# Patient Record
Sex: Female | Born: 1990 | Race: White | Hispanic: No | Smoking: Never smoker
Health system: Southern US, Community
[De-identification: ages and names within clinical notes are randomized; demographics above are authoritative.]

## PROBLEM LIST (undated history)

## (undated) DIAGNOSIS — U071 COVID-19: Secondary | ICD-10-CM

## (undated) DIAGNOSIS — Z8639 Personal history of other endocrine, nutritional and metabolic disease: Secondary | ICD-10-CM

## (undated) DIAGNOSIS — M1990 Unspecified osteoarthritis, unspecified site: Secondary | ICD-10-CM

## (undated) DIAGNOSIS — M069 Rheumatoid arthritis, unspecified: Secondary | ICD-10-CM

## (undated) DIAGNOSIS — Z8742 Personal history of other diseases of the female genital tract: Secondary | ICD-10-CM

## (undated) DIAGNOSIS — F419 Anxiety disorder, unspecified: Secondary | ICD-10-CM

## (undated) DIAGNOSIS — Z8659 Personal history of other mental and behavioral disorders: Secondary | ICD-10-CM

## (undated) DIAGNOSIS — F332 Major depressive disorder, recurrent severe without psychotic features: Secondary | ICD-10-CM

## (undated) HISTORY — PX: BUNIONECTOMY: SHX129

## (undated) HISTORY — DX: Personal history of other mental and behavioral disorders: Z86.59

## (undated) HISTORY — DX: Personal history of other diseases of the female genital tract: Z87.42

## (undated) HISTORY — DX: Personal history of other endocrine, nutritional and metabolic disease: Z86.39

## (undated) HISTORY — DX: Unspecified osteoarthritis, unspecified site: M19.90

## (undated) HISTORY — DX: Rheumatoid arthritis, unspecified: M06.9

## (undated) HISTORY — DX: Anxiety disorder, unspecified: F41.9

---

## 2016-04-09 ENCOUNTER — Encounter: Payer: Self-pay | Admitting: Rheumatology

## 2016-04-19 ENCOUNTER — Telehealth: Payer: Self-pay | Admitting: Rheumatology

## 2016-04-19 NOTE — Telephone Encounter (Signed)
Labs received from [facility] WNL, called patient to advise, sent for scanning since they are [facility] and I can not pull these up

## 2016-04-19 NOTE — Telephone Encounter (Signed)
Left message for her, I do not have the lab results, I have also called [facility]

## 2016-04-19 NOTE — Telephone Encounter (Signed)
Pt called about lab results from [facility] visit on 04/09/16.

## 2016-05-05 ENCOUNTER — Other Ambulatory Visit: Payer: Self-pay | Admitting: Rheumatology

## 2016-05-05 ENCOUNTER — Ambulatory Visit: Payer: Self-pay | Admitting: Rheumatology

## 2016-05-05 ENCOUNTER — Telehealth: Payer: Self-pay | Admitting: Radiology

## 2016-05-05 MED ORDER — METHOTREXATE SODIUM CHEMO INJECTION 50 MG/2ML: 20.0000 mg | INTRAMUSCULAR | 0 refills | Status: DC

## 2016-05-05 NOTE — Telephone Encounter (Signed)
Last visit 02/03/16 Next visit not scheduled, message sent Labs  12/14/15  labs past due for MTX  Will call patient

## 2016-05-05 NOTE — Telephone Encounter (Signed)
Patient was denied a refill for MTX; I scheduled her an appointment in January. She was called on 04/19/16 by our office stating her labs were normal Pt would [facility] [place].

## 2016-05-05 NOTE — Telephone Encounter (Signed)
Thank you, I have now sent the labs to be scanned  [facility] to refill per Dr Corliss Skains

## 2016-05-05 NOTE — Telephone Encounter (Signed)
Pt states I called her on 04/19/16 with normal lab results/ this was our go live date in epic  I can not locate labs. Will have to call [facility] then can send for scanning

## 2016-05-26 ENCOUNTER — Other Ambulatory Visit: Payer: Self-pay | Admitting: Rheumatology

## 2016-05-26 NOTE — Telephone Encounter (Signed)
I have called patient to discuss.  Left message for her to call me back

## 2016-05-26 NOTE — Telephone Encounter (Signed)
Will you review? 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

## 2016-05-26 NOTE — Telephone Encounter (Signed)
That the Celebrex use was only for tendinitis. She should discontinue the medication or use when necessary only

## 2016-05-27 ENCOUNTER — Other Ambulatory Visit: Payer: Self-pay | Admitting: Rheumatology

## 2016-05-27 NOTE — Telephone Encounter (Signed)
Patient returned Amy's phone call. Patient states she is at work and can talk at 66 or 3. Thank you.

## 2016-05-27 NOTE — Telephone Encounter (Signed)
Labs WNL 04/19/16 Last visit  02/03/16 Next visit 07/12/16

## 2016-05-27 NOTE — Telephone Encounter (Signed)
I spoke to patient and she does only when she needs it. She is aware this with MTX long term can affect kidney function. Ok to refill per Dr Corliss Skains

## 2016-06-01 ENCOUNTER — Other Ambulatory Visit: Payer: Self-pay | Admitting: Rheumatology

## 2016-06-02 ENCOUNTER — Telehealth: Payer: Self-pay | Admitting: Rheumatology

## 2016-06-02 NOTE — Telephone Encounter (Signed)
Contacted [facility] and provided new fax number and they are to fax labs.

## 2016-06-02 NOTE — Telephone Encounter (Signed)
Patient states she had her labs done at [facility] on 10/13 and they have tried to fax to [place]. [facility] states patient can't give them our new fax, we have to call them, [phone]

## 2016-06-03 NOTE — Telephone Encounter (Signed)
ok 

## 2016-06-03 NOTE — Telephone Encounter (Signed)
Last Visit: 02/03/16 Next Visit: 07/12/16 Labs: 04/09/16 WNL except elevated glucose  Okay to refill MTX?

## 2016-07-12 ENCOUNTER — Ambulatory Visit: Payer: Self-pay | Admitting: Rheumatology

## 2016-07-20 ENCOUNTER — Ambulatory Visit: Payer: Self-pay | Admitting: Rheumatology

## 2016-07-29 ENCOUNTER — Ambulatory Visit: Payer: Self-pay | Admitting: Rheumatology

## 2016-08-11 ENCOUNTER — Telehealth: Payer: Self-pay | Admitting: Rheumatology

## 2016-08-11 ENCOUNTER — Other Ambulatory Visit: Payer: Self-pay | Admitting: *Deleted

## 2016-08-11 ENCOUNTER — Other Ambulatory Visit: Payer: Self-pay | Admitting: Rheumatology

## 2016-08-11 DIAGNOSIS — Z79899 Other long term (current) drug therapy: Secondary | ICD-10-CM

## 2016-08-11 DIAGNOSIS — Z9225 Personal history of immunosupression therapy: Secondary | ICD-10-CM

## 2016-08-11 NOTE — Telephone Encounter (Addendum)
Last Visit: 02/03/16 Next Visit: 09/01/16 Labs: 04/09/16 Elevated Glucose TB Gold: 08/18/15 Neg  Left message for patient requesting updated labs. Lab order has been placed for patient to update lab.  Okay to refill 30 day supply Humira?

## 2016-08-11 NOTE — Telephone Encounter (Signed)
Left message to advise patient lab order has been placed and released. Patient advised to contact the office with fax number if lab is unable to see the order.

## 2016-08-11 NOTE — Telephone Encounter (Signed)
Patient called and stated that she needs a new lab order for [facility] in [place] so that she can get labs done up there.  [phone].  Thank you.

## 2016-08-12 NOTE — Telephone Encounter (Signed)
Since labs are past due; we can only give 30 day supply;  At f-u in march 7, we can get labs and give 90 day supply

## 2016-08-14 LAB — CBC WITH DIFFERENTIAL/PLATELET
BASOS ABS: 0 10*3/uL (ref 0.0–0.2)
Basos: 0 %
EOS (ABSOLUTE): 0 10*3/uL (ref 0.0–0.4)
Eos: 0 %
Hematocrit: 35.8 % (ref 34.0–46.6)
Hemoglobin: 12 g/dL (ref 11.1–15.9)
IMMATURE GRANS (ABS): 0 10*3/uL (ref 0.0–0.1)
Immature Granulocytes: 0 %
LYMPHS: 19 %
Lymphocytes Absolute: 1.4 10*3/uL (ref 0.7–3.1)
MCH: 30.5 pg (ref 26.6–33.0)
MCHC: 33.5 g/dL (ref 31.5–35.7)
MCV: 91 fL (ref 79–97)
Monocytes Absolute: 0.8 10*3/uL (ref 0.1–0.9)
Monocytes: 11 %
NEUTROS ABS: 5 10*3/uL (ref 1.4–7.0)
NEUTROS PCT: 70 %
Platelets: 304 10*3/uL (ref 150–379)
RBC: 3.94 x10E6/uL (ref 3.77–5.28)
RDW: 13.3 % (ref 12.3–15.4)
WBC: 7.2 10*3/uL (ref 3.4–10.8)

## 2016-08-15 ENCOUNTER — Other Ambulatory Visit: Payer: Self-pay | Admitting: Rheumatology

## 2016-08-16 ENCOUNTER — Telehealth: Payer: Self-pay | Admitting: Rheumatology

## 2016-08-16 NOTE — Telephone Encounter (Signed)
Patient called and wanted to know the results of her lab work.  [phone].  Thank you.

## 2016-08-16 NOTE — Telephone Encounter (Signed)
Last Visit: 02/03/16 Next Visit: 09/01/16 Labs: 08/13/16 WNL  Okay to refill Celebrex?

## 2016-08-16 NOTE — Progress Notes (Signed)
Informed patient CBC with differential is normalTB gold

## 2016-08-17 ENCOUNTER — Other Ambulatory Visit: Payer: Self-pay | Admitting: *Deleted

## 2016-08-17 DIAGNOSIS — Z79899 Other long term (current) drug therapy: Secondary | ICD-10-CM

## 2016-08-17 LAB — QUANTIFERON IN TUBE
QFT TB AG MINUS NIL VALUE: 0.01 [IU]/mL
QUANTIFERON MITOGEN VALUE: 6.42 IU/mL
QUANTIFERON TB AG VALUE: 0.03 [IU]/mL
QUANTIFERON TB GOLD: NEGATIVE
Quantiferon Nil Value: 0.02 IU/mL

## 2016-08-17 LAB — QUANTIFERON TB GOLD ASSAY (BLOOD)

## 2016-08-17 NOTE — Telephone Encounter (Signed)
Patient advised of lab results

## 2016-09-01 ENCOUNTER — Ambulatory Visit: Payer: Self-pay | Admitting: Rheumatology

## 2016-09-08 ENCOUNTER — Other Ambulatory Visit (INDEPENDENT_AMBULATORY_CARE_PROVIDER_SITE_OTHER): Payer: Self-pay | Admitting: *Deleted

## 2016-09-08 DIAGNOSIS — Z79899 Other long term (current) drug therapy: Secondary | ICD-10-CM

## 2016-09-08 LAB — COMPLETE METABOLIC PANEL WITH GFR
ALT: 19 U/L (ref 6–29)
AST: 18 U/L (ref 10–30)
Albumin: 4.1 g/dL (ref 3.6–5.1)
Alkaline Phosphatase: 62 U/L (ref 33–115)
BUN: 11 mg/dL (ref 7–25)
CHLORIDE: 102 mmol/L (ref 98–110)
CO2: 28 mmol/L (ref 20–31)
CREATININE: 0.67 mg/dL (ref 0.50–1.10)
Calcium: 9.6 mg/dL (ref 8.6–10.2)
GFR, Est Non African American: >89 mL/min (ref >=60)
Glucose, Bld: 97 mg/dL (ref 65–99)
Potassium: 4.5 mmol/L (ref 3.5–5.3)
Sodium: 138 mmol/L (ref 135–146)
Total Bilirubin: 0.3 mg/dL (ref 0.2–1.2)
Total Protein: 7.6 g/dL (ref 6.1–8.1)

## 2016-09-09 NOTE — Progress Notes (Signed)
Normal CMP

## 2016-09-13 ENCOUNTER — Other Ambulatory Visit: Payer: Self-pay | Admitting: Rheumatology

## 2016-09-13 ENCOUNTER — Encounter: Payer: Self-pay | Admitting: *Deleted

## 2016-09-13 ENCOUNTER — Ambulatory Visit (INDEPENDENT_AMBULATORY_CARE_PROVIDER_SITE_OTHER): Payer: BLUE CROSS/BLUE SHIELD | Admitting: Rheumatology

## 2016-09-13 ENCOUNTER — Telehealth: Payer: Self-pay | Admitting: Rheumatology

## 2016-09-13 DIAGNOSIS — M65341 Trigger finger, right ring finger: Secondary | ICD-10-CM | POA: Diagnosis not present

## 2016-09-13 MED ORDER — TRIAMCINOLONE ACETONIDE 40 MG/ML IJ SUSP
10.0000 mg | INTRAMUSCULAR | Status: AC | PRN
  Administered 2016-09-13: 10 mg

## 2016-09-13 MED ORDER — LIDOCAINE HCL 1 % IJ SOLN
0.5000 mL | INTRAMUSCULAR | Status: AC | PRN
  Administered 2016-09-13: .5 mL

## 2016-09-13 NOTE — Progress Notes (Signed)
   Procedure Note  Patient: Shannon David             Date of Birth: September 15, 1990           MRN: [id number]             Visit Date: 09/13/2016  Procedures: Visit Diagnoses: Right fourth trigger finger  Hand/UE Inj Date/Time: 09/13/2016 12:07 PM Performed by: Pollyann Savoy Authorized by: Pollyann Savoy   Consent Given by:  Patient Site marked: the procedure site was marked   Timeout: prior to procedure the correct patient, procedure, and site was verified   Indications:  Therapeutic and tendon swelling Condition: trigger finger   Location:  Ring finger Site:  R ring A1 Prep: patient was prepped and draped in usual sterile fashion   Needle Size:  27 G Approach:  Volar Ultrasound Guidance: Yes   Medications:  0.5 mL lidocaine 1 %; 10 mg triamcinolone acetonide 40 MG/ML Aspirate amount (mL):  0 Patient tolerance:  Patient tolerated the procedure well with no immediate complications  Her right fourth finger was splinted. She was also given a work excuse to not to use her right hand for the next 4 days.   Pollyann Savoy, MD, Aileen Pilot

## 2016-09-13 NOTE — Telephone Encounter (Signed)
Okay to schedule patient for 10:30 this morning

## 2016-09-13 NOTE — Telephone Encounter (Signed)
Last Visit: 02/03/16 Next Visit: 09/22/16 Labs: 08/13/16 WNL TB Gold: 08/13/16 Neg  Okay to refill Humira?

## 2016-09-13 NOTE — Telephone Encounter (Signed)
I called the patient back and she is coming in at 10:30.

## 2016-09-13 NOTE — Telephone Encounter (Signed)
Patient called wanting to get into Dr. Fatima Sanger schedule for trigger finger of her right ring finger.  [phone].  Thank you.

## 2016-09-21 DIAGNOSIS — Z8639 Personal history of other endocrine, nutritional and metabolic disease: Secondary | ICD-10-CM | POA: Insufficient documentation

## 2016-09-21 DIAGNOSIS — Z79899 Other long term (current) drug therapy: Secondary | ICD-10-CM | POA: Insufficient documentation

## 2016-09-21 DIAGNOSIS — Z8659 Personal history of other mental and behavioral disorders: Secondary | ICD-10-CM | POA: Insufficient documentation

## 2016-09-21 DIAGNOSIS — M0579 Rheumatoid arthritis with rheumatoid factor of multiple sites without organ or systems involvement: Secondary | ICD-10-CM | POA: Insufficient documentation

## 2016-09-21 NOTE — Progress Notes (Signed)
Office Visit Note  Patient: Shannon David             Date of Birth: 1990/10/16           MRN: [id number]             PCP: Otila Back Referring: No ref. provider found Visit Date: 09/22/2016 Occupation: @GUAROCC @    Subjective:  Follow-up   History of Present Illness: Khanya Colunga is a 26 y.o. female   History of rheumatoid arthritis. Recently seen for trigger finger on the right hand and was injected successfully by Dr. Corliss Skains using ultrasound guidance. Patient reports that she is doing really well. She did follow aftercare advice and has done well.  Patient currently is having a lot of nausea after she takes her methotrexate.  She complains that she has flulike symptoms and last for multiple days. In the past this was not an issue. She's taking 0.8 ML's of injectable methotrexate once a week on Fridays. Folic acid 2 mg daily.  Also taking Humira every other week.  Her joints are doing well overall except her right second MCP always has been a source of pain and problems. Currently her feet are hurting and at times they cramp and at times the toes are curling up. She does have a podiatrist who gave her an insert several years ago but it was not effective and so patient ended up throwing it away.  We discussed other measures and patient is agreeable to try and anti-emetics. If that is not successful, we have discussed decreasing the dosage of her methotrexate. Currently we don't want to do that, she has aggressive rheumatoid arthritis that needs tight control.  Activities of Daily Living:  Patient reports morning stiffness for 30 minutes.   Patient Reports nocturnal pain.  Difficulty dressing/grooming: Denies Difficulty climbing stairs: Reports Difficulty getting out of chair: Denies Difficulty using hands for taps, buttons, cutlery, and/or writing: Reports   Review of Systems  Constitutional: Negative for fatigue.  HENT: Negative for mouth sores and  mouth dryness.   Eyes: Negative for dryness.  Respiratory: Negative for shortness of breath.   Gastrointestinal: Negative for constipation and diarrhea.  Musculoskeletal: Negative for myalgias and myalgias.  Skin: Negative for sensitivity to sunlight.  Psychiatric/Behavioral: Negative for decreased concentration and sleep disturbance.    PMFS History:  Patient Active Problem List   Diagnosis Date Noted  . High risk medication use 09/21/2016  . Rheumatoid arthritis involving multiple sites with positive rheumatoid factor (HCC) 09/21/2016  . History of hypothyroidism 09/21/2016  . History of depression 09/21/2016    Past Medical History:  Diagnosis Date  . Arthritis     No family history on file. Past Surgical History:  Procedure Laterality Date  . BUNIONECTOMY Right    Social History   Social History Narrative  . No narrative on file     Objective: Vital Signs: BP 120/80   Pulse 80   Resp 14   Ht 5\' 3"  (1.6 m)   Wt 220 lb (99.8 kg)   LMP 09/21/2016   BMI 38.97 kg/m    Physical Exam  Constitutional: She is oriented to person, place, and time. She appears well-developed and well-nourished.  HENT:  Head: Normocephalic and atraumatic.  Eyes: EOM are normal. Pupils are equal, round, and reactive to light.  Cardiovascular: Normal rate, regular rhythm and normal heart sounds.  Exam reveals no gallop and no friction rub.   No murmur heard. Pulmonary/Chest: Effort normal and breath  sounds normal. She has no wheezes. She has no rales.  Abdominal: Soft. Bowel sounds are normal. She exhibits no distension. There is no tenderness. There is no guarding. No hernia.  Musculoskeletal: Normal range of motion. She exhibits deformity (ulnar deviation R>L MCP's.). She exhibits no edema or tenderness.  Lymphadenopathy:    She has no cervical adenopathy.  Neurological: She is alert and oriented to person, place, and time. Coordination normal.  Skin: Skin is warm and dry. Capillary  refill takes less than 2 seconds. No rash noted.  Psychiatric: She has a normal mood and affect. Her behavior is normal.  Nursing note and vitals reviewed.    Musculoskeletal Exam:  Full range of motion of all joints Grip strength is equal and strong bilaterally Fibromyalgia tender points are all absent  CDAI Exam: CDAI Homunculus Exam:   Tenderness:  Right hand: 2nd MCP  Swelling:  Right hand: 2nd MCP  Joint Counts:  CDAI Tender Joint count: 1 CDAI Swollen Joint count: 1  Global Assessments:  Patient Global Assessment: 5 Provider Global Assessment: 5  CDAI Calculated Score: 12 Significant ulnar deviation of the right greater than the left hand. Ongoing synovial thickening of the right second MCP joint.  Evaluation of bilateral feet reveal early hammertoe and right greater than left. She has old surgical scar at the medial aspect of her right great toe at the MTP joint.   Investigation: Findings:  June 2017:  CBC was normal, comprehensive metabolic panel was normal, RAPID 3 was 1.0, which was near remission  synovial thickening.  And mild deviation of bilateral hands, left fifth MCP shows some synovial thickening  TB-Gold was negative in February of 2017  Orders Only on 09/08/2016  Component Date Value Ref Range Status  . Sodium 09/08/2016 138  135 - 146 mmol/L Final  . Potassium 09/08/2016 4.5  3.5 - 5.3 mmol/L Final  . Chloride 09/08/2016 102  98 - 110 mmol/L Final  . CO2 09/08/2016 28  20 - 31 mmol/L Final  . Glucose, Bld 09/08/2016 97  65 - 99 mg/dL Final  . BUN 20/08/7942 11  7 - 25 mg/dL Final  . Creat 46/19/0122 0.67  0.50 - 1.10 mg/dL Final  . Total Bilirubin 09/08/2016 0.3  0.2 - 1.2 mg/dL Final  . Alkaline Phosphatase 09/08/2016 62  33 - 115 U/L Final  . AST 09/08/2016 18  10 - 30 U/L Final  . ALT 09/08/2016 19  6 - 29 U/L Final  . Total Protein 09/08/2016 7.6  6.1 - 8.1 g/dL Final  . Albumin 24/04/4642 4.1  3.6 - 5.1 g/dL Final  . Calcium  14/27/6701 9.6  8.6 - 10.2 mg/dL Final  . GFR, Est African American 09/08/2016 >89  >=60 mL/min Final  . GFR, Est Non African American 09/08/2016 >89  >=60 mL/min Final  Orders Only on 08/11/2016  Component Date Value Ref Range Status  . WBC 08/13/2016 7.2  3.4 - 10.8 x10E3/uL Final  . RBC 08/13/2016 3.94  3.77 - 5.28 x10E6/uL Final  . Hemoglobin 08/13/2016 12.0  11.1 - 15.9 g/dL Final  . Hematocrit 03/30/4960 35.8  34.0 - 46.6 % Final  . MCV 08/13/2016 91  79 - 97 fL Final  . MCH 08/13/2016 30.5  26.6 - 33.0 pg Final  . MCHC 08/13/2016 33.5  31.5 - 35.7 g/dL Final  . RDW 16/43/5391 13.3  12.3 - 15.4 % Final  . Platelets 08/13/2016 304  150 - 379 x10E3/uL Final  . Neutrophils 08/13/2016  70  Not Estab. % Final  . Lymphs 08/13/2016 19  Not Estab. % Final  . Monocytes 08/13/2016 11  Not Estab. % Final  . Eos 08/13/2016 0  Not Estab. % Final  . Basos 08/13/2016 0  Not Estab. % Final  . Neutrophils Absolute 08/13/2016 5.0  1.4 - 7.0 x10E3/uL Final  . Lymphocytes Absolute 08/13/2016 1.4  0.7 - 3.1 x10E3/uL Final  . Monocytes Absolute 08/13/2016 0.8  0.1 - 0.9 x10E3/uL Final  . EOS (ABSOLUTE) 08/13/2016 0.0  0.0 - 0.4 x10E3/uL Final  . Basophils Absolute 08/13/2016 0.0  0.0 - 0.2 x10E3/uL Final  . Immature Granulocytes 08/13/2016 0  Not Estab. % Final  . Immature Grans (Abs) 08/13/2016 0.0  0.0 - 0.1 x10E3/uL Final  . QUANTIFERON INCUBATION 08/13/2016 Comment   Final  . QUANTIFERON TB GOLD 08/13/2016 Negative  Negative Final  . QUANTIFERON CRITERIA 08/13/2016 Comment   Final   Comment: To be considered positive a specimen should have a TB Ag minus Nil value greater than or equal to 0.35 IU/mL and in addition the TB Ag minus Nil value must be greater than or equal to 25% of the Nil value. There may be insufficient information in these values to differentiate between some negative and some indeterminate test values.   . QUANTIFERON TB AG VALUE 08/13/2016 0.03  IU/mL Final  . Quantiferon  Nil Value 08/13/2016 0.02  IU/mL Final  . QUANTIFERON MITOGEN VALUE 08/13/2016 6.42  IU/mL Final  . QFT TB AG MINUS NIL VALUE 08/13/2016 0.01  IU/mL Final  . Interpretation: 08/13/2016 Comment   Final   Comment: The QuantiFERON TB Gold (in Tube) assay is intended for use as an aid in the diagnosis of TB infection. Negative results suggest that there is no TB infection. In patients with high suspicion of exposure, a negative test should be repeated. A positive test indicates infection with Mycobacterium tuberculosis. Among individuals without tuberculosis infection, a positive test may be due to exposure to M. kansasii, M. szulgai or M. marinum. On the Internet, go to [email or web address].[email or web address] for further details.       Imaging: No results found.  Speciality Comments: No specialty comments available.    Procedures:  No procedures performed Allergies: Keflex [cephalexin]   Assessment / Plan:     Visit Diagnoses: Rheumatoid arthritis involving multiple sites with positive rheumatoid factor (HCC) - +CCP,no synovitis on examination.    High risk medication use - Humira Pen- 40mg /0.8ML PNKT  (40 MG) SUBCUTANEOUSLY q other weekMTX- 50mg /2ML injection  INJECT 0.8 ML SUBCUTANEOUSLY weekly  Pain in joints of both feet  History of hypothyroidism  History of depression   Plan: #1: Rheumatoid arthritis. Right greater than left ulnar deviation.  #2 taking Humira every 2 weeks  and injectable methotrexate 0.8 ML's  and folic acid 2 mg daily  Currently having 2-3 days of nausea and feeling like she has flulike symptoms after taking methotrexate. We will try Zofran 4 mg 3 and post methotrexate injection to see if this relieves patient's symptoms.  #3: If patient fails to improve with this above treatment option, I plan to decrease her methotrexate to 0.7 mL's per week in the future.  #4: Trigger finger to the right hand has improved after injection given in 09/13/2016 with ultrasound guidance  from Dr. .  #5: Bilateral feet pain. early hammertoe. Discussed inserts for bilateral shoes as well as metatarsal pads. Prescription written and patient will go to [facility] on [address].  Orders: No  orders of the defined types were placed in this encounter.  Meds ordered this encounter  Medications  . ondansetron (ZOFRAN) 4 MG tablet    Sig: Take 1 tablet (4 mg total) by mouth every 8 (eight) hours as needed for nausea or vomiting.    Dispense:  30 tablet    Refill:  2    Order Specific Question:   Supervising Provider    Answer:   Pollyann Savoy [phone]    Face-to-face time spent with patient was 30 minutes. 50% of time was spent in counseling and coordination of care.  Follow-Up Instructions: Return in about 5 months (around 02/22/2017).   Tawni Pummel, PA-C  Note - This record has been created using [employer].  Chart creation errors have been sought, but may not always  have been located. Such creation errors do not reflect on  the standard of medical care.

## 2016-09-22 ENCOUNTER — Ambulatory Visit (INDEPENDENT_AMBULATORY_CARE_PROVIDER_SITE_OTHER): Payer: BLUE CROSS/BLUE SHIELD | Admitting: Rheumatology

## 2016-09-22 ENCOUNTER — Encounter: Payer: Self-pay | Admitting: Rheumatology

## 2016-09-22 VITALS — BP 120/80 | HR 80 | Resp 14 | Ht 63.0 in | Wt 220.0 lb

## 2016-09-22 DIAGNOSIS — M0579 Rheumatoid arthritis with rheumatoid factor of multiple sites without organ or systems involvement: Secondary | ICD-10-CM | POA: Diagnosis not present

## 2016-09-22 DIAGNOSIS — Z79899 Other long term (current) drug therapy: Secondary | ICD-10-CM

## 2016-09-22 DIAGNOSIS — Z8639 Personal history of other endocrine, nutritional and metabolic disease: Secondary | ICD-10-CM | POA: Diagnosis not present

## 2016-09-22 DIAGNOSIS — M25571 Pain in right ankle and joints of right foot: Secondary | ICD-10-CM

## 2016-09-22 DIAGNOSIS — M79671 Pain in right foot: Secondary | ICD-10-CM

## 2016-09-22 DIAGNOSIS — M79672 Pain in left foot: Secondary | ICD-10-CM

## 2016-09-22 DIAGNOSIS — M25572 Pain in left ankle and joints of left foot: Secondary | ICD-10-CM

## 2016-09-22 DIAGNOSIS — Z8659 Personal history of other mental and behavioral disorders: Secondary | ICD-10-CM

## 2016-09-22 MED ORDER — ONDANSETRON HCL 4 MG PO TABS: 4.0000 mg | ORAL_TABLET | Freq: Three times a day (TID) | ORAL | 2 refills | Status: AC | PRN

## 2016-09-28 ENCOUNTER — Telehealth: Payer: Self-pay

## 2016-09-28 NOTE — Telephone Encounter (Signed)
Received a fax from [facility] regarding a prior authorization approval for Celebrex from 08/29/2016 to 09/28/2017.   Reference number:[id number]  Will send documents to scan center.  Left message for patient to call back.   Michaeljoseph Revolorio, [place], CPhT 2:37 PM

## 2016-09-28 NOTE — Telephone Encounter (Signed)
Patient returned call. Let her know that Celebrex has been approved through April 2019. She voices understanding and denies any other questions at this time.   Tymar Polyak, [place], CPhT 3:31 PM

## 2016-11-10 ENCOUNTER — Telehealth: Payer: Self-pay | Admitting: Rheumatology

## 2016-11-10 NOTE — Telephone Encounter (Signed)
I spoke to patient who reports she is having bad seasonal allergies at this time and she was considering going to a walk in clinic for her allergies.  She wanted to know if it would be okay to get a Kenalog injection if it was offered.  I reviewed patient's medications and do not see any interaction with her current medications and Kenalog.  I counseled patient that if the clinic feels she has an infection, she should hold Humria and methotrexate during active infection.  Patient voiced understanding and denies any further questions at this time.   Shannon David, Pharm.D., BCPS, CPP Clinical Pharmacist Pager: [phone] Phone: [phone] 11/10/2016 12:41 PM

## 2016-11-10 NOTE — Telephone Encounter (Signed)
Patient takes Humira and MTX. Allergies are really bad right now, and she usually gets Kenalog. Is that okay to take with other meds. Please call to advise.

## 2016-12-08 ENCOUNTER — Telehealth: Payer: Self-pay | Admitting: Rheumatology

## 2016-12-08 ENCOUNTER — Telehealth: Payer: Self-pay | Admitting: Radiology

## 2016-12-08 ENCOUNTER — Other Ambulatory Visit: Payer: Self-pay | Admitting: Radiology

## 2016-12-08 DIAGNOSIS — Z79899 Other long term (current) drug therapy: Secondary | ICD-10-CM

## 2016-12-08 NOTE — Telephone Encounter (Signed)
Ok to switch to tablets. Her dose will be 8 tabs po q week as she is on 0.8 ml sq qwk

## 2016-12-08 NOTE — Telephone Encounter (Signed)
Patient called wanting to know if her MTX can be changed from the injections to the pill form.  She is having trouble giving herself the injections.  [phone].  Thank you.

## 2016-12-08 NOTE — Telephone Encounter (Signed)
Noted, called patient to advise. Left message for her to come in for labs/ next Rx will be tablets.

## 2016-12-08 NOTE — Telephone Encounter (Signed)
Patient due for labs for MTX She has trouble injecting wants to change to tablets Please advise, if she updates labs, ok to change over to tablets ?

## 2016-12-08 NOTE — Progress Notes (Signed)
Patient states she will [facility] tomorrow for labs/ have released labs to [facility]

## 2016-12-08 NOTE — Telephone Encounter (Signed)
Sue Lush. Please see messages about MTX Dr Corliss Skains has agreed patient may change to tablets 8 per week with next refill, but labs due  Patient states she will go tomorrow for the labs/ I released orders to [facility]   If labs normal, ok to send in the MTX 8 pills once per week. See me if you have questions.

## 2016-12-10 LAB — CMP14+EGFR
A/G RATIO: 1.4 (ref 1.2–2.2)
ALK PHOS: 75 IU/L (ref 39–117)
ALT: 14 IU/L (ref 0–32)
AST: 17 IU/L (ref 0–40)
Albumin: 4.5 g/dL (ref 3.5–5.5)
BUN/Creatinine Ratio: 22 (ref 9–23)
BUN: 13 mg/dL (ref 6–20)
Bilirubin Total: 0.4 mg/dL (ref 0.0–1.2)
CO2: 22 mmol/L (ref 20–29)
Calcium: 9.3 mg/dL (ref 8.7–10.2)
Chloride: 100 mmol/L (ref 96–106)
Creatinine, Ser: 0.59 mg/dL (ref 0.57–1.00)
GFR calc non Af Amer: 126 mL/min/{1.73_m2} (ref >59)
GFR, EST AFRICAN AMERICAN: 145 mL/min/{1.73_m2} (ref >59)
Globulin, Total: 3.2 g/dL (ref 1.5–4.5)
Glucose: 88 mg/dL (ref 65–99)
POTASSIUM: 4.5 mmol/L (ref 3.5–5.2)
Sodium: 137 mmol/L (ref 134–144)
TOTAL PROTEIN: 7.7 g/dL (ref 6.0–8.5)

## 2016-12-10 NOTE — Progress Notes (Signed)
Please inform patient her CMP with GFR is normal.Send a copy to her PCP.

## 2016-12-13 MED ORDER — METHOTREXATE 2.5 MG PO TABS: 20.0000 mg | ORAL_TABLET | ORAL | 0 refills | Status: DC

## 2016-12-13 NOTE — Telephone Encounter (Signed)
Attempted to contact patient and left message for patient to call the office.  

## 2016-12-13 NOTE — Addendum Note (Signed)
Addended by: Henriette Combs on: 12/13/2016 04:10 PM   Modules accepted: Orders

## 2016-12-13 NOTE — Telephone Encounter (Signed)
Left message to advise patient okay to go ahead and increase dose to 8 tabs per week of MTX. Prescription sent to the pharmacy.

## 2016-12-13 NOTE — Telephone Encounter (Signed)
Patient has had a CMP done and it was normal, lab did not perform CBC. Patient will go to lab to have that done tomorrow. Patient has been advised of CMP results. Do you want to go ahead and increase the MTX dose or wait on the CBC results?

## 2016-12-13 NOTE — Telephone Encounter (Signed)
Okay to increase the methotrexate dose

## 2016-12-22 ENCOUNTER — Other Ambulatory Visit: Payer: Self-pay

## 2016-12-22 ENCOUNTER — Other Ambulatory Visit: Payer: Self-pay | Admitting: *Deleted

## 2016-12-22 DIAGNOSIS — Z79899 Other long term (current) drug therapy: Secondary | ICD-10-CM

## 2016-12-22 LAB — CBC WITH DIFFERENTIAL/PLATELET
BASOS PCT: 0 %
Basophils Absolute: 0 cells/uL (ref 0–200)
EOS PCT: 0 %
Eosinophils Absolute: 0 cells/uL — ABNORMAL LOW (ref 15–500)
HEMATOCRIT: 35.6 % (ref 35.0–45.0)
HEMOGLOBIN: 12 g/dL (ref 11.7–15.5)
LYMPHS ABS: 620 {cells}/uL — AB (ref 850–3900)
Lymphocytes Relative: 10 %
MCH: 30.4 pg (ref 27.0–33.0)
MCHC: 33.7 g/dL (ref 32.0–36.0)
MCV: 90.1 fL (ref 80.0–100.0)
MONO ABS: 62 {cells}/uL — AB (ref 200–950)
MPV: 10 fL (ref 7.5–12.5)
Monocytes Relative: 1 %
Neutro Abs: 5518 cells/uL (ref 1500–7800)
Neutrophils Relative %: 89 %
Platelets: 276 10*3/uL (ref 140–400)
RBC: 3.95 MIL/uL (ref 3.80–5.10)
RDW: 13.3 % (ref 11.0–15.0)
WBC: 6.2 10*3/uL (ref 3.8–10.8)

## 2016-12-22 NOTE — Progress Notes (Signed)
Labs are stable.

## 2017-01-12 ENCOUNTER — Ambulatory Visit: Payer: BLUE CROSS/BLUE SHIELD | Admitting: Sports Medicine

## 2017-02-17 NOTE — Progress Notes (Signed)
Office Visit Note  Patient: Shannon David             Date of Birth: October 25, 1990           MRN: [id number]             PCP: Eunice Blase, PA-C Referring: Eunice Blase, PA-C Visit Date: 02/22/2017 Occupation: @GUAROCC @    Subjective:  Pain hands.   History of Present Illness: Shannon David is a 26 y.o. female with history of sero positive rheumatoid arthritis. She states some she's been on overall methotrexate since the last visit in March. She her compliance has been better since she's taking oral methotrexate. She states her subcutaneous Humira is given to her by her boyfriend. She has not missed any dosage per patient. She continues to have some pain and swelling in her hands. She's been able to tolerate nausea from methotrexate with Zofran. Her feet are painful as well.   Activities of Daily Living:  Patient reports morning stiffness for 5 hours.   Patient Denies nocturnal pain.  Difficulty dressing/grooming: Denies Difficulty climbing stairs: Reports Difficulty getting out of chair: Denies Difficulty using hands for taps, buttons, cutlery, and/or writing: Denies   Review of Systems  Constitutional: Negative for fatigue, night sweats, weight gain, weight loss and weakness.  HENT: Negative for mouth sores, trouble swallowing, trouble swallowing, mouth dryness and nose dryness.   Eyes: Negative for pain, redness, visual disturbance and dryness.  Respiratory: Negative for cough, shortness of breath and difficulty breathing.   Cardiovascular: Negative for chest pain, palpitations, hypertension, irregular heartbeat and swelling in legs/feet.  Gastrointestinal: Negative for blood in stool, constipation and diarrhea.  Endocrine: Negative for increased urination.  Genitourinary: Negative for vaginal dryness.  Musculoskeletal: Positive for arthralgias, joint pain, joint swelling and morning stiffness. Negative for myalgias, muscle weakness, muscle tenderness and myalgias.  Skin:  Negative for color change, rash, hair loss, skin tightness, ulcers and sensitivity to sunlight.  Allergic/Immunologic: Negative for susceptible to infections.  Neurological: Negative for dizziness, memory loss and night sweats.  Hematological: Negative for swollen glands.  Psychiatric/Behavioral: Negative for depressed mood and sleep disturbance. The patient is not nervous/anxious.     PMFS History:  Patient Active Problem List   Diagnosis Date Noted  . History of PCOS 02/21/2017  . High risk medication use 09/21/2016  . Rheumatoid arthritis involving multiple sites with positive rheumatoid factor (HCC) 09/21/2016  . History of hypothyroidism 09/21/2016  . History of depression 09/21/2016    Past Medical History:  Diagnosis Date  . Arthritis     Family History  Problem Relation Age of Onset  . Hypertension Mother   . Clotting disorder Mother    Past Surgical History:  Procedure Laterality Date  . BUNIONECTOMY Right    Social History   Social History Narrative  . No narrative on file     Objective: Vital Signs: BP 124/80 (BP Location: Left Arm, Patient Position: Sitting, Cuff Size: Normal)   Pulse 78   Ht 5\' 3"  (1.6 m)   Wt 220 lb (99.8 kg)   BMI 38.97 kg/m    Physical Exam  Constitutional: She is oriented to person, place, and time. She appears well-developed and well-nourished.  HENT:  Head: Normocephalic and atraumatic.  Eyes: Conjunctivae and EOM are normal.  Neck: Normal range of motion.  Cardiovascular: Normal rate, regular rhythm, normal heart sounds and intact distal pulses.   Pulmonary/Chest: Effort normal and breath sounds normal.  Abdominal: Soft. Bowel sounds are normal.  Lymphadenopathy:    She has no cervical adenopathy.  Neurological: She is alert and oriented to person, place, and time.  Skin: Skin is warm and dry. Capillary refill takes less than 2 seconds.  Psychiatric: She has a normal mood and affect. Her behavior is normal.  Nursing note  and vitals reviewed.    Musculoskeletal Exam: C-spine and thoracic lumbar spine good range of motion. Shoulder joints elbow joints are good range of motion. She has limited range of motion in her wrist joints. She also has synovial thickening and mild synovitis over her MCP joints which is described below. Hip joints knee joints ankles are good range of motion. She is tenderness across her MTP joints.  CDAI Exam: CDAI Homunculus Exam:   Tenderness:  Right hand: 2nd MCP and 3rd MCP Left hand: 2nd MCP and 3rd MCP Right foot: 2nd MTP, 3rd MTP and 4th MTP Left foot: 2nd MTP, 3rd MTP, 4th MTP and 5th MTP  Swelling:  Right hand: 2nd MCP and 3rd MCP Left hand: 2nd MCP and 3rd MCP  Joint Counts:  CDAI Tender Joint count: 4 CDAI Swollen Joint count: 4  Global Assessments:  Patient Global Assessment: 7 Provider Global Assessment: 5  CDAI Calculated Score: 20 v   Investigation: Findings:  08/13/2016 negative TB gold    CBC Latest Ref Rng & Units 12/22/2016 08/13/2016  WBC 3.8 - 10.8 K/uL 6.2 7.2  Hemoglobin 11.7 - 15.5 g/dL 68.0 32.1  Hematocrit 22.4 - 45.0 % 35.6 35.8  Platelets 140 - 400 K/uL 276 304   CMP Latest Ref Rng & Units 12/09/2016 09/08/2016  Glucose 65 - 99 mg/dL 88 97  BUN 6 - 20 mg/dL 13 11  Creatinine 8.25 - 1.00 mg/dL 0.03 7.04  Sodium 888 - 144 mmol/L 137 138  Potassium 3.5 - 5.2 mmol/L 4.5 4.5  Chloride 96 - 106 mmol/L 100 102  CO2 20 - 29 mmol/L 22 28  Calcium 8.7 - 10.2 mg/dL 9.3 9.6  Total Protein 6.0 - 8.5 g/dL 7.7 7.6  Total Bilirubin 0.0 - 1.2 mg/dL 0.4 0.3  Alkaline Phos 39 - 117 IU/L 75 62  AST 0 - 40 IU/L 17 18  ALT 0 - 32 IU/L 14 19   Imaging: No results found.  Speciality Comments: No specialty comments available.    Procedures:  No procedures performed Allergies: Keflex [cephalexin]   Assessment / Plan:     Visit Diagnoses: Rheumatoid arthritis involving multiple sites with positive rheumatoid factor (HCC) - Positive RF, positive  anti-CCP, erosive disease with contractures. She continues to have active disease with pain and stiffness in her bilateral hands and feet. She has synovial thickening and mild synovitis in her hands. She's been on Humira for a long time now. She was switched to oral methotrexate due to noncompliance with subcutaneous methotrexate. She has needle phobia. We had detailed discussion regarding different treatment options. I believe she will do better on oral medication like Xeljanz or Baricitinib. Indications contraindications side effects were discussed. She will need baseline lipid panel which can be done with her next standing order. She will have to stop Humira 2 weeks prior to starting [place]. Severe apply for  High risk medication use - Humira 40 mg subcutaneous every other week and Methotrexate 8 tablets by mouth every week, folic acid 2 mg by mouth daily, On contrceptive tt - Plan: CBC with Differential/Platelet, COMPLETE METABOLIC PANEL WITH GFR, Lipid panel  Association of heart disease with rheumatoid arthritis was discussed. Need  to monitor blood pressure, cholesterol, and to exercise 30-60 minutes on daily basis was discussed. Poor dental hygiene can be a predisposing factor for rheumatoid arthritis. Good dental hygiene was discussed.  History of hypothyroidism  History of PCOS    Orders: Orders Placed This Encounter  Procedures  . CBC with Differential/Platelet  . COMPLETE METABOLIC PANEL WITH GFR  . Lipid panel   No orders of the defined types were placed in this encounter.   Face-to-face time spent with patient was 30 minutes. Greater than 50% of time was spent in counseling and coordination of care.  Follow-Up Instructions: Return in about 3 months (around 05/25/2017) for Rheumatoid arthritis.   Pollyann Savoy, MD  Note - This record has been created using Animal nutritionist.  Chart creation errors have been sought, but may not always  have been located. Such creation  errors do not reflect on  the standard of medical care.

## 2017-02-21 DIAGNOSIS — Z8742 Personal history of other diseases of the female genital tract: Secondary | ICD-10-CM | POA: Insufficient documentation

## 2017-02-22 ENCOUNTER — Telehealth: Payer: Self-pay

## 2017-02-22 ENCOUNTER — Encounter: Payer: Self-pay | Admitting: Rheumatology

## 2017-02-22 ENCOUNTER — Ambulatory Visit (INDEPENDENT_AMBULATORY_CARE_PROVIDER_SITE_OTHER): Payer: BLUE CROSS/BLUE SHIELD | Admitting: Rheumatology

## 2017-02-22 VITALS — BP 124/80 | HR 78 | Ht 63.0 in | Wt 220.0 lb

## 2017-02-22 DIAGNOSIS — M0579 Rheumatoid arthritis with rheumatoid factor of multiple sites without organ or systems involvement: Secondary | ICD-10-CM | POA: Diagnosis not present

## 2017-02-22 DIAGNOSIS — Z8742 Personal history of other diseases of the female genital tract: Secondary | ICD-10-CM | POA: Diagnosis not present

## 2017-02-22 DIAGNOSIS — Z8639 Personal history of other endocrine, nutritional and metabolic disease: Secondary | ICD-10-CM | POA: Diagnosis not present

## 2017-02-22 DIAGNOSIS — Z79899 Other long term (current) drug therapy: Secondary | ICD-10-CM | POA: Diagnosis not present

## 2017-02-22 NOTE — Patient Instructions (Addendum)
Tofacitinib extended-release tablets What is this medicine? TOFACITINIB (TOE fa SYE it nib) is a medicine that works on the immune system. This medicine is used to treat rheumatoid arthritis and psoriatic arthritis. This medicine may be used for other purposes; ask your health care provider or pharmacist if you have questions. COMMON BRAND NAME(S): [name] XR What should I tell my health care provider before I take this medicine? They need to know if you have any of these conditions: -cancer -diabetes -high cholesterol -immune system problems -infection (especially a virus infection such as chickenpox, cold sores, or herpes) -kidney disease -liver disease -low blood counts, like low white cell, platelet, or red cell counts -stomach or intestine problems -an unusual or allergic reaction to tofacitinib, other medicines, foods, dyes, or preservatives -pregnant or trying to get pregnant -breast-feeding How should I use this medicine? Take this medicine by mouth with a glass of water. Follow the directions on the prescription label. Do not cut, crush or chew this medicine. You can take it with or without food. If it upsets your stomach, take it with food. Take your medicine at regular intervals. Do not take it more often than directed. Do not stop taking except on your doctor's advice. A special MedGuide will be given to you by the pharmacist with each prescription and refill. Be sure to read this information carefully each time. Talk to your pediatrician regarding the use of this medicine in children. Special care may be needed. Overdosage: If you think you have taken too much of this medicine contact a poison control center or emergency room at once. NOTE: This medicine is only for you. Do not share this medicine with others. What if I miss a dose? If you miss a dose, take it as soon as you can. If it is almost time for your next dose, take only that dose. Do not take double or extra  doses. What may interact with this medicine? Do not take this medicine with any of the following medications: -azathioprine, cyclosporine, or other immunosuppressive drugs -biologic medicines for arthritis such as abatacept, adalimumab, anakinra, certolizumab, etanercept, golimumab, infliximab, rituximab, secukinumab, tocilizumab, ustekinumab -live vaccines This medicine may also interact with the following medications: -antiviral medicines for hepatitis, HIV or AIDS -certain medicines for fungal infections like fluconazole, itraconazole, ketoconazole, voriconazole -certain medicines for seizures like carbamazepine, phenobarbital, phenytoin -rifampin -supplements, such as St. John's wort This list may not describe all possible interactions. Give your health care provider a list of all the medicines, herbs, non-prescription drugs, or dietary supplements you use. Also tell them if you smoke, drink alcohol, or use illegal drugs. Some items may interact with your medicine. What should I watch for while using this medicine? Tell your doctor or healthcare professional if your symptoms do not start to get better or if they get worse. The tablet shell of this medicine does not dissolve. This is normal. The tablet shell may appear whole in the stool. This is not a cause for concern. Avoid taking products that contain aspirin, acetaminophen, ibuprofen, naproxen, or ketoprofen unless instructed by your doctor. These medicines may hide a fever. Call your doctor or health care professional for advice if you get a fever, chills or sore throat, or other symptoms of a cold or flu. Do not treat yourself. This drug decreases your body's ability to fight infections. Try to avoid being around people who are sick. What side effects may I notice from receiving this medicine? Side effects that you should report  to your doctor or health care professional as soon as possible: -allergic reactions like skin rash, itching  or hives, swelling of the face, lips, or tongue -breathing problems -dizziness -signs of infection - fever or chills, cough, sore throat, pain or trouble passing urine -signs and symptoms of liver injury like dark yellow or brown urine; general ill feeling or flu-like symptoms; light-colored stools; loss of appetite; nausea; right upper belly pain; unusually weak or tired; yellowing of the eyes or skin -stomach pain or a sudden change in bowel habits -unusually weak or tired Side effects that usually do not require medical attention (report to your doctor or health care professional if they continue or are bothersome): -diarrhea -headache -muscle aches -runny nose -sinus trouble This list may not describe all possible side effects. Call your doctor for medical advice about side effects. You may report side effects to FDA at 1-800-FDA-1088. Where should I keep my medicine? Keep out of the reach of children. Store between 20 and 25 degrees C (68 and 77 degrees F). Throw away any unused medicine after the expiration date. NOTE: This sheet is a summary. It may not cover all possible information. If you have questions about this medicine, talk to your doctor, pharmacist, or health care provider.  2018 Elsevier/Gold Standard (2016-07-02 11:55:35) Association of heart disease with rheumatoid arthritis was discussed. Need to monitor blood pressure, cholesterol, and to exercise 30-60 minutes on daily basis was discussed. Poor dental hygiene can be a predisposing factor for rheumatoid arthritis. Good dental hygiene was discussed.

## 2017-02-22 NOTE — Telephone Encounter (Addendum)
Submitted a prior authorization for Xeljanz 5 mgs BID over the phone with insurance. Spoke to Will who states that the medication is a non-preferred medication. Patient must try and fail Enbrel, Humira and Kevzara first. Patient has tried Enbrel (08/2013-10/2013) and has been on Humira since 11/2014. We can submit an appeal for further review for Xeljanz.   Reference number: [id number] Phone: [phone]  Would you like to submit an appeal for Shannon David? Thanks!  Gamaliel Charney, [place], CPhT 11:06 AM

## 2017-02-23 ENCOUNTER — Telehealth: Payer: Self-pay | Admitting: Rheumatology

## 2017-02-23 NOTE — Telephone Encounter (Signed)
Patient calling on update on approval for Xeljanx. Please call to advise.

## 2017-02-23 NOTE — Telephone Encounter (Signed)
Patient returned call. Gave her the update and informed her that we would submit an appeal on her behalf.   Will contact the patient when we have a response.   Harlin Mazzoni, [place], CPhT 3:44 PM

## 2017-02-23 NOTE — Telephone Encounter (Signed)
Called patient to inform her about the denial and to let her know that we will process an appeal. Left message for patient to call back.

## 2017-02-24 ENCOUNTER — Encounter: Payer: Self-pay | Admitting: *Deleted

## 2017-02-24 NOTE — Telephone Encounter (Signed)
She has been taking Humira without any issues. She has inadequate response to Humira. The needle phobia and noncompliance was to subcutaneous methotrexate.

## 2017-02-24 NOTE — Telephone Encounter (Signed)
According to your last office note: She's been on Humira for a long time now. She was switched to oral methotrexate due to noncompliance with subcutaneous methotrexate. She has needle phobia. We had detailed discussion regarding different treatment options.  Is this consider a failure or inadequate response to Humira? Is the patient having non compliance due to needle phobia?

## 2017-02-25 ENCOUNTER — Encounter: Payer: Self-pay | Admitting: *Deleted

## 2017-02-25 NOTE — Telephone Encounter (Signed)
Appeal letter completed and faxed to insurance.

## 2017-03-03 ENCOUNTER — Encounter: Payer: Self-pay | Admitting: Rheumatology

## 2017-03-04 ENCOUNTER — Telehealth: Payer: Self-pay

## 2017-03-04 NOTE — Telephone Encounter (Signed)
Apply for Orencia clickjet

## 2017-03-04 NOTE — Telephone Encounter (Signed)
Will apply for Orencia per Dr Corliss Skains

## 2017-03-04 NOTE — Telephone Encounter (Signed)
A prior authorization for Orencia Clickjet has been submitted to patient's insurance via cover my meds. Will update once we receive a response.  Shannon David, [place], CPhT 3:42 PM

## 2017-03-04 NOTE — Telephone Encounter (Signed)
Patient returned call. Appeal is still being processed. It can take up to 30 days before we hear a response. Appeal was submitted on 8/29. Patient voices understanding. She states that she is hurting pretty badly and wondering if she should consider another medication. We will contact the patient once we receive a response.   Preferred medications on patients plan are: Enbrel, Humira, Kavzara and Orencia Clickjet. Patient has failed Enbrel and Humira. Please address. Thanks!  Nishaan Stanke, [place], CPhT 1:06 PM

## 2017-03-04 NOTE — Addendum Note (Signed)
Addended byCaffie Damme on: 03/04/2017 03:39 PM   Modules accepted: Orders

## 2017-03-04 NOTE — Telephone Encounter (Signed)
Called [facility] to check the status of patients appeal for Shannon David. Spoke with Lay who states that the authorization is still pending. Appeals can take up to 30 days.   Phone number: [phone] Reference number: none  Called patient to update her. Left message on voicemail to call back.   Boots Mcglown, [place], CPhT 11:01 AM

## 2017-03-07 MED ORDER — ABATACEPT 125 MG/ML ~~LOC~~ SOAJ: 125.0000 mg | SUBCUTANEOUS | 0 refills | Status: DC

## 2017-03-07 NOTE — Telephone Encounter (Signed)
Prescription for Orencia sent to the pharmacy. Left message for patient to call the office.

## 2017-03-07 NOTE — Addendum Note (Signed)
Addended by: Henriette Combs on: 03/07/2017 02:52 PM   Modules accepted: Orders

## 2017-03-07 NOTE — Telephone Encounter (Signed)
Patient advised Shannon David has been approved an [facility]. Patient states she took her Humira 03/06/17. Patient advised we would have to wait 2 weeks to start her on Orencia. Patient verbalized understanding. Patient advised her first shipment of her medication would be delivered to the office and thereafter would be deliver to her home. Patient states she will schedule nurse visit when we call to advise her we have the medication in office.

## 2017-03-07 NOTE — Telephone Encounter (Signed)
Received a fax from [facility] regarding an appeal DENIAL for Shannon David because it was determined that the request is not medically necessary or is experimental or investigational.   A decision was decided to change to Orencia which was approved on 03/04/17.  Reference number: [id number]  Will send document to scan center.  Erbie Arment, [place], CPhT 8:33 AM

## 2017-03-07 NOTE — Telephone Encounter (Signed)
Received a fax from [facility] regarding a prior authorization approval for Orencia SQ from 03/04/17 to 03/05/2019.   Reference [id number]  Will send document to scan center.  Patient is currently on Humira and will need to stop 2 weeks prior to starting new medicaiton per office visit not form 02/22/17. Patient will need to schedule a nursing visit for the initial dose of medication. Will you schedule the visit? Thanks!  Arianni Gallego, [place], CPhT 8:28 AM

## 2017-03-14 ENCOUNTER — Telehealth: Payer: Self-pay | Admitting: Rheumatology
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
## 2017-03-17 ENCOUNTER — Telehealth: Payer: Self-pay

## 2017-03-17 NOTE — Telephone Encounter (Signed)
Received patients Orencia from specialty pharmacy. Meds have been placed in refrigerator. Please schedule nursing visit. Thanks!  Shannon David, [place], CPhT 12:59 PM

## 2017-03-17 NOTE — Telephone Encounter (Signed)
Patient advised her Dub Amis is in the office. Patient last had her Humira approximately 03/08/17. Patient scheduled for 03/23/17 for new start to Orencia.

## 2017-03-23 ENCOUNTER — Ambulatory Visit (INDEPENDENT_AMBULATORY_CARE_PROVIDER_SITE_OTHER): Payer: BLUE CROSS/BLUE SHIELD | Admitting: *Deleted

## 2017-03-23 ENCOUNTER — Other Ambulatory Visit: Payer: Self-pay | Admitting: *Deleted

## 2017-03-23 VITALS — BP 119/83 | HR 90

## 2017-03-23 DIAGNOSIS — M0579 Rheumatoid arthritis with rheumatoid factor of multiple sites without organ or systems involvement: Secondary | ICD-10-CM | POA: Diagnosis not present

## 2017-03-23 DIAGNOSIS — Z79899 Other long term (current) drug therapy: Secondary | ICD-10-CM

## 2017-03-23 LAB — COMPLETE METABOLIC PANEL WITH GFR
AG Ratio: 1.3 (calc) (ref 1.0–2.5)
ALT: 16 U/L (ref 6–29)
AST: 15 U/L (ref 10–30)
Albumin: 4.1 g/dL (ref 3.6–5.1)
Alkaline phosphatase (APISO): 65 U/L (ref 33–115)
BILIRUBIN TOTAL: 0.4 mg/dL (ref 0.2–1.2)
BUN: 15 mg/dL (ref 7–25)
CALCIUM: 9 mg/dL (ref 8.6–10.2)
CHLORIDE: 101 mmol/L (ref 98–110)
CO2: 26 mmol/L (ref 20–32)
CREATININE: 0.68 mg/dL (ref 0.50–1.10)
GFR, EST AFRICAN AMERICAN: 138 mL/min/{1.73_m2} (ref >=60)
GFR, Est Non African American: 119 mL/min/{1.73_m2} (ref >=60)
Globulin: 3.2 g/dL (calc) (ref 1.9–3.7)
Glucose, Bld: 87 mg/dL (ref 65–99)
Potassium: 4.2 mmol/L (ref 3.5–5.3)
Sodium: 136 mmol/L (ref 135–146)
TOTAL PROTEIN: 7.3 g/dL (ref 6.1–8.1)

## 2017-03-23 LAB — CBC WITH DIFFERENTIAL/PLATELET
BASOS PCT: 0.2 %
Basophils Absolute: 10 cells/uL (ref 0–200)
EOS PCT: 0 %
Eosinophils Absolute: 0 cells/uL — ABNORMAL LOW (ref 15–500)
HCT: 35 % (ref 35.0–45.0)
Hemoglobin: 11.9 g/dL (ref 11.7–15.5)
Lymphs Abs: 1224 cells/uL (ref 850–3900)
MCH: 30.7 pg (ref 27.0–33.0)
MCHC: 34 g/dL (ref 32.0–36.0)
MCV: 90.2 fL (ref 80.0–100.0)
MONOS PCT: 10.6 %
MPV: 10.7 fL (ref 7.5–12.5)
NEUTROS ABS: 3325 {cells}/uL (ref 1500–7800)
Neutrophils Relative %: 65.2 %
PLATELETS: 254 10*3/uL (ref 140–400)
RBC: 3.88 10*6/uL (ref 3.80–5.10)
RDW: 12.9 % (ref 11.0–15.0)
TOTAL LYMPHOCYTE: 24 %
WBC mixed population: 541 cells/uL (ref 200–950)
WBC: 5.1 10*3/uL (ref 3.8–10.8)

## 2017-03-23 LAB — LIPID PANEL
CHOL/HDL RATIO: 3.5 (calc) (ref <5.0)
CHOLESTEROL: 166 mg/dL (ref <200)
HDL: 48 mg/dL — ABNORMAL LOW (ref >50)
LDL Cholesterol (Calc): 98 mg/dL (calc)
Non-HDL Cholesterol (Calc): 118 mg/dL (calc) (ref <130)
Triglycerides: 106 mg/dL (ref <150)

## 2017-03-23 MED ORDER — ABATACEPT 125 MG/ML ~~LOC~~ SOAJ
125.0000 mg | Freq: Once | SUBCUTANEOUS | Status: AC
  Administered 2017-03-23: 125 mg via SUBCUTANEOUS

## 2017-03-23 NOTE — Progress Notes (Signed)
Patient in office today for a new start to Orencia. Patient is switching from Humira to Orencia and states it has been over two weeks since her last Humira injection. Patient states she has no infection at this time. Patient was taught the proper technique to administer IOencia. Patient provided medication and it was given in her left thigh. Patient tolerated injection well. Patient was monitored in office for 30 minutes for adverse reactions. No adverse reactions noted.   Administrations This Visit    Abatacept SOAJ 125 mg    Admin Date 03/23/2017 Action Given Dose 125 mg Route Subcutaneous Administered By Henriette Combs, LPN

## 2017-03-24 NOTE — Progress Notes (Signed)
WNLs

## 2017-03-30 ENCOUNTER — Ambulatory Visit (INDEPENDENT_AMBULATORY_CARE_PROVIDER_SITE_OTHER): Payer: BLUE CROSS/BLUE SHIELD | Admitting: Rheumatology

## 2017-03-30 ENCOUNTER — Encounter: Payer: Self-pay | Admitting: Rheumatology

## 2017-03-30 ENCOUNTER — Telehealth: Payer: Self-pay | Admitting: Rheumatology

## 2017-03-30 VITALS — BP 131/85 | HR 82 | Resp 14 | Ht 63.0 in | Wt 215.0 lb

## 2017-03-30 DIAGNOSIS — M25561 Pain in right knee: Secondary | ICD-10-CM

## 2017-03-30 DIAGNOSIS — Z8639 Personal history of other endocrine, nutritional and metabolic disease: Secondary | ICD-10-CM

## 2017-03-30 DIAGNOSIS — Z79899 Other long term (current) drug therapy: Secondary | ICD-10-CM | POA: Diagnosis not present

## 2017-03-30 DIAGNOSIS — M0579 Rheumatoid arthritis with rheumatoid factor of multiple sites without organ or systems involvement: Secondary | ICD-10-CM

## 2017-03-30 DIAGNOSIS — Z8659 Personal history of other mental and behavioral disorders: Secondary | ICD-10-CM

## 2017-03-30 MED ORDER — PREDNISONE 5 MG PO TABS: ORAL_TABLET | ORAL | 0 refills | Status: DC

## 2017-03-30 MED ORDER — LIDOCAINE HCL 1 % IJ SOLN
1.5000 mL | INTRAMUSCULAR | Status: AC | PRN
Start: 2017-03-30 — End: 2017-03-30
  Administered 2017-03-30: 1.5 mL

## 2017-03-30 MED ORDER — TRIAMCINOLONE ACETONIDE 40 MG/ML IJ SUSP
40.0000 mg | INTRAMUSCULAR | Status: AC | PRN
  Administered 2017-03-30: 40 mg via INTRA_ARTICULAR

## 2017-03-30 NOTE — Telephone Encounter (Signed)
Patient has been scheduled for 03/30/17 at 10:15 am

## 2017-03-30 NOTE — Telephone Encounter (Signed)
Per patient she has started Orencia, but has had several flares since. Patient has missed four days at work due to pain. Patient asking to come in for an injection in rt knee due to pain. Patient due to take Orencia today, and patient needs to return to work tomorrow, Please call to advise.

## 2017-03-30 NOTE — Progress Notes (Signed)
Office Visit Note  Patient: Shannon David             Date of Birth: January 08, 1991           MRN: [id number]             PCP: Eunice Blase, PA-C Referring: Eunice Blase, PA-C Visit Date: 03/30/2017 Occupation: @GUAROCC @    Subjective:  Right knee pain.   History of Present Illness: Shannon David is a 26 y.o. female with sero positive rheumatoid arthritis she was seen today on urgent basis due to severe pain and discomfort in her right knee. She was switched from Humira to Orencia last week due to inadequate response to Humira. She states she's been having pain and discomfort in multiple joints for the last few weeks. Her right knee joint has been extremely painful since she woke up this morning. She's having difficulty walking.  Activities of Daily Living:  Patient reports morning stiffness for all day hours.   Patient Denies nocturnal pain.  Difficulty dressing/grooming: Denies Difficulty climbing stairs: Reports Difficulty getting out of chair: Reports Difficulty using hands for taps, buttons, cutlery, and/or writing: Denies   Review of Systems  Constitutional: Positive for fatigue. Negative for night sweats, weight gain, weight loss and weakness.  HENT: Negative for mouth sores, trouble swallowing, trouble swallowing, mouth dryness and nose dryness.   Eyes: Negative for pain, redness, visual disturbance and dryness.  Respiratory: Negative for cough, shortness of breath and difficulty breathing.   Cardiovascular: Negative for chest pain, palpitations, hypertension, irregular heartbeat and swelling in legs/feet.  Gastrointestinal: Negative for blood in stool, constipation and diarrhea.  Endocrine: Negative for increased urination.  Genitourinary: Negative for vaginal dryness.  Musculoskeletal: Positive for arthralgias, joint pain, joint swelling and morning stiffness. Negative for myalgias, muscle weakness, muscle tenderness and myalgias.  Skin: Negative for color change,  rash, hair loss, skin tightness, ulcers and sensitivity to sunlight.  Allergic/Immunologic: Negative for susceptible to infections.  Neurological: Negative for dizziness, memory loss and night sweats.  Hematological: Negative for swollen glands.  Psychiatric/Behavioral: Positive for depressed mood and sleep disturbance. The patient is not nervous/anxious.     PMFS History:  Patient Active Problem List   Diagnosis Date Noted  . History of PCOS 02/21/2017  . High risk medication use 09/21/2016  . Rheumatoid arthritis involving multiple sites with positive rheumatoid factor (HCC) 09/21/2016  . History of hypothyroidism 09/21/2016  . History of depression 09/21/2016    Past Medical History:  Diagnosis Date  . Arthritis     Family History  Problem Relation Age of Onset  . Hypertension Mother   . Clotting disorder Mother    Past Surgical History:  Procedure Laterality Date  . BUNIONECTOMY Right    Social History   Social History Narrative  . No narrative on file     Objective: Vital Signs: BP 131/85 (BP Location: Left Arm, Patient Position: Sitting, Cuff Size: Normal)   Pulse 82   Resp 14   Ht 5\' 3"  (1.6 m)   Wt 215 lb (97.5 kg)   LMP 03/06/2017   BMI 38.09 kg/m    Physical Exam  Constitutional: She is oriented to person, place, and time. She appears well-developed and well-nourished.  HENT:  Head: Normocephalic and atraumatic.  Eyes: Conjunctivae and EOM are normal.  Neck: Normal range of motion.  Cardiovascular: Normal rate, regular rhythm, normal heart sounds and intact distal pulses.   Pulmonary/Chest: Effort normal and breath sounds normal.  Abdominal: Soft. Bowel  sounds are normal.  Lymphadenopathy:    She has no cervical adenopathy.  Neurological: She is alert and oriented to person, place, and time.  Skin: Skin is warm and dry. Capillary refill takes less than 2 seconds.  Psychiatric: She has a normal mood and affect. Her behavior is normal.  Nursing note  and vitals reviewed.    Musculoskeletal Exam: C-spine and thoracic lumbar spine good range of motion. Shoulder joints elbow joints wrist joints with good range of motion. She MCP thickening with ulnar deviation. No synovitis was noted. She is warmth and swelling in her right knee joint. Although joints afford range of motion with no synovitis.   CDAI Exam: CDAI Homunculus Exam:   Tenderness:  Left hand: 4th PIP RLE: tibiofemoral  Joint Counts:  CDAI Tender Joint count: 2 CDAI Swollen Joint count: 0  Global Assessments:  Patient Global Assessment: 8 Provider Global Assessment: 5  CDAI Calculated Score: 15    Investigation: No additional findings.   Imaging: No results found.  Speciality Comments: No specialty comments available.    Procedures:  Large Joint Inj Date/Time: 03/30/2017 11:26 AM Performed by: Pollyann Savoy Authorized by: Pollyann Savoy   Consent Given by:  Patient Site marked: the procedure site was marked   Timeout: prior to procedure the correct patient, procedure, and site was verified   Indications:  Pain and joint swelling Location:  Knee Site:  R knee Prep: patient was prepped and draped in usual sterile fashion   Needle Size:  27 G Needle Length:  1.5 inches Approach:  Medial Ultrasound Guidance: No   Fluoroscopic Guidance: No   Arthrogram: No   Medications:  1.5 mL lidocaine 1 %; 40 mg triamcinolone acetonide 40 MG/ML Aspiration Attempted: Yes   Aspirate amount (mL):  0 Patient tolerance:  Patient tolerated the procedure well with no immediate complications    Allergies: Keflex [cephalexin]   Assessment / Plan:     Visit Diagnoses: Rheumatoid arthritis involving multiple sites with positive rheumatoid factor (HCC) - Positive RF, positive anti-CCP, erosive disease with contractures. Patient was seen on urgent basis today due to severe pain and discomfort in her joints. She's having difficulty walking and limping due to right  knee joint pain. She states she had to miss her work today. She started Orencia last week and has not seen any benefit so far. I will give her prednisone as a bridging therapy the plan is to give her prednisone 5 mg she will take 20 mg for one week, 15 mg for one week, 10 mg for one week, 5 mg for one week, 2.5 mg for one week. Side effects of long-term prednisone use was discussed. I would like to observe how she does on Orencia and methotrexate combination. She does not want to take subcutaneous methotrexate due to fear of needles. Her preferred choice of medication will be Harriette Ohara due to fear of needles.  Acute pain and right knee: She has warmth and swelling in her right knee joint. After informed consent was obtain right knee joint was injected with cortisone as described above.  High risk medication use - Orencia subcutaneous every week(September , methotrexate 8 tablets by mouth every week, folic acid 2 tablets by mouth daily.(Enbrel, Humira inadequate response). Her labs have been stable.  Per the medical problems are listed as follows.  History of depression  History of hypothyroidism    Orders: Orders Placed This Encounter  Procedures  . Large Joint Injection/Arthrocentesis   Meds ordered this encounter  Medications  . predniSONE (DELTASONE) 5 MG tablet    Sig: 4 tabs po q AM x 7 days, 3 tabs po q AM   x 7 days, 2 tabs q AM  po x 7 days, 1 tab po q AM  x 7 days then 1/2 tab q AM  po x 7 days.    Dispense:  74 tablet    Refill:  0      Follow-Up Instructions: Return for Rheumatoid arthritis.   Pollyann Savoy, MD  Note - This record has been created using Animal nutritionist.  Chart creation errors have been sought, but may not always  have been located. Such creation errors do not reflect on  the standard of medical care.

## 2017-04-05 ENCOUNTER — Other Ambulatory Visit: Payer: Self-pay | Admitting: Rheumatology

## 2017-04-07 MED ORDER — METHOTREXATE 2.5 MG PO TABS: 20.0000 mg | ORAL_TABLET | ORAL | 0 refills | Status: DC

## 2017-04-07 NOTE — Telephone Encounter (Signed)
03/30/17 last visit Labs 03/23/17 CBC CMP  Next visit 05/25/17 Ok to refill per Dr Corliss Skains

## 2017-04-30 ENCOUNTER — Other Ambulatory Visit: Payer: Self-pay | Admitting: Rheumatology

## 2017-05-02 ENCOUNTER — Telehealth: Payer: Self-pay

## 2017-05-02 NOTE — Telephone Encounter (Signed)
Patient was returning Andrea's call.  CB#  [phone].  Please advise.  Thank You.

## 2017-05-02 NOTE — Telephone Encounter (Signed)
Patient states she did not request a refill for prednisone. Refusal sent to pharmacy for refill request.

## 2017-05-02 NOTE — Telephone Encounter (Signed)
Attempted to contact the patient and left message for patient to call the office.  

## 2017-05-02 NOTE — Telephone Encounter (Signed)
Patient states she did not request this refill.

## 2017-05-15 NOTE — Progress Notes (Signed)
Office Visit Note  Patient: Shannon David             Date of Birth: 1991-06-18           MRN: [id number]             PCP: Eunice Blase, PA-C Referring: Eunice Blase, PA-C Visit Date: 05/25/2017 Occupation: @GUAROCC @    Subjective:  Arthritis (doing better, left knee and hand pain )   History of Present Illness: Shannon David is a 25 y.o. female with history of rheumatoid arthritis. She was started on Orencia in September 2018. She has noticed improvement in her symptoms since she's been on Orencia subcutaneous. She's been also taking methotrexate 8 tablets per week. Recently she's been having some discomfort in her left knee joint she has not noticed any swelling. She reports that about 3 days ago she had stiffness and pain in her left third and fourth finger and she had difficulty making a fist. Which resolved after few hours.  Activities of Daily Living:  Patient reports morning stiffness for 2 hours.   Patient Reports nocturnal pain.  Difficulty dressing/grooming: Denies Difficulty climbing stairs: Denies Difficulty getting out of chair: Denies Difficulty using hands for taps, buttons, cutlery, and/or writing: Denies   Review of Systems  Constitutional: Negative for fatigue, night sweats, weight gain, weight loss and weakness.  HENT: Negative for mouth sores, trouble swallowing, trouble swallowing, mouth dryness and nose dryness.   Eyes: Negative for pain, redness, visual disturbance and dryness.  Respiratory: Negative for cough, shortness of breath and difficulty breathing.   Cardiovascular: Negative for chest pain, palpitations, hypertension, irregular heartbeat and swelling in legs/feet.  Gastrointestinal: Negative for blood in stool, constipation and diarrhea.  Endocrine: Negative for increased urination.  Genitourinary: Negative for vaginal dryness.  Musculoskeletal: Positive for arthralgias, joint pain and morning stiffness. Negative for joint swelling, myalgias,  muscle weakness, muscle tenderness and myalgias.  Skin: Negative for color change, rash, hair loss, skin tightness, ulcers and sensitivity to sunlight.  Allergic/Immunologic: Negative for susceptible to infections.  Neurological: Negative for dizziness, memory loss and night sweats.  Hematological: Negative for swollen glands.  Psychiatric/Behavioral: Negative for depressed mood and sleep disturbance. The patient is not nervous/anxious.     PMFS History:  Patient Active Problem List   Diagnosis Date Noted  . History of PCOS 02/21/2017  . High risk medication use 09/21/2016  . Rheumatoid arthritis involving multiple sites with positive rheumatoid factor (HCC) 09/21/2016  . History of hypothyroidism 09/21/2016  . History of depression 09/21/2016    Past Medical History:  Diagnosis Date  . Arthritis     Family History  Problem Relation Age of Onset  . Hypertension Mother   . Clotting disorder Mother    Past Surgical History:  Procedure Laterality Date  . BUNIONECTOMY Right    Social History   Social History Narrative  . Not on file     Objective: Vital Signs: BP 126/68 (BP Location: Left Arm, Patient Position: Sitting, Cuff Size: Normal)   Pulse 64   Resp 16   Ht 5\' 2"  (1.575 m)   Wt 227 lb (103 kg)   BMI 41.52 kg/m    Physical Exam  Constitutional: She is oriented to person, place, and time. She appears well-developed and well-nourished.  HENT:  Head: Normocephalic and atraumatic.  Eyes: Conjunctivae and EOM are normal.  Neck: Normal range of motion.  Cardiovascular: Normal rate, regular rhythm, normal heart sounds and intact distal pulses.  Pulmonary/Chest: Effort  normal and breath sounds normal.  Abdominal: Soft. Bowel sounds are normal.  Lymphadenopathy:    She has no cervical adenopathy.  Neurological: She is alert and oriented to person, place, and time.  Skin: Skin is warm and dry. Capillary refill takes less than 2 seconds.  Psychiatric: She has a  normal mood and affect. Her behavior is normal.  Nursing note and vitals reviewed.    Musculoskeletal Exam: C-spine, thoracic, and lumbar spine are good ROM.  Shoulders, elbows, and wrists good ROM with no discomfort.  Ulnar deviation of MCPs of bilateral hands. Bilateral 2nd and 3rd MCP synovial thickening. No synovitis noted.  Left knee warmth, no effusion.  Hip joints, knee joints, and ankles good ROM. Postsurgical change in right first MTP.  Hammer toes of all toes.  No synovitis.  High arches bilaterally.        CDAI Exam: CDAI Homunculus Exam:   Tenderness:  LLE: tibiofemoral  Joint Counts:  CDAI Tender Joint count: 1 CDAI Swollen Joint count: 0  Global Assessments:  Patient Global Assessment: 5   CDAI Calculated Score: 6    Investigation: No additional findings.Tb Gold: 08/13/2016 Negative  CBC Latest Ref Rng & Units 03/23/2017 12/22/2016 08/13/2016  WBC 3.8 - 10.8 Thousand/uL 5.1 6.2 7.2  Hemoglobin 11.7 - 15.5 g/dL 73.4 19.3 79.0  Hematocrit 35.0 - 45.0 % 35.0 35.6 35.8  Platelets 140 - 400 Thousand/uL 254 276 304   CMP Latest Ref Rng & Units 03/23/2017 12/09/2016 09/08/2016  Glucose 65 - 99 mg/dL 87 88 97  BUN 7 - 25 mg/dL 15 13 11   Creatinine 0.50 - 1.10 mg/dL 2.40 9.73 5.32  Sodium 135 - 146 mmol/L 136 137 138  Potassium 3.5 - 5.3 mmol/L 4.2 4.5 4.5  Chloride 98 - 110 mmol/L 101 100 102  CO2 20 - 32 mmol/L 26 22 28   Calcium 8.6 - 10.2 mg/dL 9.0 9.3 9.6  Total Protein 6.1 - 8.1 g/dL 7.3 7.7 7.6  Total Bilirubin 0.2 - 1.2 mg/dL 0.4 0.4 0.3  Alkaline Phos 39 - 117 IU/L - 75 62  AST 10 - 30 U/L 15 17 18   ALT 6 - 29 U/L 16 14 19     Imaging: Xr Foot 2 Views Left  Result Date: 05/25/2017 No joint space narrowing was noted. No chondrocalcinosis was noted. No patellofemoral narrowing was noted. Impression: X-ray of the knee joint was unremarkable.  Xr Foot 2 Views Right  Result Date: 05/25/2017 Right first MTP shows pin placement. Erosive changes were noted in all  MTP joints. Subluxation of all MCPs with joint space narrowing was noted. All PIP/DIP narrowing was noted. Juxta articular osteopenia was noted. No intertarsal joint space narrowing was noted. Impression: These signs are consistent with severe rheumatoid arthritis with erosive changes.  Xr Hand 2 View Left  Result Date: 05/25/2017 Juxta articular osteopenia was noted. First, second and third MCP joint narrowing was noted. Erosive changes were noted in the first MCP joint. Radiocarpal intercarpal and metacarpocarpal joint space narrowing was noted. All PIP joint space narrowing was noted. Impression: These findings are consistent with rheumatoid arthritis.  Xr Hand 2 View Right  Result Date: 05/25/2017 Ulnar deviation noted in all MCP joints. She had juxta articular osteopenia. Narrowing noted in the first second third fourth and fifth MCP joints. All PIP to joint narrowing was noted. Erosive changes were noted in the right first MCP joint and carpal joints. Radiocarpal, intercarpal, metacarpocarpal joint space narrowing was noted. Impression: These findings are consistent  with rheumatoid arthritis with erosive disease.  Xr Knee 3 View Left  Result Date: 05/25/2017 All MTP narrowing and subluxation was noted. Erosive changes were noted and second third fourth and fifth MTP joints. PIP/DIP narrowing was noted. No intercarpal joint space narrowing was noted. Impression: These findings are consistent with rheumatoid arthritis with erosive disease.  Xr Knee 3 View Right  Result Date: 05/25/2017 Mild medial compartment narrowing intercondylar osteophytes were noted. No chondrocalcinosis was noted. No patellofemoral narrowing was noted. 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   Speciality Comments: No specialty comments available.    Procedures:  Large Joint Inj: L knee on 05/25/2017 9:34 AM Indications: pain Details: 27 G 1.5 in needle, medial  approach  Arthrogram: No  Medications: 1.5 mL lidocaine 1 %; 40 mg triamcinolone acetonide 40 MG/ML Aspirate: 0 mL Outcome: tolerated well, no immediate complications Procedure, treatment alternatives, risks and benefits explained, specific risks discussed. Consent was given by the patient. Immediately prior to procedure a time out was called to verify the correct patient, procedure, equipment, support staff and site/side marked as required. Patient was prepped and draped in the usual sterile fashion.     Allergies: Keflex [cephalexin]   Assessment / Plan:     Visit Diagnoses: Rheumatoid arthritis involving multiple sites with positive rheumatoid factor (HCC) - Positive RF, positive anti-CCP, erosive disease with contractures. - Plan: XR Hand 2 View Right, XR Hand 2 View Left, she has intermittent swelling in her hands. She has severe arthritis in bilateral hands with no active synovitis on examination today. Her x-rays reveal ulnar deviation of all MCP joints with erosive changes as described above.  Pain knees: XR KNEE 3 VIEW RIGHT, XR KNEE 3 VIEW LEFT, x-rays revealed mild osteoarthritis no erosive changes were noted.  Chronic pain of left knee -she had warmth  in her left knee joint. Plan: Large Joint Inj: After informed consent was obtained left knee joint was prepped and strong fashion and injected with cortisone as described above. We will see response to combination therapy for right now.  Pain feet: She does not have any synovitis on examination although she have erosive changes in her bilateral feet with juxta articular osteopenia and severe arthritis. We will monitor x-rays closely over time. XR Foot 2 Views Right, XR Foot 2 Views Left  High risk medication use - Orencia subcutaneous every week(September , methotrexate 8 tablets by mouth every week, folic acid 2 tablets by mouth daily.(Enbrel, Humira inadequate response) - Plan: CBC with Differential/Platelet, COMPLETE METABOLIC  PANEL WITH GFR, COMPLETE METABOLIC PANEL WITH GFR, CBC with Differential/Platelet  History of hypothyroidism  History of depression  History of PCOS   Orders: Orders Placed This Encounter  Procedures  . Large Joint Inj: L knee  . XR Hand 2 View Right  . XR Hand 2 View Left  . XR KNEE 3 VIEW RIGHT  . XR KNEE 3 VIEW LEFT  . XR Foot 2 Views Right  . XR Foot 2 Views Left  . CBC with Differential/Platelet  . COMPLETE METABOLIC PANEL WITH GFR  . CBC with Differential/Platelet   No orders of the defined types were placed in this encounter.   Face-to-face time spent with patient was 30 minutes. Greater than 50% of time was spent in counseling and coordination of care.  Follow-Up Instructions: Return in about 4 months (around 09/22/2017) for Rheumatoid arthritis.   Pollyann Savoy, MD  Note - This record has been created using Animal nutritionist.  Chart creation errors have been sought, but may not always  have been located. Such creation errors do not reflect on  the standard of medical care.

## 2017-05-25 ENCOUNTER — Ambulatory Visit (INDEPENDENT_AMBULATORY_CARE_PROVIDER_SITE_OTHER): Payer: Self-pay

## 2017-05-25 ENCOUNTER — Encounter: Payer: Self-pay | Admitting: Rheumatology

## 2017-05-25 ENCOUNTER — Ambulatory Visit (INDEPENDENT_AMBULATORY_CARE_PROVIDER_SITE_OTHER): Payer: BLUE CROSS/BLUE SHIELD | Admitting: Rheumatology

## 2017-05-25 VITALS — BP 126/68 | HR 64 | Resp 16 | Ht 62.0 in | Wt 227.0 lb

## 2017-05-25 DIAGNOSIS — Z8742 Personal history of other diseases of the female genital tract: Secondary | ICD-10-CM | POA: Diagnosis not present

## 2017-05-25 DIAGNOSIS — M25562 Pain in left knee: Secondary | ICD-10-CM | POA: Diagnosis not present

## 2017-05-25 DIAGNOSIS — Z8639 Personal history of other endocrine, nutritional and metabolic disease: Secondary | ICD-10-CM | POA: Diagnosis not present

## 2017-05-25 DIAGNOSIS — Z8659 Personal history of other mental and behavioral disorders: Secondary | ICD-10-CM

## 2017-05-25 DIAGNOSIS — Z79899 Other long term (current) drug therapy: Secondary | ICD-10-CM | POA: Diagnosis not present

## 2017-05-25 DIAGNOSIS — M0579 Rheumatoid arthritis with rheumatoid factor of multiple sites without organ or systems involvement: Secondary | ICD-10-CM

## 2017-05-25 DIAGNOSIS — G8929 Other chronic pain: Secondary | ICD-10-CM

## 2017-05-25 DIAGNOSIS — M25561 Pain in right knee: Secondary | ICD-10-CM

## 2017-05-25 LAB — COMPLETE METABOLIC PANEL WITH GFR
AG Ratio: 1.3 (calc) (ref 1.0–2.5)
ALBUMIN MSPROF: 4.2 g/dL (ref 3.6–5.1)
ALT: 11 U/L (ref 6–29)
AST: 13 U/L (ref 10–30)
Alkaline phosphatase (APISO): 62 U/L (ref 33–115)
BILIRUBIN TOTAL: 0.3 mg/dL (ref 0.2–1.2)
BUN: 14 mg/dL (ref 7–25)
CALCIUM: 9.6 mg/dL (ref 8.6–10.2)
CHLORIDE: 102 mmol/L (ref 98–110)
CO2: 26 mmol/L (ref 20–32)
Creat: 0.65 mg/dL (ref 0.50–1.10)
GFR, EST AFRICAN AMERICAN: 140 mL/min/{1.73_m2} (ref >=60)
GFR, EST NON AFRICAN AMERICAN: 121 mL/min/{1.73_m2} (ref >=60)
Globulin: 3.3 g/dL (calc) (ref 1.9–3.7)
Glucose, Bld: 78 mg/dL (ref 65–99)
Potassium: 4.5 mmol/L (ref 3.5–5.3)
Sodium: 136 mmol/L (ref 135–146)
TOTAL PROTEIN: 7.5 g/dL (ref 6.1–8.1)

## 2017-05-25 LAB — CBC WITH DIFFERENTIAL/PLATELET
BASOS PCT: 0.6 %
Basophils Absolute: 32 cells/uL (ref 0–200)
EOS ABS: 0 {cells}/uL — AB (ref 15–500)
Eosinophils Relative: 0 %
HCT: 35.8 % (ref 35.0–45.0)
HEMOGLOBIN: 12.4 g/dL (ref 11.7–15.5)
LYMPHS ABS: 1776 {cells}/uL (ref 850–3900)
MCH: 31.2 pg (ref 27.0–33.0)
MCHC: 34.6 g/dL (ref 32.0–36.0)
MCV: 90.2 fL (ref 80.0–100.0)
MPV: 10.7 fL (ref 7.5–12.5)
Monocytes Relative: 8.9 %
NEUTROS ABS: 3021 {cells}/uL (ref 1500–7800)
Neutrophils Relative %: 57 %
Platelets: 259 10*3/uL (ref 140–400)
RBC: 3.97 10*6/uL (ref 3.80–5.10)
RDW: 13.6 % (ref 11.0–15.0)
Total Lymphocyte: 33.5 %
WBC: 5.3 10*3/uL (ref 3.8–10.8)
WBCMIX: 472 {cells}/uL (ref 200–950)

## 2017-05-25 MED ORDER — TRIAMCINOLONE ACETONIDE 40 MG/ML IJ SUSP
40.0000 mg | INTRAMUSCULAR | Status: AC | PRN
  Administered 2017-05-25: 40 mg via INTRA_ARTICULAR

## 2017-05-25 MED ORDER — LIDOCAINE HCL 1 % IJ SOLN
1.5000 mL | INTRAMUSCULAR | Status: AC | PRN
  Administered 2017-05-25: 1.5 mL

## 2017-05-25 NOTE — Patient Instructions (Signed)
Standing Labs We placed an order today for your standing lab work.    Please come back and get your standing labs in February 2019 and every 3 months following.  We have open lab Monday through Friday from 8:30-11:30 AM and 1:30-4 PM at the office of Dr. Pollyann Savoy.   The office is located at [address], [place], [place] [address] No appointment is necessary.   Labs are drawn by [facility].  You may receive a bill from [facility] for your lab work. If you have any questions regarding directions or hours of operation,  please call [phone].

## 2017-05-26 ENCOUNTER — Other Ambulatory Visit: Payer: Self-pay | Admitting: Rheumatology

## 2017-05-26 NOTE — Telephone Encounter (Signed)
Last Visit: 05/25/17 Next Visit: 10/07/17 Labs: 05/25/17 stable TB Gold: 08/13/16 Neg  Okay to refill per Dr. Corliss Skains

## 2017-05-26 NOTE — Progress Notes (Signed)
Labs are stable.

## 2017-07-04 ENCOUNTER — Other Ambulatory Visit: Payer: Self-pay | Admitting: *Deleted

## 2017-07-04 MED ORDER — METHOTREXATE 2.5 MG PO TABS: 20.0000 mg | ORAL_TABLET | ORAL | 0 refills | Status: DC

## 2017-07-04 NOTE — Telephone Encounter (Signed)
Refill request received via fax  Last Visit: 05/25/17 Next Visit: 10/07/17 Labs: 05/25/17 stable  Okay to refill per Dr. Corliss Skains

## 2017-08-24 ENCOUNTER — Other Ambulatory Visit: Payer: Self-pay | Admitting: Rheumatology

## 2017-08-24 DIAGNOSIS — Z79899 Other long term (current) drug therapy: Secondary | ICD-10-CM

## 2017-08-24 NOTE — Telephone Encounter (Signed)
Last visit: 05/25/2017 Next visit: 10/07/2017 Labs: 05/25/2017 stable  TB Gold: 08/13/2016 Negative   Advised patient she is due for labs, patient will come in to have labs drawn.   Okay to refill 30 day supply, per Dr. Corliss Skains.

## 2017-09-12 ENCOUNTER — Telehealth: Payer: Self-pay | Admitting: Rheumatology

## 2017-09-12 DIAGNOSIS — Z79899 Other long term (current) drug therapy: Secondary | ICD-10-CM

## 2017-09-12 NOTE — Telephone Encounter (Signed)
Patient going to [facility] in [place]. Request orders to sent. Patient going in next two days.

## 2017-09-12 NOTE — Addendum Note (Signed)
Addended by: Henriette Combs on: 09/12/2017 11:08 AM   Modules accepted: Orders

## 2017-09-12 NOTE — Telephone Encounter (Signed)
Lab Orders released.  

## 2017-09-19 ENCOUNTER — Other Ambulatory Visit: Payer: Self-pay | Admitting: Rheumatology

## 2017-09-19 NOTE — Telephone Encounter (Signed)
Last Visit: 05/25/17 Next Visit: 10/07/17 Labs: 05/25/17 stable TB Gold: 08/13/16 Neg  Left message to advise patient she is due for labs.   Okay to refill 30 day supply per Dr. Corliss Skains

## 2017-09-20 ENCOUNTER — Other Ambulatory Visit: Payer: Self-pay | Admitting: *Deleted

## 2017-09-20 MED ORDER — ONDANSETRON HCL 4 MG PO TABS: 4.0000 mg | ORAL_TABLET | ORAL | 2 refills | Status: DC | PRN

## 2017-09-20 NOTE — Telephone Encounter (Signed)
Refill request received via fax  Last Visit: 05/25/17 Next Visit: 10/07/17  Okay to refill per Dr. Corliss Skains

## 2017-09-23 NOTE — Telephone Encounter (Signed)
LFTs are elevated.  She was doing clinically quite well in November 2018.  She may reduce methotrexate to 0.7 mL subcu weekly.

## 2017-09-26 ENCOUNTER — Telehealth: Payer: Self-pay | Admitting: Rheumatology

## 2017-09-26 ENCOUNTER — Other Ambulatory Visit: Payer: Self-pay | Admitting: Rheumatology

## 2017-09-26 NOTE — Telephone Encounter (Signed)
Patient called stating that she went to the pharmacy to pick up the Methotrexate and the prescription changed from 8 pills per week to 7 pills per week.  Patient wants to make sure that its correct.  Patient also requested a return call regarding her lab results.

## 2017-09-26 NOTE — Progress Notes (Deleted)
Office Visit Note  Patient: Shannon David             Date of Birth: 08-20-1990           MRN: [id number]             PCP: Eunice Blase, PA-C Referring: Eunice Blase, PA-C Visit Date: 10/07/2017 Occupation: @GUAROCC @    Subjective:  No chief complaint on file.   History of Present Illness: Shannon David is a 27 y.o. female ***   Activities of Daily Living:  Patient reports morning stiffness for *** {minute/hour:19697}.   Patient {ACTIONS;DENIES/REPORTS:21021675::"Denies"} nocturnal pain.  Difficulty dressing/grooming: {ACTIONS;DENIES/REPORTS:21021675::"Denies"} Difficulty climbing stairs: {ACTIONS;DENIES/REPORTS:21021675::"Denies"} Difficulty getting out of chair: {ACTIONS;DENIES/REPORTS:21021675::"Denies"} Difficulty using hands for taps, buttons, cutlery, and/or writing: {ACTIONS;DENIES/REPORTS:21021675::"Denies"}   No Rheumatology ROS completed.   PMFS History:  Patient Active Problem List   Diagnosis Date Noted  . History of PCOS 02/21/2017  . High risk medication use 09/21/2016  . Rheumatoid arthritis involving multiple sites with positive rheumatoid factor (HCC) 09/21/2016  . History of hypothyroidism 09/21/2016  . History of depression 09/21/2016    Past Medical History:  Diagnosis Date  . Arthritis     Family History  Problem Relation Age of Onset  . Hypertension Mother   . Clotting disorder Mother    Past Surgical History:  Procedure Laterality Date  . BUNIONECTOMY Right    Social History   Social History Narrative  . Not on file     Objective: Vital Signs: There were no vitals taken for this visit.   Physical Exam   Musculoskeletal Exam: ***  CDAI Exam: No CDAI exam completed.    Investigation: No additional findings.TB Gold: 09/22/2017 CBC Latest Ref Rng & Units 09/22/2017 05/25/2017 03/23/2017  WBC 3.4 - 10.8 x10E3/uL 5.8 5.3 5.1  Hemoglobin 11.1 - 15.9 g/dL 03/25/2017 40.9 81.1  Hematocrit 34.0 - 46.6 % 36.3 35.8 35.0  Platelets 150  - 379 x10E3/uL 246 259 254   CMP Latest Ref Rng & Units 09/22/2017 05/25/2017 03/23/2017  Glucose 65 - 99 mg/dL 84 78 87  BUN 6 - 20 mg/dL 12 14 15   Creatinine 0.57 - 1.00 mg/dL 03/25/2017 7.82  Sodium 134 - 144 mmol/L 140 136 136  Potassium 3.5 - 5.2 mmol/L 3.7 4.5 4.2  Chloride 96 - 106 mmol/L 101 102 101  CO2 20 - 29 mmol/L 22 26 26   Calcium 8.7 - 10.2 mg/dL 9.6 9.6 9.0  Total Protein 6.0 - 8.5 g/dL 7.5 7.5 7.3  Total Bilirubin 0.0 - 1.2 mg/dL 0.4 0.3 0.4  Alkaline Phos 39 - 117 IU/L 67 - -  AST 0 - 40 IU/L 28 13 15   ALT 0 - 32 IU/L 45(H) 11 16    Imaging: No results found.  Speciality Comments: No specialty comments available.    Procedures:  No procedures performed Allergies: Keflex [cephalexin]   Assessment / Plan:     Visit Diagnoses: No diagnosis found.    Orders: No orders of the defined types were placed in this encounter.  No orders of the defined types were placed in this encounter.   Face-to-face time spent with patient was *** minutes. 50% of time was spent in counseling and coordination of care.  Follow-Up Instructions: No follow-ups on file.   9.56, CMA  Note - This record has been created using 2.13.  Chart creation errors have been sought, but may not always  have been located. Such creation errors do not reflect on  the standard of medical care. 

## 2017-09-26 NOTE — Telephone Encounter (Addendum)
Last Visit: 05/25/17 Next Visit: 10/07/17 Labs: 09/22/17 LFTs are elevated. She was doing clinically quite well in November 2018. She may reduce methotrexate to 7 tabs weekly  Okay to refill per Dr. Corliss Skains

## 2017-09-26 NOTE — Telephone Encounter (Signed)
Patient advised of lab results and recommendations. Patient verbalized understanding. Patient advised she is to reduce MTX to 7 tabs per week.

## 2017-09-27 LAB — CBC WITH DIFFERENTIAL/PLATELET
Basophils Absolute: 0 10*3/uL (ref 0.0–0.2)
Basos: 0 %
EOS (ABSOLUTE): 0 10*3/uL (ref 0.0–0.4)
EOS: 0 %
HEMATOCRIT: 36.3 % (ref 34.0–46.6)
HEMOGLOBIN: 12.9 g/dL (ref 11.1–15.9)
IMMATURE GRANULOCYTES: 0 %
Immature Grans (Abs): 0 10*3/uL (ref 0.0–0.1)
LYMPHS ABS: 1.1 10*3/uL (ref 0.7–3.1)
LYMPHS: 18 %
MCH: 31.9 pg (ref 26.6–33.0)
MCHC: 35.5 g/dL (ref 31.5–35.7)
MCV: 90 fL (ref 79–97)
MONOCYTES: 11 %
Monocytes Absolute: 0.6 10*3/uL (ref 0.1–0.9)
Neutrophils Absolute: 4.1 10*3/uL (ref 1.4–7.0)
Neutrophils: 71 %
Platelets: 246 10*3/uL (ref 150–379)
RBC: 4.04 x10E6/uL (ref 3.77–5.28)
RDW: 13.6 % (ref 12.3–15.4)
WBC: 5.8 10*3/uL (ref 3.4–10.8)

## 2017-09-27 LAB — CMP14+EGFR
ALBUMIN: 4.5 g/dL (ref 3.5–5.5)
ALK PHOS: 67 IU/L (ref 39–117)
ALT: 45 IU/L — AB (ref 0–32)
AST: 28 IU/L (ref 0–40)
Albumin/Globulin Ratio: 1.5 (ref 1.2–2.2)
BUN / CREAT RATIO: 21 (ref 9–23)
BUN: 12 mg/dL (ref 6–20)
Bilirubin Total: 0.4 mg/dL (ref 0.0–1.2)
CO2: 22 mmol/L (ref 20–29)
CREATININE: 0.58 mg/dL (ref 0.57–1.00)
Calcium: 9.6 mg/dL (ref 8.7–10.2)
Chloride: 101 mmol/L (ref 96–106)
GFR calc Af Amer: 145 mL/min/{1.73_m2} (ref >59)
GFR calc non Af Amer: 126 mL/min/{1.73_m2} (ref >59)
GLUCOSE: 84 mg/dL (ref 65–99)
Globulin, Total: 3 g/dL (ref 1.5–4.5)
Potassium: 3.7 mmol/L (ref 3.5–5.2)
Sodium: 140 mmol/L (ref 134–144)
TOTAL PROTEIN: 7.5 g/dL (ref 6.0–8.5)

## 2017-09-27 LAB — QUANTIFERON-TB GOLD PLUS
QuantiFERON Mitogen Value: 7.82 IU/mL
QuantiFERON Nil Value: 0.04 IU/mL
QuantiFERON TB1 Ag Value: 0.03 IU/mL
QuantiFERON TB2 Ag Value: 0.03 IU/mL
QuantiFERON-TB Gold Plus: NEGATIVE

## 2017-10-04 NOTE — Progress Notes (Deleted)
Office Visit Note  Patient: Shannon David             Date of Birth: 1990/07/16           MRN: [id number]             PCP: Eunice Blase, PA-C Referring: Eunice Blase, PA-C Visit Date: 10/17/2017 Occupation: @GUAROCC @    Subjective:  No chief complaint on file.   History of Present Illness: Shannon David is a 27 y.o. female ***   Activities of Daily Living:  Patient reports morning stiffness for *** {minute/hour:19697}.   Patient {ACTIONS;DENIES/REPORTS:21021675::"Denies"} nocturnal pain.  Difficulty dressing/grooming: {ACTIONS;DENIES/REPORTS:21021675::"Denies"} Difficulty climbing stairs: {ACTIONS;DENIES/REPORTS:21021675::"Denies"} Difficulty getting out of chair: {ACTIONS;DENIES/REPORTS:21021675::"Denies"} Difficulty using hands for taps, buttons, cutlery, and/or writing: {ACTIONS;DENIES/REPORTS:21021675::"Denies"}   No Rheumatology ROS completed.   PMFS History:  Patient Active Problem List   Diagnosis Date Noted  . History of PCOS 02/21/2017  . High risk medication use 09/21/2016  . Rheumatoid arthritis involving multiple sites with positive rheumatoid factor (HCC) 09/21/2016  . History of hypothyroidism 09/21/2016  . History of depression 09/21/2016    Past Medical History:  Diagnosis Date  . Arthritis     Family History  Problem Relation Age of Onset  . Hypertension Mother   . Clotting disorder Mother    Past Surgical History:  Procedure Laterality Date  . BUNIONECTOMY Right    Social History   Social History Narrative  . Not on file     Objective: Vital Signs: There were no vitals taken for this visit.   Physical Exam   Musculoskeletal Exam: ***  CDAI Exam: No CDAI exam completed.    Investigation: No additional findings.TB Gold: 09/22/2017 Negative  CBC Latest Ref Rng & Units 09/22/2017 05/25/2017 03/23/2017  WBC 3.4 - 10.8 x10E3/uL 5.8 5.3 5.1  Hemoglobin 11.1 - 15.9 g/dL 03/25/2017 25.0 53.9  Hematocrit 34.0 - 46.6 % 36.3 35.8 35.0    Platelets 150 - 379 x10E3/uL 246 259 254   CMP Latest Ref Rng & Units 09/22/2017 05/25/2017 03/23/2017  Glucose 65 - 99 mg/dL 84 78 87  BUN 6 - 20 mg/dL 12 14 15   Creatinine 0.57 - 1.00 mg/dL 03/25/2017 3.41  Sodium 134 - 144 mmol/L 140 136 136  Potassium 3.5 - 5.2 mmol/L 3.7 4.5 4.2  Chloride 96 - 106 mmol/L 101 102 101  CO2 20 - 29 mmol/L 22 26 26   Calcium 8.7 - 10.2 mg/dL 9.6 9.6 9.0  Total Protein 6.0 - 8.5 g/dL 7.5 7.5 7.3  Total Bilirubin 0.0 - 1.2 mg/dL 0.4 0.3 0.4  Alkaline Phos 39 - 117 IU/L 67 - -  AST 0 - 40 IU/L 28 13 15   ALT 0 - 32 IU/L 45(H) 11 16    Imaging: No results found.  Speciality Comments: No specialty comments available.    Procedures:  No procedures performed Allergies: Keflex [cephalexin]   Assessment / Plan:     Visit Diagnoses: No diagnosis found.    Orders: No orders of the defined types were placed in this encounter.  No orders of the defined types were placed in this encounter.   Face-to-face time spent with patient was *** minutes. 50% of time was spent in counseling and coordination of care.  Follow-Up Instructions: No follow-ups on file.   9.37, CMA  Note - This record has been created using 9.02.  Chart creation errors have been sought, but may not always  have been located. Such creation errors do  not reflect on  the standard of medical care.

## 2017-10-07 ENCOUNTER — Ambulatory Visit: Payer: BLUE CROSS/BLUE SHIELD | Admitting: Rheumatology

## 2017-10-12 NOTE — Progress Notes (Signed)
Office Visit Note  Patient: Shannon David             Date of Birth: [birth year]           MRN: [id number]             PCP: Eunice Blase, PA-C Referring: Eunice Blase, PA-C Visit Date: 10/20/2017 Occupation: @GUAROCC @    Subjective:  Hand pain   History of Present Illness: Shannon David is a 27 y.o. female with history of seropositive rheumatoid arthritis and osteoarthritis.  She injects Orencia subcutaneously once a week, takes MTX 7 tablets weekly, and takes folic acid 2 mg daily.  She was started on Orencia at the end of September 2019.  She felt that October 2019 was improving her symptoms until she had to reduce her methotrexate dose from 8 tablets to 7 tablets weekly due to elevated ALT.  She states that she has been having more frequent flares over the past 2 months.  She states that she had a flare in her right knee 1 month ago.  She states that last week she developed a flare in her right hand that lasted about 2 days.  She says she is currently having pain in her right hand and left hand.  She states that she is having swelling in her bilateral hands intermittently.  She denies any swelling in any other joints.  She states that her bilateral knees have been feeling better.  She states that her last cortisone injection in her knee is also improved.  She says she is also been doing the exercises on a regular basis.  She states that at work she did a weight loss challenge and has lost 36 pounds since the end of January.    Activities of Daily Living:  Patient reports morning stiffness for  2 hours.   Patient Denies nocturnal pain.  Difficulty dressing/grooming: Denies Difficulty climbing stairs: Reports Difficulty getting out of chair: Denies Difficulty using hands for taps, buttons, cutlery, and/or writing: Denies   Review of Systems  Constitutional: Negative for fatigue.  HENT: Negative for mouth sores, mouth dryness and nose dryness.   Eyes: Negative for pain, visual  disturbance and dryness.  Respiratory: Negative for cough, hemoptysis, shortness of breath and difficulty breathing.   Cardiovascular: Negative for chest pain, palpitations, hypertension and swelling in legs/feet.  Gastrointestinal: Negative for blood in stool, constipation and diarrhea.  Endocrine: Negative for increased urination.  Genitourinary: Negative for painful urination.  Musculoskeletal: Positive for arthralgias, joint pain, joint swelling and morning stiffness. Negative for myalgias, muscle weakness, muscle tenderness and myalgias.  Skin: Negative for color change, pallor, rash, hair loss, nodules/bumps, skin tightness, ulcers and sensitivity to sunlight.  Allergic/Immunologic: Negative for susceptible to infections.  Neurological: Negative for dizziness, numbness, headaches and weakness.  Hematological: Negative for swollen glands.  Psychiatric/Behavioral: Negative for depressed mood and sleep disturbance. The patient is not nervous/anxious.     PMFS History:  Patient Active Problem List   Diagnosis Date Noted  . History of PCOS 02/21/2017  . High risk medication use 09/21/2016  . Rheumatoid arthritis involving multiple sites with positive rheumatoid factor (HCC) 09/21/2016  . History of hypothyroidism 09/21/2016  . History of depression 09/21/2016    Past Medical History:  Diagnosis Date  . Arthritis     Family History  Problem Relation Age of Onset  . Hypertension Mother   . Clotting disorder Mother   . Cancer Maternal Grandmother        lung  Past Surgical History:  Procedure Laterality Date  . BUNIONECTOMY Right    Social History   Social History Narrative  . Not on file     Objective: Vital Signs: BP 118/78 (BP Location: Left Arm, Patient Position: Sitting, Cuff Size: Normal)   Pulse 75   Resp 15   Ht 5\' 2"  (1.575 m)   Wt 198 lb (89.8 kg)   BMI 36.21 kg/m    Physical Exam  Constitutional: She is oriented to person, place, and time. She appears  well-developed and well-nourished.  HENT:  Head: Normocephalic and atraumatic.  Eyes: Conjunctivae and EOM are normal.  Neck: Normal range of motion.  Cardiovascular: Normal rate, regular rhythm, normal heart sounds and intact distal pulses.  Pulmonary/Chest: Effort normal and breath sounds normal.  Abdominal: Soft. Bowel sounds are normal.  Lymphadenopathy:    She has no cervical adenopathy.  Neurological: She is oriented to person, place, and time.  Skin: Skin is warm and dry. Capillary refill takes less than 2 seconds.  Psychiatric: She has a normal mood and affect. Her behavior is normal.  Nursing note and vitals reviewed.    Musculoskeletal Exam: C-spine, thoracic spine, lumbar spine good range of motion.  No midline central tenderness.  No SI joint tenderness.  Shoulder joints, elbow joints, wrist joints, MCPs, PIPs, DIPs good range of motion with no synovitis.  She has ulnar deviation bilaterally.  Hip joints, knee joints, ankle joints, MTPs, PIPs, DIPs good range of motion in the 70s.  No warmth or effusion of bilateral knees.  No tenderness of trochanteric bursa.  CDAI Exam: CDAI Homunculus Exam:   Joint Counts:  CDAI Tender Joint count: 0 CDAI Swollen Joint count: 0  Global Assessments:  Patient Global Assessment: 6 Provider Global Assessment: 6  CDAI Calculated Score: 12    Investigation: No additional findings. CBC Latest Ref Rng & Units 09/22/2017 05/25/2017 03/23/2017  WBC 3.4 - 10.8 x10E3/uL 5.8 5.3 5.1  Hemoglobin 11.1 - 15.9 g/dL 03/25/2017 78.9 38.1  Hematocrit 34.0 - 46.6 % 36.3 35.8 35.0  Platelets 150 - 379 x10E3/uL 246 259 254   CMP Latest Ref Rng & Units 09/22/2017 05/25/2017 03/23/2017  Glucose 65 - 99 mg/dL 84 78 87  BUN 6 - 20 mg/dL 12 14 15   Creatinine 0.57 - 1.00 mg/dL 03/25/2017 5.10  Sodium 134 - 144 mmol/L 140 136 136  Potassium 3.5 - 5.2 mmol/L 3.7 4.5 4.2  Chloride 96 - 106 mmol/L 101 102 101  CO2 20 - 29 mmol/L 22 26 26   Calcium 8.7 - 10.2 mg/dL  9.6 9.6 9.0  Total Protein 6.0 - 8.5 g/dL 7.5 7.5 7.3  Total Bilirubin 0.0 - 1.2 mg/dL 0.4 0.3 0.4  Alkaline Phos 39 - 117 IU/L 67 - -  AST 0 - 40 IU/L 28 13 15   ALT 0 - 32 IU/L 45(H) 11 16    Imaging: No results found.  Speciality Comments: No specialty comments available.    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  She has been having increased discomfort and swelling in her bilateral hands since decreasing her dose of methotrexate from 8 tablets a week to 7 tablets a week due to elevated ALT at 45 and 09/22/2017.  She was advised to  continue to avoid Tylenol, NSAIDs, and alcohol.  We are going to increase her dose back to methotrexate tablets weekly and continue to monitor her LFTs.  She will continue injecting Orencia subcutaneously once a week.  She will follow-up in 5 months.  She was advised to let us know if she has increased joint pain and joint swelling.  If she has an inadequate response once increasing her methotrexate dose we will consider switching her from [place] to [place].  High risk medication use - Orencia sub q weekly (MTX 8 tablets po every week, folic acid 2 tablets. (Enbrel, Humira inadequate response)TB gold: 09/22/17. CBC and CMP: 09/22/17.  CBC and CMP will be drawn in June and every 3 months to monitor for drug toxicity.  Primary osteoarthritis of both knees: No warmth or effusion was noted on exam.  She is been performing the exercises on a daily basis.  Left cortisone knee injection helped considerably in November.  She is also lost 36 pounds since the end of January while doing a weight loss challenge at work.  She is going to continue to exercise and try to lose weight.  Other medical  conditions are listed as follows:  History of depression  History of hypothyroidism  History of PCOS    Orders: No orders of the defined types were placed in this encounter.  No orders of the defined types were placed in this encounter.  Follow-Up Instructions: Return for Rheumatoid arthritis, Osteoarthritis.   Gearldine Bienenstock, PA-C I examined and evaluated the patient with Sherron Ales PA.  Patient had no synovitis on examination today.  Although she did have some tenderness over PIPs and MCPs.  She states she was doing better when she was on increased dose of methotrexate at 8 tablets p.o. weekly.  We decided to increase methotrexate to 8 tablets p.o. weekly.  We will monitor her LFTs closely.  Her LFTs were only mildly elevated I will increase methotrexate dose.  If she has an adequate response to the combination therapy we may consider other treatment options.  The plan of care was discussed as noted above.  Pollyann Savoy, MD Note - This record has been created using Animal nutritionist.  Chart creation errors have been sought, but may not always  have been located. Such creation errors do not reflect on  the standard of medical care.

## 2017-10-17 ENCOUNTER — Ambulatory Visit: Payer: BLUE CROSS/BLUE SHIELD | Admitting: Rheumatology

## 2017-10-20 ENCOUNTER — Ambulatory Visit (INDEPENDENT_AMBULATORY_CARE_PROVIDER_SITE_OTHER): Payer: 59 | Admitting: Rheumatology

## 2017-10-20 ENCOUNTER — Encounter: Payer: Self-pay | Admitting: Physician Assistant

## 2017-10-20 VITALS — BP 118/78 | HR 75 | Resp 15 | Ht 62.0 in | Wt 198.0 lb

## 2017-10-20 DIAGNOSIS — Z8742 Personal history of other diseases of the female genital tract: Secondary | ICD-10-CM | POA: Diagnosis not present

## 2017-10-20 DIAGNOSIS — M170 Bilateral primary osteoarthritis of knee: Secondary | ICD-10-CM | POA: Diagnosis not present

## 2017-10-20 DIAGNOSIS — Z8659 Personal history of other mental and behavioral disorders: Secondary | ICD-10-CM | POA: Diagnosis not present

## 2017-10-20 DIAGNOSIS — M0579 Rheumatoid arthritis with rheumatoid factor of multiple sites without organ or systems involvement: Secondary | ICD-10-CM

## 2017-10-20 DIAGNOSIS — Z79899 Other long term (current) drug therapy: Secondary | ICD-10-CM

## 2017-10-20 DIAGNOSIS — Z8639 Personal history of other endocrine, nutritional and metabolic disease: Secondary | ICD-10-CM | POA: Diagnosis not present

## 2017-10-20 NOTE — Patient Instructions (Signed)
Standing Labs We placed an order today for your standing lab work.    Please come back and get your standing labs in June and every 3 months  We have open lab Monday through Friday from 8:30-11:30 AM and 1:30-4:00 PM  at the office of Dr. Shaili Deveshwar.   You may experience shorter wait times on Monday and Friday afternoons. The office is located at [address], [address], [place], [place] [address] No appointment is necessary.   Labs are drawn by [facility].  You may receive a bill from [facility] for your lab work. If you have any questions regarding directions or hours of operation,  please call [phone].    

## 2017-10-27 ENCOUNTER — Other Ambulatory Visit: Payer: Self-pay | Admitting: Rheumatology

## 2017-10-27 NOTE — Telephone Encounter (Signed)
Last Visit: 10/20/17 Next Visit: 03/22/18 Labs:09/22/17 LFTs are elevated. She was doing clinically quite well in November 2018 TB Gold: 09/22/17 Neg   Okay to refill per Dr. Corliss Skains

## 2017-10-31 ENCOUNTER — Telehealth: Payer: Self-pay | Admitting: Rheumatology

## 2017-10-31 NOTE — Telephone Encounter (Signed)
Patient called stating she was just given a new birth control called Seasonique by her OB/GYN and wants to make sure it will not interfere with her other arthritis medications.

## 2017-10-31 NOTE — Telephone Encounter (Signed)
Attempted to contact the patient and left message for patient to call the office.  

## 2017-10-31 NOTE — Telephone Encounter (Signed)
Please notify patient that there are no known interactions between Orencia and MTX with her starting on [place].

## 2017-11-02 NOTE — Telephone Encounter (Signed)
Patient advised and verbalized understanding 

## 2017-11-17 ENCOUNTER — Encounter: Payer: Self-pay | Admitting: Rheumatology

## 2017-11-18 NOTE — Telephone Encounter (Signed)
Patient should see her PCP and a neurologist for evaluation. She may discontinue Orencia in the mean time although I do not think that her symptoms are related to Orencia.

## 2017-12-09 ENCOUNTER — Other Ambulatory Visit: Payer: Self-pay | Admitting: Rheumatology

## 2017-12-12 NOTE — Telephone Encounter (Signed)
Last Visit: 10/20/17 Next Visit: 03/22/18 Labs: 3/28/19LFTs are elevated. She was doing clinically quite well in November 2018  Okay to refill per Dr.Deveshwar  

## 2017-12-17 ENCOUNTER — Other Ambulatory Visit: Payer: Self-pay | Admitting: Rheumatology

## 2017-12-19 NOTE — Telephone Encounter (Signed)
Last Visit: 10/20/17 Next Visit: 03/22/18 Labs: 3/28/19LFTs are elevated. She was doing clinically quite well in November 2018  Okay to refill per Dr.Deveshwar

## 2018-01-16 ENCOUNTER — Telehealth: Payer: Self-pay | Admitting: Rheumatology

## 2018-01-16 DIAGNOSIS — Z79899 Other long term (current) drug therapy: Secondary | ICD-10-CM

## 2018-01-16 NOTE — Telephone Encounter (Signed)
Patient called requesting labwork orders be sent to [facility] in [place].  Patient states she would like to go this morning at 9:00 am.   Please call back if not able to release orders in time.

## 2018-01-16 NOTE — Telephone Encounter (Signed)
[facility] computers are down at this time.  Please fax orders to [phone]

## 2018-01-16 NOTE — Telephone Encounter (Signed)
Orders faxed

## 2018-01-16 NOTE — Telephone Encounter (Signed)
Lab Orders released.  

## 2018-01-17 LAB — CMP14+EGFR
ALBUMIN: 4.6 g/dL (ref 3.5–5.5)
ALK PHOS: 87 IU/L (ref 39–117)
ALT: 23 IU/L (ref 0–32)
AST: 15 IU/L (ref 0–40)
Albumin/Globulin Ratio: 1.8 (ref 1.2–2.2)
BUN / CREAT RATIO: 33 — AB (ref 9–23)
BUN: 20 mg/dL (ref 6–20)
Bilirubin Total: 0.4 mg/dL (ref 0.0–1.2)
CO2: 20 mmol/L (ref 20–29)
CREATININE: 0.6 mg/dL (ref 0.57–1.00)
Calcium: 9.8 mg/dL (ref 8.7–10.2)
Chloride: 102 mmol/L (ref 96–106)
GFR calc non Af Amer: 124 mL/min/{1.73_m2} (ref >59)
GFR, EST AFRICAN AMERICAN: 142 mL/min/{1.73_m2} (ref >59)
GLOBULIN, TOTAL: 2.6 g/dL (ref 1.5–4.5)
GLUCOSE: 85 mg/dL (ref 65–99)
Potassium: 4.3 mmol/L (ref 3.5–5.2)
SODIUM: 138 mmol/L (ref 134–144)
TOTAL PROTEIN: 7.2 g/dL (ref 6.0–8.5)

## 2018-01-17 LAB — CBC WITH DIFFERENTIAL/PLATELET
BASOS: 0 %
Basophils Absolute: 0 10*3/uL (ref 0.0–0.2)
EOS (ABSOLUTE): 0 10*3/uL (ref 0.0–0.4)
EOS: 1 %
HEMATOCRIT: 40.2 % (ref 34.0–46.6)
HEMOGLOBIN: 13.9 g/dL (ref 11.1–15.9)
LYMPHS: 27 %
Lymphocytes Absolute: 1.1 10*3/uL (ref 0.7–3.1)
MCH: 31.4 pg (ref 26.6–33.0)
MCHC: 34.6 g/dL (ref 31.5–35.7)
MCV: 91 fL (ref 79–97)
MONOS ABS: 0.5 10*3/uL (ref 0.1–0.9)
Monocytes: 12 %
NEUTROS PCT: 60 %
Neutrophils Absolute: 2.5 10*3/uL (ref 1.4–7.0)
Platelets: 271 10*3/uL (ref 150–450)
RBC: 4.43 x10E6/uL (ref 3.77–5.28)
RDW: 13.4 % (ref 12.3–15.4)
WBC: 4.1 10*3/uL (ref 3.4–10.8)

## 2018-01-26 ENCOUNTER — Other Ambulatory Visit: Payer: Self-pay | Admitting: Rheumatology

## 2018-01-26 NOTE — Telephone Encounter (Signed)
Last visit: 10/20/2017 Next visit: 03/22/2018 Labs: 01/16/2018 BUN/creatinine ratio is 33. We will continue to monitor. TB Gold: 09/22/2017 Negative   Okay to refill per Dr. Corliss Skains.

## 2018-03-08 NOTE — Progress Notes (Signed)
Office Visit Note  Patient: Shannon David             Date of Birth: 06-30-1990           MRN: [id number]             PCP: Eunice Blase, PA-C Referring: Eunice Blase, PA-C Visit Date: 03/22/2018 Occupation: @GUAROCC @  Subjective:  Pain in multiple joints   History of Present Illness: Shannon David is a 27 y.o. female with history of seropositive rheumatoid arthritis and osteoarthritis.  She is on Orencia sq once weekly, MTX 8 tablets by mouth once a week, and folic acid 2 mg daily.  She has been flaring more frequently.  She has not missed any doses recently.  She has severe pain in the left shoulder joint that has been keeping her up at night.  She states her morning stiffness has been lasting longer 2-4 hours every morning.  She states she has been having increased pain in both hands.  She has chronic feet pain.  She denies any ankle joint pain.  She reports intermittent bilateral knee pain.  She denies any knee joint swelling.  She has been taking tylenol PRN for pain relief.  She also has a right middle trigger finger.  She has noticed nodules appearing on multiple joints on her hands.      Activities of Daily Living:  Patient reports morning stiffness for 2-3 hours.   Patient Reports nocturnal pain.  Difficulty dressing/grooming: Denies Difficulty climbing stairs: Denies Difficulty getting out of chair: Denies Difficulty using hands for taps, buttons, cutlery, and/or writing: Reports  Review of Systems  Constitutional: Negative for fatigue.  HENT: Negative for mouth sores, mouth dryness and nose dryness.   Eyes: Negative for pain, visual disturbance and dryness.  Respiratory: Negative for cough, hemoptysis, shortness of breath and difficulty breathing.   Cardiovascular: Negative for chest pain, palpitations, hypertension and swelling in legs/feet.  Gastrointestinal: Negative for blood in stool, constipation and diarrhea.  Endocrine: Negative for increased urination.    Genitourinary: Negative for painful urination.  Musculoskeletal: Positive for arthralgias, joint pain, joint swelling and morning stiffness. Negative for myalgias, muscle weakness, muscle tenderness and myalgias.  Skin: Positive for nodules/bumps. Negative for color change, pallor, rash, hair loss, skin tightness, ulcers and sensitivity to sunlight.  Allergic/Immunologic: Negative for susceptible to infections.  Neurological: Negative for dizziness, numbness, headaches and weakness.  Hematological: Negative for swollen glands.  Psychiatric/Behavioral: Negative for depressed mood and sleep disturbance. The patient is not nervous/anxious.     PMFS History:  Patient Active Problem List   Diagnosis Date Noted  . History of PCOS 02/21/2017  . High risk medication use 09/21/2016  . Rheumatoid arthritis involving multiple sites with positive rheumatoid factor (HCC) 09/21/2016  . History of hypothyroidism 09/21/2016  . History of depression 09/21/2016    Past Medical History:  Diagnosis Date  . Arthritis     Family History  Problem Relation Age of Onset  . Hypertension Mother   . Clotting disorder Mother   . Cancer Maternal Grandmother        lung   Past Surgical History:  Procedure Laterality Date  . BUNIONECTOMY Right    Social History   Social History Narrative  . Not on file    Objective: Vital Signs: BP 114/72 (BP Location: Left Arm, Patient Position: Sitting, Cuff Size: Normal)   Pulse 84   Resp 14   Ht 5\' 2"  (1.575 m)   Wt 161 lb 12.8  oz (73.4 kg)   BMI 29.59 kg/m    Physical Exam  Constitutional: She is oriented to person, place, and time. She appears well-developed and well-nourished.  HENT:  Head: Normocephalic and atraumatic.  Eyes: Conjunctivae and EOM are normal.  Neck: Normal range of motion.  Cardiovascular: Normal rate, regular rhythm, normal heart sounds and intact distal pulses.  Pulmonary/Chest: Effort normal and breath sounds normal.  Abdominal:  Soft. Bowel sounds are normal.  Lymphadenopathy:    She has no cervical adenopathy.  Neurological: She is alert and oriented to person, place, and time.  Skin: Skin is warm and dry. Capillary refill takes less than 2 seconds.  Psychiatric: She has a normal mood and affect. Her behavior is normal.  Nursing note and vitals reviewed.    Musculoskeletal Exam:  C-spine, thoracic spine, and lumbar spine good ROM.  No midline spinal tenderness.  No SI joint tenderness. Left shoulder full ROM with discomfort.  Right shoulder full ROM with no discomfort.  Elbow joints good ROM with no synovitis or tenderness.  Wrist joint good ROM with no synovitis or tenderness.  Incomplete fist formation bilaterally.  She has tenderness and synovitis of right 2nd, 3rd, and 4th MCPs and right 5th PIP joints.  She has synovitis of left 1st and 2nd MCP joints.  Hip joints, knee joints, ankle joints good ROM with no synovitis.  No warmth or effusion of knee joints.  Left knee tenderness on exam.  No ankle joint swelling or tenderness.  No achilles tendonitis or plantar fasciitis.  No tenderness of trochanteric bursa bilaterally.   CDAI Exam: CDAI Score: 15.2  Patient Global Assessment: 6 (mm); Provider Global Assessment: 6 (mm) Swollen: 6 ; Tender: 18  Joint Exam      Right  Left  Glenohumeral      Tender  MCP 1     Swollen Tender  MCP 2  Swollen Tender  Swollen Tender  MCP 3  Swollen Tender     MCP 4  Swollen Tender     PIP 5  Swollen Tender     Knee      Tender  MTP 1   Tender   Tender  MTP 2   Tender   Tender  MTP 3   Tender   Tender  MTP 4   Tender   Tender  MTP 5   Tender   Tender     Investigation: No additional findings.  Imaging: Xr Shoulder Left  Result Date: 03/22/2018 No glenohumeral joint space narrowing was noted.  No chondrocalcinosis was noted.  No acromioclavicular joint space narrowing was noted. Impression: Unremarkable x-ray of the shoulder joint.   Recent Labs: Lab Results    Component Value Date   WBC 4.1 01/16/2018   HGB 13.9 01/16/2018   PLT 271 01/16/2018   NA 138 01/16/2018   K 4.3 01/16/2018   CL 102 01/16/2018   CO2 20 01/16/2018   GLUCOSE 85 01/16/2018   BUN 20 01/16/2018   CREATININE 0.60 01/16/2018   BILITOT 0.4 01/16/2018   ALKPHOS 87 01/16/2018   AST 15 01/16/2018   ALT 23 01/16/2018   PROT 7.2 01/16/2018   ALBUMIN 4.6 01/16/2018   CALCIUM 9.8 01/16/2018   GFRAA 142 01/16/2018   QFTBGOLD Negative 08/13/2016   QFTBGOLDPLUS Negative 09/22/2017    Speciality Comments: No specialty comments available.  Procedures:  Large Joint Inj: L glenohumeral on 03/22/2018 9:38 AM Indications: pain Details: 27 G 1.5 in needle, posterior approach  Arthrogram:  No  Medications: 1.5 mL lidocaine 1 %; 40 mg triamcinolone acetonide 40 MG/ML Aspirate: 0 mL Outcome: tolerated well, no immediate complications Procedure, treatment alternatives, risks and benefits explained, specific risks discussed. Consent was given by the patient. Immediately prior to procedure a time out was called to verify the correct patient, procedure, equipment, support staff and site/side marked as required. Patient was prepped and draped in the usual sterile fashion.     Allergies: Keflex [cephalexin]   Assessment / Plan:     Visit Diagnoses: Rheumatoid arthritis involving multiple sites with positive rheumatoid factor (HCC) - Positive RF, positive anti-CCP, erosive disease with contractures and ulnar deviation: She has synovitis and tenderness on exam today as noted above.  She has been having more frequent rheumatoid arthritis flares.  She has been having increased bilateral hand pain, bilateral feet pain, bilateral knee pain, and left shoulder pain. X-ray of the left shoulder was obtained today that was unremarkable.  A left shoulder cortisone injection was performed today.  She tolerated the procedure well.  She declined a prescription for prednisone at this time.  We discussed  switching her from Orencia to Rinvoq.  Indications, contraindications, and potential side effects were discussed.  All questions were addressed. Consent was obtained today.    Medication counseling: TB test: negative 09/22/17 Hepatitis: 02/18/2013 Lipid panel: 03/23/18 SPEP: 02/18/13 Immunoglobulins: 02/18/13  Does patient have history of diverticulitis?  No  Counseled patient that Rinvoq is a JAK inhibitor indicated for Rheumatoid Arthritis.  Counseled patient on purpose, proper use, and adverse effects of Rinvoq.  Reviewed the most common adverse effects including infection, diarrhea, headaches.  Also reviewed rare adverse effects such as bowel injury and the need to contact us if she develops stomach pain during treatment.  Reviewed with patient that there is the possibility of an increased risk of malignancy but it is not well understood if this increased risk is due to the medication or the disease state.  Counseled patient to avoid live vaccines while on Rinvoq.  Provided patient with medication education material and answered all questions.  Patient consented to Rinvoq.  Will upload Rinvoq into patient's chart.    High risk medication use -Switching from Orencia sub q weekly to Rinvoq (MTX 8 tablets po every week, folic acid 2 tablets. (Enbrel, Humira inadequate response).  CMP and CBC were drawn on 01/16/18.    Primary osteoarthritis of both knees: She has been having increased discomfort in both knee joints, worse in the left knee.  No warmth or effusion noted. She has discomfort with left knee ROM.  Trigger finger, right middle finger: She has tenderness on exam. It locks up first thing in the morning.   Chronic left shoulder pain - She has been having severe left shoulder pain.  She has good ROM on exam with discomfort. A x-ray of the left shoulder was obtained today that was unremarkable. A cortisone injection of the left shoulder was performed today.  She tolerated the procedure well. A  handout of shoulder exercises were provided. Plan: XR Shoulder Left  Other medical conditions are listed as follows:  History of hypothyroidism  History of depression  History of PCOS   Orders: Orders Placed This Encounter  Procedures  . Large Joint Inj  . XR Shoulder Left   Meds ordered this encounter  Medications  . diclofenac sodium (VOLTAREN) 1 % GEL    Sig: Apply 3 grams to 3 large joints up to 3 times daily    Dispense:  3 Tube    Refill:  3    Face-to-face time spent with patient was 30 minutes. Greater than 50% of time was spent in counseling and coordination of care.  Follow-Up Instructions: Return in about 3 months (around 06/21/2018) for Rheumatoid arthritis, Osteoarthritis.   Gearldine Bienenstock, PA-C I examined and evaluated the patient with Sherron Ales PA.  Patient has history of aggressive erosive arthritis with multiple contractures.  She had active synovitis on my examination today.  She is having an adequate response to Orencia and methotrexate combination.  We had detailed discussion regarding different treatment options.  After reviewing indications side effects contraindications we decided to proceed with Rinvoq.we will apply for the medication.  Once approved then Orencia will be discontinued and we can add Rinvoq to methotrexate.  The plan of care was discussed as noted above.  Pollyann Savoy, MD Note - This record has been created using Animal nutritionist.  Chart creation errors have been sought, but may not always  have been located. Such creation errors do not reflect on  the standard of medical care.

## 2018-03-14 ENCOUNTER — Other Ambulatory Visit: Payer: Self-pay | Admitting: Rheumatology

## 2018-03-14 NOTE — Telephone Encounter (Signed)
Last Visit: 10/20/17 Next Visit: 03/22/18 Labs: 01/16/18 BUN/creatinine ratio is 33. We will continue to monitor. All labs are WNL.  Okay to refill per Dr. Corliss Skains

## 2018-03-22 ENCOUNTER — Telehealth: Payer: Self-pay | Admitting: Pharmacy Technician

## 2018-03-22 ENCOUNTER — Telehealth: Payer: Self-pay | Admitting: *Deleted

## 2018-03-22 ENCOUNTER — Ambulatory Visit (INDEPENDENT_AMBULATORY_CARE_PROVIDER_SITE_OTHER): Payer: 59

## 2018-03-22 ENCOUNTER — Encounter: Payer: Self-pay | Admitting: Physician Assistant

## 2018-03-22 ENCOUNTER — Ambulatory Visit (INDEPENDENT_AMBULATORY_CARE_PROVIDER_SITE_OTHER): Payer: 59 | Admitting: Rheumatology

## 2018-03-22 VITALS — BP 114/72 | HR 84 | Resp 14 | Ht 62.0 in | Wt 161.8 lb

## 2018-03-22 DIAGNOSIS — M25512 Pain in left shoulder: Secondary | ICD-10-CM

## 2018-03-22 DIAGNOSIS — G8929 Other chronic pain: Secondary | ICD-10-CM

## 2018-03-22 DIAGNOSIS — M0579 Rheumatoid arthritis with rheumatoid factor of multiple sites without organ or systems involvement: Secondary | ICD-10-CM | POA: Diagnosis not present

## 2018-03-22 DIAGNOSIS — M65341 Trigger finger, right ring finger: Secondary | ICD-10-CM | POA: Diagnosis not present

## 2018-03-22 DIAGNOSIS — Z79899 Other long term (current) drug therapy: Secondary | ICD-10-CM

## 2018-03-22 DIAGNOSIS — Z8659 Personal history of other mental and behavioral disorders: Secondary | ICD-10-CM

## 2018-03-22 DIAGNOSIS — M170 Bilateral primary osteoarthritis of knee: Secondary | ICD-10-CM

## 2018-03-22 DIAGNOSIS — Z8639 Personal history of other endocrine, nutritional and metabolic disease: Secondary | ICD-10-CM

## 2018-03-22 DIAGNOSIS — Z8742 Personal history of other diseases of the female genital tract: Secondary | ICD-10-CM

## 2018-03-22 MED ORDER — LIDOCAINE HCL 1 % IJ SOLN
1.5000 mL | INTRAMUSCULAR | Status: AC | PRN
  Administered 2018-03-22: 1.5 mL

## 2018-03-22 MED ORDER — DICLOFENAC SODIUM 1 % TD GEL: TRANSDERMAL | 3 refills | Status: DC

## 2018-03-22 MED ORDER — TRIAMCINOLONE ACETONIDE 40 MG/ML IJ SUSP
40.0000 mg | INTRAMUSCULAR | Status: AC | PRN
  Administered 2018-03-22: 40 mg via INTRA_ARTICULAR

## 2018-03-22 NOTE — Patient Instructions (Addendum)
Standing Labs We placed an order today for your standing lab work.    Please come back and get your standing labs in October and every 3 months   We have open lab Monday through Friday from 8:30-11:30 AM and 1:30-4:00 PM  at the office of Dr. Pollyann Savoy.   You may experience shorter wait times on Monday and Friday afternoons. The office is located at [address], [address], [place], [place] [address] No appointment is necessary.   Labs are drawn by [facility].  You may receive a bill from [facility] for your lab work. If you have any questions regarding directions or hours of operation,  please call [phone].     Shoulder Exercises Ask your health care provider which exercises are safe for you. Do exercises exactly as told by your health care provider and adjust them as directed. It is normal to feel mild stretching, pulling, tightness, or discomfort as you do these exercises, but you should stop right away if you feel sudden pain or your pain gets worse.Do not begin these exercises until told by your health care provider. RANGE OF MOTION EXERCISES These exercises warm up your muscles and joints and improve the movement and flexibility of your shoulder. These exercises also help to relieve pain, numbness, and tingling. These exercises involve stretching your injured shoulder directly. Exercise A: Pendulum  1. Stand near a wall or a surface that you can hold onto for balance. 2. Bend at the waist and let your left / right arm hang straight down. Use your other arm to support you. Keep your back straight and do not lock your knees. 3. Relax your left / right arm and shoulder muscles, and move your hips and your trunk so your left / right arm swings freely. Your arm should swing because of the motion of your body, not because you are using your arm or shoulder muscles. 4. Keep moving your body so your arm swings in the following directions, as told by your health care provider: ? Side to  side. ? Forward and backward. ? In clockwise and counterclockwise circles. 5. Continue each motion for __________ seconds, or for as long as told by your health care provider. 6. Slowly return to the starting position. Repeat __________ times. Complete this exercise __________ times a day. Exercise B:Flexion, Standing  1. Stand and hold a broomstick, a cane, or a similar object. Place your hands a little more than shoulder-width apart on the object. Your left / right hand should be palm-up, and your other hand should be palm-down. 2. Keep your elbow straight and keep your shoulder muscles relaxed. Push the stick down with your healthy arm to raise your left / right arm in front of your body, and then over your head until you feel a stretch in your shoulder. ? Avoid shrugging your shoulder while you raise your arm. Keep your shoulder blade tucked down toward the middle of your back. 3. Hold for __________ seconds. 4. Slowly return to the starting position. Repeat __________ times. Complete this exercise __________ times a day. Exercise C: Abduction, Standing 1. Stand and hold a broomstick, a cane, or a similar object. Place your hands a little more than shoulder-width apart on the object. Your left / right hand should be palm-up, and your other hand should be palm-down. 2. While keeping your elbow straight and your shoulder muscles relaxed, push the stick across your body toward your left / right side. Raise your left / right arm  to the side of your body and then over your head until you feel a stretch in your shoulder. ? Do not raise your arm above shoulder height, unless your health care provider tells you to do that. ? Avoid shrugging your shoulder while you raise your arm. Keep your shoulder blade tucked down toward the middle of your back. 3. Hold for __________ seconds. 4. Slowly return to the starting position. Repeat __________ times. Complete this exercise __________ times a  day. Exercise D:Internal Rotation  1. Place your left / right hand behind your back, palm-up. 2. Use your other hand to dangle an exercise band, a towel, or a similar object over your shoulder. Grasp the band with your left / right hand so you are holding onto both ends. 3. Gently pull up on the band until you feel a stretch in the front of your left / right shoulder. ? Avoid shrugging your shoulder while you raise your arm. Keep your shoulder blade tucked down toward the middle of your back. 4. Hold for __________ seconds. 5. Release the stretch by letting go of the band and lowering your hands. Repeat __________ times. Complete this exercise __________ times a day. STRETCHING EXERCISES These exercises warm up your muscles and joints and improve the movement and flexibility of your shoulder. These exercises also help to relieve pain, numbness, and tingling. These exercises are done using your healthy shoulder to help stretch the muscles of your injured shoulder. Exercise E: Warehouse manager (External Rotation and Abduction)  1. Stand in a doorway with one of your feet slightly in front of the other. This is called a staggered stance. If you cannot reach your forearms to the door frame, stand facing a corner of a room. 2. Choose one of the following positions as told by your health care provider: ? Place your hands and forearms on the door frame above your head. ? Place your hands and forearms on the door frame at the height of your head. ? Place your hands on the door frame at the height of your elbows. 3. Slowly move your weight onto your front foot until you feel a stretch across your chest and in the front of your shoulders. Keep your head and chest upright and keep your abdominal muscles tight. 4. Hold for __________ seconds. 5. To release the stretch, shift your weight to your back foot. Repeat __________ times. Complete this stretch __________ times a day. Exercise F:Extension,  Standing 1. Stand and hold a broomstick, a cane, or a similar object behind your back. ? Your hands should be a little wider than shoulder-width apart. ? Your palms should face away from your back. 2. Keeping your elbows straight and keeping your shoulder muscles relaxed, move the stick away from your body until you feel a stretch in your shoulder. ? Avoid shrugging your shoulders while you move the stick. Keep your shoulder blade tucked down toward the middle of your back. 3. Hold for __________ seconds. 4. Slowly return to the starting position. Repeat __________ times. Complete this exercise __________ times a day. STRENGTHENING EXERCISES These exercises build strength and endurance in your shoulder. Endurance is the ability to use your muscles for a long time, even after they get tired. Exercise G:External Rotation  1. Sit in a stable chair without armrests. 2. Secure an exercise band at elbow height on your left / right side. 3. Place a soft object, such as a folded towel or a small pillow, between your left /  right upper arm and your body to move your elbow a few inches away (about 10 cm) from your side. 4. Hold the end of the band so it is tight and there is no slack. 5. Keeping your elbow pressed against the soft object, move your left / right forearm out, away from your abdomen. Keep your body steady so only your forearm moves. 6. Hold for __________ seconds. 7. Slowly return to the starting position. Repeat __________ times. Complete this exercise __________ times a day. Exercise H:Shoulder Abduction  1. Sit in a stable chair without armrests, or stand. 2. Hold a __________ weight in your left / right hand, or hold an exercise band with both hands. 3. Start with your arms straight down and your left / right palm facing in, toward your body. 4. Slowly lift your left / right hand out to your side. Do not lift your hand above shoulder height unless your health care provider tells  you that this is safe. ? Keep your arms straight. ? Avoid shrugging your shoulder while you do this movement. Keep your shoulder blade tucked down toward the middle of your back. 5. Hold for __________ seconds. 6. Slowly lower your arm, and return to the starting position. Repeat __________ times. Complete this exercise __________ times a day. Exercise I:Shoulder Extension 1. Sit in a stable chair without armrests, or stand. 2. Secure an exercise band to a stable object in front of you where it is at shoulder height. 3. Hold one end of the exercise band in each hand. Your palms should face each other. 4. Straighten your elbows and lift your hands up to shoulder height. 5. Step back, away from the secured end of the exercise band, until the band is tight and there is no slack. 6. Squeeze your shoulder blades together as you pull your hands down to the sides of your thighs. Stop when your hands are straight down by your sides. Do not let your hands go behind your body. 7. Hold for __________ seconds. 8. Slowly return to the starting position. Repeat __________ times. Complete this exercise __________ times a day. Exercise J:Standing Shoulder Row 1. Sit in a stable chair without armrests, or stand. 2. Secure an exercise band to a stable object in front of you so it is at waist height. 3. Hold one end of the exercise band in each hand. Your palms should be in a thumbs-up position. 4. Bend each of your elbows to an "L" shape (about 90 degrees) and keep your upper arms at your sides. 5. Step back until the band is tight and there is no slack. 6. Slowly pull your elbows back behind you. 7. Hold for __________ seconds. 8. Slowly return to the starting position. Repeat __________ times. Complete this exercise __________ times a day. Exercise K:Shoulder Press-Ups  1. Sit in a stable chair that has armrests. Sit upright, with your feet flat on the floor. 2. Put your hands on the armrests so your  elbows are bent and your fingers are pointing forward. Your hands should be about even with the sides of your body. 3. Push down on the armrests and use your arms to lift yourself off of the chair. Straighten your elbows and lift yourself up as much as you comfortably can. ? Move your shoulder blades down, and avoid letting your shoulders move up toward your ears. ? Keep your feet on the ground. As you get stronger, your feet should support less of your body weight  as you lift yourself up. 4. Hold for __________ seconds. 5. Slowly lower yourself back into the chair. Repeat __________ times. Complete this exercise __________ times a day. Exercise L: Wall Push-Ups  1. Stand so you are facing a stable wall. Your feet should be about one arm-length away from the wall. 2. Lean forward and place your palms on the wall at shoulder height. 3. Keep your feet flat on the floor as you bend your elbows and lean forward toward the wall. 4. Hold for __________ seconds. 5. Straighten your elbows to push yourself back to the starting position. Repeat __________ times. Complete this exercise __________ times a day. This information is not intended to replace advice given to you by your health care provider. Make sure you discuss any questions you have with your health care provider. Document Released: 04/28/2005 Document Revised: 03/08/2016 Document Reviewed: 02/23/2015 Elsevier Interactive Patient Education  2018 [employer].

## 2018-03-22 NOTE — Telephone Encounter (Signed)
Received a Prior Authorization request from [employer] for Rinvoq. Authorization has been submitted to patient's insurance via Cover My Meds. Will update once we receive a response.  11:13 AM Dorthula Nettles, CPhT

## 2018-03-22 NOTE — Progress Notes (Signed)
Pharmacy Counseling Note  Pharmacy Note  Subjective: Patient presents today to the [facility] to see Dr. Corliss Skains.  Patient seen by the pharmacist for counseling on Xeljanz/Rinvoq.  She has a needle phobia and prefers oral medications. Previous medications for rheumatoid arthritis include: injectable MTX stopped due to non-compliance, failed enbrel (3/15-5/15), failed Humira (11/17-03/18), and failed Orencia (9/18-9/19)  Objective: TB test: negative 09/22/17 Hepatitis: negative August 2014 Lipid panel: within normal limits 03/23/17  CMP     Component Value Date/Time   NA 138 01/16/2018 1005   K 4.3 01/16/2018 1005   CL 102 01/16/2018 1005   CO2 20 01/16/2018 1005   GLUCOSE 85 01/16/2018 1005   GLUCOSE 78 05/25/2017 0942   BUN 20 01/16/2018 1005   CREATININE 0.60 01/16/2018 1005   CREATININE 0.65 05/25/2017 0942   CALCIUM 9.8 01/16/2018 1005   PROT 7.2 01/16/2018 1005   ALBUMIN 4.6 01/16/2018 1005   AST 15 01/16/2018 1005   ALT 23 01/16/2018 1005   ALKPHOS 87 01/16/2018 1005   BILITOT 0.4 01/16/2018 1005   GFRNONAA 124 01/16/2018 1005   GFRNONAA 121 05/25/2017 0942   GFRAA 142 01/16/2018 1005   GFRAA 140 05/25/2017 0942    CBC    Component Value Date/Time   WBC 4.1 01/16/2018 1005   WBC 5.3 05/25/2017 0940   RBC 4.43 01/16/2018 1005   RBC 3.97 05/25/2017 0940   HGB 13.9 01/16/2018 1005   HCT 40.2 01/16/2018 1005   PLT 271 01/16/2018 1005   MCV 91 01/16/2018 1005   MCH 31.4 01/16/2018 1005   MCH 31.2 05/25/2017 0940   MCHC 34.6 01/16/2018 1005   MCHC 34.6 05/25/2017 0940   RDW 13.4 01/16/2018 1005   LYMPHSABS 1.1 01/16/2018 1005   MONOABS 62 (L) 12/22/2016 1349   EOSABS 0.0 01/16/2018 1005   BASOSABS 0.0 01/16/2018 1005    Does patient have history of diverticulitis?  No  Assessment/Plan:  Counseled patient that Jeanene Erb is a JAK inhibitor indicated for Rheumatoid Arthritis.  Counseled patient on purpose, proper use, and adverse  effects of Xeljanz/Rinvoq. Reviewed the most common adverse effects including infection, diarrhea, headaches.  Also reviewed rare adverse effects such as bowel injury and the need to contact us if she develops stomach pain during treatment.  Reviewed with patient that there is the possibility of an increased risk of malignancy but it is not well understood if this increased risk is due to the medication or the disease state. Reviewed the increased risk of DVT/PE and reviewed signs/symptoms to watch for.  Patient is a non-smoker and advised to wear compression stockings on long drives/flights. Counseled patient to avoid live vaccines while on medication   Provided patient with medication education material and answered all questions.  Patient consented to [facility].  Will upload into patient's chart.    Harriette Ohara was denied through insurance last year stating Dub Amis must be tried first.  Will try approval for Rinvoq as they have a good patient assistance program.  Patient give patient assistance forms to fill out and return to office.  Patient okay to try for Harriette Ohara if Rinvoq is not approved.  Will follow up once we receive a response on Rinvoq.  All questions were encouraged and answered.  Verlin Fester, PharmD, [facility] Clinical Pharmacist  03/22/2018 10:43 AM

## 2018-03-22 NOTE — Telephone Encounter (Signed)
Application faxed to Rinvoq complete. Awaiting response.  11:53 AM Dorthula Nettles, CPhT

## 2018-03-22 NOTE — Telephone Encounter (Signed)
Prior Authorization submitted via cover my meds for Voltaren gel. Will update once response received.

## 2018-03-27 ENCOUNTER — Telehealth: Payer: Self-pay | Admitting: Pharmacy Technician

## 2018-03-27 DIAGNOSIS — M0579 Rheumatoid arthritis with rheumatoid factor of multiple sites without organ or systems involvement: Secondary | ICD-10-CM

## 2018-03-27 NOTE — Telephone Encounter (Signed)
Submitted application for Rinvoq from [employer]. Will follow up to confirm receipt.   Will send documents to scan center.  Fax# [phone] Phone# [phone]  3:01 PM Twanda Stakes Johnney Ou, CPhT

## 2018-03-27 NOTE — Telephone Encounter (Signed)
Received a fax from [facility] regarding a prior authorization for Rinvoq. Authorization has been APPROVED from 03/23/18 to 03/24/19.   Will send document to scan center.  Authorization # [id number] Phone #[phone]  Per plan, must be filled through [facility].  10:29 AM Dorthula Nettles, CPhT

## 2018-03-29 NOTE — Telephone Encounter (Signed)
Patient advised the prior authorization for Voltaren Gel has been denied. Patient advised that she can use [email or web address]. Patient verbalized understanding

## 2018-03-29 NOTE — Telephone Encounter (Signed)
Received a fax from [place], stating that patient is being referred to the savings program for Rinvoq, because once patient's deductible is met the medication will be covered 100%, based on their benefits investigation.  Will send documents to scan center.  Phone# [phone]  [facility].  Called patient to advise. Patient's last dose of of Orencia was 03/15/18. She would like to confirm when she can start new therapy.   Patient has a meeting with a Rinvoq Ambassador to complete her enrollment on Friday 03/31/18.   1:56 PM Beatriz Chancellor, CPhT

## 2018-03-31 ENCOUNTER — Telehealth: Payer: Self-pay | Admitting: *Deleted

## 2018-03-31 ENCOUNTER — Telehealth: Payer: Self-pay | Admitting: Pharmacist

## 2018-03-31 MED ORDER — UPADACITINIB ER 15 MG PO TB24: 15.0000 mg | ORAL_TABLET | Freq: Every day | ORAL | 1 refills | Status: DC

## 2018-03-31 NOTE — Telephone Encounter (Signed)
Patient contacted the office to make sure her labs were up to date to start the Rinvoq. Patient advised all of her labs are up to date. Double checked by [facility] Pharm-[facility].

## 2018-03-31 NOTE — Addendum Note (Signed)
Addended by: Verlin Fester C on: 03/31/2018 09:22 AM   Modules accepted: Orders

## 2018-03-31 NOTE — Addendum Note (Signed)
Addended by: Verlin Fester C on: 03/31/2018 02:00 PM   Modules accepted: Orders

## 2018-03-31 NOTE — Telephone Encounter (Signed)
Called patient to follow up on Rinvoq prescription.  Patient states she met with the Rinvoq ambassador this morning.  They have set her up with their savings program specialty pharmacy and have scheduled the shipment for Wednesday.  Offered for patient to come by the office for a sample if she would like to start sooner.  All questions encouraged and answered.

## 2018-04-03 ENCOUNTER — Other Ambulatory Visit: Payer: Self-pay | Admitting: *Deleted

## 2018-04-03 ENCOUNTER — Telehealth: Payer: Self-pay | Admitting: Rheumatology

## 2018-04-03 NOTE — Telephone Encounter (Signed)
Opened in error

## 2018-04-03 NOTE — Telephone Encounter (Signed)
Patty from Rinvoq Complete Program called stating they received patient's application, but unable to accept it with a stamped signature.  Please call back with verbal authorization #[id number]

## 2018-04-27 ENCOUNTER — Telehealth: Payer: Self-pay | Admitting: Rheumatology

## 2018-04-27 DIAGNOSIS — Z79899 Other long term (current) drug therapy: Secondary | ICD-10-CM

## 2018-04-27 NOTE — Telephone Encounter (Signed)
Toniann Fail from [employer] calling to let you know Renvoke has been approved thru Sept. 26,2020. Patient has been advised, and approval was sent to [facility].

## 2018-04-27 NOTE — Telephone Encounter (Signed)
Lab Orders released.  

## 2018-04-27 NOTE — Telephone Encounter (Signed)
Patient called requesting her labwork orders be sent to [facility] in [place] this morning.  Patient plans to go this morning at 10:00 am.

## 2018-04-28 LAB — CMP14+EGFR
A/G RATIO: 1.9 (ref 1.2–2.2)
ALK PHOS: 67 IU/L (ref 39–117)
ALT: 10 IU/L (ref 0–32)
AST: 13 IU/L (ref 0–40)
Albumin: 4.5 g/dL (ref 3.5–5.5)
BUN/Creatinine Ratio: 30 — ABNORMAL HIGH (ref 9–23)
BUN: 21 mg/dL — AB (ref 6–20)
Bilirubin Total: 0.4 mg/dL (ref 0.0–1.2)
CO2: 22 mmol/L (ref 20–29)
Calcium: 9.3 mg/dL (ref 8.7–10.2)
Chloride: 104 mmol/L (ref 96–106)
Creatinine, Ser: 0.7 mg/dL (ref 0.57–1.00)
GFR calc non Af Amer: 117 mL/min/{1.73_m2} (ref >59)
GFR, EST AFRICAN AMERICAN: 135 mL/min/{1.73_m2} (ref >59)
GLUCOSE: 87 mg/dL (ref 65–99)
Globulin, Total: 2.4 g/dL (ref 1.5–4.5)
POTASSIUM: 4.2 mmol/L (ref 3.5–5.2)
Sodium: 136 mmol/L (ref 134–144)
TOTAL PROTEIN: 6.9 g/dL (ref 6.0–8.5)

## 2018-04-28 LAB — CBC WITH DIFFERENTIAL/PLATELET
BASOS ABS: 0 10*3/uL (ref 0.0–0.2)
Basos: 0 %
EOS (ABSOLUTE): 0 10*3/uL (ref 0.0–0.4)
Eos: 1 %
Hematocrit: 39 % (ref 34.0–46.6)
Hemoglobin: 13.7 g/dL (ref 11.1–15.9)
LYMPHS ABS: 1.8 10*3/uL (ref 0.7–3.1)
Lymphs: 29 %
MCH: 32.7 pg (ref 26.6–33.0)
MCHC: 35.1 g/dL (ref 31.5–35.7)
MCV: 93 fL (ref 79–97)
MONOCYTES: 10 %
MONOS ABS: 0.6 10*3/uL (ref 0.1–0.9)
NEUTROS ABS: 3.6 10*3/uL (ref 1.4–7.0)
Neutrophils: 60 %
PLATELETS: 257 10*3/uL (ref 150–450)
RBC: 4.19 x10E6/uL (ref 3.77–5.28)
RDW: 14 % (ref 12.3–15.4)
WBC: 6.1 10*3/uL (ref 3.4–10.8)

## 2018-04-28 NOTE — Telephone Encounter (Signed)
WNLs

## 2018-05-02 ENCOUNTER — Telehealth: Payer: Self-pay | Admitting: Rheumatology

## 2018-05-02 NOTE — Telephone Encounter (Signed)
Patient called stating she had her labwork on 04/27/18 and is requesting a return call with the results.  Patient states she checked her Mychart account but they are not showing up.

## 2018-05-02 NOTE — Telephone Encounter (Signed)
Left message to advise patient lab results are normal.  

## 2018-05-14 ENCOUNTER — Other Ambulatory Visit: Payer: Self-pay | Admitting: Rheumatology

## 2018-05-15 NOTE — Telephone Encounter (Signed)
Last Visit: 03/22/18 Next Visit: 06/12/18 Labs: 04/27/18 WNL  Okay to refill per Dr. Corliss Skains

## 2018-05-19 ENCOUNTER — Other Ambulatory Visit: Payer: Self-pay | Admitting: Rheumatology

## 2018-05-29 NOTE — Progress Notes (Deleted)
Office Visit Note  Patient: Shannon David             Date of Birth: [birth year]           MRN: [id number]             PCP: Eunice Blase, PA-C Referring: Eunice Blase, PA-C Visit Date: 06/12/2018 Occupation: @GUAROCC @  Subjective:  No chief complaint on file.  Rinvoq 15 mg daily, methotrexate 7 tablets or 8?  Weekly, folic acid 2 mg daily.  Last TB gold negative on 09/22/2017.  Last lipid panel within normal limits except low HDL on 03/23/2017.  Most recent CBC/CMP within normal limits on 04/27/2018.  Next CBC/CMP due in January and then every 3 months.  Standing orders placed.  Recommend annual flu and pneumonia vaccines as indicated.  X-ray of bilateral hands, feet, knees on 05/25/17  History of Present Illness: Shannon David is a 27 y.o. female with history of seropositive rheumatoid arthritis and osteoarthritis.   Activities of Daily Living:  Patient reports morning stiffness for *** {minute/hour:19697}.   Patient {ACTIONS;DENIES/REPORTS:21021675::"Denies"} nocturnal pain.  Difficulty dressing/grooming: {ACTIONS;DENIES/REPORTS:21021675::"Denies"} Difficulty climbing stairs: {ACTIONS;DENIES/REPORTS:21021675::"Denies"} Difficulty getting out of chair: {ACTIONS;DENIES/REPORTS:21021675::"Denies"} Difficulty using hands for taps, buttons, cutlery, and/or writing: {ACTIONS;DENIES/REPORTS:21021675::"Denies"}  No Rheumatology ROS completed.   PMFS History:  Patient Active Problem List   Diagnosis Date Noted  . History of PCOS 02/21/2017  . High risk medication use 09/21/2016  . Rheumatoid arthritis involving multiple sites with positive rheumatoid factor (HCC) 09/21/2016  . History of hypothyroidism 09/21/2016  . History of depression 09/21/2016    Past Medical History:  Diagnosis Date  . Arthritis     Family History  Problem Relation Age of Onset  . Hypertension Mother   . Clotting disorder Mother   . Cancer Maternal Grandmother        lung   Past Surgical History:    Procedure Laterality Date  . BUNIONECTOMY Right    Social History   Social History Narrative  . Not on file    Objective: Vital Signs: There were no vitals taken for this visit.   Physical Exam   Musculoskeletal Exam: ***  CDAI Exam: CDAI Score: Not documented Patient Global Assessment: Not documented; Provider Global Assessment: Not documented Swollen: Not documented; Tender: Not documented Joint Exam   Not documented   There is currently no information documented on the homunculus. Go to the Rheumatology activity and complete the homunculus joint exam.  Investigation: No additional findings.  Imaging: No results found.  Recent Labs: Lab Results  Component Value Date   WBC 6.1 04/27/2018   HGB 13.7 04/27/2018   PLT 257 04/27/2018   NA 136 04/27/2018   K 4.2 04/27/2018   CL 104 04/27/2018   CO2 22 04/27/2018   GLUCOSE 87 04/27/2018   BUN 21 (H) 04/27/2018   CREATININE 0.70 04/27/2018   BILITOT 0.4 04/27/2018   ALKPHOS 67 04/27/2018   AST 13 04/27/2018   ALT 10 04/27/2018   PROT 6.9 04/27/2018   ALBUMIN 4.5 04/27/2018   CALCIUM 9.3 04/27/2018   GFRAA 135 04/27/2018   QFTBGOLD Negative 08/13/2016   QFTBGOLDPLUS Negative 09/22/2017    Speciality Comments: August 2014:  Hep panel, HIV, immunoglobulin, SPEP negative  Procedures:  No procedures performed Allergies: Keflex [cephalexin]   Assessment / Plan:     Visit Diagnoses: Rheumatoid arthritis involving multiple sites with positive rheumatoid factor (HCC) -  Positive RF, positive anti-CCP, erosive disease with contractures and ulnar deviation:   High  risk medication use - Rinvoq, MTX 8 tablets po every week, folic acid 2 tablets. (Orencia, Enbrel, Humira inadequate response).    Primary osteoarthritis of both knees  Trigger finger, right ring finger  History of hypothyroidism  History of depression  History of PCOS   Orders: No orders of the defined types were placed in this  encounter.  No orders of the defined types were placed in this encounter.   Face-to-face time spent with patient was *** minutes. Greater than 50% of time was spent in counseling and coordination of care.  Follow-Up Instructions: No follow-ups on file.   Gearldine Bienenstock, PA-C  Note - This record has been created using Dragon software.  Chart creation errors have been sought, but may not always  have been located. Such creation errors do not reflect on  the standard of medical care.

## 2018-06-03 ENCOUNTER — Other Ambulatory Visit: Payer: Self-pay | Admitting: Rheumatology

## 2018-06-03 DIAGNOSIS — M0579 Rheumatoid arthritis with rheumatoid factor of multiple sites without organ or systems involvement: Secondary | ICD-10-CM

## 2018-06-05 NOTE — Telephone Encounter (Signed)
Last Visit: 03/22/18 Next Visit: 06/12/18 Labs: 04/27/18 WNL  Okay to refill per Dr. Deveshwar 

## 2018-06-08 NOTE — Progress Notes (Signed)
Office Visit Note  Patient: Shannon David             Date of Birth: [birth year]           MRN: [id number]             PCP: Eunice Blase, PA-C Referring: Eunice Blase, PA-C Visit Date: 06/22/2018 Occupation: @GUAROCC @  Subjective:  Pain in multiple joints    History of Present Illness: Shannon David is a 27 y.o. female with history of seropositive rheumatoid arthritis and osteoarthritis.  She is on Rinvoq 15 mg po daily, methotrexate 8 tablets by mouth once weekly, and folic acid 2 mg po daily. She started on Rinvoq in mid-October 2019.  Patient reports that she feels as though her ring back is very effective.  She states that she is planning pregnancy within the next 6 months.  She states that she discontinued methotrexate about 3 weeks ago and has been taking Renvela only a few times a week.  She states she has been having breakthrough pain, stiffness and joint swelling since coming off of methotrexate and spacing doses of Rinvoq.  She reports that she does not want to restart on methotrexate but would like to continue Rinvoq if possible.    Activities of Daily Living:  Patient reports morning stiffness for several hours.   Patient Reports nocturnal pain.  Difficulty dressing/grooming: Denies Difficulty climbing stairs: Denies Difficulty getting out of chair: Denies Difficulty using hands for taps, buttons, cutlery, and/or writing: Reports  Review of Systems  Constitutional: Positive for fatigue.  HENT: Negative for mouth sores, trouble swallowing, trouble swallowing, mouth dryness and nose dryness.   Eyes: Negative for pain, redness, itching, visual disturbance and dryness.  Respiratory: Negative for cough, hemoptysis, shortness of breath, wheezing and difficulty breathing.   Cardiovascular: Negative for chest pain, palpitations, hypertension and swelling in legs/feet.  Gastrointestinal: Negative for abdominal pain, blood in stool, constipation, diarrhea, nausea and vomiting.    Endocrine: Negative for increased urination.  Genitourinary: Negative for painful urination, nocturia and pelvic pain.  Musculoskeletal: Positive for arthralgias, joint pain, joint swelling and morning stiffness. Negative for myalgias, muscle weakness, muscle tenderness and myalgias.  Skin: Negative for color change, pallor, rash, hair loss, nodules/bumps, skin tightness, ulcers and sensitivity to sunlight.  Allergic/Immunologic: Negative for susceptible to infections.  Neurological: Negative for dizziness, numbness, headaches, memory loss and weakness.  Hematological: Negative for swollen glands.  Psychiatric/Behavioral: Negative for depressed mood, confusion and sleep disturbance. The patient is not nervous/anxious.     PMFS History:  Patient Active Problem List   Diagnosis Date Noted  . History of PCOS 02/21/2017  . High risk medication use 09/21/2016  . Rheumatoid arthritis involving multiple sites with positive rheumatoid factor (HCC) 09/21/2016  . History of hypothyroidism 09/21/2016  . History of depression 09/21/2016    Past Medical History:  Diagnosis Date  . Arthritis     Family History  Problem Relation Age of Onset  . Hypertension Mother   . Clotting disorder Mother   . Cancer Maternal Grandmother        lung   Past Surgical History:  Procedure Laterality Date  . BUNIONECTOMY Right    Social History   Social History Narrative  . Not on file    Objective: Vital Signs: BP 112/70 (BP Location: Left Arm, Patient Position: Sitting, Cuff Size: Normal)   Pulse 80   Resp 12   Ht 5\' 2"  (1.575 m)   Wt 163 lb 12.8 oz (74.3  kg)   BMI 29.96 kg/m    Physical Exam Vitals signs and nursing note reviewed.  Constitutional:      Appearance: She is well-developed.  HENT:     Head: Normocephalic and atraumatic.  Eyes:     Conjunctiva/sclera: Conjunctivae normal.  Neck:     Musculoskeletal: Normal range of motion.  Cardiovascular:     Rate and Rhythm: Normal rate  and regular rhythm.     Heart sounds: Normal heart sounds.  Pulmonary:     Effort: Pulmonary effort is normal.     Breath sounds: Normal breath sounds.  Abdominal:     General: Bowel sounds are normal.     Palpations: Abdomen is soft.  Lymphadenopathy:     Cervical: No cervical adenopathy.  Skin:    General: Skin is warm and dry.     Capillary Refill: Capillary refill takes less than 2 seconds.  Neurological:     Mental Status: She is alert and oriented to person, place, and time.  Psychiatric:        Behavior: Behavior normal.      Musculoskeletal Exam: C-spine, thoracic spine, lumbar spine good range of motion.  No midline spinal tenderness.  No SI joint tenderness.  Shoulder joints, elbow joints wrist joints good range of motion with no synovitis.  She has synovitis of all MCPs on the right hand and the second and third MCPs of the left hand.  She has ulnar deviation bilaterally worse in the right hand.  Hip joints good range of motion with no discomfort.  No tenderness of trochanteric bursa bilaterally.  Knee joints good range of motion with no warmth or effusion.  She has left knee joint tenderness.  Good ROM of both ankle joints. No tenderness or swelling of ankle joints.  CDAI Exam: CDAI Score: 15.8  Patient Global Assessment: 4 (mm); Provider Global Assessment: 4 (mm) Swollen: 7 ; Tender: 8  Joint Exam      Right  Left  MCP 1  Swollen Tender     MCP 2  Swollen Tender  Swollen Tender  MCP 3  Swollen Tender  Swollen Tender  MCP 4  Swollen Tender     MCP 5  Swollen Tender     Knee      Tender     Investigation: No additional findings.  Imaging: No results found.  Recent Labs: Lab Results  Component Value Date   WBC 6.1 04/27/2018   HGB 13.7 04/27/2018   PLT 257 04/27/2018   NA 136 04/27/2018   K 4.2 04/27/2018   CL 104 04/27/2018   CO2 22 04/27/2018   GLUCOSE 87 04/27/2018   BUN 21 (H) 04/27/2018   CREATININE 0.70 04/27/2018   BILITOT 0.4 04/27/2018    ALKPHOS 67 04/27/2018   AST 13 04/27/2018   ALT 10 04/27/2018   PROT 6.9 04/27/2018   ALBUMIN 4.5 04/27/2018   CALCIUM 9.3 04/27/2018   GFRAA 135 04/27/2018   QFTBGOLD Negative 08/13/2016   QFTBGOLDPLUS Negative 09/22/2017    Speciality Comments: Prior therapy: Orencia (inadequate response 9/18-9/19), Enbrel (inadequate response 3/15/5/15), Humira (inadequate response 11/17-3/18), and injectable methotrexate (non-compliance)  Procedures:  No procedures performed Allergies: Keflex [cephalexin]     Assessment / Plan:     Visit Diagnoses: Rheumatoid arthritis involving multiple sites with positive rheumatoid factor (HCC) -  Positive RF, positive anti-CCP, erosive disease with contractures and ulnar deviation: She has active synovitis and tenderness on exam.  She discontinued MTX 3 weeks ago  and has been spacing her doses of Rinvoq due to wanting to plan pregnancy within the next 6 months.  She has been having unprotected sex over the past several weeks.  We discussed the risks of conceiving while on MTX.  She was advised to avoid having unprotected sex for 3 months after discontinuing use of MTX before trying to conceive.  She will need to come off of Rinvoq 1 month prior to trying to conceive.  We discussed other treatment options including plaquenil and cimzia which are safe during pregnancy.  We will apply for Cimzia subcutaneous in-office injections through her insurance.  She will switch from MTX to Plaquenil, which is safe during pregnancy.  Indications, contraindications, and potential side effects of Cimzia and plaquenil were discussed. Questions were addressed. Consent was obtained. She will return to the office in 3 months.   Patient was counseled on the purpose, proper use, and adverse effects of hydroxychloroquine including nausea/diarrhea, skin rash, headaches, and sun sensitivity.  Discussed importance of annual eye exams while on hydroxychloroquine to monitor to ocular toxicity and  discussed importance of frequent laboratory monitoring.  Provided patient with eye exam form for baseline ophthalmologic exam.  Provided patient with educational materials on hydroxychloroquine and answered all questions.  Patient consented to hydroxychloroquine.  Will upload consent in the media tab.    Counseled patient that Cimzia is a TNF blocking agent. Counseled patient on purpose, proper use, and adverse effects of Cimzia.  Reviewed the most common adverse effects including infections, headache, and injection site reactions. Discussed that there is the possibility of an increased risk of malignancy but it is not well understood if this increased risk is due to the medication or the disease state.  Advised patient to get yearly dermatology exams due to risk of skin cancer.  Reviewed the importance of regular labs while on Cimzia therapy.  Advised patient to get standing labs one month after starting Cimzia.  Provided patient with standing lab orders.  Counseled patient that Cimzia should be held prior to scheduled surgery.  Counseled patient to avoid live vaccines while on Cimzia.  Advised patient to get annual influenza vaccine and the pneumococcal vaccine as indicated.  Provided patient with medication education material and answered all questions.  Patient voiced understanding.  Patient consented to Cimzia.  Will upload consent into the media tab.  Reviewed storage instructions for Cimzia.  Will apply for Cimzia through patient's insurance.  Advised initial injection must be administered in office.    High risk medication use -She is planning pregnancy within the next 6 months.  She was advised to discontinue Rinvoq 15 mg po daily, MTX 8 tablets po once weekly, folic acid (Enbrel, Humira, Orencia inadequte response). She needs to be off of MTX 3 months prior to trying to conceive.  She voiced understanding.  Most recent CBC/CMP within normal limit on 04/27/18.  Next CBC/CMP due in January and monitor  every 3 months.  Standing orders in place.  Most recent lipid panel on 03/23/17 within normal limits. X-ray of bilateral feet, knees, and hands taken on 05/25/17.  X-ray of left shoulder taken on 03/22/18.  Primary osteoarthritis of both knees: No warmth or effusion noted.  She has good ROM with no discomfort.  She has left knee joint tenderness today.   Trigger finger, right ring finger: Resolved.   Other medical conditions are listed as follows:   History of hypothyroidism  History of depression  History of PCOS   Orders: No  orders of the defined types were placed in this encounter.  Meds ordered this encounter  Medications  . hydroxychloroquine (PLAQUENIL) 200 MG tablet    Sig: Take 1 tablet (200 mg) in the morning and 1/2 tablet (100mg ) in the evening daily.    Dispense:  45 tablet    Refill:  2    Face-to-face time spent with patient was 30 minutes. Greater than 50% of time was spent in counseling and coordination of care.  Follow-Up Instructions: Return in about 3 months (around 09/21/2018) for Rheumatoid arthritis.   09/23/2018 PA-C  I examined and evaluated the patient with Sherron Ales PA.  Patient is doing extremely well on current regimen.  Although she is planning pregnancy.  We had detailed discussion regarding contraception for 3 months after stopping methotrexate.  She will also stop Rinvoq at least a month prior to trying to conceive.  I detailed discussion with her different treatment options.  Also during pregnancy the symptoms sometimes resolve and disease can go into remission.  We discussed that starting on Plaquenil.  Side effects were discussed she was in agreement to proceed with Plaquenil.  We will also apply for Cimzia which can be used during the pregnancy.  Side effects of Cimzia were also discussed at length.  She has failed anti-TNF's in the past.  The plan of care was discussed as noted above.  Sherron Ales, MD  Note - This record has been created  using Pollyann Savoy.  Chart creation errors have been sought, but may not always  have been located. Such creation errors do not reflect on  the standard of medical care.

## 2018-06-12 ENCOUNTER — Ambulatory Visit: Payer: 59 | Admitting: Physician Assistant

## 2018-06-22 ENCOUNTER — Telehealth: Payer: Self-pay | Admitting: Pharmacist

## 2018-06-22 ENCOUNTER — Telehealth: Payer: Self-pay

## 2018-06-22 ENCOUNTER — Ambulatory Visit (INDEPENDENT_AMBULATORY_CARE_PROVIDER_SITE_OTHER): Payer: 59 | Admitting: Rheumatology

## 2018-06-22 ENCOUNTER — Encounter: Payer: Self-pay | Admitting: Physician Assistant

## 2018-06-22 VITALS — BP 112/70 | HR 80 | Resp 12 | Ht 62.0 in | Wt 163.8 lb

## 2018-06-22 DIAGNOSIS — M170 Bilateral primary osteoarthritis of knee: Secondary | ICD-10-CM | POA: Diagnosis not present

## 2018-06-22 DIAGNOSIS — Z79899 Other long term (current) drug therapy: Secondary | ICD-10-CM

## 2018-06-22 DIAGNOSIS — Z8639 Personal history of other endocrine, nutritional and metabolic disease: Secondary | ICD-10-CM

## 2018-06-22 DIAGNOSIS — Z8742 Personal history of other diseases of the female genital tract: Secondary | ICD-10-CM

## 2018-06-22 DIAGNOSIS — M0579 Rheumatoid arthritis with rheumatoid factor of multiple sites without organ or systems involvement: Secondary | ICD-10-CM

## 2018-06-22 DIAGNOSIS — Z8659 Personal history of other mental and behavioral disorders: Secondary | ICD-10-CM

## 2018-06-22 DIAGNOSIS — M65341 Trigger finger, right ring finger: Secondary | ICD-10-CM | POA: Diagnosis not present

## 2018-06-22 MED ORDER — HYDROXYCHLOROQUINE SULFATE 200 MG PO TABS: ORAL_TABLET | ORAL | 2 refills | Status: DC

## 2018-06-22 NOTE — Telephone Encounter (Signed)
Submitted on [employer]plicity for benefits investigation.

## 2018-06-22 NOTE — Progress Notes (Signed)
Pharmacy Note  Subjective: Patient presents today to the [facility] to see Dr. Corliss Skains.   Patient seen by the pharmacist for counseling on Cimzia and Plaquenil as she is planning to start a family within the next 6 months..    Objective:  Quantiferon TB Gold Latest Ref Rng & Units 09/22/2017  Quantiferon TB Gold Plus Negative Negative   Hepatitis panel: negative 02/22/13 HIV: negative 02/22/13 SPEP: within normal limits 02/22/13 Immunoglobulins: within normal limits 02/22/13  Chest x-ray: normal 07/31/13  CBC    Component Value Date/Time   WBC 6.1 04/27/2018 1232   WBC 5.3 05/25/2017 0940   RBC 4.19 04/27/2018 1232   RBC 3.97 05/25/2017 0940   HGB 13.7 04/27/2018 1232   HCT 39.0 04/27/2018 1232   PLT 257 04/27/2018 1232   MCV 93 04/27/2018 1232   MCH 32.7 04/27/2018 1232   MCH 31.2 05/25/2017 0940   MCHC 35.1 04/27/2018 1232   MCHC 34.6 05/25/2017 0940   RDW 14.0 04/27/2018 1232   LYMPHSABS 1.8 04/27/2018 1232   MONOABS 62 (L) 12/22/2016 1349   EOSABS 0.0 04/27/2018 1232   BASOSABS 0.0 04/27/2018 1232   Does patient have diagnosis of heart failure?  No  Assessment/Plan:  Counseled patient that Cimzia is a TNF blocking agent.    Counseled patient on purpose, proper use, and adverse effects of Cimzia.  Reviewed the most common adverse effects including infections, headache, and injection site reactions. Discussed that there is the possibility of an increased risk of malignancy but it is not well understood if this increased risk is due to the medication or the disease state.  Advised patient to get yearly dermatology exams due to risk of skin cancer.  Reviewed the importance of regular labs while on Cimzia therapy.  Advised patient to get standing labs one month after starting Cimzia.  Provided patient with standing lab orders.  Counseled patient that Cimzia should be held prior to scheduled surgery.  Counseled patient to avoid live vaccines while on Cimzia.  Advised  patient to get annual influenza vaccine and the pneumococcal vaccine as indicated.  Provided patient with medication education material and answered all questions.  Patient voiced understanding.  Patient consented to Cimzia.  Will upload consent into the media tab.  Reviewed storage instructions for Cimzia.  Will apply for Cimzia through patient's insurance.  Advised initial injection must be administered in office.    Patient will also be starting Plaquenil. Patient was counseled on the purpose, proper use, and adverse effects of hydroxychloroquine including nausea/diarrhea, skin rash, headaches, and sun sensitivity.  Discussed importance of annual eye exams while on hydroxychloroquine to monitor to ocular toxicity and discussed importance of frequent laboratory monitoring.  Provided patient with eye exam form for baseline ophthalmologic exam.  Provided patient with educational materials on hydroxychloroquine and answered all questions.  Patient consented to hydroxychloroquine.  Will upload consent in the media tab.    Patient instructed she should be off methotrexate 3 months and Rinvoq 1 month prior to conception.  She plans to continue Rinvoq until Cimzia approved.  Plaquenil dose will be 200 mg in the morning and 100 mg in the evening based on weight of 74 kg.  All questions encouraged and answered.  Instructed patient to call with any further questions or concerns.  Verlin Fester, PharmD, [facility] Rheumatology Clinical Pharmacist  06/22/2018 10:05 AM

## 2018-06-22 NOTE — Telephone Encounter (Signed)
Opened in error

## 2018-06-22 NOTE — Patient Instructions (Signed)
Standing Labs We placed an order today for your standing lab work.    Please come back and get your standing labs in 1 month and then every 3 months.  We have open lab Monday through Friday from 8:30-11:30 AM and 1:30-4:00 PM  at the office of Dr. Pollyann Savoy.   You may experience shorter wait times on Monday and Friday afternoons. The office is located at [address], [place], [place] [address] No appointment is necessary.   Labs are drawn by [employer] [facility] [employer].  You may receive a bill from [place] for your lab work.  If you wish to have your labs drawn at another location, please call the office 24 hours in advance to send orders.  If you have any questions regarding directions or hours of operation,  please call [phone].   Just as a reminder please drink plenty of water prior to coming for your lab work. Thanks!  Vaccines You are taking a medication(s) that can suppress your immune system.  The following immunizations are recommended: . Flu annually . Pneumonia (Pneumovax 23 and Prevnar 13 spaced at least 1 year apart) . Shingrix  Please check with your PCP to make sure you are up to date.   Certolizumab pegol injection What is this medicine? CERTOLIZUMAB (SER toe LIZ oo mab) is used to treat rheumatoid arthritis, psoriatic arthritis, ankylosing spondylitis, non-radiographic axial spondyloarthritis, Crohn's disease, and plaque psoriasis. This medicine may be used for other purposes; ask your health care provider or pharmacist if you have questions. COMMON BRAND NAME(S): Cimzia What should I tell my health care provider before I take this medicine? They need to know if you have any of these conditions: -cancer -diabetes -Guillian-Barre syndrome -heart failure -hepatitis B or history of hepatitis B infection -immune system problems -infection or history of infections -low blood counts, like low white cell, platelet, or red cell counts -multiple  sclerosis -recently received or scheduled to receive a vaccine -tuberculosis, a positive skin test for tuberculosis or have recently been in close contact with someone who has tuberculosis -an unusual or allergic reaction to certolizumab, other medicines, latex, rubber, foods, dyes, or preservatives -pregnant or trying to get pregnant -breast-feeding How should I use this medicine? This medicine is for injection under the skin. It is usually given by a health care professional in a hospital or clinic setting. If you get this medicine at home, you will be taught how to prepare and give this medicine. Use exactly as directed. Take your medicine at regular intervals. Do not take your medicine more often than directed. It is important that you put your used needles and syringes in a special sharps container. Do not put them in a trash can. If you do not have a sharps container, call your pharmacist or healthcare provider to get one. A special MedGuide will be given to you by the pharmacist with each prescription and refill. Be sure to read this information carefully each time. Talk to your pediatrician regarding the use of this medicine in children. Special care may be needed. Overdosage: If you think you have taken too much of this medicine contact a poison control center or emergency room at once. NOTE: This medicine is only for you. Do not share this medicine with others. What if I miss a dose? It is important not to miss your dose. Call your doctor or health care professional if you are unable to keep an appointment. If you give your medicine by injection under the  skin: If you miss a dose, take it as soon as you can. If it is almost time for your next dose, take only that dose. Do not take double or extra doses. Call your doctor or health care professional if you are not sure how to handle a missed dose. What may interact with this medicine? Do not take this medicine with any of the following  medications: -biologic medicines such as abatacept, adalimumab, anakinra, etanercept, golimumab, infliximab, natalizumab, rituximab, secukinumab, tocilizumab, ustekinumab -live vaccines -tofacitinib This list may not describe all possible interactions. Give your health care provider a list of all the medicines, herbs, non-prescription drugs, or dietary supplements you use. Also tell them if you smoke, drink alcohol, or use illegal drugs. Some items may interact with your medicine. What should I watch for while using this medicine? Visit your doctor or health care professional for regular checks on your progress. Tell your doctor or healthcare professional if your symptoms do not start to get better or if they get worse. Your condition will be monitored carefully while you are receiving this medicine. You will be tested for tuberculosis (TB) before you start this medicine. If your doctor prescribes any medicine for TB, you should start taking the TB medicine before starting this medicine. Make sure to finish the full course of TB medicine. Call your doctor or health care professional for advice if you get a fever, chills, sore throat, or other symptoms of an infection. Do not treat yourself. This medicine may decrease your body's ability to fight infection. Try to avoid being around people who are sick. Talk to your doctor about your risk of cancer. You may be more at risk for certain types of cancers if you take this medicine. What side effects may I notice from receiving this medicine? Side effects that you should report to your doctor or health care professional as soon as possible: -allergic reactions like skin rash, itching or hives, swelling of the face, lips, or tongue -breathing problems -changes in vision -chest pain -joint or muscle pain -mouth sores -numbness or tingling in any part of your body -signs and symptoms of infection like fever or chills; cough; sore throat; pain or trouble  passing urine -signs and symptoms of liver injury like dark yellow or brown urine; general ill feeling or flu-like symptoms; light-colored stools; loss of appetite; nausea; right upper belly pain; unusually weak or tired; yellowing of the eyes or skin -swelling of the legs or ankles -swollen lymph nodes in the neck, underarm, or groin areas -unexplained weight loss -unusual bleeding or bruising -unusually weak or tired Side effects that usually do not require medical attention (report to your doctor or health care professional if they continue or are bothersome): -irritation at site where injected This list may not describe all possible side effects. Call your doctor for medical advice about side effects. You may report side effects to FDA at 1-800-FDA-1088. Where should I keep my medicine? Keep out of the reach of children. If you are using this medicine at home, keep the syringes in the refrigerator between 2 to 8 degrees C (36 to 46 degrees F). Do not freeze. Protect from light. Keep this medicine in the original container. Throw away any unused medicine after the expiration date on the label. NOTE: This sheet is a summary. It may not cover all possible information. If you have questions about this medicine, talk to your doctor, pharmacist, or health care provider.  2019 Elsevier/Gold Standard (2017-09-27 16:35:32) Hydroxychloroquine  tablets What is this medicine? HYDROXYCHLOROQUINE (hye drox ee KLOR oh kwin) is used to treat rheumatoid arthritis and systemic lupus erythematosus. It is also used to treat malaria. This medicine may be used for other purposes; ask your health care provider or pharmacist if you have questions. COMMON BRAND NAME(S): Plaquenil, Quineprox What should I tell my health care provider before I take this medicine? They need to know if you have any of these conditions: -diabetes -eye disease, vision problems -G6PD deficiency -history of blood diseases -history of  irregular heartbeat -if you often drink alcohol -kidney disease -liver disease -porphyria -psoriasis -seizures -an unusual or allergic reaction to chloroquine, hydroxychloroquine, other medicines, foods, dyes, or preservatives -pregnant or trying to get pregnant -breast-feeding How should I use this medicine? Take this medicine by mouth with a glass of water. Follow the directions on the prescription label. Avoid taking antacids within 4 hours of taking this medicine. It is best to separate these medicines by at least 4 hours. Do not cut, crush or chew this medicine. You can take it with or without food. If it upsets your stomach, take it with food. Take your medicine at regular intervals. Do not take your medicine more often than directed. Take all of your medicine as directed even if you think you are better. Do not skip doses or stop your medicine early. Talk to your pediatrician regarding the use of this medicine in children. While this drug may be prescribed for selected conditions, precautions do apply. Overdosage: If you think you have taken too much of this medicine contact a poison control center or emergency room at once. NOTE: This medicine is only for you. Do not share this medicine with others. What if I miss a dose? If you miss a dose, take it as soon as you can. If it is almost time for your next dose, take only that dose. Do not take double or extra doses. What may interact with this medicine? Do not take this medicine with any of the following medications: -cisapride -dofetilide -dronedarone -live virus vaccines -penicillamine -pimozide -thioridazine -ziprasidone This medicine may also interact with the following medications: -ampicillin -antacids -cimetidine -cyclosporine -digoxin -medicines for diabetes, like insulin, glipizide, glyburide -medicines for seizures like carbamazepine, phenobarbital, phenytoin -mefloquine -methotrexate -other medicines that prolong  the QT interval (cause an abnormal heart rhythm) -praziquantel This list may not describe all possible interactions. Give your health care provider a list of all the medicines, herbs, non-prescription drugs, or dietary supplements you use. Also tell them if you smoke, drink alcohol, or use illegal drugs. Some items may interact with your medicine. What should I watch for while using this medicine? Tell your doctor or healthcare professional if your symptoms do not start to get better or if they get worse. Avoid taking antacids within 4 hours of taking this medicine. It is best to separate these medicines by at least 4 hours. Tell your doctor or health care professional right away if you have any change in your eyesight. Your vision and blood may be tested before and during use of this medicine. This medicine can make you more sensitive to the sun. Keep out of the sun. If you cannot avoid being in the sun, wear protective clothing and use sunscreen. Do not use sun lamps or tanning beds/booths. What side effects may I notice from receiving this medicine? Side effects that you should report to your doctor or health care professional as soon as possible: -allergic reactions like skin rash,  itching or hives, swelling of the face, lips, or tongue -changes in vision -decreased hearing or ringing of the ears -redness, blistering, peeling or loosening of the skin, including inside the mouth -seizures -sensitivity to light -signs and symptoms of a dangerous change in heartbeat or heart rhythm like chest pain; dizziness; fast or irregular heartbeat; palpitations; feeling faint or lightheaded, falls; breathing problems -signs and symptoms of liver injury like dark yellow or brown urine; general ill feeling or flu-like symptoms; light-colored stools; loss of appetite; nausea; right upper belly pain; unusually weak or tired; yellowing of the eyes or skin -signs and symptoms of low blood sugar such as feeling  anxious; confusion; dizziness; increased hunger; unusually weak or tired; sweating; shakiness; cold; irritable; headache; blurred vision; fast heartbeat; loss of consciousness -uncontrollable head, mouth, neck, arm, or leg movements Side effects that usually do not require medical attention (report to your doctor or health care professional if they continue or are bothersome): -anxious -diarrhea -dizziness -hair loss -headache -irritable -loss of appetite -nausea, vomiting -stomach pain This list may not describe all possible side effects. Call your doctor for medical advice about side effects. You may report side effects to FDA at 1-800-FDA-1088. Where should I keep my medicine? Keep out of the reach of children. In children, this medicine can cause overdose with small doses. Store at room temperature between 15 and 30 degrees C (59 and 86 degrees F). Protect from moisture and light. Throw away any unused medicine after the expiration date. NOTE: This sheet is a summary. It may not cover all possible information. If you have questions about this medicine, talk to your doctor, pharmacist, or health care provider.  2019 Elsevier/Gold Standard (2016-01-28 14:16:15)

## 2018-06-22 NOTE — Telephone Encounter (Signed)
Please apply for in office Cimzia, per Ladona Ridgel and Dr. Corliss Skains. Patient is trying to conceive. Thanks!

## 2018-06-23 ENCOUNTER — Encounter: Payer: Self-pay | Admitting: Rheumatology

## 2018-06-23 NOTE — Telephone Encounter (Signed)
Please advise patient to hold Rinvoq 1 month prior to conceiving.  She should be off of MTX for at least 3 months prior to planning pregnancy as well.  Please advise her to notify us if she changes her mind about switching to PLQ and/or Cimzia.

## 2018-06-23 NOTE — Telephone Encounter (Signed)
Cimzia covered by [employer]. Not Prior Authorization required.  CPT Code 99833 is valid and billable at 10 % coinsurance CPT Cide [phone] is valid and billable at at 10 % coinsurance.   Ind. Deductible $2800 Savings Card Eligable.  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.

## 2018-07-03 ENCOUNTER — Encounter: Payer: Self-pay | Admitting: Rheumatology

## 2018-07-04 ENCOUNTER — Telehealth: Payer: Self-pay | Admitting: *Deleted

## 2018-07-04 NOTE — Telephone Encounter (Signed)
error 

## 2018-07-17 ENCOUNTER — Telehealth: Payer: Self-pay | Admitting: Rheumatology

## 2018-07-17 DIAGNOSIS — Z79899 Other long term (current) drug therapy: Secondary | ICD-10-CM

## 2018-07-17 NOTE — Telephone Encounter (Signed)
Lab orders released.  

## 2018-07-17 NOTE — Telephone Encounter (Signed)
Patient called requesting her labwork orders be sent to [facility] in [place] today.

## 2018-07-23 ENCOUNTER — Other Ambulatory Visit: Payer: Self-pay | Admitting: Rheumatology

## 2018-07-23 ENCOUNTER — Encounter: Payer: Self-pay | Admitting: Rheumatology

## 2018-07-24 NOTE — Telephone Encounter (Signed)
Last Visit: 06/22/18 Next Visit: 09/21/18  Okay to refill per Dr. Corliss Skains

## 2018-08-02 ENCOUNTER — Encounter: Payer: Self-pay | Admitting: Rheumatology

## 2018-08-03 MED ORDER — PREDNISONE 5 MG PO TABS: ORAL_TABLET | ORAL | 0 refills | Status: DC

## 2018-08-03 NOTE — Telephone Encounter (Signed)
Attempted to contact patient and unable to leave a message mailbox is full.  

## 2018-08-03 NOTE — Telephone Encounter (Signed)
Ok to give the work note.  

## 2018-08-07 ENCOUNTER — Emergency Department (HOSPITAL_COMMUNITY)
Admission: EM | Admit: 2018-08-07 | Discharge: 2018-08-07 | Disposition: A | Discharge status: Home / Self Care | Payer: 59 | Attending: Emergency Medicine | Admitting: Emergency Medicine

## 2018-08-07 ENCOUNTER — Encounter: Payer: Self-pay | Admitting: *Deleted

## 2018-08-07 ENCOUNTER — Encounter (HOSPITAL_COMMUNITY): Payer: Self-pay

## 2018-08-07 ENCOUNTER — Emergency Department (HOSPITAL_COMMUNITY): Payer: 59

## 2018-08-07 ENCOUNTER — Other Ambulatory Visit: Payer: Self-pay

## 2018-08-07 DIAGNOSIS — M0579 Rheumatoid arthritis with rheumatoid factor of multiple sites without organ or systems involvement: Secondary | ICD-10-CM | POA: Insufficient documentation

## 2018-08-07 DIAGNOSIS — R05 Cough: Secondary | ICD-10-CM | POA: Diagnosis not present

## 2018-08-07 DIAGNOSIS — Z79899 Other long term (current) drug therapy: Secondary | ICD-10-CM | POA: Diagnosis not present

## 2018-08-07 DIAGNOSIS — R079 Chest pain, unspecified: Secondary | ICD-10-CM | POA: Diagnosis not present

## 2018-08-07 DIAGNOSIS — R0789 Other chest pain: Secondary | ICD-10-CM | POA: Diagnosis not present

## 2018-08-07 LAB — CBC
HCT: 40.9 % (ref 36.0–46.0)
HEMOGLOBIN: 13.3 g/dL (ref 12.0–15.0)
MCH: 30.9 pg (ref 26.0–34.0)
MCHC: 32.5 g/dL (ref 30.0–36.0)
MCV: 94.9 fL (ref 80.0–100.0)
Platelets: 230 10*3/uL (ref 150–400)
RBC: 4.31 MIL/uL (ref 3.87–5.11)
RDW: 12.2 % (ref 11.5–15.5)
WBC: 3.9 10*3/uL — ABNORMAL LOW (ref 4.0–10.5)
nRBC: 0 % (ref 0.0–0.2)

## 2018-08-07 LAB — BASIC METABOLIC PANEL
Anion gap: 13 (ref 5–15)
BUN: 17 mg/dL (ref 6–20)
CO2: 20 mmol/L — ABNORMAL LOW (ref 22–32)
Calcium: 9.4 mg/dL (ref 8.9–10.3)
Chloride: 104 mmol/L (ref 98–111)
Creatinine, Ser: 0.71 mg/dL (ref 0.44–1.00)
GFR calc Af Amer: >60 mL/min (ref >60)
GFR calc non Af Amer: >60 mL/min (ref >60)
Glucose, Bld: 93 mg/dL (ref 70–99)
POTASSIUM: 4 mmol/L (ref 3.5–5.1)
Sodium: 137 mmol/L (ref 135–145)

## 2018-08-07 LAB — I-STAT TROPONIN, ED
Troponin i, poc: 0 ng/mL (ref 0.00–0.08)
Troponin i, poc: 0 ng/mL (ref 0.00–0.08)

## 2018-08-07 MED ORDER — ALUM & MAG HYDROXIDE-SIMETH 200-200-20 MG/5ML PO SUSP
30.0000 mL | Freq: Once | ORAL | Status: AC
  Administered 2018-08-07: 30 mL via ORAL
  Filled 2018-08-07: qty 30

## 2018-08-07 MED ORDER — LIDOCAINE VISCOUS HCL 2 % MT SOLN
15.0000 mL | Freq: Once | OROMUCOSAL | Status: AC
  Administered 2018-08-07: 15 mL via ORAL
  Filled 2018-08-07: qty 15

## 2018-08-07 MED ORDER — ASPIRIN 81 MG PO CHEW
324.0000 mg | CHEWABLE_TABLET | Freq: Once | ORAL | Status: AC
  Administered 2018-08-07: 324 mg via ORAL
  Filled 2018-08-07: qty 4

## 2018-08-07 NOTE — ED Triage Notes (Signed)
Patient complains of chest pain that started this am with movement and cough, denies illness, NAD

## 2018-08-07 NOTE — ED Provider Notes (Signed)
[facility] EMERGENCY DEPARTMENT Provider Note   CSN: [id number] Arrival date & time: 08/07/18  0272     History   Chief Complaint Chief Complaint  Patient presents with  . Chest Pain    HPI Shannon David is a 28 y.o. female.  She presents with a complaint of sharp chest pain substernal and left-sided since waking up this morning at 4 AM.  She never had this before.  It is worse with walking and cough.  She does not feel short of breath dizzy lightheaded nauseous or sweaty.  She is tried nothing for this.  No history of young heart disease in her family no personal history of coronary disease.  Denies smoking or drug use.  No trauma  The history is provided by the patient.  Chest Pain  Pain location:  Substernal area and L chest Pain quality: stabbing   Pain radiates to:  Does not radiate Pain severity:  Moderate Onset quality:  Sudden Duration:  3 hours Timing:  Intermittent Progression:  Unchanged Chronicity:  New Relieved by:  Nothing Worsened by:  Nothing Ineffective treatments:  None tried Associated symptoms: no abdominal pain, no diaphoresis, no fever, no heartburn, no nausea, no shortness of breath, no syncope and no weakness     Past Medical History:  Diagnosis Date  . Arthritis     Patient Active Problem List   Diagnosis Date Noted  . History of PCOS 02/21/2017  . High risk medication use 09/21/2016  . Rheumatoid arthritis involving multiple sites with positive rheumatoid factor (HCC) 09/21/2016  . History of hypothyroidism 09/21/2016  . History of depression 09/21/2016    Past Surgical History:  Procedure Laterality Date  . BUNIONECTOMY Right      OB History   No obstetric history on file.      Home Medications    Prior to Admission medications   Medication Sig Start Date End Date Taking? Authorizing Provider  diclofenac sodium (VOLTAREN) 1 % GEL Apply 3 grams to 3 large joints up to 3 times daily 03/22/18   Gearldine Bienenstock,  PA-C  folic acid (FOLVITE) 1 MG tablet Take 2 mg by mouth daily.    [provider]  hydroxychloroquine (PLAQUENIL) 200 MG tablet Take 1 tablet (200 mg) in the morning and 1/2 tablet ( ) in the evening daily. 06/22/18   Pollyann Savoy, MD  ondansetron (ZOFRAN) 4 MG tablet TAKE 1 TABLET (4 MG TOTAL) BY MOUTH AS NEEDED. 07/24/18   Pollyann Savoy, MD  predniSONE (DELTASONE) 5 MG tablet Take 4 tabs po x 4 days, 3  tabs po x 4 days, 2  tabs po x 4 days, 1  tab po x 4 days 08/03/18   Pollyann Savoy, MD  RINVOQ 15 MG TB24 TAKE 1 TABLET BY MOUTH DAILY. 06/05/18   Pollyann Savoy, MD    Family History Family History  Problem Relation Age of Onset  . Hypertension Mother   . Clotting disorder Mother   . Cancer Maternal Grandmother        lung    Social History Social History   Tobacco Use  . Smoking status: Never Smoker  . Smokeless tobacco: Never Used  Substance Use Topics  . Alcohol use: No  . Drug use: No     Allergies   Keflex [cephalexin]   Review of Systems Review of Systems  Constitutional: Negative for diaphoresis and fever.  HENT: Negative for sore throat.   Eyes: Negative for visual disturbance.  Respiratory:  Negative for shortness of breath.   Cardiovascular: Positive for chest pain. Negative for syncope.  Gastrointestinal: Negative for abdominal pain, heartburn and nausea.  Genitourinary: Negative for dysuria.  Musculoskeletal: Negative for neck pain.  Skin: Negative for rash.  Neurological: Negative for weakness.     Physical Exam Updated Vital Signs BP 129/79   Pulse 71   Temp 97.9 F (36.6 C)   Resp 18   Ht 5\' 2"  (1.575 m)   Wt 74.3 kg   SpO2 100%   BMI 29.96 kg/m   Physical Exam Vitals signs and nursing note reviewed.  Constitutional:      General: She is not in acute distress.    Appearance: She is well-developed.  HENT:     Head: Normocephalic and atraumatic.  Eyes:     Conjunctiva/sclera: Conjunctivae normal.  Neck:      Musculoskeletal: Neck supple.  Cardiovascular:     Rate and Rhythm: Normal rate and regular rhythm.     Heart sounds: No murmur.  Pulmonary:     Effort: Pulmonary effort is normal. No respiratory distress.     Breath sounds: Normal breath sounds.  Abdominal:     Palpations: Abdomen is soft.     Tenderness: There is no abdominal tenderness.  Musculoskeletal:        General: No deformity or signs of injury.     Right lower leg: No edema.     Left lower leg: No edema.  Skin:    General: Skin is warm and dry.     Capillary Refill: Capillary refill takes less than 2 seconds.  Neurological:     General: No focal deficit present.     Mental Status: She is alert.      ED Treatments / Results  Labs (all labs ordered are listed, but only abnormal results are displayed) Labs Reviewed  BASIC METABOLIC PANEL - Abnormal; Notable for the following components:      Result Value   CO2 20 (*)    All other components within normal limits  CBC - Abnormal; Notable for the following components:   WBC 3.9 (*)    All other components within normal limits  I-STAT TROPONIN, ED  I-STAT TROPONIN, ED    EKG EKG Interpretation  Date/Time:  Monday August 07 2018 07:16:41 EST Ventricular Rate:  76 PR Interval:    QRS Duration: 94 QT Interval:  379 QTC Calculation: 427 R Axis:   66 Text Interpretation:  Sinus rhythm no acute st/ts no prior to compare with Confirmed by Meridee ScoreButler, Aliani Caccavale [phone]([id number]) on 08/07/2018 8:57:16 AM Also confirmed by Meridee ScoreButler, Zahirah Cheslock [phone]), editor Sheppard EvensSimpson, Miranda ([phone])  on 08/07/2018 12:32:34 PM   Radiology Dg Chest 2 View  Result Date: 08/07/2018 CLINICAL DATA:  Chest pain and cough EXAM: CHEST - 2 VIEW COMPARISON:  None. FINDINGS: Lungs are clear. Heart size and pulmonary vascularity are normal. No adenopathy. No bone lesions. IMPRESSION: No edema or consolidation. Electronically Signed   By: Bretta BangWilliam  Woodruff III M.D.   On: 08/07/2018 08:08    Procedures Procedures  (including critical care time)  Medications Ordered in ED Medications  aspirin chewable tablet 324 mg (324 mg Oral Given 08/07/18 0737)  alum & mag hydroxide-simeth (MAALOX/MYLANTA) 200-200-20 MG/5ML suspension 30 mL (30 mLs Oral Given 08/07/18 0850)    And  lidocaine (XYLOCAINE) 2 % viscous mouth solution 15 mL (15 mLs Oral Given 08/07/18 0850)     Initial Impression / Assessment and Plan / ED Course  I  have reviewed the triage vital signs and the nursing notes.  Pertinent labs & imaging results that were available during my care of the patient were reviewed by me and considered in my medical decision making (see chart for details).  Clinical Course as of Aug 07 1734  Mon Aug 07, 2018  [id number] EKG today is normal sinus rhythm rate of 76 no acute ST-T changes no prior to compare with.   [MB]  [id number] She is PERC negative.  Her heart score is 0.   [MB]  [id number] Reevaluation-she has no pain at rest.  She said she did notice pain when she was walking to get her chest x-ray.  She got the GI cocktail now so she does not know if it is improved or not.   [MB]  1107 Patient did a trending pulse ox and stated 100%.  She says she still had a little bit of chest discomfort while she was walking.  I recommended that she do ibuprofen then and her delta troponin is negative so we will discharge.   [MB]    Clinical Course User Index [MB] Terrilee FilesButler, Illa Enlow C, MD     Final Clinical Impressions(s) / ED Diagnoses   Final diagnoses:  Nonspecific chest pain    ED Discharge Orders    None       Terrilee FilesButler, Alexei Ey C, MD 08/07/18 1736

## 2018-08-07 NOTE — ED Notes (Signed)
Patient verbalizes understanding of discharge instructions. Opportunity for questioning and answers were provided. 

## 2018-08-07 NOTE — ED Notes (Signed)
Patient 02 was 100% while walking.

## 2018-08-07 NOTE — Discharge Instructions (Addendum)
You were seen in the emergency department for some left-sided chest pain.  You had blood work EKG and a chest x-ray that did not show an obvious cause of your symptoms.  We recommend that you try some Tylenol or ibuprofen as needed.  Please follow-up with your regular doctor and return if any worsening symptoms.

## 2018-08-14 ENCOUNTER — Telehealth (INDEPENDENT_AMBULATORY_CARE_PROVIDER_SITE_OTHER): Payer: Self-pay | Admitting: *Deleted

## 2018-08-14 NOTE — Telephone Encounter (Signed)
Patient called, having wisdom tooth extraction 08/14/2018, patient will be on antibiotics, per Dr. Corliss Skains, stop the MTX until finished with antibiotics.

## 2018-08-23 DIAGNOSIS — J00 Acute nasopharyngitis [common cold]: Secondary | ICD-10-CM | POA: Diagnosis not present

## 2018-08-26 DIAGNOSIS — J00 Acute nasopharyngitis [common cold]: Secondary | ICD-10-CM | POA: Diagnosis not present

## 2018-08-27 DIAGNOSIS — J029 Acute pharyngitis, unspecified: Secondary | ICD-10-CM | POA: Diagnosis not present

## 2018-08-27 DIAGNOSIS — J0100 Acute maxillary sinusitis, unspecified: Secondary | ICD-10-CM | POA: Diagnosis not present

## 2018-08-29 ENCOUNTER — Telehealth: Payer: Self-pay | Admitting: Rheumatology

## 2018-08-29 NOTE — Telephone Encounter (Signed)
Patient paid $25.00 in cash for a FMLA form that was sent to [employer] on 08/29/2018. AB

## 2018-09-02 DIAGNOSIS — J0100 Acute maxillary sinusitis, unspecified: Secondary | ICD-10-CM | POA: Diagnosis not present

## 2018-09-15 ENCOUNTER — Telehealth: Payer: Self-pay | Admitting: Pharmacist

## 2018-09-15 NOTE — Telephone Encounter (Signed)
Left voicemail informing patient of potential Plaquenil shortage.  Unsure if she is still taking and asked for her to call the office so we can send in a refill.

## 2018-09-19 DIAGNOSIS — M2041 Other hammer toe(s) (acquired), right foot: Secondary | ICD-10-CM | POA: Diagnosis not present

## 2018-09-19 DIAGNOSIS — M7751 Other enthesopathy of right foot: Secondary | ICD-10-CM | POA: Diagnosis not present

## 2018-09-19 DIAGNOSIS — M25571 Pain in right ankle and joints of right foot: Secondary | ICD-10-CM | POA: Diagnosis not present

## 2018-09-20 NOTE — Progress Notes (Deleted)
Office Visit Note  Patient: Shannon David             Date of Birth: January 18, 1991           MRN: [id number]             PCP: Eunice Blase, PA-C Referring: Eunice Blase, PA-C Visit Date: 09/21/2018 Occupation: @GUAROCC @  Subjective:  No chief complaint on file.   History of Present Illness: Shannon David is a 28 y.o. female ***   Activities of Daily Living:  Patient reports morning stiffness for *** {minute/hour:19697}.   Patient {ACTIONS;DENIES/REPORTS:21021675::"Denies"} nocturnal pain.  Difficulty dressing/grooming: {ACTIONS;DENIES/REPORTS:21021675::"Denies"} Difficulty climbing stairs: {ACTIONS;DENIES/REPORTS:21021675::"Denies"} Difficulty getting out of chair: {ACTIONS;DENIES/REPORTS:21021675::"Denies"} Difficulty using hands for taps, buttons, cutlery, and/or writing: {ACTIONS;DENIES/REPORTS:21021675::"Denies"}  No Rheumatology ROS completed.   PMFS History:  Patient Active Problem List   Diagnosis Date Noted  . History of PCOS 02/21/2017  . High risk medication use 09/21/2016  . Rheumatoid arthritis involving multiple sites with positive rheumatoid factor (HCC) 09/21/2016  . History of hypothyroidism 09/21/2016  . History of depression 09/21/2016    Past Medical History:  Diagnosis Date  . Arthritis     Family History  Problem Relation Age of Onset  . Hypertension Mother   . Clotting disorder Mother   . Cancer Maternal Grandmother        lung   Past Surgical History:  Procedure Laterality Date  . BUNIONECTOMY Right    Social History   Social History Narrative  . Not on file    There is no immunization history on file for this patient.   Objective: Vital Signs: There were no vitals taken for this visit.   Physical Exam   Musculoskeletal Exam: ***  CDAI Exam: CDAI Score: Not documented Patient Global Assessment: Not documented; Provider Global Assessment: Not documented Swollen: Not documented; Tender: Not documented Joint Exam   Not  documented   There is currently no information documented on the homunculus. Go to the Rheumatology activity and complete the homunculus joint exam.  Investigation: No additional findings.  Imaging: No results found.  Recent Labs: Lab Results  Component Value Date   WBC 3.9 (L) 08/07/2018   HGB 13.3 08/07/2018   PLT 230 08/07/2018   NA 137 08/07/2018   K 4.0 08/07/2018   CL 104 08/07/2018   CO2 20 (L) 08/07/2018   GLUCOSE 93 08/07/2018   BUN 17 08/07/2018   CREATININE 0.71 08/07/2018   BILITOT 0.4 04/27/2018   ALKPHOS 67 04/27/2018   AST 13 04/27/2018   ALT 10 04/27/2018   PROT 6.9 04/27/2018   ALBUMIN 4.5 04/27/2018   CALCIUM 9.4 08/07/2018   GFRAA >60 08/07/2018   QFTBGOLD Negative 08/13/2016   QFTBGOLDPLUS Negative 09/22/2017    Speciality Comments: Prior therapy: Orencia (inadequate response 9/18-9/19), Enbrel (inadequate response 3/15/5/15), Humira (inadequate response 11/17-3/18), and injectable methotrexate (non-compliance)  Procedures:  No procedures performed Allergies: Keflex [cephalexin]   Assessment / Plan:     Visit Diagnoses: No diagnosis found.   Orders: No orders of the defined types were placed in this encounter.  No orders of the defined types were placed in this encounter.   Face-to-face time spent with patient was *** minutes. Greater than 50% of time was spent in counseling and coordination of care.  Follow-Up Instructions: No follow-ups on file.   Ellen Henri, CMA  Note - This record has been created using Animal nutritionist.  Chart creation errors have been sought, but may not always  have  been located. Such creation errors do not reflect on  the standard of medical care.

## 2018-09-21 ENCOUNTER — Ambulatory Visit: Payer: 59 | Admitting: Physician Assistant

## 2018-09-22 ENCOUNTER — Encounter: Payer: Self-pay | Admitting: Rheumatology

## 2018-09-22 ENCOUNTER — Other Ambulatory Visit: Payer: Self-pay | Admitting: Rheumatology

## 2018-09-22 NOTE — Telephone Encounter (Addendum)
Last Visit: 06/22/18 Next Visit: 10/30/18 Labs: 08/07/18 WBC 3.9 CO2 20 Left message to advise patient we need her PLQ eye exam  Okay to refill per Dr. Corliss Skains

## 2018-09-27 NOTE — Telephone Encounter (Signed)
We can state in the letter that patient is on immunosuppressive medications and we advised her to work from home.  If that is not an option at her work then it will be employer's decision to let her stay at home.  She can also had a letter from her podiatrist for the time off for the next 12 days while she is on steroid taper.

## 2018-10-09 DIAGNOSIS — E039 Hypothyroidism, unspecified: Secondary | ICD-10-CM | POA: Diagnosis not present

## 2018-10-09 DIAGNOSIS — Z309 Encounter for contraceptive management, unspecified: Secondary | ICD-10-CM | POA: Diagnosis not present

## 2018-10-09 DIAGNOSIS — D509 Iron deficiency anemia, unspecified: Secondary | ICD-10-CM | POA: Diagnosis not present

## 2018-10-09 DIAGNOSIS — E785 Hyperlipidemia, unspecified: Secondary | ICD-10-CM | POA: Diagnosis not present

## 2018-10-09 DIAGNOSIS — Z79899 Other long term (current) drug therapy: Secondary | ICD-10-CM | POA: Diagnosis not present

## 2018-10-16 ENCOUNTER — Encounter: Payer: Self-pay | Admitting: Rheumatology

## 2018-10-16 ENCOUNTER — Other Ambulatory Visit: Payer: Self-pay | Admitting: Rheumatology

## 2018-10-16 DIAGNOSIS — M0579 Rheumatoid arthritis with rheumatoid factor of multiple sites without organ or systems involvement: Secondary | ICD-10-CM

## 2018-10-16 NOTE — Telephone Encounter (Signed)
Last Visit: 06/22/2018 Next Visit: 10/30/2018 Labs: 08/07/2018 CBC, BMP  Okay to refill per Dr. Corliss Skains.

## 2018-10-16 NOTE — Telephone Encounter (Signed)
I returned patient's call.  She was advised to use a mask while she is at work and stay in an isolated area.  Sanitizing her hands frequently was also discussed.  She does not have an option to work from home.  Please give her a letter to return to work with the above restrictions.

## 2018-10-18 ENCOUNTER — Telehealth: Payer: Self-pay | Admitting: Rheumatology

## 2018-10-18 ENCOUNTER — Other Ambulatory Visit: Payer: Self-pay | Admitting: Rheumatology

## 2018-10-18 DIAGNOSIS — Z79899 Other long term (current) drug therapy: Secondary | ICD-10-CM

## 2018-10-18 NOTE — Telephone Encounter (Signed)
Patient going to [facility] in [place] for labs. Please release orders.

## 2018-10-18 NOTE — Telephone Encounter (Signed)
Lab orders have been released for [facility].  

## 2018-10-18 NOTE — Telephone Encounter (Deleted)
Last Visit: 06/22/2018 Next Visit: 10/30/2018 Labs: 08/07/2018 CBC, BMP  Okay to refill per Dr. Deveshwar.  

## 2018-10-19 ENCOUNTER — Encounter: Payer: Self-pay | Admitting: Rheumatology

## 2018-10-20 DIAGNOSIS — Z79899 Other long term (current) drug therapy: Secondary | ICD-10-CM | POA: Diagnosis not present

## 2018-10-20 NOTE — Telephone Encounter (Signed)
Spoke with patient and advised her that we do not have any information we would be able to give short term disability in order to have it approved as she is physically able to work by just needing restrictions due to being immunocompromised. Per Dr. Corliss Skains she will need to use a mask while she is at work and stay in an isolated area.  Sanitizing her hands frequently. Patient states the current area she works in at her job is not an isolate area. Patient will contact  Her job to see if there is another area she may be able to work,.

## 2018-10-22 LAB — CMP14+EGFR
ALT: 13 IU/L (ref 0–32)
AST: 14 IU/L (ref 0–40)
Albumin/Globulin Ratio: 1.8 (ref 1.2–2.2)
Albumin: 4.4 g/dL (ref 3.9–5.0)
Alkaline Phosphatase: 55 IU/L (ref 39–117)
BUN/Creatinine Ratio: 26 — ABNORMAL HIGH (ref 9–23)
BUN: 16 mg/dL (ref 6–20)
Bilirubin Total: 0.4 mg/dL (ref 0.0–1.2)
CO2: 19 mmol/L — ABNORMAL LOW (ref 20–29)
Calcium: 9.2 mg/dL (ref 8.7–10.2)
Chloride: 104 mmol/L (ref 96–106)
Creatinine, Ser: 0.61 mg/dL (ref 0.57–1.00)
GFR calc Af Amer: 142 mL/min/{1.73_m2} (ref >59)
GFR calc non Af Amer: 123 mL/min/{1.73_m2} (ref >59)
Globulin, Total: 2.5 g/dL (ref 1.5–4.5)
Glucose: 79 mg/dL (ref 65–99)
Potassium: 4.4 mmol/L (ref 3.5–5.2)
Sodium: 135 mmol/L (ref 134–144)
Total Protein: 6.9 g/dL (ref 6.0–8.5)

## 2018-10-22 LAB — CBC WITH DIFFERENTIAL/PLATELET
Basophils Absolute: 0 10*3/uL (ref 0.0–0.2)
Basos: 1 %
EOS (ABSOLUTE): 0 10*3/uL (ref 0.0–0.4)
Eos: 0 %
Hematocrit: 35 % (ref 34.0–46.6)
Hemoglobin: 12.3 g/dL (ref 11.1–15.9)
Immature Grans (Abs): 0 10*3/uL (ref 0.0–0.1)
Immature Granulocytes: 0 %
Lymphocytes Absolute: 1.6 10*3/uL (ref 0.7–3.1)
Lymphs: 32 %
MCH: 31.9 pg (ref 26.6–33.0)
MCHC: 35.1 g/dL (ref 31.5–35.7)
MCV: 91 fL (ref 79–97)
Monocytes Absolute: 0.4 10*3/uL (ref 0.1–0.9)
Monocytes: 8 %
Neutrophils Absolute: 3 10*3/uL (ref 1.4–7.0)
Neutrophils: 59 %
Platelets: 282 10*3/uL (ref 150–450)
RBC: 3.86 x10E6/uL (ref 3.77–5.28)
RDW: 12.7 % (ref 11.7–15.4)
WBC: 5.1 10*3/uL (ref 3.4–10.8)

## 2018-10-22 LAB — QUANTIFERON-TB GOLD PLUS
QuantiFERON Mitogen Value: 4.4 IU/mL
QuantiFERON Nil Value: 0.01 IU/mL
QuantiFERON TB1 Ag Value: 0.02 IU/mL
QuantiFERON TB2 Ag Value: 0.01 IU/mL
QuantiFERON-TB Gold Plus: NEGATIVE

## 2018-10-23 NOTE — Telephone Encounter (Signed)
stable °

## 2018-10-23 NOTE — Progress Notes (Signed)
Virtual Visit via Video Note  I connected with Shannon David on 10/23/18 at  9:45 AM EDT by a video enabled telemedicine application and verified that I am speaking with the correct person using two identifiers.   I discussed the limitations of evaluation and management by telemedicine and the availability of in person appointments. The patient expressed understanding and agreed to proceed.  CC: Medication monitoring   History of Present Illness: Patient is a 28 year old female with a past medical history of seropositive rheumatoid arthritis and osteoarthritis. She is taking Rinvoq 15 mg po daily, MTX 7 tablets po once weekly, folic acid 2 mg po daily.  She takes tylenol very sparingly.  She is no longer planning pregnancy.  She feels this combination of medications have been effective. She reports last week she experienced bilateral knee joint pain.  She denies any joint pain or joint swelling today.  She states her morning stiffness has improved.   Review of Systems  Constitutional: Positive for malaise/fatigue. Negative for fever.  Eyes: Negative for photophobia, pain, discharge and redness.       +Dry eyes  Respiratory: Negative for cough, shortness of breath and wheezing.   Cardiovascular: Negative for chest pain and palpitations.  Gastrointestinal: Negative for blood in stool, constipation and diarrhea.  Genitourinary: Negative for dysuria.  Musculoskeletal: Negative for back pain, joint pain, myalgias and neck pain.  Skin: Negative for rash.  Neurological: Negative for dizziness and headaches.  Psychiatric/Behavioral: Negative for depression. The patient is not nervous/anxious and does not have insomnia.       Observations/Objective: Physical Exam  Constitutional: She is oriented to person, place, and time and well-developed, well-nourished, and in no distress.  HENT:  Head: Normocephalic and atraumatic.  Eyes: Conjunctivae are normal.  Pulmonary/Chest: Effort normal.   Neurological: She is alert and oriented to person, place, and time.  Psychiatric: Mood, memory, affect and judgment normal.   Patient reports morning stiffness for 1 hour.   Patient denies nocturnal pain.  Difficulty dressing/grooming: Denies Difficulty climbing stairs: Denies Difficulty getting out of chair: Denies Difficulty using hands for taps, buttons, cutlery, and/or writing: Denies  Assessment and Plan:  Rheumatoid arthritis involving multiple sites with positive rheumatoid factor (HCC) -  Positive RF, positive anti-CCP, erosive disease with contractures and ulnar deviation:  She has not had any recent rheumatoid arthritis flares. She has intermittent pain in both knee joints but no joint swelling.  She has no joint pain or joint swelling currently.  She has morning stiffness lasting 1 hour most mornings.   She is clinically doing well on Rinvoq 15 mg po daily, MTX 7 tablets po once weekly, and folic acid 2 mg po daily.  She feels this combination of medications has been effective.  She is no longer planning pregnancy at this time. She will continue on this current treatment regimen.  She does not need any refills at this time.  She was advised to notify us if she develops increased joint pain or joint swelling.  She will follow up in 3 months.   High risk medication use - Rinvoq 15 mg po daily, MTX 7 tablets po once weekly, folic acid 2 mg po daily (Enbrel, Humira, Orencia inadequte response). She is no longer planning pregnancy.  She is aware that she will need to be off of MTX 3 months prior to trying to conceiving.  She was advised to hold Rinvoq and MTX if she develops and infection and to resume once the  infection has cleared.  We discussed the importance of social distancing and following the standard precautions recommended by the CDC.    Primary osteoarthritis of both knees:She has intermittent pain in both knee joints.  She had an episode of joint pain in both knee joints last week.   She has no joint swelling.  The joint pain has resolved.  She uses voltaren gel topically PRN for pain relief.   Trigger finger, right ring finger: Resolved.   Follow Up Instructions: She will follow up in 3 months.    I discussed the assessment and treatment plan with the patient. The patient was provided an opportunity to ask questions and all were answered. The patient agreed with the plan and demonstrated an understanding of the instructions.   The patient was advised to call back or seek an in-person evaluation if the symptoms worsen or if the condition fails to improve as anticipated.  I provided 25 minutes of non-face-to-face time during this encounter. Pollyann SavoyShaili Deveshwar, MD   Scribed by-  Gearldine Bienenstockaylor M Dale, PA-C

## 2018-10-24 ENCOUNTER — Encounter: Payer: Self-pay | Admitting: Rheumatology

## 2018-10-26 ENCOUNTER — Encounter: Payer: Self-pay | Admitting: *Deleted

## 2018-10-26 DIAGNOSIS — N644 Mastodynia: Secondary | ICD-10-CM | POA: Diagnosis not present

## 2018-10-26 DIAGNOSIS — Z79899 Other long term (current) drug therapy: Secondary | ICD-10-CM | POA: Diagnosis not present

## 2018-10-26 DIAGNOSIS — Z6826 Body mass index (BMI) 26.0-26.9, adult: Secondary | ICD-10-CM | POA: Diagnosis not present

## 2018-10-27 ENCOUNTER — Telehealth: Payer: Self-pay | Admitting: *Deleted

## 2018-10-27 NOTE — Telephone Encounter (Signed)
Spoke with [employer] and advised patient does not have any to support a short term disability claim as patient is able to work but job will not allow. Patient is aware.

## 2018-10-27 NOTE — Telephone Encounter (Signed)
Spoke with patient and advised her see with be on her medications for a lifetime. Patient advised that as we have given her recommendations to ear a face mask, stay in an isolated area while working and sanitize her hands frequently when at work and her job unable to accommodate we are unable to provide a timeframe.

## 2018-10-30 ENCOUNTER — Encounter: Payer: Self-pay | Admitting: Rheumatology

## 2018-10-30 ENCOUNTER — Telehealth (INDEPENDENT_AMBULATORY_CARE_PROVIDER_SITE_OTHER): Payer: 59 | Admitting: Rheumatology

## 2018-10-30 DIAGNOSIS — Z8639 Personal history of other endocrine, nutritional and metabolic disease: Secondary | ICD-10-CM

## 2018-10-30 DIAGNOSIS — M0579 Rheumatoid arthritis with rheumatoid factor of multiple sites without organ or systems involvement: Secondary | ICD-10-CM | POA: Diagnosis not present

## 2018-10-30 DIAGNOSIS — M65341 Trigger finger, right ring finger: Secondary | ICD-10-CM

## 2018-10-30 DIAGNOSIS — M170 Bilateral primary osteoarthritis of knee: Secondary | ICD-10-CM

## 2018-10-30 DIAGNOSIS — Z79899 Other long term (current) drug therapy: Secondary | ICD-10-CM

## 2018-10-30 DIAGNOSIS — Z8742 Personal history of other diseases of the female genital tract: Secondary | ICD-10-CM

## 2018-10-30 DIAGNOSIS — Z8659 Personal history of other mental and behavioral disorders: Secondary | ICD-10-CM

## 2018-10-31 ENCOUNTER — Telehealth: Payer: Self-pay | Admitting: Rheumatology

## 2018-10-31 NOTE — Telephone Encounter (Addendum)
Patient calling to see if note can state patient to return to work with face mask, and isolation as much as possible. Not complete isolation. Please call patient to discuss.  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 Patient wants to know if that could be added as a job she could do.

## 2018-11-01 NOTE — Telephone Encounter (Signed)
Per patient she has spoken with her Human Resources and she said that she can't accept isolation in any form little or total percent so at this point.  All I need is a letter stating isolation until June 1st and patient would like to know if you are ok with reviewing the situations from month to month and giving letters as needed/ [place] states "that will be it for me that's all they want from me: Please advise.

## 2018-11-02 NOTE — Telephone Encounter (Signed)
If she can work from home then it is ok to give the letter.

## 2018-11-03 ENCOUNTER — Encounter: Payer: Self-pay | Admitting: *Deleted

## 2018-11-03 NOTE — Telephone Encounter (Signed)
Letter has been sent to patient via mychart.

## 2018-11-20 ENCOUNTER — Other Ambulatory Visit: Payer: Self-pay | Admitting: Rheumatology

## 2018-11-20 DIAGNOSIS — M545 Low back pain: Secondary | ICD-10-CM | POA: Diagnosis not present

## 2018-11-21 NOTE — Telephone Encounter (Signed)
Last Visit: 10/30/18 Next Visit: 01/31/19 Labs: 10/20/18 stable  Okay to refill per Dr. Corliss Skains

## 2018-12-27 ENCOUNTER — Encounter: Payer: Self-pay | Admitting: Rheumatology

## 2018-12-27 NOTE — Telephone Encounter (Signed)
Attempted to contact the patient and left message for patient to call the office.  

## 2018-12-28 ENCOUNTER — Ambulatory Visit (INDEPENDENT_AMBULATORY_CARE_PROVIDER_SITE_OTHER): Payer: 59 | Admitting: Rheumatology

## 2018-12-28 ENCOUNTER — Encounter: Payer: Self-pay | Admitting: Rheumatology

## 2018-12-28 ENCOUNTER — Other Ambulatory Visit: Payer: Self-pay

## 2018-12-28 VITALS — BP 131/79 | HR 75 | Resp 12 | Ht 62.0 in | Wt 161.0 lb

## 2018-12-28 DIAGNOSIS — Z79899 Other long term (current) drug therapy: Secondary | ICD-10-CM

## 2018-12-28 DIAGNOSIS — M0579 Rheumatoid arthritis with rheumatoid factor of multiple sites without organ or systems involvement: Secondary | ICD-10-CM | POA: Diagnosis not present

## 2018-12-28 DIAGNOSIS — M170 Bilateral primary osteoarthritis of knee: Secondary | ICD-10-CM | POA: Diagnosis not present

## 2018-12-28 MED ORDER — METHOTREXATE SODIUM CHEMO INJECTION 50 MG/2ML: INTRAMUSCULAR | 0 refills | Status: DC

## 2018-12-28 MED ORDER — PREDNISONE 5 MG PO TABS: ORAL_TABLET | ORAL | 0 refills | Status: AC

## 2018-12-28 NOTE — Patient Instructions (Signed)
Standing Labs We placed an order today for your standing lab work.    Please come back and get your standing labs in 2 weeks, 4 weeks, 8 weeks then every 3 months.  We have open lab daily Monday through Thursday from 8:30-12:30 PM and 1:30-4:30 PM and Friday from 8:30-12:30 PM and 1:30 -4:00 PM at the office of Dr. Bo Merino.   You may experience shorter wait times on Monday and Friday afternoons. The office is located at [address], [place], [place], [place] [address] No appointment is necessary.   Labs are drawn by [facility].  You may receive a bill from [facility] for your lab work.  If you wish to have your labs drawn at another location, please call the office 24 hours in advance to send orders.  If you have any questions regarding directions or hours of operation,  please call [phone].   Just as a reminder please drink plenty of water prior to coming for your lab work. Thanks!

## 2018-12-28 NOTE — Progress Notes (Signed)
Office Visit Note  Patient: Shannon David             Date of Birth: 1991-06-01           MRN: [id number]             PCP: Janine Limbo, PA-C Referring: Janine Limbo, PA-C Visit Date: 12/28/2018 Occupation: _0 @  Subjective:  Pain and swelling in bilateral hands.      History of Present Illness: Shannon David is a 28 y.o. female with a past medical history of seropositive rheumatoid arthritis and osteoarthritis.  She is taking Enbrel 50 mg daily, methotrexate 7 tablets every 7 days, and folic acid 2 mg daily.  She resumed Rinvoq and methotrexate in January after a gap in therapy for family planning.   She has noticed marked improvement in her joint pain.  She no longer has discomfort in her knees.  Patient believes that she is doing much better on current combination.  She just experienced a sudden flare 2 weeks ago with increased pain and swelling in her bilateral hands..  She has pain, discomfort, numbness and mild swelling in bilateral hands.  She states it is worse in her right hand and keeps her up at night.  She is left-handed.  Activities of Daily Living:  Patient reports morning stiffness for several hours.   Patient Reports nocturnal pain.  Difficulty dressing/grooming: Reports Difficulty climbing stairs: Denies Difficulty getting out of chair: Denies Difficulty using hands for taps, buttons, cutlery, and/or writing: Reports  Review of Systems  Constitutional: Positive for fatigue.  HENT: Negative for mouth sores, mouth dryness and nose dryness.   Eyes: Negative for itching and dryness.  Respiratory: Negative for shortness of breath, wheezing and difficulty breathing.   Cardiovascular: Negative for chest pain, palpitations and hypertension.  Gastrointestinal: Negative for abdominal pain, blood in stool, constipation and diarrhea.  Endocrine: Negative for increased urination.  Genitourinary: Negative for painful urination and pelvic pain.  Musculoskeletal:  Positive for arthralgias, joint pain, joint swelling and morning stiffness.  Skin: Negative for rash, hair loss and redness.  Allergic/Immunologic: Negative for susceptible to infections.  Neurological: Positive for weakness. Negative for dizziness, light-headedness, headaches and memory loss.  Hematological: Positive for bruising/bleeding tendency.  Psychiatric/Behavioral: Negative for confusion. The patient is not nervous/anxious.     PMFS History:  Patient Active Problem List   Diagnosis Date Noted  . History of PCOS 02/21/2017  . High risk medication use 09/21/2016  . Rheumatoid arthritis involving multiple sites with positive rheumatoid factor ([place]) 09/21/2016  . History of hypothyroidism 09/21/2016  . History of depression 09/21/2016    Past Medical History:  Diagnosis Date  . Arthritis     Family History  Problem Relation Age of Onset  . Hypertension Mother   . Clotting disorder Mother   . Cancer Maternal Grandmother        lung   Past Surgical History:  Procedure Laterality Date  . BUNIONECTOMY Right    Social History   Social History Narrative  . Not on file    There is no immunization history on file for this patient.   Objective: Vital Signs: BP 131/79 (BP Location: Left Arm, Patient Position: Sitting, Cuff Size: Normal)   Pulse 75   Resp 12   Ht _1  (1.575 m)   Wt 161 lb (73 kg)   BMI 29.45 kg/m    Physical Exam Vitals signs and nursing note reviewed.  Constitutional:      Appearance: She  is well-developed.  HENT:     Head: Normocephalic and atraumatic.  Eyes:     Conjunctiva/sclera: Conjunctivae normal.  Neck:     Musculoskeletal: Normal range of motion.  Cardiovascular:     Rate and Rhythm: Normal rate and regular rhythm.     Heart sounds: Normal heart sounds.  Pulmonary:     Effort: Pulmonary effort is normal.     Breath sounds: Normal breath sounds.  Abdominal:     General: Bowel sounds are normal.     Palpations: Abdomen is soft.   Lymphadenopathy:     Cervical: No cervical adenopathy.  Skin:    General: Skin is warm and dry.     Capillary Refill: Capillary refill takes less than 2 seconds.  Neurological:     Mental Status: She is alert and oriented to person, place, and time.  Psychiatric:        Behavior: Behavior normal.      Musculoskeletal Exam: C-spine thoracic and lumbar spine with good range of motion.  Shoulder joints elbow joints with good range of motion.  She has limited range of motion of bilateral wrist joint with some synovitis in her right wrist.  She has synovitis over bilateral second and third MCP joints.  Knee joints ankles and MTPs been good range of motion.  CDAI Exam: CDAI Score: 11.4  Patient Global: 7 mm; Provider Global: 7 mm Swollen: 5 ; Tender: 5  Joint Exam      Right  Left  Wrist  Swollen Tender     MCP 2  Swollen Tender  Swollen Tender  MCP 3  Swollen Tender  Swollen Tender     Investigation: No additional findings.  Imaging: No results found.  Recent Labs: Lab Results  Component Value Date   WBC 5.1 10/20/2018   HGB 12.3 10/20/2018   PLT 282 10/20/2018   NA 135 10/20/2018   K 4.4 10/20/2018   CL 104 10/20/2018   CO2 19 (L) 10/20/2018   GLUCOSE 79 10/20/2018   BUN 16 10/20/2018   CREATININE 0.61 10/20/2018   BILITOT 0.4 10/20/2018   ALKPHOS 55 10/20/2018   AST 14 10/20/2018   ALT 13 10/20/2018   PROT 6.9 10/20/2018   ALBUMIN 4.4 10/20/2018   CALCIUM 9.2 10/20/2018   GFRAA 142 10/20/2018   QFTBGOLD Negative 08/13/2016   QFTBGOLDPLUS Negative 10/20/2018    Speciality Comments: Prior therapy: Orencia (inadequate response 9/18-9/19), Enbrel (inadequate response 3/15/5/15), Humira (inadequate response 11/17-3/18), and injectable methotrexate (non-compliance)  Procedures:  No procedures performed Allergies: Keflex [cephalexin]   Assessment / Plan:     Visit Diagnoses: Rheumatoid arthritis involving multiple sites with positive rheumatoid factor (HCC) -  Positive RF, positive anti-CCP, 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  Although she had sudden flare about 2 weeks ago with pain and swelling in bilateral hands.  She does not recall any precipitating factors.  We discussed the option of switching to subcu methotrexate.  She was in agreement.  She states she did not like taking a lot of injectable medications but now as she is taking oral Rinvoq she is willing to try methotrexate subcu.  We will start her on 0.7 mL subcu weekly and if tolerated we can increase it to 0.8 mL subcu weekly.  She had mild elevation in her LFTs in the past.  Plan: methotrexate 50 MG/2ML injection, side effects were reviewed.  Prednisone taper was given  for the flare starting at 20 mg p.o. daily and tapering by 5 mg every 4 days.  High risk medication use -  Rinvoq 15 mg 1 tablet daily, methotrexate 2.5 mg 7 tablets every 7 days, and folic acid 1 mg 2 tablets daily.  Last TB gold negative on 10/20/2018 and will monitor yearly.  Most recent CBC/CMP stable on 10/20/2018.  Due for CBC/CMP in July and will monitor every 3 months.  Standing orders placed. Recommend annual influenza, Pneumovax 23, and Prevnar 13 as indicated for immunosuppressant therapy. (Enbrel, Humira, Orencia inadequte response). - Plan: CBC with Differential/Platelet, CMP14+EGFR, in 2 weeks x 2 and then every 8 weeks.  If labs are stable then we can check every 3 months.  Primary osteoarthritis of both knees - Plan: She has chronic pain.   Other medical conditions are listed as follows:   History of hypothyroidism  History of depression  History of PCOS  Orders: Orders Placed This Encounter  Procedures  . CBC with Differential/Platelet  . CMP14+EGFR   Meds ordered this encounter  Medications  . methotrexate 50 MG/2ML injection    Sig: Inject 0.7 mL under the skin weekly for two weeks, then inject 0.8 mL under  the skin weekly if tolerated.    Dispense:  6 mL    Refill:  0    Please dispense 2 mL vials with preservative  . predniSONE (DELTASONE) 5 MG tablet    Sig: Take 4 tablets (20 mg total) by mouth daily for 4 days, THEN 3 tablets (15 mg total) daily for 4 days, THEN 2 tablets (10 mg total) daily for 4 days, THEN 1 tablet (5 mg total) daily for 4 days, THEN 0.5 tablets (2.5 mg total) daily for 4 days.    Dispense:  42 tablet    Refill:  0     Follow-Up Instructions: Return in about 3 months (around 03/30/2019) for Rheumatoid arthritis.   Bo Merino, MD  Note - This record has been created using Editor, commissioning.  Chart creation errors have been sought, but may not always  have been located. Such creation errors do not reflect on  the standard of medical care.

## 2019-01-02 ENCOUNTER — Other Ambulatory Visit: Payer: Self-pay | Admitting: *Deleted

## 2019-01-02 MED ORDER — FOLIC ACID 1 MG PO TABS: 2.0000 mg | ORAL_TABLET | Freq: Every day | ORAL | 3 refills | Status: DC

## 2019-01-02 NOTE — Telephone Encounter (Signed)
Refill request received via fax  Last Visit: 12/27/18 Next Visit: 03/30/19  Okay to refill per Dr. Estanislado Pandy

## 2019-01-17 ENCOUNTER — Other Ambulatory Visit: Payer: Self-pay | Admitting: *Deleted

## 2019-01-17 ENCOUNTER — Telehealth: Payer: Self-pay | Admitting: Rheumatology

## 2019-01-17 DIAGNOSIS — Z79899 Other long term (current) drug therapy: Secondary | ICD-10-CM

## 2019-01-17 NOTE — Telephone Encounter (Signed)
Lab Orders released.  

## 2019-01-17 NOTE — Telephone Encounter (Signed)
Please release lab orders to [facility] in [place]. Patient going today.

## 2019-01-30 ENCOUNTER — Other Ambulatory Visit: Payer: Self-pay | Admitting: *Deleted

## 2019-01-30 ENCOUNTER — Telehealth: Payer: Self-pay | Admitting: Rheumatology

## 2019-01-30 DIAGNOSIS — Z79899 Other long term (current) drug therapy: Secondary | ICD-10-CM

## 2019-01-30 NOTE — Telephone Encounter (Signed)
Lab orders released.  

## 2019-01-30 NOTE — Telephone Encounter (Signed)
Patient going to [facility] today for labs. Please release orders.

## 2019-01-31 ENCOUNTER — Ambulatory Visit: Payer: 59 | Admitting: Physician Assistant

## 2019-01-31 LAB — CBC WITH DIFFERENTIAL/PLATELET
Basophils Absolute: 0 10*3/uL (ref 0.0–0.2)
Basos: 1 %
EOS (ABSOLUTE): 0 10*3/uL (ref 0.0–0.4)
Eos: 1 %
Hematocrit: 35.5 % (ref 34.0–46.6)
Hemoglobin: 12.4 g/dL (ref 11.1–15.9)
Lymphocytes Absolute: 1.7 10*3/uL (ref 0.7–3.1)
Lymphs: 46 %
MCH: 33 pg (ref 26.6–33.0)
MCHC: 34.9 g/dL (ref 31.5–35.7)
MCV: 94 fL (ref 79–97)
Monocytes Absolute: 0.4 10*3/uL (ref 0.1–0.9)
Monocytes: 12 %
Neutrophils Absolute: 1.4 10*3/uL (ref 1.4–7.0)
Neutrophils: 40 %
Platelets: 228 10*3/uL (ref 150–450)
RBC: 3.76 x10E6/uL — ABNORMAL LOW (ref 3.77–5.28)
RDW: 12.7 % (ref 11.7–15.4)
WBC: 3.6 10*3/uL (ref 3.4–10.8)

## 2019-01-31 LAB — CMP14+EGFR
ALT: 13 IU/L (ref 0–32)
AST: 14 IU/L (ref 0–40)
Albumin/Globulin Ratio: 1.9 (ref 1.2–2.2)
Albumin: 4.2 g/dL (ref 3.9–5.0)
Alkaline Phosphatase: 50 IU/L (ref 39–117)
BUN/Creatinine Ratio: 20 (ref 9–23)
BUN: 13 mg/dL (ref 6–20)
Bilirubin Total: 0.5 mg/dL (ref 0.0–1.2)
CO2: 24 mmol/L (ref 20–29)
Calcium: 9.2 mg/dL (ref 8.7–10.2)
Chloride: 103 mmol/L (ref 96–106)
Creatinine, Ser: 0.66 mg/dL (ref 0.57–1.00)
GFR calc Af Amer: 137 mL/min/{1.73_m2} (ref >59)
GFR calc non Af Amer: 119 mL/min/{1.73_m2} (ref >59)
Globulin, Total: 2.2 g/dL (ref 1.5–4.5)
Glucose: 83 mg/dL (ref 65–99)
Potassium: 4.4 mmol/L (ref 3.5–5.2)
Sodium: 136 mmol/L (ref 134–144)
Total Protein: 6.4 g/dL (ref 6.0–8.5)

## 2019-01-31 NOTE — Progress Notes (Signed)
WNLs

## 2019-02-17 ENCOUNTER — Encounter: Payer: Self-pay | Admitting: Rheumatology

## 2019-02-17 DIAGNOSIS — M0579 Rheumatoid arthritis with rheumatoid factor of multiple sites without organ or systems involvement: Secondary | ICD-10-CM

## 2019-02-23 NOTE — Progress Notes (Deleted)
Office Visit Note  Patient: Shannon David             Date of Birth: [birth year]           MRN: [id number]             PCP: Janine Limbo, PA-C Referring: Janine Limbo, PA-C Visit Date: 02/27/2019 Occupation: @GUAROCC @  Subjective:  No chief complaint on file.  Rinvoq 15 mg 1 tablet daily, methotrexate 0.8 mL every 7 days, and folic acid 1 mg 2 tablets daily.  Last TB Gold negative on 10/20/2018 and will monitor yearly.  Last lipid panel within normal limits except for low HDL on 03/23/2017.  Due for lipid panel today and will monitor yearly.  Most recent CBC/CMP within normal limits on 01/30/2019.  Due for CBC/CMP in November and will monitor every 3 months.  Standing orders are in place.  Recommend annual flu, Pneumovax 23, and Prevnar 13 as indicated for immunosuppressive therapy.  History of Present Illness: Shannon David is a 27 y.o. female ***   Activities of Daily Living:  Patient reports morning stiffness for *** {minute/hour:19697}.   Patient {ACTIONS;DENIES/REPORTS:21021675::"Denies"} nocturnal pain.  Difficulty dressing/grooming: {ACTIONS;DENIES/REPORTS:21021675::"Denies"} Difficulty climbing stairs: {ACTIONS;DENIES/REPORTS:21021675::"Denies"} Difficulty getting out of chair: {ACTIONS;DENIES/REPORTS:21021675::"Denies"} Difficulty using hands for taps, buttons, cutlery, and/or writing: {ACTIONS;DENIES/REPORTS:21021675::"Denies"}  No Rheumatology ROS completed.   PMFS History:  Patient Active Problem List   Diagnosis Date Noted  . History of PCOS 02/21/2017  . High risk medication use 09/21/2016  . Rheumatoid arthritis involving multiple sites with positive rheumatoid factor ([place]) 09/21/2016  . History of hypothyroidism 09/21/2016  . History of depression 09/21/2016    Past Medical History:  Diagnosis Date  . Arthritis     Family History  Problem Relation Age of Onset  . Hypertension Mother   . Clotting disorder Mother   . Cancer Maternal Grandmother        lung    Past Surgical History:  Procedure Laterality Date  . BUNIONECTOMY Right    Social History   Social History Narrative  . Not on file    There is no immunization history on file for this patient.   Objective: Vital Signs: There were no vitals taken for this visit.   Physical Exam   Musculoskeletal Exam: ***  CDAI Exam: CDAI Score: - Patient Global: -; Provider Global: - Swollen: -; Tender: - Joint Exam   No joint exam has been documented for this visit   There is currently no information documented on the homunculus. Go to the Rheumatology activity and complete the homunculus joint exam.  Investigation: No additional findings.  Imaging: No results found.  Recent Labs: Lab Results  Component Value Date   WBC 3.6 01/30/2019   HGB 12.4 01/30/2019   PLT 228 01/30/2019   NA 136 01/30/2019   K 4.4 01/30/2019   CL 103 01/30/2019   CO2 24 01/30/2019   GLUCOSE 83 01/30/2019   BUN 13 01/30/2019   CREATININE 0.66 01/30/2019   BILITOT 0.5 01/30/2019   ALKPHOS 50 01/30/2019   AST 14 01/30/2019   ALT 13 01/30/2019   PROT 6.4 01/30/2019   ALBUMIN 4.2 01/30/2019   CALCIUM 9.2 01/30/2019   GFRAA 137 01/30/2019   QFTBGOLD Negative 08/13/2016   QFTBGOLDPLUS Negative 10/20/2018    Speciality Comments: Prior therapy: Orencia (inadequate response 9/18-9/19), Enbrel (inadequate response 3/15/5/15), Humira (inadequate response 11/17-3/18), and injectable methotrexate (non-compliance)  Procedures:  No procedures performed Allergies: Keflex [cephalexin]   Assessment / Plan:  Visit Diagnoses: No diagnosis found.  Orders: No orders of the defined types were placed in this encounter.  No orders of the defined types were placed in this encounter.   Face-to-face time spent with patient was *** minutes. Greater than 50% of time was spent in counseling and coordination of care.  Follow-Up Instructions: No follow-ups on file.   Gearldine Bienenstock, PA-C  Note - This  record has been created using Dragon software.  Chart creation errors have been sought, but may not always  have been located. Such creation errors do not reflect on  the standard of medical care.

## 2019-02-27 ENCOUNTER — Telehealth: Payer: Self-pay | Admitting: Pharmacy Technician

## 2019-02-27 ENCOUNTER — Ambulatory Visit: Payer: 59 | Admitting: Rheumatology

## 2019-02-27 MED ORDER — RASUVO 17.5 MG/0.35ML ~~LOC~~ SOAJ: 17.5000 mg | SUBCUTANEOUS | 0 refills | Status: DC

## 2019-02-27 NOTE — Telephone Encounter (Signed)
Received notification from [facility] regarding a prior authorization for [facility]. Authorization has been APPROVED from 02/27/2019 to 02/27/2020.   Will send document to scan center.  Authorization # [id number]

## 2019-02-27 NOTE — Telephone Encounter (Signed)
Submitted a Prior Authorization request to [facility] for [facility] via Cover My Meds. Will update once we receive a response.

## 2019-02-28 NOTE — Progress Notes (Deleted)
.   Office Visit Note  Patient: Shannon David             Date of Birth: 1991-02-13           MRN: [id number]             PCP: Janine Limbo, PA-C Referring: Janine Limbo, PA-C Visit Date: 03/06/2019 Occupation: @GUAROCC @  Subjective:  No chief complaint on file.   History of Present Illness: Shannon David is a 28 y.o. female ***   Activities of Daily Living:  Patient reports morning stiffness for *** {minute/hour:19697}.   Patient {ACTIONS;DENIES/REPORTS:21021675::"Denies"} nocturnal pain.  Difficulty dressing/grooming: {ACTIONS;DENIES/REPORTS:21021675::"Denies"} Difficulty climbing stairs: {ACTIONS;DENIES/REPORTS:21021675::"Denies"} Difficulty getting out of chair: {ACTIONS;DENIES/REPORTS:21021675::"Denies"} Difficulty using hands for taps, buttons, cutlery, and/or writing: {ACTIONS;DENIES/REPORTS:21021675::"Denies"}  No Rheumatology ROS completed.   PMFS History:  Patient Active Problem List   Diagnosis Date Noted  . History of PCOS 02/21/2017  . High risk medication use 09/21/2016  . Rheumatoid arthritis involving multiple sites with positive rheumatoid factor ([place]) 09/21/2016  . History of hypothyroidism 09/21/2016  . History of depression 09/21/2016    Past Medical History:  Diagnosis Date  . Arthritis     Family History  Problem Relation Age of Onset  . Hypertension Mother   . Clotting disorder Mother   . Cancer Maternal Grandmother        lung   Past Surgical History:  Procedure Laterality Date  . BUNIONECTOMY Right    Social History   Social History Narrative  . Not on file    There is no immunization history on file for this patient.   Objective: Vital Signs: There were no vitals taken for this visit.   Physical Exam   Musculoskeletal Exam: ***  CDAI Exam: CDAI Score: - Patient Global: -; Provider Global: - Swollen: -; Tender: - Joint Exam   No joint exam has been documented for this visit   There is currently no information  documented on the homunculus. Go to the Rheumatology activity and complete the homunculus joint exam.  Investigation: No additional findings.  Imaging: No results found.  Recent Labs: Lab Results  Component Value Date   WBC 3.6 01/30/2019   HGB 12.4 01/30/2019   PLT 228 01/30/2019   NA 136 01/30/2019   K 4.4 01/30/2019   CL 103 01/30/2019   CO2 24 01/30/2019   GLUCOSE 83 01/30/2019   BUN 13 01/30/2019   CREATININE 0.66 01/30/2019   BILITOT 0.5 01/30/2019   ALKPHOS 50 01/30/2019   AST 14 01/30/2019   ALT 13 01/30/2019   PROT 6.4 01/30/2019   ALBUMIN 4.2 01/30/2019   CALCIUM 9.2 01/30/2019   GFRAA 137 01/30/2019   QFTBGOLD Negative 08/13/2016   QFTBGOLDPLUS Negative 10/20/2018    Speciality Comments: Prior therapy: Orencia (inadequate response 9/18-9/19), Enbrel (inadequate response 3/15/5/15), Humira (inadequate response 11/17-3/18), and injectable methotrexate (non-compliance)  Procedures:  No procedures performed Allergies: Keflex [cephalexin]   Assessment / Plan:     Visit Diagnoses: Rheumatoid arthritis involving multiple sites with positive rheumatoid factor ([place])  High risk medication use  Primary osteoarthritis of both knees  Trigger finger, right ring finger  History of hypothyroidism  History of PCOS  History of depression  Orders: No orders of the defined types were placed in this encounter.  No orders of the defined types were placed in this encounter.   Face-to-face time spent with patient was *** minutes. Greater than 50% of time was spent in counseling and coordination of care.  Follow-Up  Instructions: No follow-ups on file.   Ofilia Neas, PA-C  Note - This record has been created using Dragon software.  Chart creation errors have been sought, but may not always  have been located. Such creation errors do not reflect on  the standard of medical care.

## 2019-03-06 ENCOUNTER — Ambulatory Visit: Payer: 59 | Admitting: Rheumatology

## 2019-03-16 NOTE — Progress Notes (Signed)
Office Visit Note  Patient: Shannon David             Date of Birth: 02/25/1991           MRN: [id number]             PCP: Eunice Blase, PA-C Referring: Eunice Blase, PA-C Visit Date: 03/30/2019 Occupation: @GUAROCC @  Subjective:  Medication monitoring    History of Present Illness: Chele Cornell is a 28 y.o. female with history of seropositive rheumatoid arthritis and osteoarthritis.  Patient is taking Rinvoq 15 mg 1 tablet by mouth daily and Rasuvo 17.5 mg every week.  She continues to experience nausea and fatigue 1 to 2 days after her weekly injections.  She takes Zofran as needed and continues to take folic acid 2 mg by mouth daily.  She denies any recent flares.  She denies any joint pain or joint swelling.  She denies any morning stiffness.  She denies any nocturnal pain.  She states that she has been clinically doing very well on this regimen.  She does not need any refills at this time.   Activities of Daily Living:  Patient reports morning stiffness for 0 minutes.   Patient Denies nocturnal pain.  Difficulty dressing/grooming: Denies Difficulty climbing stairs: Denies Difficulty getting out of chair: Denies Difficulty using hands for taps, buttons, cutlery, and/or writing: Denies  Review of Systems  Constitutional: Positive for fatigue.  HENT: Negative for mouth sores, mouth dryness and nose dryness.   Eyes: Negative for pain, visual disturbance and dryness.  Respiratory: Negative for cough, hemoptysis, shortness of breath and difficulty breathing.   Cardiovascular: Negative for chest pain, palpitations, hypertension and swelling in legs/feet.  Gastrointestinal: Positive for nausea (SE of MTX). Negative for blood in stool, constipation and diarrhea.  Endocrine: Negative for increased urination.  Genitourinary: Negative for painful urination.  Musculoskeletal: Positive for arthralgias and joint pain. Negative for joint swelling, myalgias, muscle weakness, morning  stiffness, muscle tenderness and myalgias.  Skin: Negative for color change, pallor, rash, hair loss, nodules/bumps, skin tightness, ulcers and sensitivity to sunlight.  Allergic/Immunologic: Negative for susceptible to infections.  Neurological: Negative for dizziness, numbness, headaches and weakness.  Hematological: Negative for swollen glands.  Psychiatric/Behavioral: Negative for depressed mood and sleep disturbance. The patient is not nervous/anxious.     PMFS History:  Patient Active Problem List   Diagnosis Date Noted  . History of PCOS 02/21/2017  . High risk medication use 09/21/2016  . Rheumatoid arthritis involving multiple sites with positive rheumatoid factor (HCC) 09/21/2016  . History of hypothyroidism 09/21/2016  . History of depression 09/21/2016    Past Medical History:  Diagnosis Date  . Arthritis     Family History  Problem Relation Age of Onset  . Hypertension Mother   . Clotting disorder Mother   . Cancer Maternal Grandmother        lung   Past Surgical History:  Procedure Laterality Date  . BUNIONECTOMY Right    Social History   Social History Narrative  . Not on file    There is no immunization history on file for this patient.   Objective: Vital Signs: BP 111/76 (BP Location: Right Arm, Patient Position: Sitting, Cuff Size: Normal)   Pulse 70   Resp 12   Ht 5\' 2"  (1.575 m)   Wt 161 lb (73 kg)   BMI 29.45 kg/m    Physical Exam Vitals signs and nursing note reviewed.  Constitutional:      Appearance: She  is well-developed.  HENT:     Head: Normocephalic and atraumatic.  Eyes:     Conjunctiva/sclera: Conjunctivae normal.  Neck:     Musculoskeletal: Normal range of motion.  Cardiovascular:     Rate and Rhythm: Normal rate and regular rhythm.     Heart sounds: Normal heart sounds.  Pulmonary:     Effort: Pulmonary effort is normal.     Breath sounds: Normal breath sounds.  Abdominal:     General: Bowel sounds are normal.      Palpations: Abdomen is soft.  Lymphadenopathy:     Cervical: No cervical adenopathy.  Skin:    General: Skin is warm and dry.     Capillary Refill: Capillary refill takes less than 2 seconds.  Neurological:     Mental Status: She is alert and oriented to person, place, and time.  Psychiatric:        Behavior: Behavior normal.      Musculoskeletal Exam: C-spine, thoracic spine, most angular range of motion.  No midline spinal tenderness.  No SI joint tenderness.  Shoulder joints, elbows, MCPs and PIPs and DIPs good range of motion no synovitis.  Limited ROM of both wrist joints.  Ulnar deviation bilaterally.  She has complete fist formation bilaterally.  Hip joints, knee joints, ankle joints, MTPs, PIPs, DIPs good range of motion no synovitis.  No warmth or effusion of bilateral knee joints.  No tenderness or swelling of ankle joints.  No tenderness of MTP joints.  CDAI Exam: CDAI Score: 0.8  Patient Global: 4 mm; Provider Global: 4 mm Swollen: 0 ; Tender: 0  Joint Exam   No joint exam has been documented for this visit   There is currently no information documented on the homunculus. Go to the Rheumatology activity and complete the homunculus joint exam.  Investigation: No additional findings.  Imaging: No results found.  Recent Labs: Lab Results  Component Value Date   WBC 3.6 01/30/2019   HGB 12.4 01/30/2019   PLT 228 01/30/2019   NA 136 01/30/2019   K 4.4 01/30/2019   CL 103 01/30/2019   CO2 24 01/30/2019   GLUCOSE 83 01/30/2019   BUN 13 01/30/2019   CREATININE 0.66 01/30/2019   BILITOT 0.5 01/30/2019   ALKPHOS 50 01/30/2019   AST 14 01/30/2019   ALT 13 01/30/2019   PROT 6.4 01/30/2019   ALBUMIN 4.2 01/30/2019   CALCIUM 9.2 01/30/2019   GFRAA 137 01/30/2019   QFTBGOLD Negative 08/13/2016   QFTBGOLDPLUS Negative 10/20/2018    Speciality Comments: Prior therapy: Orencia (inadequate response 9/18-9/19), Enbrel (inadequate response 3/15/5/15), Humira (inadequate  response 11/17-3/18), and injectable methotrexate (non-compliance)  Procedures:  No procedures performed Allergies: Keflex [cephalexin]    Assessment / Plan:     Visit Diagnoses: Rheumatoid arthritis involving multiple sites with positive rheumatoid factor (HCC) - Positive RF, positive anti-CCP, erosive disease with contractures and ulnar deviation: She has no synovitis on exam.  She has not had a flare in over 3 months according to the patient.  She denies any joint pain or joint swelling at this time.  She has no tenderness on examination today.  She is not experiencing any morning stiffness or nocturnal pain.  She is clinically doing well on Rinvoq 15 mg 1 tablet by mouth daily, Rasuvo 17.5 mg sq once weekly, and folic acid 2 mg daily.  She has not missed any doses of these medications recently.  She continues to have fatigue and nausea for 1 to 2  days after the Rasuvo injections.  She states that the nausea was related to the whole process of self injections as well as the smell of alcohol swabs.  She takes Zofran as needed for nausea.  If she is symptom-free for 1 year we may reduce the dose of Rasuvo.  She states that overall the nausea was better on the subcu injections compared to the tablets of methotrexate.  X-rays of both hands and feet were obtained today to assess for interval change.  She will continue taking Rinvoq 15 mg 1 tablet by mouth daily, Rasuvo 17.5 mg subcu once weekly and folic acid 2 mg by mouth daily.  She does not need any refills at this time.  She was advised to notify us if she develops increased joint pain or joint swelling.  She will follow-up in the office in 5 months.- Plan: XR Hand 2 View Left, XR Hand 2 View Right, XR Foot 2 Views Left, XR Foot 2 Views Right  High risk medication use - Rinvoq 15 mg 1 tablet daily, Rasuvo 17.5 mg every 7 days, and folic 1 mg 2 tablets daily.  Last TB gold negative on 10/20/2018 and will monitor yearly. Last lipid panel showed decreased  HDL on 03/23/2017.  Future order for lipid panel was placed today.  Most recent CBC/CMP within normal limits on 01/30/2019. She will return for lab work in November and every 3 months.- Plan: Lipid panel  Primary osteoarthritis of both knees: She has good range of motion of bilateral knee joints with no discomfort.  No warmth or effusion was noted.  She has no difficulty climbing steps or getting up from a chair  Other medical conditions are listed as follows  History of depression  History of hypothyroidism  History of PCOS  Orders: Orders Placed This Encounter  Procedures  . XR Hand 2 View Left  . XR Hand 2 View Right  . XR Foot 2 Views Left  . XR Foot 2 Views Right  . Lipid panel   No orders of the defined types were placed in this encounter.   Face-to-face time spent with patient was 30 minutes. Greater than 50% of time was spent in counseling and coordination of care.  Follow-Up Instructions: Return in about 5 months (around 08/28/2019) for Rheumatoid arthritis, Osteoarthritis.   Ofilia Neas, PA-C   I examined and evaluated the patient with Hazel Sams PA.  Patient has some severe erosive rheumatoid arthritis.  X-rays obtained today did not show any radiographic progression.  She is clinically doing very well on the combination therapy.  Although she does not like the nausea with the use of methotrexate.  The plan of care was discussed as noted above.  Bo Merino, MD  Note - This record has been created using Editor, commissioning.  Chart creation errors have been sought, but may not always  have been located. Such creation errors do not reflect on  the standard of medical care.

## 2019-03-24 ENCOUNTER — Other Ambulatory Visit: Payer: Self-pay | Admitting: Rheumatology

## 2019-03-24 DIAGNOSIS — M0579 Rheumatoid arthritis with rheumatoid factor of multiple sites without organ or systems involvement: Secondary | ICD-10-CM

## 2019-03-30 ENCOUNTER — Ambulatory Visit: Payer: Self-pay

## 2019-03-30 ENCOUNTER — Ambulatory Visit (INDEPENDENT_AMBULATORY_CARE_PROVIDER_SITE_OTHER): Payer: 59 | Admitting: Rheumatology

## 2019-03-30 ENCOUNTER — Encounter: Payer: Self-pay | Admitting: Rheumatology

## 2019-03-30 ENCOUNTER — Other Ambulatory Visit: Payer: Self-pay

## 2019-03-30 VITALS — BP 111/76 | HR 70 | Resp 12 | Ht 62.0 in | Wt 161.0 lb

## 2019-03-30 DIAGNOSIS — Z79899 Other long term (current) drug therapy: Secondary | ICD-10-CM | POA: Diagnosis not present

## 2019-03-30 DIAGNOSIS — Z8659 Personal history of other mental and behavioral disorders: Secondary | ICD-10-CM | POA: Diagnosis not present

## 2019-03-30 DIAGNOSIS — M0579 Rheumatoid arthritis with rheumatoid factor of multiple sites without organ or systems involvement: Secondary | ICD-10-CM | POA: Diagnosis not present

## 2019-03-30 DIAGNOSIS — M170 Bilateral primary osteoarthritis of knee: Secondary | ICD-10-CM | POA: Diagnosis not present

## 2019-03-30 DIAGNOSIS — Z8742 Personal history of other diseases of the female genital tract: Secondary | ICD-10-CM

## 2019-03-30 DIAGNOSIS — Z8639 Personal history of other endocrine, nutritional and metabolic disease: Secondary | ICD-10-CM

## 2019-03-30 NOTE — Patient Instructions (Signed)
Standing Labs We placed an order today for your standing lab work.    Please come back and get your standing labs in November and every 3 months  We have open lab daily Monday through Thursday from 8:30-12:30 PM and 1:30-4:30 PM and Friday from 8:30-12:30 PM and 1:30-4:00 PM at the office of Dr. Shaili Deveshwar.   You may experience shorter wait times on Monday and Friday afternoons. The office is located at [address], [address], [place], [place] [address] No appointment is necessary.   Labs are drawn by [facility].  You may receive a bill from [facility] for your lab work.  If you wish to have your labs drawn at another location, please call the office 24 hours in advance to send orders.  If you have any questions regarding directions or hours of operation,  please call [phone].   Just as a reminder please drink plenty of water prior to coming for your lab work. Thanks!  

## 2019-04-06 ENCOUNTER — Other Ambulatory Visit: Payer: Self-pay | Admitting: Rheumatology

## 2019-04-06 DIAGNOSIS — M0579 Rheumatoid arthritis with rheumatoid factor of multiple sites without organ or systems involvement: Secondary | ICD-10-CM

## 2019-04-09 NOTE — Telephone Encounter (Signed)
Last Visit: 03/30/19 Next Visit: 08/28/19 Labs: 01/30/19 WNL  Okay to refill per Dr. Estanislado Pandy

## 2019-04-24 ENCOUNTER — Encounter: Payer: Self-pay | Admitting: Rheumatology

## 2019-04-24 NOTE — Telephone Encounter (Signed)
Please schedule an appointment.

## 2019-04-25 NOTE — Progress Notes (Signed)
Office Visit Note  Patient: Shannon David             Date of Birth: 1991-02-15           MRN: [id number]             PCP: Janine Limbo, PA-C Referring: Janine Limbo, PA-C Visit Date: 04/26/2019 Occupation: @GUAROCC @  Subjective:  Neck pain   History of Present Illness: [name] is a 28 y.o. female with history of seropositive rheumatoid arthritis and osteoarthritis.  She is taking Rinvoq 15 mg 1 tablet by mouth daily, Rasuvo 17.5 mg sq once weekly, and folic acid 2 mg po daily.  She has not had any recent rheumatoid arthritis flares.   She presents today with increased neck pain, which initially started 1 month ago.  She states she woke up and was getting dressed and heard a pop in her neck.  She states the first episode of discomfort lasted about 3 days.  She has had 3 episodes since then.  She states the pain is mainly right sided.  She is having severe pain with flexion of the C-spine.  She denies any radiating pain or numbness.   She denies any other joint pain or joint swelling.    Activities of Daily Living:  Patient reports morning stiffness for 0  minutes.   Patient Reports nocturnal pain.  Difficulty dressing/grooming: Denies Difficulty climbing stairs: Denies Difficulty getting out of chair: Denies Difficulty using hands for taps, buttons, cutlery, and/or writing: Denies  Review of Systems  Constitutional: Negative for fatigue.  HENT: Negative for mouth sores, mouth dryness and nose dryness.   Eyes: Negative for pain, visual disturbance and dryness.  Respiratory: Negative for cough, hemoptysis, shortness of breath and difficulty breathing.   Cardiovascular: Negative for chest pain, palpitations, hypertension and swelling in legs/feet.  Gastrointestinal: Negative for blood in stool, constipation and diarrhea.  Endocrine: Negative for increased urination.  Genitourinary: Negative for painful urination.  Musculoskeletal: Positive for arthralgias and joint pain.  Negative for joint swelling, myalgias, muscle weakness, morning stiffness, muscle tenderness and myalgias.  Skin: Negative for color change, pallor, rash, hair loss, nodules/bumps, skin tightness, ulcers and sensitivity to sunlight.  Allergic/Immunologic: Negative for susceptible to infections.  Neurological: Negative for dizziness, numbness, headaches and weakness.  Hematological: Negative for swollen glands.  Psychiatric/Behavioral: Negative for depressed mood and sleep disturbance. The patient is not nervous/anxious.     PMFS History:  Patient Active Problem List   Diagnosis Date Noted   History of PCOS 02/21/2017   High risk medication use 09/21/2016   Rheumatoid arthritis involving multiple sites with positive rheumatoid factor ([facility]) 09/21/2016   History of hypothyroidism 09/21/2016   History of depression 09/21/2016    Past Medical History:  Diagnosis Date   Arthritis     Family History  Problem Relation Age of Onset   Hypertension Mother    Clotting disorder Mother    Cancer Maternal Grandmother        lung   Past Surgical History:  Procedure Laterality Date   BUNIONECTOMY Right    Social History   Social History Narrative   Not on file    There is no immunization history on file for this patient.   Objective: Vital Signs: BP 113/75 (BP Location: Left Arm, Patient Position: Sitting, Cuff Size: Normal)    Pulse 68    Resp 14    Ht 5\' 2"  (1.575 m)    Wt 162 lb (73.5 kg)  LMP 04/05/2019    BMI 29.63 kg/m    Physical Exam Vitals signs and nursing note reviewed.  Constitutional:      Appearance: She is well-developed.  HENT:     Head: Normocephalic and atraumatic.  Eyes:     Conjunctiva/sclera: Conjunctivae normal.  Neck:     Musculoskeletal: Normal range of motion.  Cardiovascular:     Rate and Rhythm: Normal rate and regular rhythm.     Heart sounds: Normal heart sounds.  Pulmonary:     Effort: Pulmonary effort is normal.     Breath sounds:  Normal breath sounds.  Abdominal:     General: Bowel sounds are normal.     Palpations: Abdomen is soft.  Lymphadenopathy:     Cervical: No cervical adenopathy.  Skin:    General: Skin is warm and dry.     Capillary Refill: Capillary refill takes less than 2 seconds.  Neurological:     Mental Status: She is alert and oriented to person, place, and time.  Psychiatric:        Behavior: Behavior normal.      Musculoskeletal Exam: C-spine limited ROM with flexion and lateral rotation with discomfort.  Thoracic and lumbar spine good ROM with no discomfort.  Shoulder joints, elbow joints, MCPs, PIPs, and DIPs good ROM with no synovitis. Limited ROM of both wrist joints. Complete fist formation bilaterally. Synovial thickening and ulnar deviation of MCP joints.  Hip joints, knee joints, ankle joints, MTPs, PIPs, and DIPs good ROM with no synovitis.  No warmth or effusion of knee joints.  No tenderness or swelling of ankle joints.   CDAI Exam: CDAI Score: 0.6  Patient Global: 3 mm; Provider Global: 3 mm Swollen: 0 ; Tender: 0  Joint Exam   No joint exam has been documented for this visit   There is currently no information documented on the homunculus. Go to the Rheumatology activity and complete the homunculus joint exam.  Investigation: No additional findings.  Imaging: Xr Cervical Spine 2 Or 3 Views  Result Date: 04/26/2019 No disc space narrowing was noted.  No facet joint arthropathy was noted. Impression: Unremarkable x-ray of the cervical spine.  Xr Foot 2 Views Left  Result Date: 03/30/2019 Subluxation of most MTPs was noted.  Erosive changes in second third fourth and fifth MTP joints was noted.  PIP and DIP joint narrowing was noted.  No intertarsal or tibiotalar joint space narrowing was noted. Impression: These findings are consistent with severe erosive rheumatoid arthritis.  Xr Foot 2 Views Right  Result Date: 03/30/2019 Narrowing of first MTP PIP and DIP was noted.   Subluxation of all MTP joints was noted.  Postsurgical change in the first metatarsal was noted.  Erosive changes were noted in all of her MTP joints.  No intertarsal or subtalar joint space narrowing was noted. Impression: These findings are consistent with severe erosive rheumatoid arthritis.  Xr Hand 2 View Left  Result Date: 03/30/2019 Juxta-articular osteopenia was noted.  Severe narrowing of all MCP joints with invagination of second and third MCP joint was noted.  Subluxation of first MCP joint with erosion was noted.  PIP and DIP narrowing was noted.  Erosive changes were noted in the carpal bones with narrowing of metacarpocarpal intercarpal and radiocarpal joints. Impression: These findings are consistent with severe erosive rheumatoid arthritis.  Xr Hand 2 View Right  Result Date: 03/30/2019 Juxta-articular osteopenia was noted.  PIP and DIP narrowing was noted.  Subluxation of all MCP joints  was noted.  Invagination of second and third MCP joints was noted.  Erosive changes were noted in the first second third and fourth MCP joints.  Severe narrowing of intercarpal, radiocarpal, metacarpocarpal joints was noted.  Cystic versus erosive changes were noted in the carpal bones. Impression: These findings are consistent with severe erosive rheumatoid arthritis of the hand.   Recent Labs: Lab Results  Component Value Date   WBC 3.6 01/30/2019   HGB 12.4 01/30/2019   PLT 228 01/30/2019   NA 136 01/30/2019   K 4.4 01/30/2019   CL 103 01/30/2019   CO2 24 01/30/2019   GLUCOSE 83 01/30/2019   BUN 13 01/30/2019   CREATININE 0.66 01/30/2019   BILITOT 0.5 01/30/2019   ALKPHOS 50 01/30/2019   AST 14 01/30/2019   ALT 13 01/30/2019   PROT 6.4 01/30/2019   ALBUMIN 4.2 01/30/2019   CALCIUM 9.2 01/30/2019   GFRAA 137 01/30/2019   QFTBGOLD Negative 08/13/2016   QFTBGOLDPLUS Negative 10/20/2018    Speciality Comments: Prior therapy: Orencia (inadequate response 9/18-9/19), Enbrel  (inadequate response 3/15/5/15), Humira (inadequate response 11/17-3/18), and injectable methotrexate (non-compliance)  Procedures:  No procedures performed Allergies: Keflex [cephalexin]   Assessment / Plan:     Visit Diagnoses: Rheumatoid arthritis involving multiple sites with positive rheumatoid factor (HCC) - Positive RF, positive anti-CCP, erosive disease with contractures and ulnar deviation: She has no synovitis on exam.  She has not had any recent rheumatoid arthritis flares.  She is clinically doing well on bending both 15 mg 1 tablet by mouth daily, Rasuvo 17.5 mg subcu injections once weekly, folic acid 2 mg by mouth daily.  She has not missed any doses of these medications recently.  She presents today with increased neck pain that started about 1 month ago.  The neck pain is episodic and has happened about 4 times lasting up 3 to 4 days each episode.  X-rays of the C-spine were obtained today.  She has no other joint pain or joint swelling at this time.  She has no morning stiffness.  She has not had any difficulties with ADLs.  She will continue taking Rinvoq and Rasuvo as prescribed.  She does not need any refills at this time.  We will obtain routine lab work today.  She was advised to notify us if she develops increased joint pain or joint swelling.  She will follow-up in the office in March 2020.  High risk medication use - Rinvoq 15 mg 1 tablet daily, Rasuvo 17.5 mg every 7 days, and folic 1 mg 2 tablets daily. CBC and CMP will be drawn today to monitor for drug toxicity. TB gold negative on 10/20/18.- Plan: CBC with Differential/Platelet, COMPLETE METABOLIC PANEL WITH GFR  Primary osteoarthritis of both knees:  She has good ROM of both knee joints.  No warmth or effusion of knee joints noted.    Neck pain -She presents today with increased neck pain which started 1 month ago.  She states that she woke up in the morning and was getting dressed and heard a pop in her neck and had  discomfort for 3 to 4 days.  She has had 3 other episodes of neck pain lasting the same duration over the past 1 month.  Her neck pain is right-sided.  She has pain with flexion and lateral rotation.  She is not experiencing any numbness or weakness in the right upper extremity.  She lifts heavy objects at work on a daily basis which may  be contributing to her discomfort.  X-rays of the C-spine were obtained today.  X-rays were unremarkable.  We will send in a prednisone taper starting at 20 mg tapering by 5 mg every 2 days. She was advised to notify us if her neck pain persists or worsens. She was given a handout of neck exercises to perform.  Plan: XR Cervical Spine 2 or 3 views  Other medical conditions are listed as follows:   History of hypothyroidism  History of PCOS  History of depression   Orders: Orders Placed This Encounter  Procedures   XR Cervical Spine 2 or 3 views   CBC with Differential/Platelet   COMPLETE METABOLIC PANEL WITH GFR   Meds ordered this encounter  Medications   predniSONE (DELTASONE) 5 MG tablet    Sig: Take 4 tablets by mouth daily x2 days, 3 tablets by mouth daily x2 days, 2 tablets by mouth daily x2 days, 1 tablet by mouth daily x2 days.    Dispense:  20 tablet    Refill:  0    Face-to-face time spent with patient was 30 minutes. Greater than 50% of time was spent in counseling and coordination of care.  Follow-Up Instructions: Return for Rheumatoid arthritis, Osteoarthritis.   Gearldine Bienenstockaylor M Aashvi Rezabek, PA-C   I examined and evaluated the patient with Sherron Alesaylor Nimo Verastegui PA. The plan of care was discussed as noted above.  Pollyann SavoyShaili Deveshwar, MD  Note - This record has been created using Animal nutritionistDragon software.  Chart creation errors have been sought, but may not always  have been located. Such creation errors do not reflect on  the standard of medical care.

## 2019-04-26 ENCOUNTER — Other Ambulatory Visit: Payer: Self-pay

## 2019-04-26 ENCOUNTER — Ambulatory Visit (INDEPENDENT_AMBULATORY_CARE_PROVIDER_SITE_OTHER): Payer: 59 | Admitting: Physician Assistant

## 2019-04-26 ENCOUNTER — Ambulatory Visit (INDEPENDENT_AMBULATORY_CARE_PROVIDER_SITE_OTHER): Payer: 59

## 2019-04-26 ENCOUNTER — Encounter: Payer: Self-pay | Admitting: Physician Assistant

## 2019-04-26 VITALS — BP 113/75 | HR 68 | Resp 14 | Ht 62.0 in | Wt 162.0 lb

## 2019-04-26 DIAGNOSIS — M542 Cervicalgia: Secondary | ICD-10-CM

## 2019-04-26 DIAGNOSIS — Z8639 Personal history of other endocrine, nutritional and metabolic disease: Secondary | ICD-10-CM

## 2019-04-26 DIAGNOSIS — M0579 Rheumatoid arthritis with rheumatoid factor of multiple sites without organ or systems involvement: Secondary | ICD-10-CM

## 2019-04-26 DIAGNOSIS — Z8742 Personal history of other diseases of the female genital tract: Secondary | ICD-10-CM

## 2019-04-26 DIAGNOSIS — Z79899 Other long term (current) drug therapy: Secondary | ICD-10-CM | POA: Diagnosis not present

## 2019-04-26 DIAGNOSIS — M170 Bilateral primary osteoarthritis of knee: Secondary | ICD-10-CM

## 2019-04-26 DIAGNOSIS — Z8659 Personal history of other mental and behavioral disorders: Secondary | ICD-10-CM

## 2019-04-26 MED ORDER — PREDNISONE 5 MG PO TABS: ORAL_TABLET | ORAL | 0 refills | Status: DC

## 2019-04-26 NOTE — Patient Instructions (Signed)

## 2019-04-27 LAB — CBC WITH DIFFERENTIAL/PLATELET
Absolute Monocytes: 456 cells/uL (ref 200–950)
Basophils Absolute: 30 cells/uL (ref 0–200)
Basophils Relative: 0.7 %
Eosinophils Absolute: 0 cells/uL — ABNORMAL LOW (ref 15–500)
Eosinophils Relative: 0 %
HCT: 38.3 % (ref 35.0–45.0)
Hemoglobin: 13 g/dL (ref 11.7–15.5)
Lymphs Abs: 907 cells/uL (ref 850–3900)
MCH: 33 pg (ref 27.0–33.0)
MCHC: 33.9 g/dL (ref 32.0–36.0)
MCV: 97.2 fL (ref 80.0–100.0)
MPV: 10.6 fL (ref 7.5–12.5)
Monocytes Relative: 10.6 %
Neutro Abs: 2907 cells/uL (ref 1500–7800)
Neutrophils Relative %: 67.6 %
Platelets: 233 10*3/uL (ref 140–400)
RBC: 3.94 10*6/uL (ref 3.80–5.10)
RDW: 12.2 % (ref 11.0–15.0)
Total Lymphocyte: 21.1 %
WBC: 4.3 10*3/uL (ref 3.8–10.8)

## 2019-04-27 LAB — COMPLETE METABOLIC PANEL WITH GFR
AG Ratio: 1.6 (calc) (ref 1.0–2.5)
ALT: 8 U/L (ref 6–29)
AST: 13 U/L (ref 10–30)
Albumin: 4.4 g/dL (ref 3.6–5.1)
Alkaline phosphatase (APISO): 40 U/L (ref 31–125)
BUN: 21 mg/dL (ref 7–25)
CO2: 24 mmol/L (ref 20–32)
Calcium: 9.5 mg/dL (ref 8.6–10.2)
Chloride: 106 mmol/L (ref 98–110)
Creat: 0.73 mg/dL (ref 0.50–1.10)
GFR, Est African American: 128 mL/min/{1.73_m2} (ref >=60)
GFR, Est Non African American: 111 mL/min/{1.73_m2} (ref >=60)
Globulin: 2.8 g/dL (calc) (ref 1.9–3.7)
Glucose, Bld: 79 mg/dL (ref 65–99)
Potassium: 4.6 mmol/L (ref 3.5–5.3)
Sodium: 138 mmol/L (ref 135–146)
Total Bilirubin: 0.4 mg/dL (ref 0.2–1.2)
Total Protein: 7.2 g/dL (ref 6.1–8.1)

## 2019-04-27 NOTE — Progress Notes (Signed)
Absolute eosinophil count is low. Rest of CBC WNL.  CMP WNL

## 2019-05-01 ENCOUNTER — Telehealth: Payer: Self-pay | Admitting: Rheumatology

## 2019-05-01 NOTE — Telephone Encounter (Signed)
Spoke with patient and advised of lab results

## 2019-05-01 NOTE — Telephone Encounter (Signed)
Patient called requesting a return call for her labwork results.   °

## 2019-05-11 ENCOUNTER — Encounter: Payer: Self-pay | Admitting: Rheumatology

## 2019-05-21 ENCOUNTER — Telehealth (INDEPENDENT_AMBULATORY_CARE_PROVIDER_SITE_OTHER): Payer: 59 | Admitting: Rheumatology

## 2019-05-21 ENCOUNTER — Other Ambulatory Visit: Payer: Self-pay | Admitting: Rheumatology

## 2019-05-21 ENCOUNTER — Other Ambulatory Visit: Payer: Self-pay

## 2019-05-21 DIAGNOSIS — M170 Bilateral primary osteoarthritis of knee: Secondary | ICD-10-CM

## 2019-05-21 DIAGNOSIS — M79671 Pain in right foot: Secondary | ICD-10-CM | POA: Diagnosis not present

## 2019-05-21 DIAGNOSIS — M79672 Pain in left foot: Secondary | ICD-10-CM

## 2019-05-21 DIAGNOSIS — M069 Rheumatoid arthritis, unspecified: Secondary | ICD-10-CM | POA: Diagnosis not present

## 2019-05-21 DIAGNOSIS — M0579 Rheumatoid arthritis with rheumatoid factor of multiple sites without organ or systems involvement: Secondary | ICD-10-CM

## 2019-05-21 DIAGNOSIS — M542 Cervicalgia: Secondary | ICD-10-CM

## 2019-05-21 DIAGNOSIS — M25474 Effusion, right foot: Secondary | ICD-10-CM

## 2019-05-21 DIAGNOSIS — Z79899 Other long term (current) drug therapy: Secondary | ICD-10-CM

## 2019-05-21 DIAGNOSIS — M25475 Effusion, left foot: Secondary | ICD-10-CM

## 2019-05-21 MED ORDER — METHOTREXATE (PF) 20 MG/0.4ML ~~LOC~~ SOAJ: 20.0000 mg | SUBCUTANEOUS | 0 refills | Status: DC

## 2019-05-21 NOTE — Progress Notes (Signed)
Virtual Visit via Telephone Note  I connected with Shannon David on 05/21/19 at  9:45 AM EST by telephone and verified that I am speaking with the correct person using two identifiers.  Location: Patient: Home  Provider: Clinic This service was conducted via virtual visit.    The patient was located at home. I was located in my office.  Consent was obtained prior to the virtual visit and is aware of possible charges through their insurance for this visit.  The patient is an established patient.  Dr. Corliss Skains, MD conducted the virtual visit .  Office staff helped with scheduling follow up visits after the service was conducted.   I discussed the limitations, risks, security and privacy concerns of performing an evaluation and management service by telephone and the availability of in person appointments. I also discussed with the patient that there may be a patient responsible charge related to this service. The patient expressed understanding and agreed to proceed.  CC:  History of Present Illness: Patient is a 28 year old female with a past medical history of seropositive rheumatoid arthritis and osteoarthritis.  She is taking Rinvoq 15 mg po daily, Rasuvo 17.5 mg sq once weekly, and folic acid 2 mg po dailly.  Patient states that she has been experiencing pain in her feet.  She has intermittent swelling in her feet.  For the last 2 days her left shoulder has been painful.  She describes her rheumatoid arthritis on the scale of 0-10 about 4.  She states since she added Zofran her nausea has resolved.  Review of Systems  Constitutional: Negative for fever and malaise/fatigue.  Eyes: Negative for photophobia, pain, discharge and redness.  Respiratory: Negative for cough, shortness of breath and wheezing.   Cardiovascular: Negative for chest pain and palpitations.  Gastrointestinal: Negative for blood in stool, constipation and diarrhea.  Genitourinary: Negative for dysuria.  Musculoskeletal:  Positive for joint pain. Negative for back pain, myalgias and neck pain.  Skin: Negative for rash.  Neurological: Negative for dizziness and headaches.  Psychiatric/Behavioral: Negative for depression. The patient is not nervous/anxious and does not have insomnia.       Observations/Objective: Physical Exam  Constitutional: She is oriented to person, place, and time.  Neurological: She is alert and oriented to person, place, and time.  Psychiatric: Mood, memory, affect and judgment normal.   Patient reports morning stiffness for 0 minute.   Patient denies nocturnal pain.  Difficulty dressing/grooming: Denies Difficulty climbing stairs: Denies Difficulty getting out of chair: Denies Difficulty using hands for taps, buttons, cutlery, and/or writing: Denies   Assessment and Plan: Visit Diagnoses: Rheumatoid arthritis involving multiple sites with positive rheumatoid factor (HCC) - Positive RF, positive anti-CCP, erosive disease with contractures and ulnar deviation: Patient complains of increased pain and discomfort in her feet with intermittent swelling.  Her left shoulder has been hurting.  She could not take higher dose of Rasuvo in the past due to nausea.  She states since she is on Zofran she has not had any nausea.  We discussed increasing dose of Rinvoq to 20 mg subcu weekly.  She was in agreement.  Have advised her to get labs in January as a schedule.  She will continue the combination of Rinvoq 15 mg a day and Rasuvo 20 mg/week along with folic acid.  If her symptoms persist she should notify us.  High risk medication use - Rinvoq 15 mg 1 tablet daily, Rasuvo 17.5 mg every 7 days, and folic 1  mg 2 tablets daily.  Her labs were normal in October.  Her lipid panel is due.  We will get a lipid panel with her next labs in January.  Primary osteoarthritis of both knees: She is currently not having much discomfort.   Neck pain -the stretching exercises has been helpful.  Other  medical conditions are listed as follows:   History of hypothyroidism  History of PCOS  History of depression   Follow Up Instructions: She will follow up in    I discussed the assessment and treatment plan with the patient. The patient was provided an opportunity to ask questions and all were answered. The patient agreed with the plan and demonstrated an understanding of the instructions.   The patient was advised to call back or seek an in-person evaluation if the symptoms worsen or if the condition fails to improve as anticipated.  I provided 15 minutes of non-face-to-face time during this encounter.   Bo Merino, MD   Scribed by-  Hazel Sams, PA-C

## 2019-05-21 NOTE — Telephone Encounter (Signed)
Last Visit: 04/26/2019 Next Visit: 08/28/2019 Labs: 04/26/2019 Absolute eosinophil count is low. Rest of CBC WNL. CMP WNL  Okay to refill per Dr. Estanislado Pandy.

## 2019-05-21 NOTE — Addendum Note (Signed)
Addended by: Earnestine Mealing on: 05/21/2019 03:42 PM   Modules accepted: Orders

## 2019-05-28 ENCOUNTER — Other Ambulatory Visit: Payer: Self-pay | Admitting: *Deleted

## 2019-05-28 ENCOUNTER — Telehealth: Payer: Self-pay | Admitting: Rheumatology

## 2019-05-28 MED ORDER — METHOTREXATE (PF) 20 MG/0.4ML ~~LOC~~ SOAJ: 20.0000 mg | SUBCUTANEOUS | 0 refills | Status: DC

## 2019-05-28 NOTE — Telephone Encounter (Signed)
Patient called stating she had a virtual appointment with Dr. Estanislado Pandy on 05/21/19 and she changed her prescription of Methotrexate.  Patient called [facility] today and was told they have not received her new prescription.

## 2019-05-28 NOTE — Telephone Encounter (Signed)
LMOM for patient, RX sent to [facility]- RX cancelled, RX sent to [facility].

## 2019-06-27 ENCOUNTER — Encounter: Payer: Self-pay | Admitting: Rheumatology

## 2019-06-28 NOTE — Telephone Encounter (Signed)
Please call patient to find out what exactly she wants [place] to write in the letter.  It is okay to give her a letter to her indicating the hours she can work.

## 2019-07-02 ENCOUNTER — Telehealth: Payer: Self-pay | Admitting: Rheumatology

## 2019-07-02 DIAGNOSIS — Z79899 Other long term (current) drug therapy: Secondary | ICD-10-CM | POA: Diagnosis not present

## 2019-07-02 NOTE — Telephone Encounter (Signed)
Patient called requesting her labwork orders be sent to [facility] in [place].  Patient states she will be going this morning 07/02/19.  Patient states her lipid panel was not included in her last labs and when she spoke with Dr. Corliss Skains she was told "she was not sure why they were not included."

## 2019-07-02 NOTE — Telephone Encounter (Signed)
Lab orders released.  

## 2019-07-03 DIAGNOSIS — Z0000 Encounter for general adult medical examination without abnormal findings: Secondary | ICD-10-CM | POA: Diagnosis not present

## 2019-07-03 DIAGNOSIS — Z6828 Body mass index (BMI) 28.0-28.9, adult: Secondary | ICD-10-CM | POA: Diagnosis not present

## 2019-07-03 LAB — CBC WITH DIFFERENTIAL/PLATELET
Basophils Absolute: 0 10*3/uL (ref 0.0–0.2)
Basos: 0 %
EOS (ABSOLUTE): 0 10*3/uL (ref 0.0–0.4)
Eos: 0 %
Hematocrit: 36.3 % (ref 34.0–46.6)
Hemoglobin: 12.5 g/dL (ref 11.1–15.9)
Lymphocytes Absolute: 1.3 10*3/uL (ref 0.7–3.1)
Lymphs: 18 %
MCH: 32.9 pg (ref 26.6–33.0)
MCHC: 34.4 g/dL (ref 31.5–35.7)
MCV: 96 fL (ref 79–97)
Monocytes Absolute: 0.6 10*3/uL (ref 0.1–0.9)
Monocytes: 8 %
Neutrophils Absolute: 5.1 10*3/uL (ref 1.4–7.0)
Neutrophils: 74 %
Platelets: 241 10*3/uL (ref 150–450)
RBC: 3.8 x10E6/uL (ref 3.77–5.28)
RDW: 13 % (ref 11.7–15.4)
WBC: 6.9 10*3/uL (ref 3.4–10.8)

## 2019-07-03 LAB — CMP14+EGFR
ALT: 10 IU/L (ref 0–32)
AST: 12 IU/L (ref 0–40)
Albumin/Globulin Ratio: 1.9 (ref 1.2–2.2)
Albumin: 4.3 g/dL (ref 3.9–5.0)
Alkaline Phosphatase: 45 IU/L (ref 39–117)
BUN/Creatinine Ratio: 22 (ref 9–23)
BUN: 15 mg/dL (ref 6–20)
Bilirubin Total: 0.4 mg/dL (ref 0.0–1.2)
CO2: 23 mmol/L (ref 20–29)
Calcium: 9.2 mg/dL (ref 8.7–10.2)
Chloride: 103 mmol/L (ref 96–106)
Creatinine, Ser: 0.69 mg/dL (ref 0.57–1.00)
GFR calc Af Amer: 135 mL/min/{1.73_m2} (ref >59)
GFR calc non Af Amer: 117 mL/min/{1.73_m2} (ref >59)
Globulin, Total: 2.3 g/dL (ref 1.5–4.5)
Glucose: 90 mg/dL (ref 65–99)
Potassium: 4.1 mmol/L (ref 3.5–5.2)
Sodium: 137 mmol/L (ref 134–144)
Total Protein: 6.6 g/dL (ref 6.0–8.5)

## 2019-07-03 LAB — LIPID PANEL
Chol/HDL Ratio: 2.2 ratio (ref 0.0–4.4)
Cholesterol, Total: 159 mg/dL (ref 100–199)
HDL: 71 mg/dL (ref >39)
LDL Chol Calc (NIH): 78 mg/dL (ref 0–99)
Triglycerides: 43 mg/dL (ref 0–149)
VLDL Cholesterol Cal: 10 mg/dL (ref 5–40)

## 2019-07-03 NOTE — Telephone Encounter (Signed)
CBC and CMP are normal.

## 2019-07-03 NOTE — Telephone Encounter (Signed)
Lipid panel WNL

## 2019-08-22 NOTE — Progress Notes (Deleted)
Office Visit Note  Patient: Shannon David             Date of Birth: 1990-12-25           MRN: [id number]             PCP: Eunice Blase, PA-C Referring: Eunice Blase, PA-C Visit Date: 08/28/2019 Occupation: @GUAROCC @  Subjective:  No chief complaint on file.   History of Present Illness: Shannon David is a 29 y.o. female ***   Activities of Daily Living:  Patient reports morning stiffness for *** {minute/hour:19697}.   Patient {ACTIONS;DENIES/REPORTS:21021675::"Denies"} nocturnal pain.  Difficulty dressing/grooming: {ACTIONS;DENIES/REPORTS:21021675::"Denies"} Difficulty climbing stairs: {ACTIONS;DENIES/REPORTS:21021675::"Denies"} Difficulty getting out of chair: {ACTIONS;DENIES/REPORTS:21021675::"Denies"} Difficulty using hands for taps, buttons, cutlery, and/or writing: {ACTIONS;DENIES/REPORTS:21021675::"Denies"}  No Rheumatology ROS completed.   PMFS History:  Patient Active Problem List   Diagnosis Date Noted  . History of PCOS 02/21/2017  . High risk medication use 09/21/2016  . Rheumatoid arthritis involving multiple sites with positive rheumatoid factor (HCC) 09/21/2016  . History of hypothyroidism 09/21/2016  . History of depression 09/21/2016    Past Medical History:  Diagnosis Date  . Arthritis     Family History  Problem Relation Age of Onset  . Hypertension Mother   . Clotting disorder Mother   . Cancer Maternal Grandmother        lung   Past Surgical History:  Procedure Laterality Date  . BUNIONECTOMY Right    Social History   Social History Narrative  . Not on file    There is no immunization history on file for this patient.   Objective: Vital Signs: There were no vitals taken for this visit.   Physical Exam   Musculoskeletal Exam: ***  CDAI Exam: CDAI Score: -- Patient Global: --; Provider Global: -- Swollen: --; Tender: -- Joint Exam 08/28/2019   No joint exam has been documented for this visit   There is currently no  information documented on the homunculus. Go to the Rheumatology activity and complete the homunculus joint exam.  Investigation: No additional findings.  Imaging: No results found.  Recent Labs: Lab Results  Component Value Date   WBC 6.9 07/02/2019   HGB 12.5 07/02/2019   PLT 241 07/02/2019   NA 137 07/02/2019   K 4.1 07/02/2019   CL 103 07/02/2019   CO2 23 07/02/2019   GLUCOSE 90 07/02/2019   BUN 15 07/02/2019   CREATININE 0.69 07/02/2019   BILITOT 0.4 07/02/2019   ALKPHOS 45 07/02/2019   AST 12 07/02/2019   ALT 10 07/02/2019   PROT 6.6 07/02/2019   ALBUMIN 4.3 07/02/2019   CALCIUM 9.2 07/02/2019   GFRAA 135 07/02/2019   QFTBGOLD Negative 08/13/2016   QFTBGOLDPLUS Negative 10/20/2018    Speciality Comments: Prior therapy: Orencia (inadequate response 9/18-9/19), Enbrel (inadequate response 3/15/5/15), Humira (inadequate response 11/17-3/18), and injectable methotrexate (non-compliance)  Procedures:  No procedures performed Allergies: Keflex [cephalexin]   Assessment / Plan:     Visit Diagnoses: No diagnosis found.  Orders: No orders of the defined types were placed in this encounter.  No orders of the defined types were placed in this encounter.   Face-to-face time spent with patient was *** minutes. Greater than 50% of time was spent in counseling and coordination of care.  Follow-Up Instructions: No follow-ups on file.   01-11-1997, CMA  Note - This record has been created using Ellen Henri.  Chart creation errors have been sought, but may not always  have been located.  Such creation errors do not reflect on  the standard of medical care.

## 2019-08-27 NOTE — Progress Notes (Deleted)
Office Visit Note  Patient: Shannon David             Date of Birth: 28-Apr-1991           MRN: [id number]             PCP: Eunice Blase, PA-C Referring: Eunice Blase, PA-C Visit Date: 08/31/2019 Occupation: @GUAROCC @  Subjective:  No chief complaint on file.   History of Present Illness: Shannon David is a 29 y.o. female ***   Activities of Daily Living:  Patient reports morning stiffness for *** {minute/hour:19697}.   Patient {ACTIONS;DENIES/REPORTS:21021675::"Denies"} nocturnal pain.  Difficulty dressing/grooming: {ACTIONS;DENIES/REPORTS:21021675::"Denies"} Difficulty climbing stairs: {ACTIONS;DENIES/REPORTS:21021675::"Denies"} Difficulty getting out of chair: {ACTIONS;DENIES/REPORTS:21021675::"Denies"} Difficulty using hands for taps, buttons, cutlery, and/or writing: {ACTIONS;DENIES/REPORTS:21021675::"Denies"}  No Rheumatology ROS completed.   PMFS History:  Patient Active Problem List   Diagnosis Date Noted  . History of PCOS 02/21/2017  . High risk medication use 09/21/2016  . Rheumatoid arthritis involving multiple sites with positive rheumatoid factor (HCC) 09/21/2016  . History of hypothyroidism 09/21/2016  . History of depression 09/21/2016    Past Medical History:  Diagnosis Date  . Arthritis     Family History  Problem Relation Age of Onset  . Hypertension Mother   . Clotting disorder Mother   . Cancer Maternal Grandmother        lung   Past Surgical History:  Procedure Laterality Date  . BUNIONECTOMY Right    Social History   Social History Narrative  . Not on file    There is no immunization history on file for this patient.   Objective: Vital Signs: There were no vitals taken for this visit.   Physical Exam   Musculoskeletal Exam: ***  CDAI Exam: CDAI Score: -- Patient Global: --; Provider Global: -- Swollen: --; Tender: -- Joint Exam 08/31/2019   No joint exam has been documented for this visit   There is currently no  information documented on the homunculus. Go to the Rheumatology activity and complete the homunculus joint exam.  Investigation: No additional findings.  Imaging: No results found.  Recent Labs: Lab Results  Component Value Date   WBC 6.9 07/02/2019   HGB 12.5 07/02/2019   PLT 241 07/02/2019   NA 137 07/02/2019   K 4.1 07/02/2019   CL 103 07/02/2019   CO2 23 07/02/2019   GLUCOSE 90 07/02/2019   BUN 15 07/02/2019   CREATININE 0.69 07/02/2019   BILITOT 0.4 07/02/2019   ALKPHOS 45 07/02/2019   AST 12 07/02/2019   ALT 10 07/02/2019   PROT 6.6 07/02/2019   ALBUMIN 4.3 07/02/2019   CALCIUM 9.2 07/02/2019   GFRAA 135 07/02/2019   QFTBGOLD Negative 08/13/2016   QFTBGOLDPLUS Negative 10/20/2018    Speciality Comments: Prior therapy: Orencia (inadequate response 9/18-9/19), Enbrel (inadequate response 3/15/5/15), Humira (inadequate response 11/17-3/18), and injectable methotrexate (non-compliance)  Procedures:  No procedures performed Allergies: Keflex [cephalexin]   Assessment / Plan:     Visit Diagnoses: No diagnosis found.  Orders: No orders of the defined types were placed in this encounter.  No orders of the defined types were placed in this encounter.   Face-to-face time spent with patient was *** minutes. Greater than 50% of time was spent in counseling and coordination of care.  Follow-Up Instructions: No follow-ups on file.   01-11-1997, CMA  Note - This record has been created using Ellen Henri.  Chart creation errors have been sought, but may not always  have been located.  Such creation errors do not reflect on  the standard of medical care.

## 2019-08-28 ENCOUNTER — Ambulatory Visit: Payer: 59 | Admitting: Physician Assistant

## 2019-08-31 ENCOUNTER — Ambulatory Visit: Payer: 59 | Admitting: Physician Assistant

## 2019-09-05 ENCOUNTER — Telehealth: Payer: Self-pay | Admitting: Rheumatology

## 2019-09-05 NOTE — Telephone Encounter (Signed)
Returned patient call.  Explained that received a co-pay is high right now due to her not needing her deductible and co-pay card only applies $125 per fill.  Patient verbalized understanding.  Patient states that she has been in a similar situation with Rinvoq around the new year.  Advised that Rinvoq co-pay card does not have a maximum amount applied per refill but is per year and that should help meet her deductible.  She states that she is due to refill her Rinvoq which may help her meet her deductible.  She is due for her methotrexate injection today.  Advised that patient may come for a samples.  If she continues to have issues affording Rasuvo advised that we will need to switch to vial and syringe or oral methotrexate.  Patient verbalized understanding.  She states she will be able to come in tomorrow to pick up samples.  All questions encouraged and answered.  Instructed patient to call with any further questions or concerns.  Verlin Fester, PharmD, [place], CPP Clinical Specialty Pharmacist [phone]  09/05/2019 9:08 AM

## 2019-09-05 NOTE — Telephone Encounter (Signed)
Called [facility], patient's copay is due to her unmet 2021 deductible. Rasuvo copay card will only pay a maximum of $125 per fill.  Phone# [phone]  8:55 AM Dorthula Nettles, CPhT

## 2019-09-05 NOTE — Telephone Encounter (Signed)
Patient left a voicemail stating she had a change with her insurance and was told by the specialty pharmacy that her prescription of Rasuvo is now $1,400.  Patient is requesting a return call.

## 2019-09-06 NOTE — Telephone Encounter (Signed)
Medication Samples have been provided to the patient.  Drug name: Rasuvo 20 mg            Qty: 4   LOT: [id number]   Exp.Date: 08/2020  Dosing instructions: Inject 1 pen every 7 days  The patient has been instructed regarding the correct time, dose, and frequency of taking this medication, including desired effects and most common side effects.   Verlin Fester, PharmD, [place], CPP Clinical Specialty Pharmacist [phone]  09/06/2019 10:54 AM

## 2019-09-10 NOTE — Progress Notes (Signed)
Office Visit Note  Patient: Shannon David             Date of Birth: February 28, 1991           MRN: [id number]             PCP: Janine Limbo, PA-C Referring: Janine Limbo, PA-C Visit Date: 09/19/2019 Occupation: @GUAROCC @  Subjective:  Pain in multiple joints   History of Present Illness: Shannon David is a 29 y.o. female with history of seropositive rheumatoid arthritis.  She is taking Rinvoq 15 mg 1 tablet by mouth daily and Rasuvo 20 mg sq once weekly.  She has not missed any doses recently.  She states she has been having flares once weekly lasting about 2 days.  She states she had severe flare 1 week ago at which time her left knee joint was painful and swollen.  She states she developed some discomfort in her right hip from compensating when walking.  She is also having increased discomfort in the left shoulder joint.  She has been having nocturnal pain when laying on her left side at night.  She experiences intermittent pain and swelling in both hands and the wrist wrist joint.  She has been taking ibuprofen for pain relief.    Activities of Daily Living:  Patient reports morning stiffness for several hours.   Patient Reports nocturnal pain.  Difficulty dressing/grooming: Reports Difficulty climbing stairs: Denies Difficulty getting out of chair: Denies Difficulty using hands for taps, buttons, cutlery, and/or writing: Denies  Review of Systems  Constitutional: Positive for fatigue.  HENT: Negative for mouth sores, mouth dryness and nose dryness.   Eyes: Negative for pain, itching, visual disturbance and dryness.  Respiratory: Negative for cough, hemoptysis, shortness of breath and difficulty breathing.   Cardiovascular: Negative for chest pain, palpitations, hypertension and swelling in legs/feet.  Gastrointestinal: Negative for blood in stool, constipation and diarrhea.  Endocrine: Negative for increased urination.  Genitourinary: Negative for difficulty urinating and  painful urination.  Musculoskeletal: Positive for arthralgias, joint pain, joint swelling and morning stiffness. Negative for myalgias, muscle weakness, muscle tenderness and myalgias.  Skin: Negative for color change, pallor, rash, hair loss, nodules/bumps, redness, skin tightness, ulcers and sensitivity to sunlight.  Allergic/Immunologic: Negative for susceptible to infections.  Neurological: Negative for dizziness, numbness, headaches and memory loss.  Hematological: Negative for swollen glands.  Psychiatric/Behavioral: Negative for depressed mood, confusion and sleep disturbance. The patient is not nervous/anxious.     PMFS History:  Patient Active Problem List   Diagnosis Date Noted  . History of PCOS 02/21/2017  . High risk medication use 09/21/2016  . Rheumatoid arthritis involving multiple sites with positive rheumatoid factor ([place]) 09/21/2016  . History of hypothyroidism 09/21/2016  . History of depression 09/21/2016    Past Medical History:  Diagnosis Date  . Arthritis     Family History  Problem Relation Age of Onset  . Hypertension Mother   . Clotting disorder Mother   . Cancer Maternal Grandmother        lung   Past Surgical History:  Procedure Laterality Date  . BUNIONECTOMY Right    Social History   Social History Narrative  . Not on file    There is no immunization history on file for this patient.   Objective: Vital Signs: BP 121/83 (BP Location: Right Arm, Patient Position: Sitting, Cuff Size: Normal)   Pulse 73   Resp 12   Ht 5\' 2"  (1.575 m)   Wt 162 lb  3.2 oz (73.6 kg)   BMI 29.67 kg/m    Physical Exam Vitals and nursing note reviewed.  Constitutional:      Appearance: She is well-developed.  HENT:     Head: Normocephalic and atraumatic.  Eyes:     Conjunctiva/sclera: Conjunctivae normal.  Pulmonary:     Effort: Pulmonary effort is normal.  Abdominal:     General: Bowel sounds are normal.     Palpations: Abdomen is soft.    Musculoskeletal:     Cervical back: Normal range of motion.  Lymphadenopathy:     Cervical: No cervical adenopathy.  Skin:    General: Skin is warm and dry.     Capillary Refill: Capillary refill takes less than 2 seconds.  Neurological:     Mental Status: She is alert and oriented to person, place, and time.  Psychiatric:        Behavior: Behavior normal.      Musculoskeletal Exam: C-spine, thoracic spine, and lumbar spine good ROM.  Shoulder joints, elbow joints, wrist joints, MCPs, PIPs, and DIPs good ROM with no synovitis.  Synovial thickening of bilateral 1st, 2nd, and 3rd MCP joints. Ulnar deviation bilaterally.  Hip joints, knee joints, ankle joints good ROM with no discomfort.  No warmth or effusion of knee joints.  No tenderness or swelling of ankle joints.   CDAI Exam: CDAI Score: -- Patient Global: --; Provider Global: -- Swollen: --; Tender: -- Joint Exam 09/19/2019   No joint exam has been documented for this visit   There is currently no information documented on the homunculus. Go to the Rheumatology activity and complete the homunculus joint exam.  Investigation: No additional findings.  Imaging: No results found.  Recent Labs: Lab Results  Component Value Date   WBC 6.9 07/02/2019   HGB 12.5 07/02/2019   PLT 241 07/02/2019   NA 137 07/02/2019   K 4.1 07/02/2019   CL 103 07/02/2019   CO2 23 07/02/2019   GLUCOSE 90 07/02/2019   BUN 15 07/02/2019   CREATININE 0.69 07/02/2019   BILITOT 0.4 07/02/2019   ALKPHOS 45 07/02/2019   AST 12 07/02/2019   ALT 10 07/02/2019   PROT 6.6 07/02/2019   ALBUMIN 4.3 07/02/2019   CALCIUM 9.2 07/02/2019   GFRAA 135 07/02/2019   QFTBGOLD Negative 08/13/2016   QFTBGOLDPLUS Negative 10/20/2018    Speciality Comments: Prior therapy: Orencia (inadequate response 9/18-9/19), Enbrel (inadequate response 3/15/5/15), Humira (inadequate response 11/17-3/18), and injectable methotrexate (non-compliance)  Procedures:  No  procedures performed Allergies: Keflex [cephalexin]   Assessment / Plan:     Visit Diagnoses: Rheumatoid arthritis involving multiple sites with positive rheumatoid factor (HCC) - Positive RF, positive anti-CCP, erosive disease with contractures and ulnar deviation: She has no synovitis on exam today.  She has been having frequent rheumatoid arthritis flares involving multiple joints since January 2021.  She reports 1 week ago she was having pain and swelling in the left knee joint, right wrist, and left shoulder.  She has been taking ibuprofen during flares which typically resolve her symptoms within about 2 days.  She has had to miss several days of work due to frequent flares.  She is taking Rinvoq 15 mg 1 tablet daily, Rasuvo 20 mg sq injections once weekly, and folic acid 2 mg by mouth daily.  She has had an inadequate response to Enbrel, Humira, and Orencia in the past.  Hubbard Hartshorn has been very expensive for the patient so we will switch her to vial and  syringe methotrexate.  She will increase to methotrexate 1 mL once weekly and continue taking Rasuvo as prescribed.  A prednisone taper starting at 20 mg tapering by 5 mg every 4 days will be sent to the pharmacy for the patient to take as needed during flares.  She was advised to notify us if she continues to have recurrent flares after increasing the dose of methotrexate.  She will follow-up in the office in 3 months.  High risk medication use - Rinvoq 15 mg 1 tablet daily, MTX 1.0 ml sq every 7 days, and folic 1 mg 2 tablets daily.  CBC, CMP, and TB gold will be checked today.  She will return for lab work in June and every 3 months to monitor for drug toxicity.  Standing orders are in place.- Plan: CBC with Differential/Platelet, COMPLETE METABOLIC PANEL WITH GFR, QuantiFERON-TB Gold Plus  Primary osteoarthritis of both knees: She experiences intermittent discomfort in both knee joints.  She had a flare in the left knee 1 week ago.  She is good range  of motion of both knee joints on exam today.  No warmth or effusion was noted.  Other medical conditions are listed as follows:   History of hypothyroidism  History of depression  History of PCOS  Orders: Orders Placed This Encounter  Procedures  . CBC with Differential/Platelet  . COMPLETE METABOLIC PANEL WITH GFR  . QuantiFERON-TB Gold Plus   Meds ordered this encounter  Medications  . predniSONE (DELTASONE) 5 MG tablet    Sig: Take 4 tablets by mouth once daily x4 days, 3 tablets by mouth daily x4 days, 2 tablets by mouth daily x4 days, 1 tablet by mouth daily x4 days.    Dispense:  40 tablet    Refill:  0    Face-to-face time spent with patient was 30 minutes. Greater than 50% of time was spent in counseling and coordination of care.  Follow-Up Instructions: Return in about 3 months (around 12/20/2019) for Rheumatoid arthritis.   Gearldine Bienenstock, PA-C  Note - This record has been created using Dragon software.  Chart creation errors have been sought, but may not always  have been located. Such creation errors do not reflect on  the standard of medical care.

## 2019-09-14 ENCOUNTER — Encounter: Payer: Self-pay | Admitting: Rheumatology

## 2019-09-14 NOTE — Telephone Encounter (Signed)
ok 

## 2019-09-19 ENCOUNTER — Encounter: Payer: Self-pay | Admitting: Physician Assistant

## 2019-09-19 ENCOUNTER — Ambulatory Visit (INDEPENDENT_AMBULATORY_CARE_PROVIDER_SITE_OTHER): Payer: BC Managed Care – PPO | Admitting: Physician Assistant

## 2019-09-19 ENCOUNTER — Telehealth: Payer: Self-pay | Admitting: Rheumatology

## 2019-09-19 ENCOUNTER — Other Ambulatory Visit: Payer: Self-pay

## 2019-09-19 VITALS — BP 121/83 | HR 73 | Resp 12 | Ht 62.0 in | Wt 162.2 lb

## 2019-09-19 DIAGNOSIS — Z8639 Personal history of other endocrine, nutritional and metabolic disease: Secondary | ICD-10-CM

## 2019-09-19 DIAGNOSIS — Z79899 Other long term (current) drug therapy: Secondary | ICD-10-CM | POA: Diagnosis not present

## 2019-09-19 DIAGNOSIS — Z8659 Personal history of other mental and behavioral disorders: Secondary | ICD-10-CM

## 2019-09-19 DIAGNOSIS — M0579 Rheumatoid arthritis with rheumatoid factor of multiple sites without organ or systems involvement: Secondary | ICD-10-CM | POA: Diagnosis not present

## 2019-09-19 DIAGNOSIS — Z111 Encounter for screening for respiratory tuberculosis: Secondary | ICD-10-CM | POA: Diagnosis not present

## 2019-09-19 DIAGNOSIS — Z8742 Personal history of other diseases of the female genital tract: Secondary | ICD-10-CM

## 2019-09-19 DIAGNOSIS — M170 Bilateral primary osteoarthritis of knee: Secondary | ICD-10-CM | POA: Diagnosis not present

## 2019-09-19 MED ORDER — PREDNISONE 5 MG PO TABS: ORAL_TABLET | ORAL | 0 refills | Status: DC

## 2019-09-19 MED ORDER — METHOTREXATE SODIUM CHEMO INJECTION 50 MG/2ML: 25.0000 mg | INTRAMUSCULAR | 0 refills | Status: DC

## 2019-09-19 MED ORDER — "TUBERCULIN SYRINGE 27G X 1/2"" 1 ML MISC": 12.0000 | 3 refills | Status: DC

## 2019-09-19 NOTE — Telephone Encounter (Signed)
Patient dropped off FMLA form to be filled out. Release of information was completed, and $25.00 in cash was paid. All was forwarded to [employer] attn. Vern 09/19/19

## 2019-09-19 NOTE — Progress Notes (Signed)
Pharmacy Note  Subjective: Patient presents today to [facility] for follow up office visit. Patient seen by the pharmacist for counseling on methotrexate injection for rheumatoid arthritis. Prior therapy includes:Orencia (inadequate response 9/18-9/19), Enbrel (inadequate response 3/15/5/15), Humira (inadequate response 11/17-3/18), and injectable methotrexate (non-compliance).  Patient is having difficulty affording Rasuvo pens.  Objective: CBC    Component Value Date/Time   WBC 6.9 07/02/2019 1045   WBC 4.3 04/26/2019 0927   RBC 3.80 07/02/2019 1045   RBC 3.94 04/26/2019 0927   HGB 12.5 07/02/2019 1045   HCT 36.3 07/02/2019 1045   PLT 241 07/02/2019 1045   MCV 96 07/02/2019 1045   MCH 32.9 07/02/2019 1045   MCH 33.0 04/26/2019 0927   MCHC 34.4 07/02/2019 1045   MCHC 33.9 04/26/2019 0927   RDW 13.0 07/02/2019 1045   LYMPHSABS 1.3 07/02/2019 1045   MONOABS 62 (L) 12/22/2016 1349   EOSABS 0.0 07/02/2019 1045   BASOSABS 0.0 07/02/2019 1045    CMP     Component Value Date/Time   NA 137 07/02/2019 1045   K 4.1 07/02/2019 1045   CL 103 07/02/2019 1045   CO2 23 07/02/2019 1045   GLUCOSE 90 07/02/2019 1045   GLUCOSE 79 04/26/2019 0927   BUN 15 07/02/2019 1045   CREATININE 0.69 07/02/2019 1045   CREATININE 0.73 04/26/2019 0927   CALCIUM 9.2 07/02/2019 1045   PROT 6.6 07/02/2019 1045   ALBUMIN 4.3 07/02/2019 1045   AST 12 07/02/2019 1045   ALT 10 07/02/2019 1045   ALKPHOS 45 07/02/2019 1045   BILITOT 0.4 07/02/2019 1045   GFRNONAA 117 07/02/2019 1045   GFRNONAA 111 04/26/2019 0927   GFRAA 135 07/02/2019 1045   GFRAA 128 04/26/2019 7782   Assessment/Plan: Patient is unable to afford Rasuvo pens at this time.  She switched from [place] healthcare to [facility] and now has a higher deductible.  She has a Rasuvo co-pay card but co-pay is still almost $1500 for a 90-day supply.  There are currently no grants available and we do not have samples for her  increased dose of 25 mg/week.  Patient given patient assistance application to apply to receive Rasuvo free of charge from the manufacturer.  I cannot guarantee she will qualify but it is worth submitting application.  Patient will return application along with income documents to the office to fax for approval.  Discussed using vial and syringe.  Patient has had difficulties using vial and syringe in the past.  States she watched a YouTube video how to inject.  She had difficulties when she would related the needle from the vial after drawing up the medication.  She states that the medication with immediately start to run from the syringe.  Patient states she was injecting 0.8 mL of air into the vial for her dose of 0.8 mL.  Advised patient the issue might be that she is injecting air for the full volume of her dose.  Instructed patient to insert half the volume of her current dose.  Her dose was increased today to 25 mg so she will be injecting 1 mL.  She should be injecting 0.5 ml.  Patient verbalized understanding.  Educated patient on how to use a vial and syringe and reviewed injection technique with patient.  Patient was able to demonstrate proper technique for injections using vial and syringe.  Provided patient educational material regarding injection technique and storage of methotrexate.    Prescription for methotrexate 1 ml  every 7 days and syringes sent to [facility] in [place] per patient request.  All questions encouraged and answered.  Instructed patient to call with any questions or concerns.   Verlin Fester, PharmD, [place], CPP Clinical Specialty Pharmacist [phone]  09/19/2019 9:07 AM

## 2019-09-20 NOTE — Progress Notes (Signed)
RBC count is borderline low.  Absolute eosinophils low but stable.  CMP WNL. We will continue to monitor.

## 2019-09-21 LAB — CBC WITH DIFFERENTIAL/PLATELET
Absolute Monocytes: 488 cells/uL (ref 200–950)
Basophils Absolute: 20 cells/uL (ref 0–200)
Basophils Relative: 0.3 %
Eosinophils Absolute: 0 cells/uL — ABNORMAL LOW (ref 15–500)
Eosinophils Relative: 0 %
HCT: 36.3 % (ref 35.0–45.0)
Hemoglobin: 12.3 g/dL (ref 11.7–15.5)
Lymphs Abs: 1049 cells/uL (ref 850–3900)
MCH: 32.6 pg (ref 27.0–33.0)
MCHC: 33.9 g/dL (ref 32.0–36.0)
MCV: 96.3 fL (ref 80.0–100.0)
MPV: 10.4 fL (ref 7.5–12.5)
Monocytes Relative: 7.4 %
Neutro Abs: 5042 cells/uL (ref 1500–7800)
Neutrophils Relative %: 76.4 %
Platelets: 257 10*3/uL (ref 140–400)
RBC: 3.77 10*6/uL — ABNORMAL LOW (ref 3.80–5.10)
RDW: 11.7 % (ref 11.0–15.0)
Total Lymphocyte: 15.9 %
WBC: 6.6 10*3/uL (ref 3.8–10.8)

## 2019-09-21 LAB — QUANTIFERON-TB GOLD PLUS
Mitogen-NIL: >10 IU/mL
NIL: 0.02 IU/mL
QuantiFERON-TB Gold Plus: NEGATIVE
TB1-NIL: 0.02 IU/mL
TB2-NIL: 0.01 IU/mL

## 2019-09-21 LAB — COMPLETE METABOLIC PANEL WITHOUT GFR
AG Ratio: 1.5 (calc) (ref 1.0–2.5)
ALT: 13 U/L (ref 6–29)
AST: 15 U/L (ref 10–30)
Albumin: 4.3 g/dL (ref 3.6–5.1)
Alkaline phosphatase (APISO): 46 U/L (ref 31–125)
BUN: 21 mg/dL (ref 7–25)
CO2: 23 mmol/L (ref 20–32)
Calcium: 9.1 mg/dL (ref 8.6–10.2)
Chloride: 104 mmol/L (ref 98–110)
Creat: 0.62 mg/dL (ref 0.50–1.10)
GFR, Est African American: 140 mL/min/1.73m2
GFR, Est Non African American: 121 mL/min/1.73m2
Globulin: 2.8 g/dL (ref 1.9–3.7)
Glucose, Bld: 78 mg/dL (ref 65–99)
Potassium: 4.6 mmol/L (ref 3.5–5.3)
Sodium: 136 mmol/L (ref 135–146)
Total Bilirubin: 0.4 mg/dL (ref 0.2–1.2)
Total Protein: 7.1 g/dL (ref 6.1–8.1)

## 2019-09-24 ENCOUNTER — Encounter: Payer: Self-pay | Admitting: Rheumatology

## 2019-09-24 ENCOUNTER — Other Ambulatory Visit: Payer: Self-pay | Admitting: Rheumatology

## 2019-09-24 DIAGNOSIS — M2041 Other hammer toe(s) (acquired), right foot: Secondary | ICD-10-CM | POA: Diagnosis not present

## 2019-09-24 DIAGNOSIS — M25571 Pain in right ankle and joints of right foot: Secondary | ICD-10-CM | POA: Diagnosis not present

## 2019-09-24 DIAGNOSIS — M7751 Other enthesopathy of right foot: Secondary | ICD-10-CM | POA: Diagnosis not present

## 2019-09-24 NOTE — Telephone Encounter (Signed)
Last Visit: 09/19/19 Next Visit: 12/19/19  Okay to refill per Dr. Corliss Skains

## 2019-09-24 NOTE — Progress Notes (Signed)
TB gold negative

## 2019-09-25 NOTE — Telephone Encounter (Signed)
Dr. Corliss Skains and I reviewed the x-ray.  Did Dr. Elvin So make any recommendations?  She in on the maximum dose of MTX and Rinvoq.

## 2019-10-17 IMAGING — CR DG CHEST 2V
2 series · 2 of 2 positions shown · non-contrast
Comparison: None.

CLINICAL DATA: Chest pain and cough

EXAM:
CHEST - 2 VIEW

[chest pa]
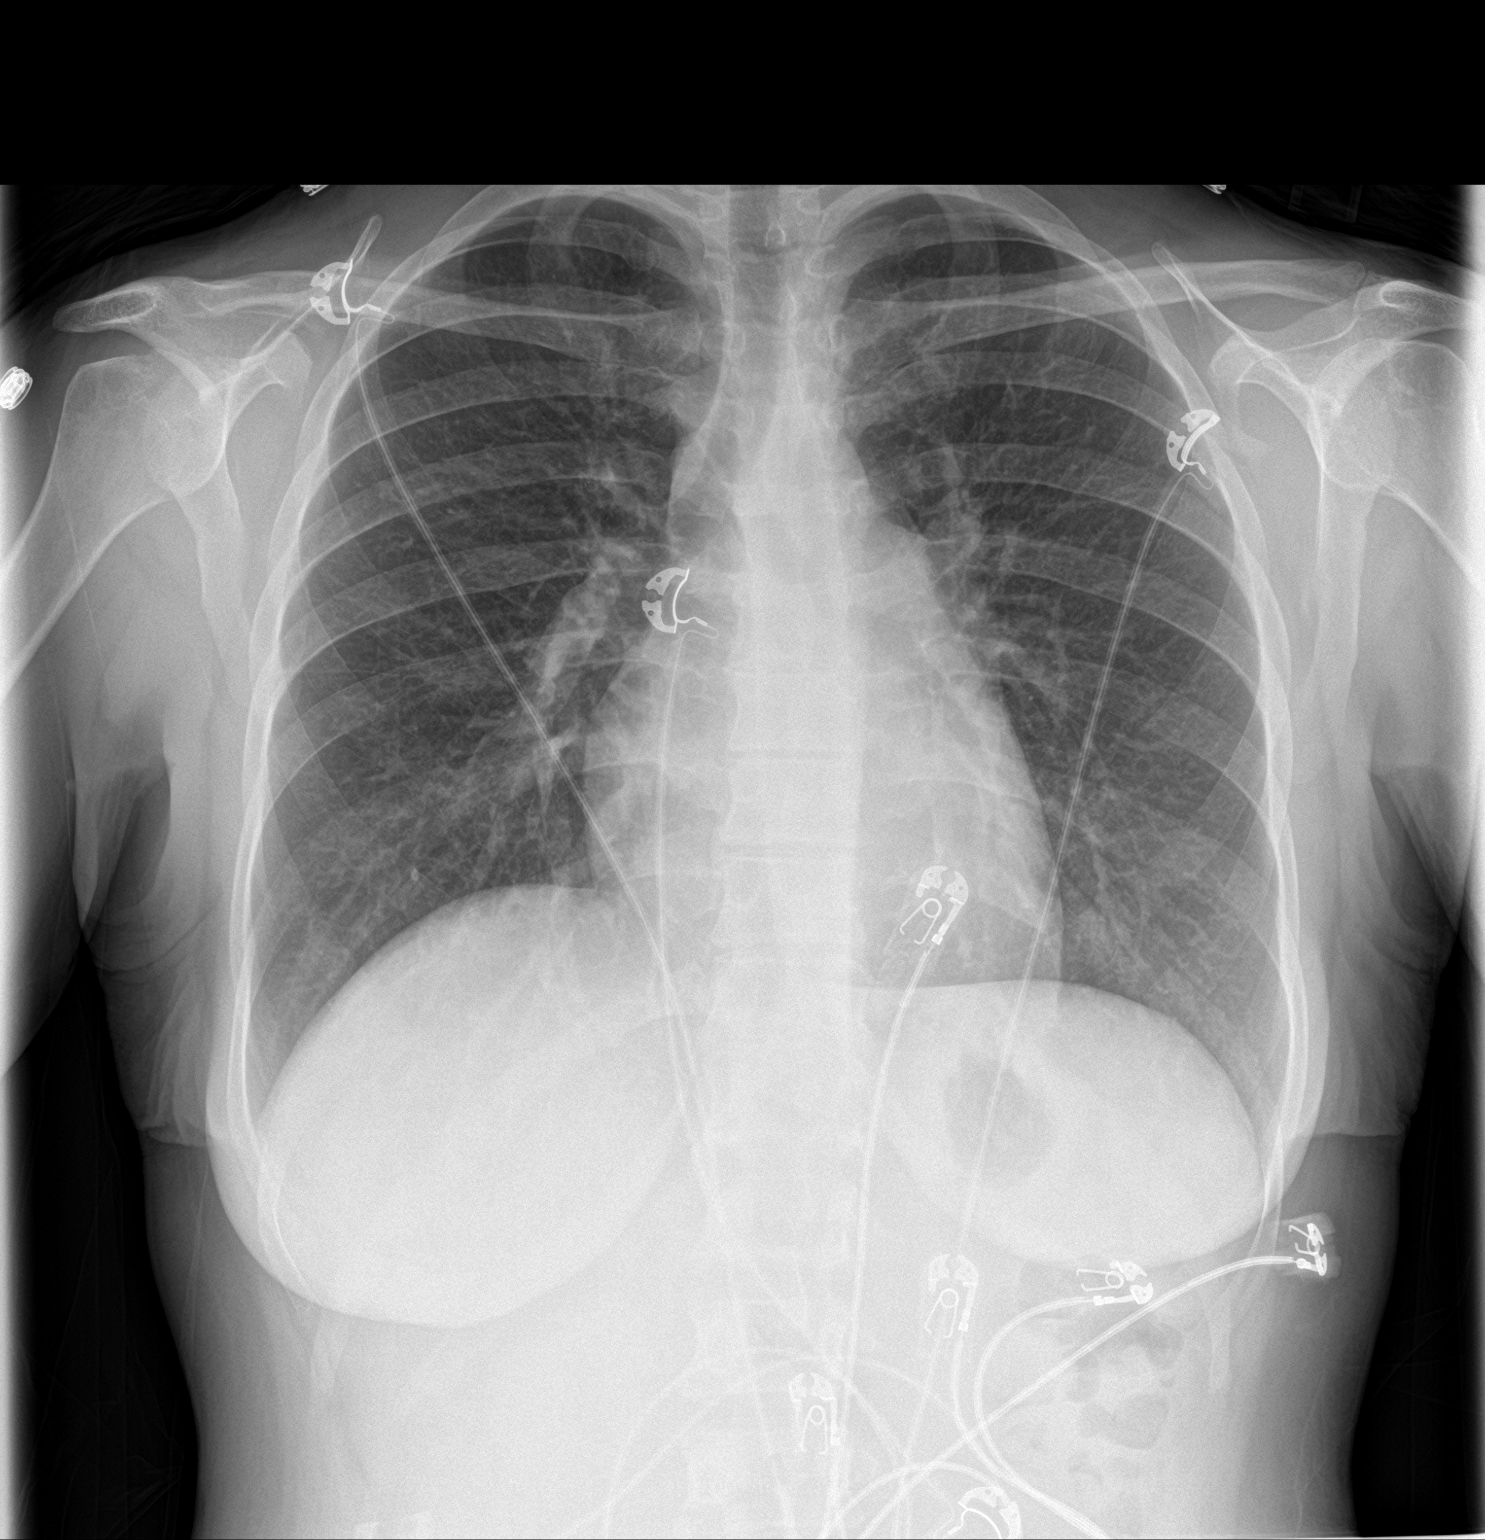

[chest lat]
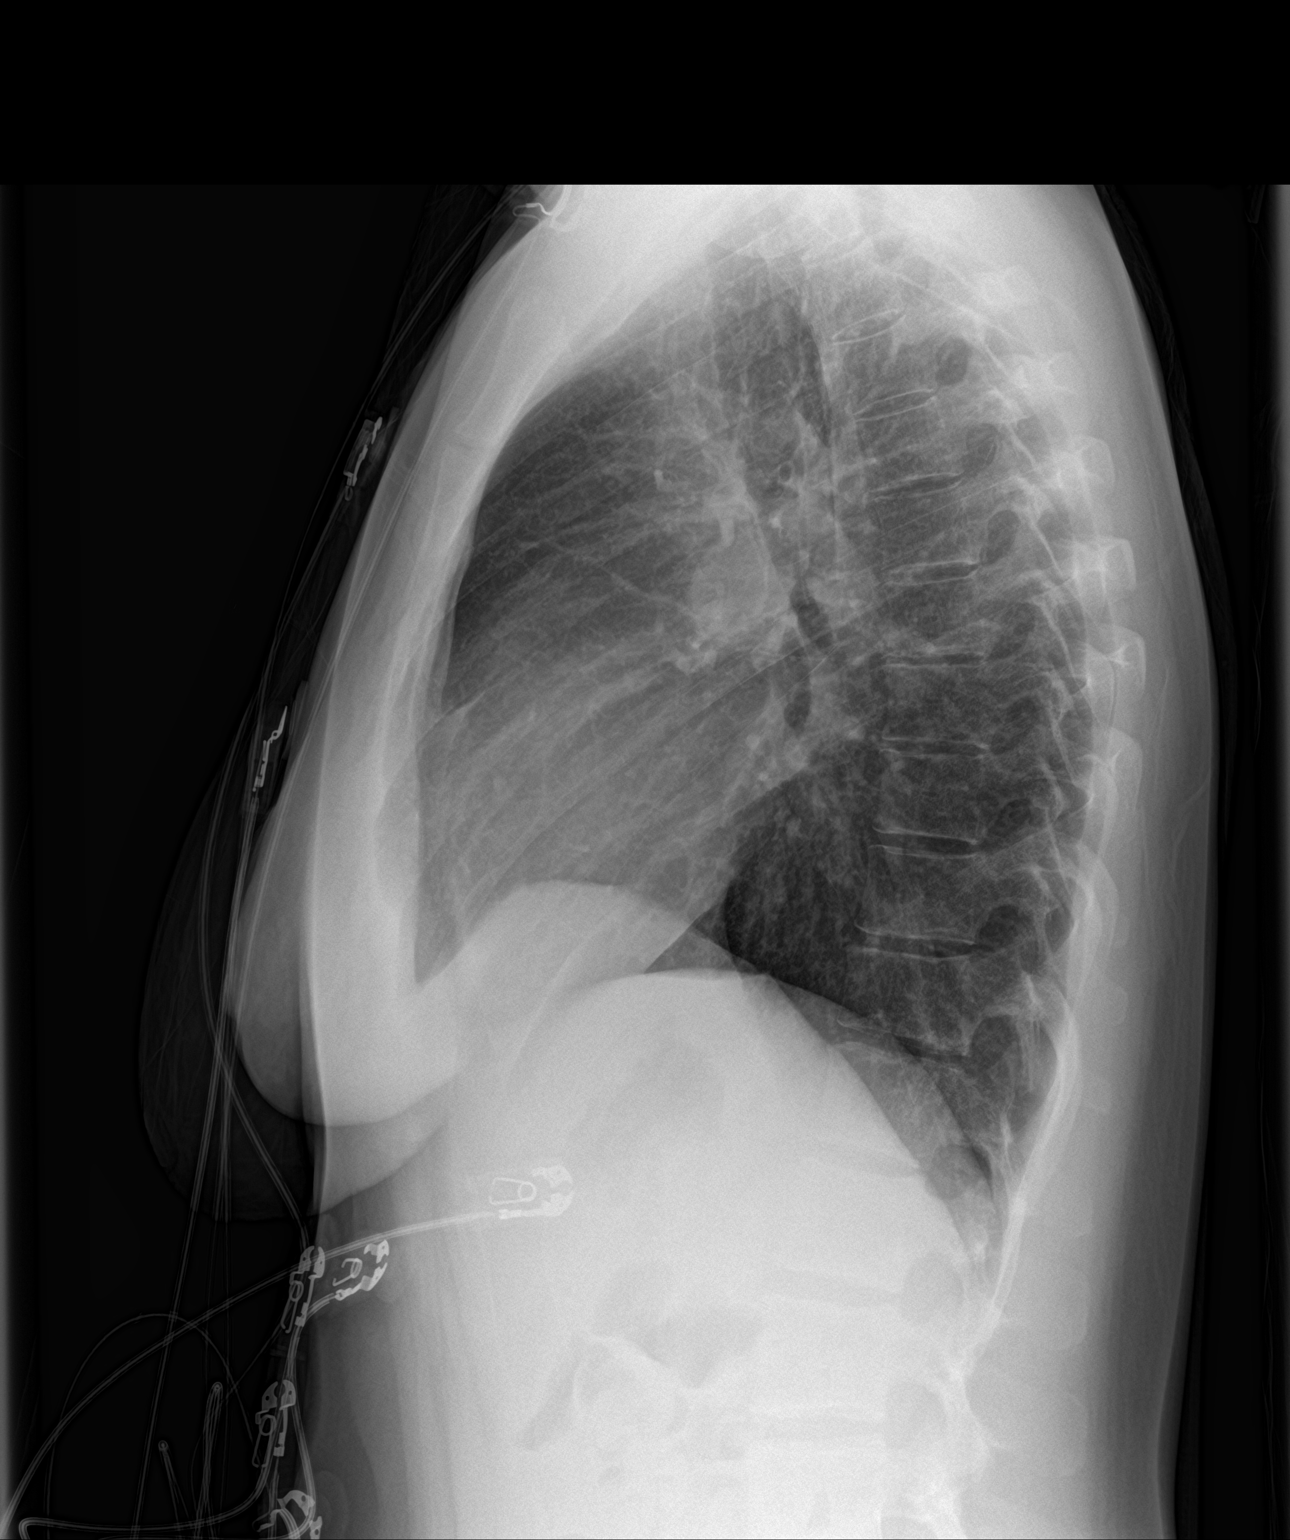

[2 of 2 positions shown; findings below may reference images not displayed]

FINDINGS: Lungs are clear. Heart size and pulmonary vascularity are normal. No
adenopathy. No bone lesions.
IMPRESSION: No edema or consolidation.

## 2019-11-08 DIAGNOSIS — J069 Acute upper respiratory infection, unspecified: Secondary | ICD-10-CM | POA: Diagnosis not present

## 2019-11-08 DIAGNOSIS — J029 Acute pharyngitis, unspecified: Secondary | ICD-10-CM | POA: Diagnosis not present

## 2019-11-08 DIAGNOSIS — Z20828 Contact with and (suspected) exposure to other viral communicable diseases: Secondary | ICD-10-CM | POA: Diagnosis not present

## 2019-11-12 DIAGNOSIS — J324 Chronic pansinusitis: Secondary | ICD-10-CM | POA: Diagnosis not present

## 2019-11-15 ENCOUNTER — Encounter: Payer: Self-pay | Admitting: Rheumatology

## 2019-11-15 DIAGNOSIS — Z7689 Persons encountering health services in other specified circumstances: Secondary | ICD-10-CM

## 2019-11-15 NOTE — Telephone Encounter (Signed)
Please refer patient to Dr. Ayesha Mohair.

## 2019-11-16 NOTE — Telephone Encounter (Signed)
Referral placed to Dr. Ayesha Mohair.

## 2019-11-20 ENCOUNTER — Other Ambulatory Visit: Payer: Self-pay | Admitting: Rheumatology

## 2019-11-20 DIAGNOSIS — M0579 Rheumatoid arthritis with rheumatoid factor of multiple sites without organ or systems involvement: Secondary | ICD-10-CM

## 2019-11-20 NOTE — Telephone Encounter (Signed)
Last Visit: 09/19/19 Next Visit: 12/19/19 Labs: 09/19/2019 RBC count is borderline low. Absolute eosinophils low but stable. CMP WNL. We will continue to monitor.   Current Dose per office note on 09/19/2019: Rinvoq 15 mg 1 tablet daily  Okay to refill per Dr. Corliss Skains

## 2019-11-29 DIAGNOSIS — Z79899 Other long term (current) drug therapy: Secondary | ICD-10-CM | POA: Diagnosis not present

## 2019-11-30 LAB — CBC WITH DIFFERENTIAL/PLATELET
Basophils Absolute: 0 10*3/uL (ref 0.0–0.2)
Basos: 1 %
EOS (ABSOLUTE): 0.1 10*3/uL (ref 0.0–0.4)
Eos: 2 %
Hematocrit: 38.3 % (ref 34.0–46.6)
Hemoglobin: 13.4 g/dL (ref 11.1–15.9)
Immature Grans (Abs): 0 10*3/uL (ref 0.0–0.1)
Immature Granulocytes: 0 %
Lymphocytes Absolute: 1.5 10*3/uL (ref 0.7–3.1)
Lymphs: 39 %
MCH: 32.8 pg (ref 26.6–33.0)
MCHC: 35 g/dL (ref 31.5–35.7)
MCV: 94 fL (ref 79–97)
Monocytes Absolute: 0.6 10*3/uL (ref 0.1–0.9)
Monocytes: 14 %
Neutrophils Absolute: 1.8 10*3/uL (ref 1.4–7.0)
Neutrophils: 44 %
Platelets: 298 10*3/uL (ref 150–450)
RBC: 4.08 x10E6/uL (ref 3.77–5.28)
RDW: 12.5 % (ref 11.7–15.4)
WBC: 4 10*3/uL (ref 3.4–10.8)

## 2019-11-30 LAB — CMP14+EGFR
ALT: 14 IU/L (ref 0–32)
AST: 14 IU/L (ref 0–40)
Albumin/Globulin Ratio: 1.8 (ref 1.2–2.2)
Albumin: 4.5 g/dL (ref 3.9–5.0)
Alkaline Phosphatase: 60 IU/L (ref 48–121)
BUN/Creatinine Ratio: 24 — ABNORMAL HIGH (ref 9–23)
BUN: 19 mg/dL (ref 6–20)
Bilirubin Total: 0.5 mg/dL (ref 0.0–1.2)
CO2: 23 mmol/L (ref 20–29)
Calcium: 9.3 mg/dL (ref 8.7–10.2)
Chloride: 102 mmol/L (ref 96–106)
Creatinine, Ser: 0.78 mg/dL (ref 0.57–1.00)
GFR calc Af Amer: 118 mL/min/{1.73_m2} (ref >59)
GFR calc non Af Amer: 102 mL/min/{1.73_m2} (ref >59)
Globulin, Total: 2.5 g/dL (ref 1.5–4.5)
Glucose: 84 mg/dL (ref 65–99)
Potassium: 4.4 mmol/L (ref 3.5–5.2)
Sodium: 138 mmol/L (ref 134–144)
Total Protein: 7 g/dL (ref 6.0–8.5)

## 2019-11-30 NOTE — Progress Notes (Signed)
CBC and CMP are normal.

## 2019-12-05 NOTE — Progress Notes (Deleted)
Office Visit Note  Patient: Shannon David             Date of Birth: 1991-05-12           MRN: [id number]             PCP: Eunice Blase, PA-C Referring: Eunice Blase, PA-C Visit Date: 12/19/2019 Occupation: @GUAROCC @  Subjective:  No chief complaint on file.   History of Present Illness: Shannon David is a 29 y.o. female ***   Activities of Daily Living:  Patient reports morning stiffness for *** {minute/hour:19697}.   Patient {ACTIONS;DENIES/REPORTS:21021675::"Denies"} nocturnal pain.  Difficulty dressing/grooming: {ACTIONS;DENIES/REPORTS:21021675::"Denies"} Difficulty climbing stairs: {ACTIONS;DENIES/REPORTS:21021675::"Denies"} Difficulty getting out of chair: {ACTIONS;DENIES/REPORTS:21021675::"Denies"} Difficulty using hands for taps, buttons, cutlery, and/or writing: {ACTIONS;DENIES/REPORTS:21021675::"Denies"}  No Rheumatology ROS completed.   PMFS History:  Patient Active Problem List   Diagnosis Date Noted  . History of PCOS 02/21/2017  . High risk medication use 09/21/2016  . Rheumatoid arthritis involving multiple sites with positive rheumatoid factor (HCC) 09/21/2016  . History of hypothyroidism 09/21/2016  . History of depression 09/21/2016    Past Medical History:  Diagnosis Date  . Arthritis     Family History  Problem Relation Age of Onset  . Hypertension Mother   . Clotting disorder Mother   . Cancer Maternal Grandmother        lung   Past Surgical History:  Procedure Laterality Date  . BUNIONECTOMY Right    Social History   Social History Narrative  . Not on file    There is no immunization history on file for this patient.   Objective: Vital Signs: There were no vitals taken for this visit.   Physical Exam   Musculoskeletal Exam: ***  CDAI Exam: CDAI Score: - Patient Global: -; Provider Global: - Swollen: -; Tender: - Joint Exam 12/19/2019   No joint exam has been documented for this visit   There is currently no  information documented on the homunculus. Go to the Rheumatology activity and complete the homunculus joint exam.  Investigation: No additional findings.  Imaging: No results found.  Recent Labs: Lab Results  Component Value Date   WBC 4.0 11/29/2019   HGB 13.4 11/29/2019   PLT 298 11/29/2019   NA 138 11/29/2019   K 4.4 11/29/2019   CL 102 11/29/2019   CO2 23 11/29/2019   GLUCOSE 84 11/29/2019   BUN 19 11/29/2019   CREATININE 0.78 11/29/2019   BILITOT 0.5 11/29/2019   ALKPHOS 60 11/29/2019   AST 14 11/29/2019   ALT 14 11/29/2019   PROT 7.0 11/29/2019   ALBUMIN 4.5 11/29/2019   CALCIUM 9.3 11/29/2019   GFRAA 118 11/29/2019   QFTBGOLD Negative 08/13/2016   QFTBGOLDPLUS NEGATIVE 09/19/2019    Speciality Comments: Prior therapy: Orencia (inadequate response 9/18-9/19), Enbrel (inadequate response 3/15/5/15), Humira (inadequate response 11/17-3/18), and injectable methotrexate (non-compliance)  Procedures:  No procedures performed Allergies: Keflex [cephalexin]   Assessment / Plan:     Visit Diagnoses: No diagnosis found.  Orders: No orders of the defined types were placed in this encounter.  No orders of the defined types were placed in this encounter.   Face-to-face time spent with patient was *** minutes. Greater than 50% of time was spent in counseling and coordination of care.  Follow-Up Instructions: No follow-ups on file.   01-11-1997, PA-C  Note - This record has been created using Dragon software.  Chart creation errors have been sought, but may not always  have been located.  Such creation errors do not reflect on  the standard of medical care.

## 2019-12-06 ENCOUNTER — Other Ambulatory Visit: Payer: Self-pay | Admitting: Rheumatology

## 2019-12-06 DIAGNOSIS — M0579 Rheumatoid arthritis with rheumatoid factor of multiple sites without organ or systems involvement: Secondary | ICD-10-CM

## 2019-12-06 NOTE — Telephone Encounter (Signed)
Last Visit: 09/19/19 Next Visit: 12/19/19 Labs: 09/19/2019 RBC count is borderline low. Absolute eosinophils low but stable. CMP WNL. We will continue to monitor.   Current Dose per office note on 09/19/2019:  methotrexate 1 mL once weekly   Okay to refill per Dr. Corliss Skains

## 2019-12-12 ENCOUNTER — Ambulatory Visit: Payer: BC Managed Care – PPO | Admitting: Family Medicine

## 2019-12-19 ENCOUNTER — Ambulatory Visit: Payer: BC Managed Care – PPO | Admitting: Physician Assistant

## 2019-12-21 NOTE — Progress Notes (Signed)
Office Visit Note  Patient: Shannon David             Date of Birth: 1991-06-05           MRN: [id number]             PCP: Eunice Blase, PA-C Referring: Eunice Blase, PA-C Visit Date: 12/26/2019 Occupation: @GUAROCC @  Subjective:  Right 2nd MCP joint pain   History of Present Illness: Shannon David is a 29 y.o. female with history of seropositive rheumatoid arthritis and osteoarthritis.  She is taking Rinvoq 15 mg 1 tablet by mouth daily and MTX 1.0 ml sq injections once weekly.  She has not missed any doses of rinvoq or MTX recently.  She states last week she had a flare in the right 2nd MCP joint.  She states the pain and inflammation has started to improve.  She denies any overuse activities recently.  She continues to have intermittent pain in the right foot.  She has to wear steel toe shoes up to 13 hours daily at work, which exacerbate her discomfort.   Activities of Daily Living:  Patient reports morning stiffness for 0 none.   Patient Denies nocturnal pain.  Difficulty dressing/grooming: Denies Difficulty climbing stairs: Denies Difficulty getting out of chair: Denies Difficulty using hands for taps, buttons, cutlery, and/or writing: Reports  Review of Systems  Constitutional: Positive for fatigue.  HENT: Negative for mouth sores, mouth dryness and nose dryness.   Eyes: Negative for pain, visual disturbance and dryness.  Respiratory: Negative for cough, hemoptysis, shortness of breath and difficulty breathing.   Cardiovascular: Negative for chest pain, palpitations, hypertension and swelling in legs/feet.  Gastrointestinal: Negative for blood in stool, constipation and diarrhea.  Endocrine: Negative for excessive thirst and increased urination.  Genitourinary: Negative for difficulty urinating and painful urination.  Musculoskeletal: Positive for arthralgias, joint pain and joint swelling. Negative for myalgias, muscle weakness, morning stiffness, muscle tenderness and  myalgias.  Skin: Negative for color change, pallor, rash, hair loss, nodules/bumps, skin tightness, ulcers and sensitivity to sunlight.  Allergic/Immunologic: Negative for susceptible to infections.  Neurological: Negative for dizziness, numbness, headaches and weakness.  Hematological: Negative for bruising/bleeding tendency and swollen glands.  Psychiatric/Behavioral: Negative for depressed mood and sleep disturbance. The patient is not nervous/anxious.     PMFS History:  Patient Active Problem List   Diagnosis Date Noted   History of PCOS 02/21/2017   High risk medication use 09/21/2016   Rheumatoid arthritis involving multiple sites with positive rheumatoid factor (HCC) 09/21/2016   History of hypothyroidism 09/21/2016   History of depression 09/21/2016    Past Medical History:  Diagnosis Date   Arthritis    Rheumatoid arthritis (HCC)     Family History  Problem Relation Age of Onset   Hypertension Mother    Clotting disorder Mother    Cancer Maternal Grandmother        lung   Past Surgical History:  Procedure Laterality Date   BUNIONECTOMY Right    Social History   Social History Narrative   Not on file    There is no immunization history on file for this patient.   Objective: Vital Signs: BP 129/80 (BP Location: Left Arm, Patient Position: Sitting, Cuff Size: Normal)    Pulse 72    Resp 14    Ht 5\' 2"  (1.575 m)    Wt 173 lb 6.4 oz (78.7 kg)    BMI 31.72 kg/m    Physical Exam Vitals and nursing note  reviewed.  Constitutional:      Appearance: She is well-developed.  HENT:     Head: Normocephalic and atraumatic.  Eyes:     Conjunctiva/sclera: Conjunctivae normal.  Pulmonary:     Effort: Pulmonary effort is normal.  Abdominal:     General: Bowel sounds are normal.     Palpations: Abdomen is soft.  Musculoskeletal:     Cervical back: Normal range of motion.  Lymphadenopathy:     Cervical: No cervical adenopathy.  Skin:    General: Skin is  warm and dry.     Capillary Refill: Capillary refill takes less than 2 seconds.  Neurological:     Mental Status: She is alert and oriented to person, place, and time.  Psychiatric:        Behavior: Behavior normal.      Musculoskeletal Exam: C-spine, thoracic spine, and lumbar spine good ROM.  Shoulder joints and elbow joints good ROM.  Limited ROM of both wrist joints.  Ulnar deviation and synovial thickening of MCP joints.  Tenderness and synovitis of bilateral 2nd MCP joints.  Hip joints good ROM with no discomfort.  Knee joints good ROM with no warmth or effusion.  Ankle joints good ROM bilaterally.  Mild warmth of the right ankle joint noted.  Tenderness and synovitis of the right 5th MTP joint.  Hammertoes and overcrowding of toes noted.  Thickening of bilateral 1st and 5th MTP joints.   CDAI Exam: CDAI Score: 4.8  Patient Global: 4 mm; Provider Global: 4 mm Swollen: 3 ; Tender: 3  Joint Exam 12/26/2019      Right  Left  MCP 2  Swollen Tender  Swollen Tender  MTP 5  Swollen Tender        Investigation: No additional findings.  Imaging: No results found.  Recent Labs: Lab Results  Component Value Date   WBC 4.0 11/29/2019   HGB 13.4 11/29/2019   PLT 298 11/29/2019   NA 138 11/29/2019   K 4.4 11/29/2019   CL 102 11/29/2019   CO2 23 11/29/2019   GLUCOSE 84 11/29/2019   BUN 19 11/29/2019   CREATININE 0.78 11/29/2019   BILITOT 0.5 11/29/2019   ALKPHOS 60 11/29/2019   AST 14 11/29/2019   ALT 14 11/29/2019   PROT 7.0 11/29/2019   ALBUMIN 4.5 11/29/2019   CALCIUM 9.3 11/29/2019   GFRAA 118 11/29/2019   QFTBGOLD Negative 08/13/2016   QFTBGOLDPLUS NEGATIVE 09/19/2019    Speciality Comments: Prior therapy: Orencia (inadequate response 9/18-9/19), Enbrel (inadequate response 3/15/5/15), Humira (inadequate response 11/17-3/18), and injectable methotrexate (non-compliance)  Procedures:  No procedures performed Allergies: Keflex [cephalexin]   Assessment / Plan:      Visit Diagnoses: Rheumatoid arthritis involving multiple sites with positive rheumatoid factor (HCC) - Positive RF, positive anti-CCP, 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  Tenderness and inflammation of the right fifth MTP joint noted.  Mild warmth of the right ankle joint noted on exam.  She continues to have intermittent pain and inflammation in both hands and both feet.  Overall she has noticed clinical improvement while taking Rinvoq 15 mg 1 tablet by mouth daily, methotrexate 1 mL subcutaneous injections once weekly, folic acid 2 mg by mouth daily.  She is tolerating these medications without any side effects.  She does not want to make any medication changes at this time.  We will schedule an ultrasound-guided cortisone injection for the right second MCP joint.  She will continue on the current treatment regimen.  She was advised to notify us if she continues to have recurrent flares.  She will follow-up in the office in 3 months.  High risk medication use - Rinvoq 15 mg 1 tablet daily, MTX 1.0 ml sq every 7 days, and folic 1 mg 2 tablets daily.  We discussed the importance of holding Rinvoq and methotrexate if she develops any signs or symptoms of an infection and to resume once the infection has completely cleared.  CBC and CMP were drawn on 11/29/2019.  She will be due to update lab work in September and every 3 months to monitor for drug toxicity.  TB gold was negative on 09/19/2019 and we will continue to monitor yearly.  Primary osteoarthritis of both knees: She has good range of motion of both knee joints on exam.  No warmth or effusion was noted.  She has no difficulty findings steps or getting up from a seated position.  Other medical conditions are listed as follows:   History of hypothyroidism  History of depression  History of PCOS  Trigger finger, right ring finger: Resolved   Orders: No orders of  the defined types were placed in this encounter.  No orders of the defined types were placed in this encounter.    Follow-Up Instructions: Return in about 3 months (around 03/27/2020) for Rheumatoid arthritis.   Ofilia Neas, PA-C  Note - This record has been created using Dragon software.  Chart creation errors have been sought, but may not always  have been located. Such creation errors do not reflect on  the standard of medical care.

## 2019-12-26 ENCOUNTER — Other Ambulatory Visit: Payer: Self-pay

## 2019-12-26 ENCOUNTER — Encounter: Payer: Self-pay | Admitting: Physician Assistant

## 2019-12-26 ENCOUNTER — Ambulatory Visit (INDEPENDENT_AMBULATORY_CARE_PROVIDER_SITE_OTHER): Payer: BC Managed Care – PPO | Admitting: Physician Assistant

## 2019-12-26 VITALS — BP 129/80 | HR 72 | Resp 14 | Ht 62.0 in | Wt 173.4 lb

## 2019-12-26 DIAGNOSIS — Z8639 Personal history of other endocrine, nutritional and metabolic disease: Secondary | ICD-10-CM

## 2019-12-26 DIAGNOSIS — M0579 Rheumatoid arthritis with rheumatoid factor of multiple sites without organ or systems involvement: Secondary | ICD-10-CM

## 2019-12-26 DIAGNOSIS — M170 Bilateral primary osteoarthritis of knee: Secondary | ICD-10-CM

## 2019-12-26 DIAGNOSIS — Z8742 Personal history of other diseases of the female genital tract: Secondary | ICD-10-CM

## 2019-12-26 DIAGNOSIS — M65341 Trigger finger, right ring finger: Secondary | ICD-10-CM

## 2019-12-26 DIAGNOSIS — Z79899 Other long term (current) drug therapy: Secondary | ICD-10-CM

## 2019-12-26 DIAGNOSIS — Z8659 Personal history of other mental and behavioral disorders: Secondary | ICD-10-CM

## 2019-12-26 NOTE — Patient Instructions (Addendum)
  Magnesium malate 250 mg at bedtime for muscle spasms  You can titrate the dose if tolerated     Standing Labs We placed an order today for your standing lab work.   Please have your standing labs drawn in September and every 3 months    If possible, please have your labs drawn 2 weeks prior to your appointment so that the provider can discuss your results at your appointment.  We have open lab daily Monday through Thursday from 8:30-12:30 PM and 1:30-4:30 PM and Friday from 8:30-12:30 PM and 1:30-4:00 PM at the office of Dr. Pollyann Savoy, [facility].   You may experience shorter wait times on Monday and Friday afternoons. The office is located at [address], [place], [place] [address] No appointment is necessary.   Labs are drawn by [facility].  You may receive a bill from [place] for your lab work.  If you wish to have your labs drawn at another location, please call the office 24 hours in advance to send orders.  If you have any questions regarding directions or hours of operation,  please call [phone].   As a reminder, please drink plenty of water prior to coming for your lab work. Thanks!

## 2020-01-01 NOTE — Telephone Encounter (Signed)
I spoke with the patient regarding the picture she sent [place].  She denies any itching pain, or irritation at the MTX injection site.  She has never had an injection site reaction in the past.  We discussed alternating injection site locations.  She can ice the site before and after each injection.  She can also use benadryl or cortisone cream topically as needed if she develops itching in the future.  She was advised to notify us if this becomes a recurrent issue, and we will discuss other options to manage her symptoms. All questions were addressed.

## 2020-01-09 ENCOUNTER — Other Ambulatory Visit: Payer: BC Managed Care – PPO | Admitting: Rheumatology

## 2020-01-18 ENCOUNTER — Encounter: Payer: Self-pay | Admitting: Family Medicine

## 2020-01-18 ENCOUNTER — Other Ambulatory Visit: Payer: Self-pay

## 2020-01-18 ENCOUNTER — Ambulatory Visit (INDEPENDENT_AMBULATORY_CARE_PROVIDER_SITE_OTHER): Payer: BC Managed Care – PPO | Admitting: Family Medicine

## 2020-01-18 DIAGNOSIS — M0579 Rheumatoid arthritis with rheumatoid factor of multiple sites without organ or systems involvement: Secondary | ICD-10-CM | POA: Diagnosis not present

## 2020-01-18 NOTE — Progress Notes (Signed)
[name] [address] [place] [address] Phone: [phone] Subjective:   Shannon David, am serving as a scribe for Dr. Antoine Primas.  This visit occurred during the SARS-CoV-2 public health emergency.  Safety protocols were in place, including screening questions prior to the visit, additional usage of staff PPE, and extensive cleaning of exam room while observing appropriate contact time as indicated for disinfecting solutions.    I'm seeing this patient by the request  of:  O'Buch, Greta, PA-C  CC: Evaluation for hypermobility syndrome  TKW:[id number]  Shannon David is a 29 y.o. female coming in to talk about her family history of the Ehlers-Danlos. Mother wanted patient to see if she was a carrier, Rheumatologist referred patient to here.  Patient states that overall she has not had any sick true significant dislocations of any joints.  Has never had any dislocation of the lens.  No cardiac difficulties.  Past medical history is significant for rheumatoid arthritis and is having outstanding care by rheumatology.  Patient states that she does does have significant amount of discomfort from time to time.  Sometimes feels like her joints ache more than what they should.  Patient's mother is more concerned than anything else.       Past Medical History:  Diagnosis Date  . Arthritis   . Rheumatoid arthritis [facility])    Past Surgical History:  Procedure Laterality Date  . BUNIONECTOMY Right    Social History   Socioeconomic History  . Marital status: Married    Spouse name: Not on file  . Number of children: Not on file  . Years of education: Not on file  . Highest education level: Not on file  Occupational History  . Not on file  Tobacco Use  . Smoking status: Never Smoker  . Smokeless tobacco: Never Used  Vaping Use  . Vaping Use: Never used  Substance and Sexual Activity  . Alcohol use: No  . Drug use: No  . Sexual  activity: Not on file  Other Topics Concern  . Not on file  Social History Narrative  . Not on file   Social Determinants of Health   Financial Resource Strain:   . Difficulty of Paying Living Expenses:   Food Insecurity:   . Worried About Programme researcher, broadcasting/film/video in the Last Year:   . Barista in the Last Year:   Transportation Needs:   . Freight forwarder (Medical):   Marland Kitchen Lack of Transportation (Non-Medical):   Physical Activity:   . Days of Exercise per Week:   . Minutes of Exercise per Session:   Stress:   . Feeling of Stress :   Social Connections:   . Frequency of Communication with Friends and Family:   . Frequency of Social Gatherings with Friends and Family:   . Attends Religious Services:   . Active Member of Clubs or Organizations:   . Attends Banker Meetings:   Marland Kitchen Marital Status:    Allergies  Allergen Reactions  . Keflex [Cephalexin] Hives   Family History  Problem Relation Age of Onset  . Hypertension Mother   . Clotting disorder Mother   . Cancer Maternal Grandmother        lung    Current Outpatient Medications (Endocrine & Metabolic):  .  SRONYX 0.1-20 MG-MCG tablet, Take 1 tablet by mouth daily. Marland Kitchen  levonorgestrel-ethinyl estradiol (ALESSE) 0.1-20 MG-MCG tablet, Take 1 tablet by mouth daily. (Patient  not taking: Reported on 12/26/2019) .  Norgestimate-Ethinyl Estradiol Triphasic 0.18/0.215/0.25 MG-25 MCG tab, Take by mouth. (Patient not taking: Reported on 12/26/2019) .  predniSONE (DELTASONE) 5 MG tablet, Take 4 tablets by mouth once daily x4 days, 3 tablets by mouth daily x4 days, 2 tablets by mouth daily x4 days, 1 tablet by mouth daily x4 days. (Patient not taking: Reported on 12/26/2019) .  TRI-ESTARYLLA 0.18/0.215/0.25 MG-35 MCG tablet, Take 1 tablet by mouth daily. (Patient not taking: Reported on 12/26/2019)    Current Outpatient Medications (Analgesics):  .  acetaminophen (TYLENOL) 500 MG tablet, Take 1,000 mg by mouth every 6  (six) hours as needed for mild pain. Marland Kitchen  RINVOQ 15 MG TB24, TAKE 1 TABLET BY MOUTH 1 TIME A DAY.  Current Outpatient Medications (Hematological):  .  folic acid (FOLVITE) 1 MG tablet, Take 2 tablets (2 mg total) by mouth daily.  Current Outpatient Medications (Other):  .  methotrexate 50 MG/2ML injection, INJECT 1 ML (25 MG TOTAL) INTO THE SKIN ONCE A WEEK. .  ondansetron (ZOFRAN) 4 MG tablet, TAKE 1 TABLET BY MOUTH AS NEEDED. .  TUBERCULIN SYR 1CC/27GX1/2" (B-D TB SYRINGE 1CC/27GX1/2") 27G X 1/2" 1 ML MISC, 12 Syringes by Does not apply route once a week. .  diclofenac sodium (VOLTAREN) 1 % GEL, Apply 3 grams to 3 large joints up to 3 times daily (Patient not taking: Reported on 12/26/2019)   Reviewed prior external information including notes and imaging from  primary care provider As well as notes that were available from care everywhere and other healthcare systems.  Past medical history, social, surgical and family history all reviewed in electronic medical record.  No pertanent information unless stated regarding to the chief complaint.   Review of Systems:  No headache, visual changes, nausea, vomiting, diarrhea, constipation, dizziness, abdominal pain, skin rash, fevers, chills, night sweats, weight loss, swollen lymph nodes,  chest pain, shortness of breath, mood changes. POSITIVE muscle aches, body aches, joint swelling  Objective  Blood pressure 120/70, pulse 83, height 5\' 2"  (1.575 m), weight 175 lb (79.4 kg), SpO2 99 %.   General: No apparent distress alert and oriented x3 mood and affect normal, dressed appropriately.  HEENT: Pupils equal, extraocular movements intact  Respiratory: Patient's speak in full sentences and does not appear short of breath  Cardiovascular: No lower extremity edema, non tender, no erythema  Patient though extremities on musculoskeletal exam shows the patient does have contractures noted of the hands already with significant synovitis of the second  and third metacarpals.  Patient also has significant breakdown of the transverse arch of the foot with synovitis also noted.  On range of motion testing patient does not have any increasing in range of motion of the upper extremities.  Patient on lower extremities does have some very mild increase in laxity of the patellofemoral as well as extension of the knee goes to -5 degrees.   Impression and Recommendations:     The above documentation has been reviewed and is accurate and complete , DO       Note: This dictation was prepared with Dragon dictation along with smaller phrase technology. Any transcriptional errors that result from this process are unintentional.

## 2020-01-18 NOTE — Assessment & Plan Note (Signed)
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  This seems to be fairly severe.  I do not see true signs of hypermobility.  She has some mild increase in extension of the lower extremities but upper extremities Beighton score of 5-6.  We discussed that we could consider the possibility of referral to genetics for further testing but I do not think it would change medical management.  Patient's mother has significant Ehlers-Danlos syndrome but no multiorgan failure and seems to be only secondary to the musculoskeletal.  We discussed with patient about different exercises and potentially avoiding certain range of motion and certainly significant heavy lifting but otherwise continuing to stay active will be beneficial.  If patient has any other significant questions I do think that we can make some changes if necessary.  Patient is in agreement with the plan and will follow up with [place] if she feels like anything else is necessary at this time

## 2020-01-18 NOTE — Patient Instructions (Addendum)
Good to see you  I do not believe you have a connective tissue disorder We can refer to genetics Vitamin D 5,000 IUs daily Tumeric 500 mg daily watch for reflux Continue other meds Exercise 3 times a week Spenco orthotics See me again in 6-8 weeks and write me with any questions

## 2020-02-07 DIAGNOSIS — M2041 Other hammer toe(s) (acquired), right foot: Secondary | ICD-10-CM | POA: Diagnosis not present

## 2020-02-07 DIAGNOSIS — M7752 Other enthesopathy of left foot: Secondary | ICD-10-CM | POA: Diagnosis not present

## 2020-02-07 DIAGNOSIS — G5761 Lesion of plantar nerve, right lower limb: Secondary | ICD-10-CM | POA: Diagnosis not present

## 2020-02-07 DIAGNOSIS — M2042 Other hammer toe(s) (acquired), left foot: Secondary | ICD-10-CM | POA: Diagnosis not present

## 2020-02-07 DIAGNOSIS — M7751 Other enthesopathy of right foot: Secondary | ICD-10-CM | POA: Diagnosis not present

## 2020-02-10 DIAGNOSIS — Z1151 Encounter for screening for human papillomavirus (HPV): Secondary | ICD-10-CM | POA: Diagnosis not present

## 2020-02-10 DIAGNOSIS — Z01419 Encounter for gynecological examination (general) (routine) without abnormal findings: Secondary | ICD-10-CM | POA: Diagnosis not present

## 2020-02-11 DIAGNOSIS — Z01419 Encounter for gynecological examination (general) (routine) without abnormal findings: Secondary | ICD-10-CM | POA: Diagnosis not present

## 2020-02-11 DIAGNOSIS — Z1151 Encounter for screening for human papillomavirus (HPV): Secondary | ICD-10-CM | POA: Diagnosis not present

## 2020-02-27 ENCOUNTER — Telehealth: Payer: Self-pay | Admitting: Pharmacy Technician

## 2020-02-27 NOTE — Telephone Encounter (Signed)
Submitted a Prior Authorization request to [facility] for [facility] via Cover My Meds. Will update once we receive a response.   Key: [id number] - PA Case ID: [id number]

## 2020-03-04 NOTE — Telephone Encounter (Signed)
Received notification from [facility] regarding a prior authorization for [facility]. Authorization has been APPROVED from 03/01/20 to 03/01/21.   Authorization # [id number]

## 2020-03-05 ENCOUNTER — Other Ambulatory Visit: Payer: Self-pay | Admitting: Rheumatology

## 2020-03-05 DIAGNOSIS — M0579 Rheumatoid arthritis with rheumatoid factor of multiple sites without organ or systems involvement: Secondary | ICD-10-CM

## 2020-03-05 NOTE — Telephone Encounter (Signed)
Last Visit: 12/26/2019 Next Visit: 03/27/2020 Labs: 11/29/2019 CBC and CMP are normal.  Current Dose per office note 12/26/2019: MTX 1.0 ml sq every 7 days, DX: Rheumatoid arthritis involving multiple sites with positive rheumatoid factor   Patient advised she is due to update labs.   Okay to refill 30 day supply MTX?

## 2020-03-06 ENCOUNTER — Other Ambulatory Visit: Payer: Self-pay | Admitting: *Deleted

## 2020-03-06 ENCOUNTER — Telehealth: Payer: Self-pay | Admitting: Rheumatology

## 2020-03-06 DIAGNOSIS — Z79899 Other long term (current) drug therapy: Secondary | ICD-10-CM | POA: Diagnosis not present

## 2020-03-06 NOTE — Telephone Encounter (Signed)
Lab Orders released.  

## 2020-03-06 NOTE — Telephone Encounter (Signed)
Patient called requesting her labwork orders be sent to [facility] in [place].  Patient states she plans to go in the next 15 min.

## 2020-03-07 LAB — CMP14+EGFR
ALT: 11 IU/L (ref 0–32)
AST: 10 IU/L (ref 0–40)
Albumin/Globulin Ratio: 1.9 (ref 1.2–2.2)
Albumin: 4.7 g/dL (ref 3.8–4.8)
Alkaline Phosphatase: 54 IU/L (ref 48–121)
BUN/Creatinine Ratio: 18 (ref 9–23)
BUN: 14 mg/dL (ref 6–20)
Bilirubin Total: 0.5 mg/dL (ref 0.0–1.2)
CO2: 22 mmol/L (ref 20–29)
Calcium: 9.6 mg/dL (ref 8.7–10.2)
Chloride: 101 mmol/L (ref 96–106)
Creatinine, Ser: 0.76 mg/dL (ref 0.57–1.00)
GFR calc Af Amer: 121 mL/min/{1.73_m2} (ref >59)
GFR calc non Af Amer: 105 mL/min/{1.73_m2} (ref >59)
Globulin, Total: 2.5 g/dL (ref 1.5–4.5)
Glucose: 82 mg/dL (ref 65–99)
Potassium: 4.7 mmol/L (ref 3.5–5.2)
Sodium: 137 mmol/L (ref 134–144)
Total Protein: 7.2 g/dL (ref 6.0–8.5)

## 2020-03-07 LAB — LIPID PANEL
Chol/HDL Ratio: 2.5 ratio (ref 0.0–4.4)
Cholesterol, Total: 161 mg/dL (ref 100–199)
HDL: 64 mg/dL (ref >39)
LDL Chol Calc (NIH): 86 mg/dL (ref 0–99)
Triglycerides: 54 mg/dL (ref 0–149)
VLDL Cholesterol Cal: 11 mg/dL (ref 5–40)

## 2020-03-07 LAB — CBC WITH DIFFERENTIAL/PLATELET
Basophils Absolute: 0 10*3/uL (ref 0.0–0.2)
Basos: 1 %
EOS (ABSOLUTE): 0 10*3/uL (ref 0.0–0.4)
Eos: 0 %
Hematocrit: 37.2 % (ref 34.0–46.6)
Hemoglobin: 13.2 g/dL (ref 11.1–15.9)
Immature Grans (Abs): 0 10*3/uL (ref 0.0–0.1)
Immature Granulocytes: 0 %
Lymphocytes Absolute: 1.4 10*3/uL (ref 0.7–3.1)
Lymphs: 33 %
MCH: 33.1 pg — ABNORMAL HIGH (ref 26.6–33.0)
MCHC: 35.5 g/dL (ref 31.5–35.7)
MCV: 93 fL (ref 79–97)
Monocytes Absolute: 0.5 10*3/uL (ref 0.1–0.9)
Monocytes: 12 %
Neutrophils Absolute: 2.2 10*3/uL (ref 1.4–7.0)
Neutrophils: 54 %
Platelets: 289 10*3/uL (ref 150–450)
RBC: 3.99 x10E6/uL (ref 3.77–5.28)
RDW: 12.2 % (ref 11.7–15.4)
WBC: 4.1 10*3/uL (ref 3.4–10.8)

## 2020-03-07 NOTE — Progress Notes (Signed)
CBC and CMP are stable.

## 2020-03-07 NOTE — Progress Notes (Signed)
Lipid panel WNL

## 2020-03-10 DIAGNOSIS — R87810 Cervical high risk human papillomavirus (HPV) DNA test positive: Secondary | ICD-10-CM | POA: Diagnosis not present

## 2020-03-10 DIAGNOSIS — R87610 Atypical squamous cells of undetermined significance on cytologic smear of cervix (ASC-US): Secondary | ICD-10-CM | POA: Diagnosis not present

## 2020-03-10 DIAGNOSIS — N871 Moderate cervical dysplasia: Secondary | ICD-10-CM | POA: Diagnosis not present

## 2020-03-10 DIAGNOSIS — N870 Mild cervical dysplasia: Secondary | ICD-10-CM | POA: Diagnosis not present

## 2020-03-13 DIAGNOSIS — R0982 Postnasal drip: Secondary | ICD-10-CM | POA: Diagnosis not present

## 2020-03-13 DIAGNOSIS — Z20822 Contact with and (suspected) exposure to covid-19: Secondary | ICD-10-CM | POA: Diagnosis not present

## 2020-03-14 ENCOUNTER — Other Ambulatory Visit: Payer: Self-pay | Admitting: Rheumatology

## 2020-03-14 NOTE — Telephone Encounter (Signed)
Last Visit: 12/26/2019 Next Visit: 03/27/2020  Current dose per office note on 12/26/2019: folic 1 mg 2 tablets daily.   Okay to refill folic acid?

## 2020-03-17 NOTE — Progress Notes (Deleted)
Office Visit Note  Patient: Shannon David             Date of Birth: Jun 14, 1991           MRN: [id number]             PCP: Eunice Blase, PA-C Referring: Eunice Blase, PA-C Visit Date: 03/27/2020 Occupation: @GUAROCC @  Subjective:  No chief complaint on file.   History of Present Illness: Shannon David is a 29 y.o. female ***   Activities of Daily Living:  Patient reports morning stiffness for *** {minute/hour:19697}.   Patient {ACTIONS;DENIES/REPORTS:21021675::"Denies"} nocturnal pain.  Difficulty dressing/grooming: {ACTIONS;DENIES/REPORTS:21021675::"Denies"} Difficulty climbing stairs: {ACTIONS;DENIES/REPORTS:21021675::"Denies"} Difficulty getting out of chair: {ACTIONS;DENIES/REPORTS:21021675::"Denies"} Difficulty using hands for taps, buttons, cutlery, and/or writing: {ACTIONS;DENIES/REPORTS:21021675::"Denies"}  No Rheumatology ROS completed.   PMFS History:  Patient Active Problem List   Diagnosis Date Noted  . History of PCOS 02/21/2017  . High risk medication use 09/21/2016  . Rheumatoid arthritis involving multiple sites with positive rheumatoid factor (HCC) 09/21/2016  . History of hypothyroidism 09/21/2016  . History of depression 09/21/2016    Past Medical History:  Diagnosis Date  . Arthritis   . Rheumatoid arthritis (HCC)     Family History  Problem Relation Age of Onset  . Hypertension Mother   . Clotting disorder Mother   . Cancer Maternal Grandmother        lung   Past Surgical History:  Procedure Laterality Date  . BUNIONECTOMY Right    Social History   Social History Narrative  . Not on file    There is no immunization history on file for this patient.   Objective: Vital Signs: There were no vitals taken for this visit.   Physical Exam   Musculoskeletal Exam: ***  CDAI Exam: CDAI Score: -- Patient Global: --; Provider Global: -- Swollen: --; Tender: -- Joint Exam 03/27/2020   No joint exam has been documented for this  visit   There is currently no information documented on the homunculus. Go to the Rheumatology activity and complete the homunculus joint exam.  Investigation: No additional findings.  Imaging: No results found.  Recent Labs: Lab Results  Component Value Date   WBC 4.1 03/06/2020   HGB 13.2 03/06/2020   PLT 289 03/06/2020   NA 137 03/06/2020   K 4.7 03/06/2020   CL 101 03/06/2020   CO2 22 03/06/2020   GLUCOSE 82 03/06/2020   BUN 14 03/06/2020   CREATININE 0.76 03/06/2020   BILITOT 0.5 03/06/2020   ALKPHOS 54 03/06/2020   AST 10 03/06/2020   ALT 11 03/06/2020   PROT 7.2 03/06/2020   ALBUMIN 4.7 03/06/2020   CALCIUM 9.6 03/06/2020   GFRAA 121 03/06/2020   QFTBGOLD Negative 08/13/2016   QFTBGOLDPLUS NEGATIVE 09/19/2019    Speciality Comments: Prior therapy: Orencia (inadequate response 9/18-9/19), Enbrel (inadequate response 3/15/5/15), Humira (inadequate response 11/17-3/18), and injectable methotrexate (non-compliance)  Procedures:  No procedures performed Allergies: Keflex [cephalexin]   Assessment / Plan:     Visit Diagnoses: No diagnosis found.  Orders: No orders of the defined types were placed in this encounter.  No orders of the defined types were placed in this encounter.   Face-to-face time spent with patient was *** minutes. Greater than 50% of time was spent in counseling and coordination of care.  Follow-Up Instructions: No follow-ups on file.   01-11-1997, CMA  Note - This record has been created using Ellen Henri.  Chart creation errors have been sought, but may  not always  have been located. Such creation errors do not reflect on  the standard of medical care.

## 2020-03-24 NOTE — Progress Notes (Signed)
Office Visit Note  Patient: Shannon David             Date of Birth: 1991/04/26           MRN: [id number]             PCP: Janine Limbo, PA-C Referring: Janine Limbo, PA-C Visit Date: 04/07/2020 Occupation: '@GUAROCC' @  Subjective:  Pain in both hands   History of Present Illness: Shannon David is a 29 y.o. female with history of seropositive rheumatoid arthritis and osteoarthritis.  She is prescribed Rinvoq 15 mg 1 tablet by mouth daily, MTX 1.0 ml sq injections once weekly, and folic acid 2 mg po daily.  According to the patient she missed about 1 week (both due to issues with her co-pay card and receiving the refill.  She states she recently underwent a LEEP performed by her gynecologist and held methotrexate for 1 week.  She has resumed about medications about 1 week ago. She states she has been experiencing increased pain and stiffness in both hands due to the gap in therapies.  She is also experiencing occasional discomfort in her right shoulder especially when lying on her right side at night.  She has chronic pain in both feet and is continues to see a podiatrist [facility] and has been getting injections every 3 months.    Activities of Daily Living:  Patient reports morning stiffness for 4 hours.   Patient Denies nocturnal pain.  Difficulty dressing/grooming: Denies Difficulty climbing stairs: Denies Difficulty getting out of chair: Denies Difficulty using hands for taps, buttons, cutlery, and/or writing: Reports  Review of Systems  Constitutional: Negative for fatigue.  HENT: Negative for mouth sores, mouth dryness and nose dryness.   Eyes: Negative for pain, visual disturbance and dryness.  Respiratory: Negative for cough, hemoptysis, shortness of breath and difficulty breathing.   Cardiovascular: Negative for chest pain, palpitations, hypertension and swelling in legs/feet.  Gastrointestinal: Negative for blood in stool, constipation and diarrhea.    Endocrine: Negative for increased urination.  Genitourinary: Negative for painful urination.  Musculoskeletal: Positive for arthralgias, joint pain, joint swelling and morning stiffness. Negative for myalgias, muscle weakness, muscle tenderness and myalgias.  Skin: Negative for color change, pallor, rash, hair loss, nodules/bumps, skin tightness, ulcers and sensitivity to sunlight.  Allergic/Immunologic: Negative for susceptible to infections.  Neurological: Negative for dizziness, numbness, headaches and weakness.  Hematological: Negative for swollen glands.  Psychiatric/Behavioral: Positive for sleep disturbance. Negative for depressed mood. The patient is not nervous/anxious.     PMFS History:  Patient Active Problem List   Diagnosis Date Noted  . History of PCOS 02/21/2017  . High risk medication use 09/21/2016  . Rheumatoid arthritis involving multiple sites with positive rheumatoid factor ([place]) 09/21/2016  . History of hypothyroidism 09/21/2016  . History of depression 09/21/2016    Past Medical History:  Diagnosis Date  . Arthritis   . Rheumatoid arthritis ([place])     Family History  Problem Relation Age of Onset  . Hypertension Mother   . Clotting disorder Mother   . Cancer Maternal Grandmother        lung   Past Surgical History:  Procedure Laterality Date  . BUNIONECTOMY Right    Social History   Social History Narrative  . Not on file    There is no immunization history on file for this patient.   Objective: Vital Signs: BP 138/88 (BP Location: Left Arm, Patient Position: Sitting, Cuff Size: Small)   Pulse 83  Ht '5\' 2"'  (1.575 m)   Wt 183 lb (83 kg)   LMP 03/07/2020 (Within Days)   BMI 33.47 kg/m    Physical Exam Vitals and nursing note reviewed.  Constitutional:      Appearance: She is well-developed.  HENT:     Head: Normocephalic and atraumatic.  Eyes:     Conjunctiva/sclera: Conjunctivae normal.  Pulmonary:     Effort: Pulmonary effort is  normal.  Abdominal:     Palpations: Abdomen is soft.  Musculoskeletal:     Cervical back: Normal range of motion.  Skin:    General: Skin is warm and dry.     Capillary Refill: Capillary refill takes less than 2 seconds.  Neurological:     Mental Status: She is alert and oriented to person, place, and time.  Psychiatric:        Behavior: Behavior normal.      Musculoskeletal Exam:  C-spine, thoracic spine, and lumbar spine good ROM.  No midline spinal tenderness. No SI joint tenderness.  Shoulder joints and elbows good ROM with no tenderness or inflammation.  Limited ROM of both wrist joints but no tenderness or inflammation noted.  Synovitis and tenderness of the right 2nd and 3rd MCPs and left 1st and 3rd MCP joints.  Tenderness of the left 2nd MCP joint.  Hip joints good ROM with no discomfort.  Knee joints good ROM with no warmth or effusion.  Ankle joints good ROM with no tenderness or inflammation.   CDAI Exam: CDAI Score: 10  Patient Global: 5 mm; Provider Global: 5 mm Swollen: 4 ; Tender: 5  Joint Exam 04/07/2020      Right  Left  MCP 2  Swollen Tender  Swollen Tender  MCP 3  Swollen Tender   Tender  MCP 4     Swollen Tender     Investigation: No additional findings.  Imaging: No results found.  Recent Labs: Lab Results  Component Value Date   WBC 4.1 03/06/2020   HGB 13.2 03/06/2020   PLT 289 03/06/2020   NA 137 03/06/2020   K 4.7 03/06/2020   CL 101 03/06/2020   CO2 22 03/06/2020   GLUCOSE 82 03/06/2020   BUN 14 03/06/2020   CREATININE 0.76 03/06/2020   BILITOT 0.5 03/06/2020   ALKPHOS 54 03/06/2020   AST 10 03/06/2020   ALT 11 03/06/2020   PROT 7.2 03/06/2020   ALBUMIN 4.7 03/06/2020   CALCIUM 9.6 03/06/2020   GFRAA 121 03/06/2020   QFTBGOLD Negative 08/13/2016   QFTBGOLDPLUS NEGATIVE 09/19/2019    Speciality Comments: Prior therapy: Orencia (inadequate response 9/18-9/19), Enbrel (inadequate response 3/15/5/15), Humira (inadequate response  11/17-3/18), and injectable methotrexate (non-compliance)  Procedures:  No procedures performed Allergies: Keflex [cephalexin]   Assessment / Plan:     Visit Diagnoses: Rheumatoid arthritis involving multiple sites with positive rheumatoid factor (HCC) - Positive RF, positive anti-CCP, erosive disease with contractures and ulnar deviation: She has tenderness and synovitis of the right second and third MCP joints and left first and third MCP joints on exam.  Ulnar deviation and thickening of MCP joints noted on exam.  She recently missed about 1 week of Rinvoq due to issues with her co-pay card and receiving her refill.  She states that she held methotrexate 1 week ago due to undergoing a LEEP. She has been experiencing increased pain and stiffness in her hands due to the gap of therapy. She has since resumed both medications. Overall she feels as though the  combination has been effective at managing her rheumatoid arthritis.  She has not been experiencing frequent flares recently.  She will continue taking Rinvoq 15 mg 1 tablet by mouth daily, methotrexate 1.0 mL sq injections once weekly, and folic acid 2 mg by mouth daily.  She does not need any refills at this time.  She was advised to notify us if her hand pain and swelling does not improve or starts to worsen.  She declined a prednisone taper today.  She will follow-up in the office in 3 months.  High risk medication use - Rinvoq 15 mg 1 tablet by mouth daily, MTX 1.0 ml sq injections every 7 days, and folic 1 mg 2 tablets daily.  - Plan: CMP14+EGFR CBC and CMP WNL on 03/06/20. She will be due to update CBC and CMP in December and every 3 months. Standing orders for CBC in place.  Standing orders for CMP placed today.  Lipid panel WNL on 03/06/20. TB gold negative on 09/19/19 and will continue to be monitored yearly.    She has not received the covid-19 vaccines, so we discussed that ACR recommends proceeding with these vaccines.  She was advised to hold  Rinvoq and MTX 1 week after each dose.  She was also advised to hold tylenol and NSAIDs 24 hours prior to the doses. She was advised to notify us if she develops the covid-19 infection in order to receive the antibody infusion.  She was encouraged to continue to wear a mask and social distance.  She voiced understanding.  She was advised to hold rinvoq and MTX if she develops signs or symptoms of an infection to resume once the infection has cleared.   Primary osteoarthritis of both knees: She has good ROM of both knee joints with no discomfort.  No warmth or effusion of knee joints noted.   Other medical conditions are listed as follows:   History of hypothyroidism  History of PCOS  History of depression  Trigger finger, right ring finger - Resolved   Orders: Orders Placed This Encounter  Procedures  . CMP14+EGFR   No orders of the defined types were placed in this encounter.    Follow-Up Instructions: Return in about 3 months (around 07/08/2020) for Rheumatoid arthritis, Osteoarthritis.   Ofilia Neas, PA-C  Note - This record has been created using Dragon software.  Chart creation errors have been sought, but may not always  have been located. Such creation errors do not reflect on  the standard of medical care.

## 2020-03-25 DIAGNOSIS — R87810 Cervical high risk human papillomavirus (HPV) DNA test positive: Secondary | ICD-10-CM | POA: Diagnosis not present

## 2020-03-25 DIAGNOSIS — R87610 Atypical squamous cells of undetermined significance on cytologic smear of cervix (ASC-US): Secondary | ICD-10-CM | POA: Diagnosis not present

## 2020-03-25 DIAGNOSIS — Z3202 Encounter for pregnancy test, result negative: Secondary | ICD-10-CM | POA: Diagnosis not present

## 2020-03-25 DIAGNOSIS — N871 Moderate cervical dysplasia: Secondary | ICD-10-CM | POA: Diagnosis not present

## 2020-03-27 ENCOUNTER — Ambulatory Visit: Payer: BC Managed Care – PPO | Admitting: Rheumatology

## 2020-04-03 ENCOUNTER — Other Ambulatory Visit: Payer: Self-pay | Admitting: Physician Assistant

## 2020-04-03 DIAGNOSIS — M0579 Rheumatoid arthritis with rheumatoid factor of multiple sites without organ or systems involvement: Secondary | ICD-10-CM

## 2020-04-03 NOTE — Telephone Encounter (Signed)
Last Visit: 12/26/2019 Next Visit: 03/27/2020 Labs: 03/06/2020 CBC and CMP are stable  Current Dose per office note 12/26/2019: MTX 1.0 ml sq every 7 days, DX: Rheumatoid arthritis involving multiple sites with positive rheumatoid factor   Okay to refill per Dr. Corliss Skains

## 2020-04-07 ENCOUNTER — Ambulatory Visit (INDEPENDENT_AMBULATORY_CARE_PROVIDER_SITE_OTHER): Payer: BC Managed Care – PPO | Admitting: Physician Assistant

## 2020-04-07 ENCOUNTER — Other Ambulatory Visit: Payer: Self-pay

## 2020-04-07 ENCOUNTER — Encounter: Payer: Self-pay | Admitting: Physician Assistant

## 2020-04-07 VITALS — BP 138/88 | HR 83 | Ht 62.0 in | Wt 183.0 lb

## 2020-04-07 DIAGNOSIS — Z8639 Personal history of other endocrine, nutritional and metabolic disease: Secondary | ICD-10-CM

## 2020-04-07 DIAGNOSIS — M65341 Trigger finger, right ring finger: Secondary | ICD-10-CM

## 2020-04-07 DIAGNOSIS — M0579 Rheumatoid arthritis with rheumatoid factor of multiple sites without organ or systems involvement: Secondary | ICD-10-CM | POA: Diagnosis not present

## 2020-04-07 DIAGNOSIS — M170 Bilateral primary osteoarthritis of knee: Secondary | ICD-10-CM | POA: Diagnosis not present

## 2020-04-07 DIAGNOSIS — Z79899 Other long term (current) drug therapy: Secondary | ICD-10-CM | POA: Diagnosis not present

## 2020-04-07 DIAGNOSIS — Z8742 Personal history of other diseases of the female genital tract: Secondary | ICD-10-CM

## 2020-04-07 DIAGNOSIS — Z8659 Personal history of other mental and behavioral disorders: Secondary | ICD-10-CM

## 2020-04-07 NOTE — Patient Instructions (Addendum)
Standing Labs We placed an order today for your standing lab work.   Please have your standing labs drawn in December and every 3 months   If possible, please have your labs drawn 2 weeks prior to your appointment so that the provider can discuss your results at your appointment.  We have open lab daily Monday through Thursday from 8:30-12:30 PM and 1:30-4:30 PM and Friday from 8:30-12:30 PM and 1:30-4:00 PM at the office of Dr. Shaili Deveshwar, [facility].   Please be advised, patients with office appointments requiring lab work will take precedents over walk-in lab work.  If possible, please come for your lab work on Monday and Friday afternoons, as you may experience shorter wait times. The office is located at [address], [address], [place], [place] [address] No appointment is necessary.   Labs are drawn by [facility]. Please bring your co-pay at the time of your lab draw.  You may receive a bill from [facility] for your lab work.  If you wish to have your labs drawn at another location, please call the office 24 hours in advance to send orders.  If you have any questions regarding directions or hours of operation,  please call [phone].   As a reminder, please drink plenty of water prior to coming for your lab work. Thanks!   COVID-19 vaccine recommendations:   COVID-19 vaccine is recommended for everyone (unless you are allergic to a vaccine component), even if you are on a medication that suppresses your immune system.   If you are on Methotrexate, Cellcept (mycophenolate), Rinvoq, Xeljanz, and Olumiant- hold the medication for 1 week after each vaccine. Hold Methotrexate for 2 weeks after the single dose COVID-19 vaccine.   If you are on Orencia subcutaneous injection - hold medication one week prior to and one week after the first COVID-19 vaccine dose (only).   If you are on Orencia IV infusions- time vaccination administration so that the first COVID-19  vaccination will occur four weeks after the infusion and postpone the subsequent infusion by one week.   If you are on Cyclophosphamide or Rituxan infusions please contact your doctor prior to receiving the COVID-19 vaccine.   Do not take Tylenol or any anti-inflammatory medications (NSAIDs) 24 hours prior to the COVID-19 vaccination.   There is no direct evidence about the efficacy of the COVID-19 vaccine in individuals who are on medications that suppress the immune system.   Even if you are fully vaccinated, and you are on any medications that suppress your immune system, please continue to wear a mask, maintain at least six feet social distance and practice hand hygiene.   If you develop a COVID-19 infection, please contact your PCP or our office to determine if you need antibody infusion.  The booster vaccine is now available for immunocompromised patients. It is advised that if you had Pfizer vaccine you should get Pfizer booster.  If you had a Moderna vaccine then you should get a Moderna booster. Johnson and Johnson does not have a booster vaccine at this time.  Please see the following web sites for updated information.   https://[email or web address]/[email or web address]/0/Files/COVID-19-Vaccination-Patient-Resources.pdf  https://[email or web address].org/About-Us/Newsroom/Press-Releases/ID/1159    

## 2020-04-11 DIAGNOSIS — J069 Acute upper respiratory infection, unspecified: Secondary | ICD-10-CM | POA: Diagnosis not present

## 2020-04-11 DIAGNOSIS — Z20828 Contact with and (suspected) exposure to other viral communicable diseases: Secondary | ICD-10-CM | POA: Diagnosis not present

## 2020-04-11 DIAGNOSIS — J029 Acute pharyngitis, unspecified: Secondary | ICD-10-CM | POA: Diagnosis not present

## 2020-04-14 DIAGNOSIS — Z20822 Contact with and (suspected) exposure to covid-19: Secondary | ICD-10-CM | POA: Diagnosis not present

## 2020-04-14 DIAGNOSIS — B349 Viral infection, unspecified: Secondary | ICD-10-CM | POA: Diagnosis not present

## 2020-04-29 DIAGNOSIS — G5761 Lesion of plantar nerve, right lower limb: Secondary | ICD-10-CM | POA: Diagnosis not present

## 2020-04-29 DIAGNOSIS — M7751 Other enthesopathy of right foot: Secondary | ICD-10-CM | POA: Diagnosis not present

## 2020-06-03 ENCOUNTER — Other Ambulatory Visit: Payer: Self-pay | Admitting: *Deleted

## 2020-06-03 ENCOUNTER — Telehealth: Payer: Self-pay

## 2020-06-03 DIAGNOSIS — Z79899 Other long term (current) drug therapy: Secondary | ICD-10-CM

## 2020-06-03 NOTE — Telephone Encounter (Signed)
Patient is at [facility] and they need labwork orders faxed to:  [phone]

## 2020-06-03 NOTE — Telephone Encounter (Signed)
Patient called requesting her labwork orders be sent to [facility] in [place].  Patient will be going this morning.

## 2020-06-03 NOTE — Telephone Encounter (Signed)
Lab Orders faxed

## 2020-06-03 NOTE — Telephone Encounter (Signed)
Lab Orders released.  

## 2020-06-04 LAB — CMP14+EGFR
ALT: 9 IU/L (ref 0–32)
AST: 13 IU/L (ref 0–40)
Albumin/Globulin Ratio: 1.7 (ref 1.2–2.2)
Albumin: 4.5 g/dL (ref 3.8–4.8)
Alkaline Phosphatase: 59 IU/L (ref 44–121)
BUN/Creatinine Ratio: 18 (ref 9–23)
BUN: 12 mg/dL (ref 6–20)
Bilirubin Total: 0.4 mg/dL (ref 0.0–1.2)
CO2: 20 mmol/L (ref 20–29)
Calcium: 9.7 mg/dL (ref 8.7–10.2)
Chloride: 102 mmol/L (ref 96–106)
Creatinine, Ser: 0.67 mg/dL (ref 0.57–1.00)
GFR calc Af Amer: 135 mL/min/{1.73_m2} (ref >59)
GFR calc non Af Amer: 118 mL/min/{1.73_m2} (ref >59)
Globulin, Total: 2.7 g/dL (ref 1.5–4.5)
Glucose: 85 mg/dL (ref 65–99)
Potassium: 4.6 mmol/L (ref 3.5–5.2)
Sodium: 136 mmol/L (ref 134–144)
Total Protein: 7.2 g/dL (ref 6.0–8.5)

## 2020-06-04 LAB — CBC WITH DIFFERENTIAL/PLATELET
Basophils Absolute: 0 10*3/uL (ref 0.0–0.2)
Basos: 1 %
EOS (ABSOLUTE): 0 10*3/uL (ref 0.0–0.4)
Eos: 0 %
Hematocrit: 38.1 % (ref 34.0–46.6)
Hemoglobin: 13.1 g/dL (ref 11.1–15.9)
Immature Grans (Abs): 0 10*3/uL (ref 0.0–0.1)
Immature Granulocytes: 0 %
Lymphocytes Absolute: 1 10*3/uL (ref 0.7–3.1)
Lymphs: 31 %
MCH: 32.8 pg (ref 26.6–33.0)
MCHC: 34.4 g/dL (ref 31.5–35.7)
MCV: 96 fL (ref 79–97)
Monocytes Absolute: 0.4 10*3/uL (ref 0.1–0.9)
Monocytes: 13 %
Neutrophils Absolute: 1.8 10*3/uL (ref 1.4–7.0)
Neutrophils: 55 %
Platelets: 266 10*3/uL (ref 150–450)
RBC: 3.99 x10E6/uL (ref 3.77–5.28)
RDW: 11.6 % — ABNORMAL LOW (ref 11.7–15.4)
WBC: 3.3 10*3/uL — ABNORMAL LOW (ref 3.4–10.8)

## 2020-06-04 NOTE — Progress Notes (Signed)
CMP WNL

## 2020-06-04 NOTE — Progress Notes (Signed)
And low WBC count noted.  Please advise patient to reduce methotrexate to 0.8 mL subcu weekly.  She should repeat CBC in 1 month.

## 2020-06-11 DIAGNOSIS — Z6828 Body mass index (BMI) 28.0-28.9, adult: Secondary | ICD-10-CM | POA: Diagnosis not present

## 2020-06-11 DIAGNOSIS — G4700 Insomnia, unspecified: Secondary | ICD-10-CM | POA: Diagnosis not present

## 2020-06-11 DIAGNOSIS — F419 Anxiety disorder, unspecified: Secondary | ICD-10-CM | POA: Diagnosis not present

## 2020-06-25 NOTE — Progress Notes (Deleted)
Office Visit Note  Patient: Shannon David             Date of Birth: [birth year]           MRN: [id number]             PCP: Eunice Blase, PA-C Referring: Eunice Blase, PA-C Visit Date: 07/08/2020 Occupation: @GUAROCC @  Subjective:  No chief complaint on file.   History of Present Illness: Shannon David is a 29 y.o. female ***   Activities of Daily Living:  Patient reports morning stiffness for *** {minute/hour:19697}.   Patient {ACTIONS;DENIES/REPORTS:21021675::"Denies"} nocturnal pain.  Difficulty dressing/grooming: {ACTIONS;DENIES/REPORTS:21021675::"Denies"} Difficulty climbing stairs: {ACTIONS;DENIES/REPORTS:21021675::"Denies"} Difficulty getting out of chair: {ACTIONS;DENIES/REPORTS:21021675::"Denies"} Difficulty using hands for taps, buttons, cutlery, and/or writing: {ACTIONS;DENIES/REPORTS:21021675::"Denies"}  No Rheumatology ROS completed.   PMFS History:  Patient Active Problem List   Diagnosis Date Noted  . History of PCOS 02/21/2017  . High risk medication use 09/21/2016  . Rheumatoid arthritis involving multiple sites with positive rheumatoid factor (HCC) 09/21/2016  . History of hypothyroidism 09/21/2016  . History of depression 09/21/2016    Past Medical History:  Diagnosis Date  . Arthritis   . Rheumatoid arthritis (HCC)     Family History  Problem Relation Age of Onset  . Hypertension Mother   . Clotting disorder Mother   . Cancer Maternal Grandmother        lung   Past Surgical History:  Procedure Laterality Date  . BUNIONECTOMY Right    Social History   Social History Narrative  . Not on file    There is no immunization history on file for this patient.   Objective: Vital Signs: There were no vitals taken for this visit.   Physical Exam   Musculoskeletal Exam: ***  CDAI Exam: CDAI Score: -- Patient Global: --; Provider Global: -- Swollen: --; Tender: -- Joint Exam 07/08/2020   No joint exam has been documented for this  visit   There is currently no information documented on the homunculus. Go to the Rheumatology activity and complete the homunculus joint exam.  Investigation: No additional findings.  Imaging: No results found.  Recent Labs: Lab Results  Component Value Date   WBC 3.3 (L) 06/03/2020   HGB 13.1 06/03/2020   PLT 266 06/03/2020   NA 136 06/03/2020   K 4.6 06/03/2020   CL 102 06/03/2020   CO2 20 06/03/2020   GLUCOSE 85 06/03/2020   BUN 12 06/03/2020   CREATININE 0.67 06/03/2020   BILITOT 0.4 06/03/2020   ALKPHOS 59 06/03/2020   AST 13 06/03/2020   ALT 9 06/03/2020   PROT 7.2 06/03/2020   ALBUMIN 4.5 06/03/2020   CALCIUM 9.7 06/03/2020   GFRAA 135 06/03/2020   QFTBGOLD Negative 08/13/2016   QFTBGOLDPLUS NEGATIVE 09/19/2019    Speciality Comments: Prior therapy: Orencia (inadequate response 9/18-9/19), Enbrel (inadequate response 3/15/5/15), Humira (inadequate response 11/17-3/18), and injectable methotrexate (non-compliance)  Procedures:  No procedures performed Allergies: Keflex [cephalexin]   Assessment / Plan:     Visit Diagnoses: No diagnosis found.  Orders: No orders of the defined types were placed in this encounter.  No orders of the defined types were placed in this encounter.   Face-to-face time spent with patient was *** minutes. Greater than 50% of time was spent in counseling and coordination of care.  Follow-Up Instructions: No follow-ups on file.   01-11-1997, CMA  Note - This record has been created using Ellen Henri.  Chart creation errors have been sought, but  may not always  have been located. Such creation errors do not reflect on  the standard of medical care.

## 2020-07-04 ENCOUNTER — Telehealth: Payer: Self-pay | Admitting: Rheumatology

## 2020-07-04 ENCOUNTER — Other Ambulatory Visit: Payer: Self-pay | Admitting: Rheumatology

## 2020-07-04 DIAGNOSIS — R051 Acute cough: Secondary | ICD-10-CM | POA: Diagnosis not present

## 2020-07-04 DIAGNOSIS — J069 Acute upper respiratory infection, unspecified: Secondary | ICD-10-CM | POA: Diagnosis not present

## 2020-07-04 DIAGNOSIS — Z20828 Contact with and (suspected) exposure to other viral communicable diseases: Secondary | ICD-10-CM | POA: Diagnosis not present

## 2020-07-04 NOTE — Telephone Encounter (Signed)
I received a text from the answering service at 8:53 PM that the patient has tested positive for COVID. I called Shannon David, she stated that she went to [facility] as she had been running fever 104 degree F, having body aches and headaches since yesterday. Her rapid screen test was positive for COVID. She was told to go home and contact us to arrange monoclonal antibody infusion. She denied any shortness of breath. She has been on Methotrexate and Rinvoq which she stopped yesterday.  I  placed the referral for outpatient COVID treatment and informed patient about the short of supply of monoclonal antibodies. She is not vaccinated against COVID-19. She understands that she is at high risk due to the immunosuppression. I advised her to go to the ED if her symptoms get worse. Pollyann Savoy, MD

## 2020-07-04 NOTE — Progress Notes (Deleted)
Office Visit Note  Patient: Shannon David             Date of Birth: 1991/02/25           MRN: [id number]             PCP: Eunice Blase, PA-C Referring: Eunice Blase, PA-C Visit Date: 07/14/2020 Occupation: @GUAROCC @  Subjective:  No chief complaint on file.   History of Present Illness: Shannon David is a 30 y.o. female ***   Activities of Daily Living:  Patient reports morning stiffness for *** {minute/hour:19697}.   Patient {ACTIONS;DENIES/REPORTS:21021675::"Denies"} nocturnal pain.  Difficulty dressing/grooming: {ACTIONS;DENIES/REPORTS:21021675::"Denies"} Difficulty climbing stairs: {ACTIONS;DENIES/REPORTS:21021675::"Denies"} Difficulty getting out of chair: {ACTIONS;DENIES/REPORTS:21021675::"Denies"} Difficulty using hands for taps, buttons, cutlery, and/or writing: {ACTIONS;DENIES/REPORTS:21021675::"Denies"}  No Rheumatology ROS completed.   PMFS History:  Patient Active Problem List   Diagnosis Date Noted  . History of PCOS 02/21/2017  . High risk medication use 09/21/2016  . Rheumatoid arthritis involving multiple sites with positive rheumatoid factor (HCC) 09/21/2016  . History of hypothyroidism 09/21/2016  . History of depression 09/21/2016    Past Medical History:  Diagnosis Date  . Arthritis   . Rheumatoid arthritis (HCC)     Family History  Problem Relation Age of Onset  . Hypertension Mother   . Clotting disorder Mother   . Cancer Maternal Grandmother        lung   Past Surgical History:  Procedure Laterality Date  . BUNIONECTOMY Right    Social History   Social History Narrative  . Not on file    There is no immunization history on file for this patient.   Objective: Vital Signs: There were no vitals taken for this visit.   Physical Exam   Musculoskeletal Exam: ***  CDAI Exam: CDAI Score: -- Patient Global: --; Provider Global: -- Swollen: --; Tender: -- Joint Exam 07/14/2020   No joint exam has been documented for this  visit   There is currently no information documented on the homunculus. Go to the Rheumatology activity and complete the homunculus joint exam.  Investigation: No additional findings.  Imaging: No results found.  Recent Labs: Lab Results  Component Value Date   WBC 3.3 (L) 06/03/2020   HGB 13.1 06/03/2020   PLT 266 06/03/2020   NA 136 06/03/2020   K 4.6 06/03/2020   CL 102 06/03/2020   CO2 20 06/03/2020   GLUCOSE 85 06/03/2020   BUN 12 06/03/2020   CREATININE 0.67 06/03/2020   BILITOT 0.4 06/03/2020   ALKPHOS 59 06/03/2020   AST 13 06/03/2020   ALT 9 06/03/2020   PROT 7.2 06/03/2020   ALBUMIN 4.5 06/03/2020   CALCIUM 9.7 06/03/2020   GFRAA 135 06/03/2020   QFTBGOLD Negative 08/13/2016   QFTBGOLDPLUS NEGATIVE 09/19/2019    Speciality Comments: Prior therapy: Orencia (inadequate response 9/18-9/19), Enbrel (inadequate response 3/15/5/15), Humira (inadequate response 11/17-3/18), and injectable methotrexate (non-compliance)  Procedures:  No procedures performed Allergies: Keflex [cephalexin]   Assessment / Plan:     Visit Diagnoses: Rheumatoid arthritis involving multiple sites with positive rheumatoid factor (HCC)  High risk medication use - MTX and folic acid  Primary osteoarthritis of both knees  Trigger finger, right ring finger  History of hypothyroidism  History of PCOS  History of depression  Orders: No orders of the defined types were placed in this encounter.  No orders of the defined types were placed in this encounter.   Face-to-face time spent with patient was *** minutes. Greater than 50% of  time was spent in counseling and coordination of care.  Follow-Up Instructions: No follow-ups on file.   Ofilia Neas, PA-C  Note - This record has been created using Dragon software.  Chart creation errors have been sought, but may not always  have been located. Such creation errors do not reflect on  the standard of medical care.

## 2020-07-05 ENCOUNTER — Other Ambulatory Visit: Payer: Self-pay | Admitting: Rheumatology

## 2020-07-05 DIAGNOSIS — M0579 Rheumatoid arthritis with rheumatoid factor of multiple sites without organ or systems involvement: Secondary | ICD-10-CM

## 2020-07-07 NOTE — Telephone Encounter (Signed)
*  Patient is COVID+ and has rescheduled 07/07/2020 appointment for 07/14/2020.   Last Visit: 04/07/2020 Next Visit: 07/14/2020 Labs: 06/03/2020 CMP WNL, low WBC count noted. Please advise patient to reduce methotrexate to 0.8 mL subcu weekly. She should repeat CBC in 1 month.  Current Dose per office note 04/07/2020: Methotrexate 1.0 mL sq injections once weekly, *per lab note dose reduced to 0.8 mL subcu weekly.  DX: Rheumatoid arthritis involving multiple sites with positive rheumatoid factor  Okay to refill MTX?

## 2020-07-08 ENCOUNTER — Ambulatory Visit: Payer: BC Managed Care – PPO | Admitting: Rheumatology

## 2020-07-14 ENCOUNTER — Ambulatory Visit: Payer: BC Managed Care – PPO | Admitting: Physician Assistant

## 2020-07-14 DIAGNOSIS — M170 Bilateral primary osteoarthritis of knee: Secondary | ICD-10-CM

## 2020-07-14 DIAGNOSIS — Z8639 Personal history of other endocrine, nutritional and metabolic disease: Secondary | ICD-10-CM

## 2020-07-14 DIAGNOSIS — Z8659 Personal history of other mental and behavioral disorders: Secondary | ICD-10-CM

## 2020-07-14 DIAGNOSIS — Z8742 Personal history of other diseases of the female genital tract: Secondary | ICD-10-CM

## 2020-07-14 DIAGNOSIS — M65341 Trigger finger, right ring finger: Secondary | ICD-10-CM

## 2020-07-14 DIAGNOSIS — Z79899 Other long term (current) drug therapy: Secondary | ICD-10-CM

## 2020-07-14 DIAGNOSIS — M0579 Rheumatoid arthritis with rheumatoid factor of multiple sites without organ or systems involvement: Secondary | ICD-10-CM

## 2020-07-16 DIAGNOSIS — F339 Major depressive disorder, recurrent, unspecified: Secondary | ICD-10-CM | POA: Diagnosis not present

## 2020-07-18 ENCOUNTER — Telehealth: Payer: Self-pay

## 2020-07-18 ENCOUNTER — Other Ambulatory Visit: Payer: Self-pay | Admitting: Rheumatology

## 2020-07-18 DIAGNOSIS — Z79899 Other long term (current) drug therapy: Secondary | ICD-10-CM

## 2020-07-18 NOTE — Telephone Encounter (Signed)
Patient called requesting labwork orders be sent to [facility] in [place] at [facility].  Patient states she is planning to go this morning.

## 2020-07-18 NOTE — Telephone Encounter (Signed)
Lab Orders Released.  

## 2020-07-19 LAB — CMP14+EGFR
ALT: 23 IU/L (ref 0–32)
AST: 20 IU/L (ref 0–40)
Albumin/Globulin Ratio: 1.4 (ref 1.2–2.2)
Albumin: 3.8 g/dL (ref 3.8–4.8)
Alkaline Phosphatase: 61 IU/L (ref 44–121)
BUN/Creatinine Ratio: 25 — ABNORMAL HIGH (ref 9–23)
BUN: 17 mg/dL (ref 6–20)
Bilirubin Total: 0.3 mg/dL (ref 0.0–1.2)
CO2: 23 mmol/L (ref 20–29)
Calcium: 8.7 mg/dL (ref 8.7–10.2)
Chloride: 103 mmol/L (ref 96–106)
Creatinine, Ser: 0.69 mg/dL (ref 0.57–1.00)
GFR calc Af Amer: 134 mL/min/{1.73_m2} (ref >59)
GFR calc non Af Amer: 116 mL/min/{1.73_m2} (ref >59)
Globulin, Total: 2.7 g/dL (ref 1.5–4.5)
Glucose: 73 mg/dL (ref 65–99)
Potassium: 4.3 mmol/L (ref 3.5–5.2)
Sodium: 139 mmol/L (ref 134–144)
Total Protein: 6.5 g/dL (ref 6.0–8.5)

## 2020-07-19 LAB — CBC WITH DIFFERENTIAL/PLATELET
Basophils Absolute: 0 10*3/uL (ref 0.0–0.2)
Basos: 0 %
EOS (ABSOLUTE): 0 10*3/uL (ref 0.0–0.4)
Eos: 0 %
Hematocrit: 34.4 % (ref 34.0–46.6)
Hemoglobin: 11.8 g/dL (ref 11.1–15.9)
Immature Grans (Abs): 0 10*3/uL (ref 0.0–0.1)
Immature Granulocytes: 0 %
Lymphocytes Absolute: 1.3 10*3/uL (ref 0.7–3.1)
Lymphs: 25 %
MCH: 32.3 pg (ref 26.6–33.0)
MCHC: 34.3 g/dL (ref 31.5–35.7)
MCV: 94 fL (ref 79–97)
Monocytes Absolute: 0.7 10*3/uL (ref 0.1–0.9)
Monocytes: 12 %
Neutrophils Absolute: 3.3 10*3/uL (ref 1.4–7.0)
Neutrophils: 63 %
Platelets: 234 10*3/uL (ref 150–450)
RBC: 3.65 x10E6/uL — ABNORMAL LOW (ref 3.77–5.28)
RDW: 11.4 % — ABNORMAL LOW (ref 11.7–15.4)
WBC: 5.3 10*3/uL (ref 3.4–10.8)

## 2020-07-22 ENCOUNTER — Telehealth: Payer: Self-pay | Admitting: *Deleted

## 2020-07-22 NOTE — Telephone Encounter (Signed)
Labs received from:[facility] Drawn on: 07/18/2020 Reviewed by: Dr. Bo Merino  Labs drawn:CBC w/Diff, [phone]  Results: BUN/CREATININE RATIO 25, RBC 3.65, RDW 11.4

## 2020-07-22 NOTE — Progress Notes (Signed)
CBC and CMP are table.

## 2020-07-28 NOTE — Progress Notes (Deleted)
Office Visit Note  Patient: Shannon David             Date of Birth: [birth year]           MRN: [id number]             PCP: Eunice Blase, PA-C Referring: Eunice Blase, PA-C Visit Date: 08/11/2020 Occupation: @GUAROCC @  Subjective:  No chief complaint on file.   History of Present Illness: Shannon David is a 30 y.o. female ***   Activities of Daily Living:  Patient reports morning stiffness for *** {minute/hour:19697}.   Patient {ACTIONS;DENIES/REPORTS:21021675::"Denies"} nocturnal pain.  Difficulty dressing/grooming: {ACTIONS;DENIES/REPORTS:21021675::"Denies"} Difficulty climbing stairs: {ACTIONS;DENIES/REPORTS:21021675::"Denies"} Difficulty getting out of chair: {ACTIONS;DENIES/REPORTS:21021675::"Denies"} Difficulty using hands for taps, buttons, cutlery, and/or writing: {ACTIONS;DENIES/REPORTS:21021675::"Denies"}  No Rheumatology ROS completed.   PMFS History:  Patient Active Problem List   Diagnosis Date Noted  . History of PCOS 02/21/2017  . High risk medication use 09/21/2016  . Rheumatoid arthritis involving multiple sites with positive rheumatoid factor (HCC) 09/21/2016  . History of hypothyroidism 09/21/2016  . History of depression 09/21/2016    Past Medical History:  Diagnosis Date  . Arthritis   . Rheumatoid arthritis (HCC)     Family History  Problem Relation Age of Onset  . Hypertension Mother   . Clotting disorder Mother   . Cancer Maternal Grandmother        lung   Past Surgical History:  Procedure Laterality Date  . BUNIONECTOMY Right    Social History   Social History Narrative  . Not on file    There is no immunization history on file for this patient.   Objective: Vital Signs: There were no vitals taken for this visit.   Physical Exam   Musculoskeletal Exam: ***  CDAI Exam: CDAI Score: - Patient Global: -; Provider Global: - Swollen: -; Tender: - Joint Exam 08/11/2020   No joint exam has been documented for this visit    There is currently no information documented on the homunculus. Go to the Rheumatology activity and complete the homunculus joint exam.  Investigation: No additional findings.  Imaging: No results found.  Recent Labs: Lab Results  Component Value Date   WBC 5.3 07/18/2020   HGB 11.8 07/18/2020   PLT 234 07/18/2020   NA 139 07/18/2020   K 4.3 07/18/2020   CL 103 07/18/2020   CO2 23 07/18/2020   GLUCOSE 73 07/18/2020   BUN 17 07/18/2020   CREATININE 0.69 07/18/2020   BILITOT 0.3 07/18/2020   ALKPHOS 61 07/18/2020   AST 20 07/18/2020   ALT 23 07/18/2020   PROT 6.5 07/18/2020   ALBUMIN 3.8 07/18/2020   CALCIUM 8.7 07/18/2020   GFRAA 134 07/18/2020   QFTBGOLD Negative 08/13/2016   QFTBGOLDPLUS NEGATIVE 09/19/2019    Speciality Comments: Prior therapy: Orencia (inadequate response 9/18-9/19), Enbrel (inadequate response 3/15/5/15), Humira (inadequate response 11/17-3/18), and injectable methotrexate (non-compliance)  Procedures:  No procedures performed Allergies: Keflex [cephalexin]   Assessment / Plan:     Visit Diagnoses: Rheumatoid arthritis involving multiple sites with positive rheumatoid factor (HCC)  High risk medication use  Primary osteoarthritis of both knees  Trigger finger, right ring finger  History of hypothyroidism  History of PCOS  History of depression  Orders: No orders of the defined types were placed in this encounter.  No orders of the defined types were placed in this encounter.   Face-to-face time spent with patient was *** minutes. Greater than 50% of time was spent in counseling and  coordination of care.  Follow-Up Instructions: No follow-ups on file.   Ofilia Neas, PA-C  Note - This record has been created using Dragon software.  Chart creation errors have been sought, but may not always  have been located. Such creation errors do not reflect on  the standard of medical care.

## 2020-08-04 ENCOUNTER — Telehealth: Payer: Self-pay | Admitting: *Deleted

## 2020-08-04 MED ORDER — METHOTREXATE 2.5 MG PO TABS: 20.0000 mg | ORAL_TABLET | ORAL | 0 refills | Status: DC

## 2020-08-04 NOTE — Telephone Encounter (Signed)
Error

## 2020-08-04 NOTE — Telephone Encounter (Signed)
Okay to switch to oral methotrexate from injectable methotrexate.

## 2020-08-11 ENCOUNTER — Ambulatory Visit: Payer: BC Managed Care – PPO | Admitting: Physician Assistant

## 2020-08-11 DIAGNOSIS — Z8639 Personal history of other endocrine, nutritional and metabolic disease: Secondary | ICD-10-CM

## 2020-08-11 DIAGNOSIS — Z8659 Personal history of other mental and behavioral disorders: Secondary | ICD-10-CM

## 2020-08-11 DIAGNOSIS — Z8742 Personal history of other diseases of the female genital tract: Secondary | ICD-10-CM

## 2020-08-11 DIAGNOSIS — M65341 Trigger finger, right ring finger: Secondary | ICD-10-CM

## 2020-08-11 DIAGNOSIS — M0579 Rheumatoid arthritis with rheumatoid factor of multiple sites without organ or systems involvement: Secondary | ICD-10-CM

## 2020-08-11 DIAGNOSIS — M170 Bilateral primary osteoarthritis of knee: Secondary | ICD-10-CM

## 2020-08-11 DIAGNOSIS — Z79899 Other long term (current) drug therapy: Secondary | ICD-10-CM

## 2020-08-12 ENCOUNTER — Other Ambulatory Visit: Payer: Self-pay | Admitting: Rheumatology

## 2020-08-12 DIAGNOSIS — M0579 Rheumatoid arthritis with rheumatoid factor of multiple sites without organ or systems involvement: Secondary | ICD-10-CM

## 2020-08-12 NOTE — Telephone Encounter (Signed)
Please call patient to schedule appt. Thank you.  Return in about 3 months (around 07/08/2020) for Rheumatoid arthritis, Osteoarthritis.

## 2020-08-12 NOTE — Telephone Encounter (Signed)
Patient is scheduled for Wednesday, 08/20/20 at 8:40 am with Ladona Ridgel

## 2020-08-12 NOTE — Telephone Encounter (Signed)
Last Visit: 04/07/2020 Next Visit: Message sent to front desk to schedule appt, Return in about 3 months (around 07/08/2020) for Rheumatoid arthritis, Osteoarthritis.  Labs: 07/18/2020, CBC and CMP are table. TB Gold: 09/19/2019, negative  Current Dose per office note 04/07/2020, Rinvoq 15 mg 1 tablet by mouth daily  DX: Rheumatoid arthritis involving multiple sites with positive rheumatoid factor   Last Fill: 11/20/2019  Okay to refill Rinvoq?

## 2020-08-12 NOTE — Progress Notes (Signed)
Office Visit Note  Patient: Shannon David             Date of Birth: Oct 06, 1990           MRN: [id number]             PCP: Eunice Blase, PA-C Referring: Eunice Blase, PA-C Visit Date: 08/20/2020 Occupation: @GUAROCC @  Subjective:  Right wrist joint pain   History of Present Illness: Catrena Thurn is a 30 y.o. female with history of seropositive rheumatoid arthritis and osteoarthritis. Patient is on Rinvoq 15 mg 1 tablet by mouth daily, methotrexate 1.0 mL sq injections once weekly, and folic acid 2 mg by mouth daily. According to the patient she was diagnosed with COVID-19 on 07/04/2020. She had both rinvoq and methotrexate until 07/13/2020. Her Covid symptoms have completely resolved. Patient reports that since having a gap in therapy she has had a slight increased frequency of flares. She states last week she had a flare in her right knee which resolved after taking ibuprofen. She states that yesterday she was experiencing pain in her right wrist and continues to have some persistent swelling on the dorsal aspect. She states that she took ibuprofen last night which has alleviated some of her discomfort.   Activities of Daily Living:  Patient reports morning stiffness for 2 hours.   Patient Reports nocturnal pain.  Difficulty dressing/grooming: Denies Difficulty climbing stairs: Denies Difficulty getting out of chair: Denies Difficulty using hands for taps, buttons, cutlery, and/or writing: Reports  Review of Systems  Constitutional: Positive for fatigue.  HENT: Negative for mouth sores, mouth dryness and nose dryness.   Eyes: Negative for pain, itching, visual disturbance and dryness.  Respiratory: Negative for cough, hemoptysis, shortness of breath and difficulty breathing.   Cardiovascular: Negative for chest pain, palpitations and swelling in legs/feet.  Gastrointestinal: Negative for abdominal pain, blood in stool, constipation and diarrhea.  Endocrine: Negative for  increased urination.  Genitourinary: Negative for painful urination.  Musculoskeletal: Positive for arthralgias, joint pain, joint swelling, myalgias, muscle weakness, morning stiffness, muscle tenderness and myalgias.  Skin: Negative for color change, rash and redness.  Allergic/Immunologic: Positive for susceptible to infections.  Neurological: Positive for headaches and weakness. Negative for dizziness, numbness and memory loss.  Hematological: Negative for swollen glands.  Psychiatric/Behavioral: Negative for confusion and sleep disturbance.    PMFS History:  Patient Active Problem List   Diagnosis Date Noted  . History of PCOS 02/21/2017  . High risk medication use 09/21/2016  . Rheumatoid arthritis involving multiple sites with positive rheumatoid factor (HCC) 09/21/2016  . History of hypothyroidism 09/21/2016  . History of depression 09/21/2016    Past Medical History:  Diagnosis Date  . Arthritis   . Rheumatoid arthritis (HCC)     Family History  Problem Relation Age of Onset  . Hypertension Mother   . Clotting disorder Mother   . Cancer Maternal Grandmother        lung   Past Surgical History:  Procedure Laterality Date  . BUNIONECTOMY Right    Social History   Social History Narrative  . Not on file    There is no immunization history on file for this patient.   Objective: Vital Signs: BP 114/74 (BP Location: Left Arm, Patient Position: Sitting, Cuff Size: Normal)   Pulse 81   Ht 5\' 2"  (1.575 m)   Wt 198 lb (89.8 kg)   BMI 36.21 kg/m    Physical Exam Vitals and nursing note reviewed.  Constitutional:  Appearance: She is well-developed and well-nourished.  HENT:     Head: Normocephalic and atraumatic.  Eyes:     Extraocular Movements: EOM normal.     Conjunctiva/sclera: Conjunctivae normal.  Cardiovascular:     Pulses: Intact distal pulses.  Pulmonary:     Effort: Pulmonary effort is normal.  Abdominal:     Palpations: Abdomen is soft.   Musculoskeletal:     Cervical back: Normal range of motion.  Lymphadenopathy:     Cervical: No cervical adenopathy.  Skin:    General: Skin is warm and dry.     Capillary Refill: Capillary refill takes less than 2 seconds.  Neurological:     Mental Status: She is alert and oriented to person, place, and time.  Psychiatric:        Mood and Affect: Mood and affect normal.        Behavior: Behavior normal.      Musculoskeletal Exam: C-spine, thoracic spine, and lumbar spine good ROM.  Shoulder joints and elbow joints good ROM.  Limited ROM and synovial thickening of both wrist joints. Mild extensor tenosynovitis of right wrist.  Synovitis and mild tenderness over the right second and third MCP joints.  Hip joints have good range of motion with no discomfort.  Knee joints have good range of motion with no warmth or effusion.  Ankle joints have good range of motion with no tenderness or inflammation.   CDAI Exam: CDAI Score: 6.8  Patient Global: 4 mm; Provider Global: 4 mm Swollen: 3 ; Tender: 3  Joint Exam 08/20/2020      Right  Left  Wrist  Swollen Tender     MCP 2  Swollen Tender     MCP 3  Swollen Tender        Investigation: No additional findings.  Imaging: No results found.  Recent Labs: Lab Results  Component Value Date   WBC 5.3 07/18/2020   HGB 11.8 07/18/2020   PLT 234 07/18/2020   NA 139 07/18/2020   K 4.3 07/18/2020   CL 103 07/18/2020   CO2 23 07/18/2020   GLUCOSE 73 07/18/2020   BUN 17 07/18/2020   CREATININE 0.69 07/18/2020   BILITOT 0.3 07/18/2020   ALKPHOS 61 07/18/2020   AST 20 07/18/2020   ALT 23 07/18/2020   PROT 6.5 07/18/2020   ALBUMIN 3.8 07/18/2020   CALCIUM 8.7 07/18/2020   GFRAA 134 07/18/2020   QFTBGOLD Negative 08/13/2016   QFTBGOLDPLUS NEGATIVE 09/19/2019    Speciality Comments: Prior therapy: Orencia (inadequate response 9/18-9/19), Enbrel (inadequate response 3/15/5/15), Humira (inadequate response 11/17-3/18), and injectable  methotrexate (non-compliance)  Procedures:  No procedures performed Allergies: Keflex [cephalexin]   Assessment / Plan:     Visit Diagnoses: Rheumatoid arthritis involving multiple sites with positive rheumatoid factor (HCC) - Positive RF, positive anti-CCP, 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  She has persistent tenderness and synovitis over the right second and third MCP joints.  She is currently on Rinvoq 15 mg 1 tablet by mouth daily, methotrexate 1.0 mL subcu injections once weekly, and folic acid 2 mg by mouth daily.  According to the patient she was diagnosed with COVID-19 on July 04, 2020 and held Rinvoq and methotrexate until July 13, 2020 once her symptoms had completely resolved.  She has been experiencing recurrent flares since having a gap in therapy.  Last week she had a flare in her right knee and yesterday she started  having pain in her right wrist.  She has been taking ibuprofen as needed which has been alleviating her symptoms.  She declined a prednisone taper at this time.  She will continue on the current treatment regimen.  She was advised to notify us if she continues to have recurrent flares.  She will follow-up in the office in 3 months to reassess.  High risk medication use - Rinvoq 15 mg 1 tablet by mouth daily, MTX 1.0 ml sq injections every 7 days, and folic 1 mg 2 tablets daily.  CBC and CMP were updated on July 18, 2020 and results were reviewed with the patient today in the office.  Her next lab work will be due in April and every 3 months to monitor for drug toxicity.  Standing orders for CBC and CMP remain in place.  TB gold negative on September 19, 2019.  Future order for TB gold was placed today.  Lipid panel within normal limits on March 06, 2020.  Future order for lipid panel was placed today.- Plan: QuantiFERON-TB Gold Plus She was diagnosed with COVID-19 on July 04, 2020.  She held  rinvoq and methotrexate until July 13, 2020 once her Covid symptoms had completely resolved.  She did not have a flare while off of Rinvoq or methotrexate.  We discussed the importance of holding Rinvoq and methotrexate anytime she develops signs or symptoms of infection and to resume once infection has completely cleared. She has not received the COVID-19 vaccinations and does not plan to at this time. Discussed box warning for increased MACE and malignancy while taking a Jak inhibitor.  All questions were addressed.  She was advised to monitor her blood pressure, blood sugar, and lipid panel closely.  Discussed importance of regular exercise. Association of heart disease with rheumatoid arthritis was discussed. Need to monitor blood pressure, cholesterol, and to exercise 30-60 minutes on daily basis was discussed.   Trigger finger, right ring finger: Resolved  Screening for tuberculosis -Future order for TB gold was placed today.  Plan: QuantiFERON-TB Gold Plus  Primary osteoarthritis of both knees: She has good range of motion of both knee joints on exam.  No warmth or effusion was noted. She had a flare in the right knee one week ago, which has resolved after taking ibuprofen as needed.   Other medical conditions are listed as follows:  History of hypothyroidism  History of PCOS  History of depression   Orders: Orders Placed This Encounter  Procedures  . QuantiFERON-TB Gold Plus  . Lipid panel   No orders of the defined types were placed in this encounter.    Follow-Up Instructions: Return in about 3 months (around 11/17/2020) for Rheumatoid arthritis.   Gearldine Bienenstock, PA-C  Note - This record has been created using Dragon software.  Chart creation errors have been sought, but may not always  have been located. Such creation errors do not reflect on  the standard of medical care.

## 2020-08-20 ENCOUNTER — Ambulatory Visit (INDEPENDENT_AMBULATORY_CARE_PROVIDER_SITE_OTHER): Payer: BC Managed Care – PPO | Admitting: Physician Assistant

## 2020-08-20 ENCOUNTER — Encounter: Payer: Self-pay | Admitting: Physician Assistant

## 2020-08-20 ENCOUNTER — Other Ambulatory Visit: Payer: Self-pay

## 2020-08-20 VITALS — BP 114/74 | HR 81 | Ht 62.0 in | Wt 198.0 lb

## 2020-08-20 DIAGNOSIS — Z8639 Personal history of other endocrine, nutritional and metabolic disease: Secondary | ICD-10-CM

## 2020-08-20 DIAGNOSIS — M65341 Trigger finger, right ring finger: Secondary | ICD-10-CM

## 2020-08-20 DIAGNOSIS — Z79899 Other long term (current) drug therapy: Secondary | ICD-10-CM

## 2020-08-20 DIAGNOSIS — Z8659 Personal history of other mental and behavioral disorders: Secondary | ICD-10-CM

## 2020-08-20 DIAGNOSIS — Z8742 Personal history of other diseases of the female genital tract: Secondary | ICD-10-CM

## 2020-08-20 DIAGNOSIS — M170 Bilateral primary osteoarthritis of knee: Secondary | ICD-10-CM | POA: Diagnosis not present

## 2020-08-20 DIAGNOSIS — M0579 Rheumatoid arthritis with rheumatoid factor of multiple sites without organ or systems involvement: Secondary | ICD-10-CM | POA: Diagnosis not present

## 2020-08-20 DIAGNOSIS — Z111 Encounter for screening for respiratory tuberculosis: Secondary | ICD-10-CM

## 2020-08-20 NOTE — Patient Instructions (Signed)
Standing Labs We placed an order today for your standing lab work.   Please have your standing labs drawn in April and every 3 months   If possible, please have your labs drawn 2 weeks prior to your appointment so that the provider can discuss your results at your appointment.  We have open lab daily Monday through Thursday from 1:30-4:30 PM and Friday from 1:30-4:00 PM at the office of Dr. Shaili Deveshwar, [facility].   Please be advised, all patients with office appointments requiring lab work will take precedents over walk-in lab work.  If possible, please come for your lab work on Monday and Friday afternoons, as you may experience shorter wait times. The office is located at [address], [address], [place], [place] [address] No appointment is necessary.   Labs are drawn by [facility]. Please bring your co-pay at the time of your lab draw.  You may receive a bill from [facility] for your lab work.  If you wish to have your labs drawn at another location, please call the office 24 hours in advance to send orders.  If you have any questions regarding directions or hours of operation,  please call [phone].   As a reminder, please drink plenty of water prior to coming for your lab work. Thanks!   

## 2020-08-28 ENCOUNTER — Other Ambulatory Visit: Payer: Self-pay | Admitting: Rheumatology

## 2020-09-08 ENCOUNTER — Telehealth: Payer: Self-pay

## 2020-09-08 NOTE — Telephone Encounter (Signed)
I called patient, patient's labs are not due until April, 2022, patient will call when she goes to [facility]

## 2020-09-08 NOTE — Telephone Encounter (Signed)
Patient called requesting her labwork orders be sent to [facility] in [place]. 

## 2020-09-12 DIAGNOSIS — E039 Hypothyroidism, unspecified: Secondary | ICD-10-CM | POA: Diagnosis not present

## 2020-09-12 DIAGNOSIS — D509 Iron deficiency anemia, unspecified: Secondary | ICD-10-CM | POA: Diagnosis not present

## 2020-09-12 DIAGNOSIS — M059 Rheumatoid arthritis with rheumatoid factor, unspecified: Secondary | ICD-10-CM | POA: Diagnosis not present

## 2020-09-12 DIAGNOSIS — Z79899 Other long term (current) drug therapy: Secondary | ICD-10-CM | POA: Diagnosis not present

## 2020-09-12 DIAGNOSIS — E785 Hyperlipidemia, unspecified: Secondary | ICD-10-CM | POA: Diagnosis not present

## 2020-09-12 DIAGNOSIS — F339 Major depressive disorder, recurrent, unspecified: Secondary | ICD-10-CM | POA: Diagnosis not present

## 2020-09-23 ENCOUNTER — Other Ambulatory Visit: Payer: Self-pay | Admitting: Rheumatology

## 2020-09-25 DIAGNOSIS — K529 Noninfective gastroenteritis and colitis, unspecified: Secondary | ICD-10-CM | POA: Diagnosis not present

## 2020-09-25 DIAGNOSIS — R509 Fever, unspecified: Secondary | ICD-10-CM | POA: Diagnosis not present

## 2020-09-25 DIAGNOSIS — R1084 Generalized abdominal pain: Secondary | ICD-10-CM | POA: Diagnosis not present

## 2020-09-30 DIAGNOSIS — K5904 Chronic idiopathic constipation: Secondary | ICD-10-CM | POA: Diagnosis not present

## 2020-09-30 DIAGNOSIS — Z800 Family history of malignant neoplasm of digestive organs: Secondary | ICD-10-CM | POA: Diagnosis not present

## 2020-09-30 DIAGNOSIS — R140 Abdominal distension (gaseous): Secondary | ICD-10-CM | POA: Diagnosis not present

## 2020-10-18 ENCOUNTER — Other Ambulatory Visit: Payer: Self-pay | Admitting: Rheumatology

## 2020-10-19 NOTE — Telephone Encounter (Addendum)
Next Visit: 11/19/2020  Last Visit: 08/20/2020  Last Fill: 08/04/2020  DX: Rheumatoid arthritis involving multiple sites with positive rheumatoid factor  Current Dose per office note 08/20/2020, MTX 1.0 ml sq injections every 7 days  I called patient, patient states she is taking 8 pills weekly, not injections.  Labs: 07/18/2020, CBC and CMP are table.  Okay to refill MTX?

## 2020-10-21 ENCOUNTER — Telehealth: Payer: Self-pay

## 2020-10-21 DIAGNOSIS — Z79899 Other long term (current) drug therapy: Secondary | ICD-10-CM

## 2020-10-21 DIAGNOSIS — Z111 Encounter for screening for respiratory tuberculosis: Secondary | ICD-10-CM

## 2020-10-21 NOTE — Telephone Encounter (Signed)
Lab orders have been released for [facility].  

## 2020-10-21 NOTE — Telephone Encounter (Signed)
Patient called requesting labwork orders be sent to [facility] in [place].  Patient will be going this morning 10/21/20.

## 2020-10-25 LAB — CMP14+EGFR
ALT: 45 IU/L — ABNORMAL HIGH (ref 0–32)
AST: 29 IU/L (ref 0–40)
Albumin/Globulin Ratio: 1.8 (ref 1.2–2.2)
Albumin: 4.5 g/dL (ref 3.8–4.8)
Alkaline Phosphatase: 92 IU/L (ref 44–121)
BUN/Creatinine Ratio: 20 (ref 9–23)
BUN: 11 mg/dL (ref 6–20)
Bilirubin Total: 0.3 mg/dL (ref 0.0–1.2)
CO2: 24 mmol/L (ref 20–29)
Calcium: 9.3 mg/dL (ref 8.7–10.2)
Chloride: 102 mmol/L (ref 96–106)
Creatinine, Ser: 0.55 mg/dL — ABNORMAL LOW (ref 0.57–1.00)
Globulin, Total: 2.5 g/dL (ref 1.5–4.5)
Glucose: 86 mg/dL (ref 65–99)
Potassium: 4.6 mmol/L (ref 3.5–5.2)
Sodium: 137 mmol/L (ref 134–144)
Total Protein: 7 g/dL (ref 6.0–8.5)
eGFR: 126 mL/min/{1.73_m2} (ref >59)

## 2020-10-25 LAB — QUANTIFERON-TB GOLD PLUS
QuantiFERON Mitogen Value: >10 IU/mL
QuantiFERON Nil Value: 0 IU/mL
QuantiFERON TB1 Ag Value: 0 IU/mL
QuantiFERON TB2 Ag Value: 0 IU/mL
QuantiFERON-TB Gold Plus: NEGATIVE

## 2020-10-25 LAB — CBC WITH DIFFERENTIAL/PLATELET
Basophils Absolute: 0 10*3/uL (ref 0.0–0.2)
Basos: 0 %
EOS (ABSOLUTE): 0 10*3/uL (ref 0.0–0.4)
Eos: 0 %
Hematocrit: 36.2 % (ref 34.0–46.6)
Hemoglobin: 12.6 g/dL (ref 11.1–15.9)
Immature Grans (Abs): 0 10*3/uL (ref 0.0–0.1)
Immature Granulocytes: 0 %
Lymphocytes Absolute: 0.8 10*3/uL (ref 0.7–3.1)
Lymphs: 16 %
MCH: 32.1 pg (ref 26.6–33.0)
MCHC: 34.8 g/dL (ref 31.5–35.7)
MCV: 92 fL (ref 79–97)
Monocytes Absolute: 0.5 10*3/uL (ref 0.1–0.9)
Monocytes: 11 %
Neutrophils Absolute: 3.4 10*3/uL (ref 1.4–7.0)
Neutrophils: 73 %
Platelets: 255 10*3/uL (ref 150–450)
RBC: 3.92 x10E6/uL (ref 3.77–5.28)
RDW: 11.9 % (ref 11.7–15.4)
WBC: 4.7 10*3/uL (ref 3.4–10.8)

## 2020-10-25 LAB — LIPID PANEL
Chol/HDL Ratio: 2.5 ratio (ref 0.0–4.4)
Cholesterol, Total: 167 mg/dL (ref 100–199)
HDL: 67 mg/dL (ref >39)
LDL Chol Calc (NIH): 89 mg/dL (ref 0–99)
Triglycerides: 52 mg/dL (ref 0–149)
VLDL Cholesterol Cal: 11 mg/dL (ref 5–40)

## 2020-10-25 NOTE — Telephone Encounter (Signed)
TB gold is negative.

## 2020-11-05 NOTE — Progress Notes (Deleted)
Office Visit Note  Patient: Shannon David             Date of Birth: 1991/04/28           MRN: [id number]             PCP: Eunice Blase, PA-C Referring: Eunice Blase, PA-C Visit Date: 11/19/2020 Occupation: @GUAROCC @  Subjective:  No chief complaint on file.   History of Present Illness: Shannon David is a 30 y.o. female ***   Activities of Daily Living:  Patient reports morning stiffness for *** {minute/hour:19697}.   Patient {ACTIONS;DENIES/REPORTS:21021675::"Denies"} nocturnal pain.  Difficulty dressing/grooming: {ACTIONS;DENIES/REPORTS:21021675::"Denies"} Difficulty climbing stairs: {ACTIONS;DENIES/REPORTS:21021675::"Denies"} Difficulty getting out of chair: {ACTIONS;DENIES/REPORTS:21021675::"Denies"} Difficulty using hands for taps, buttons, cutlery, and/or writing: {ACTIONS;DENIES/REPORTS:21021675::"Denies"}  No Rheumatology ROS completed.   PMFS History:  Patient Active Problem List   Diagnosis Date Noted  . History of PCOS 02/21/2017  . High risk medication use 09/21/2016  . Rheumatoid arthritis involving multiple sites with positive rheumatoid factor (HCC) 09/21/2016  . History of hypothyroidism 09/21/2016  . History of depression 09/21/2016    Past Medical History:  Diagnosis Date  . Arthritis   . Rheumatoid arthritis (HCC)     Family History  Problem Relation Age of Onset  . Hypertension Mother   . Clotting disorder Mother   . Cancer Maternal Grandmother        lung   Past Surgical History:  Procedure Laterality Date  . BUNIONECTOMY Right    Social History   Social History Narrative  . Not on file    There is no immunization history on file for this patient.   Objective: Vital Signs: There were no vitals taken for this visit.   Physical Exam   Musculoskeletal Exam: ***  CDAI Exam: CDAI Score: -- Patient Global: --; Provider Global: -- Swollen: --; Tender: -- Joint Exam 11/19/2020   No joint exam has been documented for this  visit   There is currently no information documented on the homunculus. Go to the Rheumatology activity and complete the homunculus joint exam.  Investigation: No additional findings.  Imaging: No results found.  Recent Labs: Lab Results  Component Value Date   WBC 4.7 10/21/2020   HGB 12.6 10/21/2020   PLT 255 10/21/2020   NA 137 10/21/2020   K 4.6 10/21/2020   CL 102 10/21/2020   CO2 24 10/21/2020   GLUCOSE 86 10/21/2020   BUN 11 10/21/2020   CREATININE 0.55 (L) 10/21/2020   BILITOT 0.3 10/21/2020   ALKPHOS 92 10/21/2020   AST 29 10/21/2020   ALT 45 (H) 10/21/2020   PROT 7.0 10/21/2020   ALBUMIN 4.5 10/21/2020   CALCIUM 9.3 10/21/2020   GFRAA 134 07/18/2020   QFTBGOLD Negative 08/13/2016   QFTBGOLDPLUS Negative 10/21/2020    Speciality Comments: Prior therapy: Orencia (inadequate response 9/18-9/19), Enbrel (inadequate response 3/15/5/15), Humira (inadequate response 11/17-3/18), and injectable methotrexate (non-compliance)  Procedures:  No procedures performed Allergies: Keflex [cephalexin]   Assessment / Plan:     Visit Diagnoses: No diagnosis found.  Orders: No orders of the defined types were placed in this encounter.  No orders of the defined types were placed in this encounter.   Face-to-face time spent with patient was *** minutes. Greater than 50% of time was spent in counseling and coordination of care.  Follow-Up Instructions: No follow-ups on file.   01-11-1997, CMA  Note - This record has been created using Ellen Henri.  Chart creation errors have been sought,  but may not always  have been located. Such creation errors do not reflect on  the standard of medical care.

## 2020-11-14 DIAGNOSIS — M65342 Trigger finger, left ring finger: Secondary | ICD-10-CM | POA: Diagnosis not present

## 2020-11-19 ENCOUNTER — Ambulatory Visit: Payer: BC Managed Care – PPO | Admitting: Rheumatology

## 2020-11-19 DIAGNOSIS — Z8659 Personal history of other mental and behavioral disorders: Secondary | ICD-10-CM

## 2020-11-19 DIAGNOSIS — Z8639 Personal history of other endocrine, nutritional and metabolic disease: Secondary | ICD-10-CM

## 2020-11-19 DIAGNOSIS — Z8742 Personal history of other diseases of the female genital tract: Secondary | ICD-10-CM

## 2020-11-19 DIAGNOSIS — Z79899 Other long term (current) drug therapy: Secondary | ICD-10-CM

## 2020-11-19 DIAGNOSIS — M170 Bilateral primary osteoarthritis of knee: Secondary | ICD-10-CM

## 2020-11-19 DIAGNOSIS — M0579 Rheumatoid arthritis with rheumatoid factor of multiple sites without organ or systems involvement: Secondary | ICD-10-CM

## 2020-11-19 DIAGNOSIS — M65341 Trigger finger, right ring finger: Secondary | ICD-10-CM

## 2020-11-25 NOTE — Progress Notes (Signed)
Office Visit Note  Patient: Shannon David             Date of Birth: November 27, 1990           MRN: [id number]             PCP: Eunice Blase, PA-C Referring: Eunice Blase, PA-C Visit Date: 12/08/2020 Occupation: @GUAROCC @  Subjective:  Other (Patient reports pain,swelling and stiffness in bilateral hands and feet)   History of Present Illness: Shannon David is a 30 y.o. female with history of seropositive rheumatoid arthritis.  She states she has been having increased cramps in her bilateral hands.  She also notices decreased grip strength.  She has been taking her medications on a regular basis.  She states last Friday she woke up with swelling in her left ankle and left foot.  She had to miss work on Saturday and Sunday.  She kept ice on her ankle and foot and the symptoms improved.  The flare of her arthritis has started in the last few weeks.  Prior to that she was doing better.  She also went to a podiatrist and had an injection in the right third MTP joint.  It relieved some of her symptoms.  She told by the podiatrist it was due to an irritated nerve.  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  Activities of Daily Living:  Patient reports morning stiffness for 2-3 hours.   Patient Denies nocturnal pain.  Difficulty dressing/grooming: Denies Difficulty climbing stairs: Denies Difficulty getting out of chair: Denies Difficulty using hands for taps, buttons, cutlery, and/or writing: Reports  Review of Systems  Constitutional:  Positive for fatigue.  HENT:  Negative for mouth sores, mouth dryness and nose dryness.   Eyes:  Negative for pain, itching and dryness.  Respiratory:  Negative for shortness of breath and difficulty breathing.   Cardiovascular:  Negative for chest pain and palpitations.  Gastrointestinal:  Negative for blood in stool, constipation and diarrhea.  Endocrine: Negative for  increased urination.  Genitourinary:  Negative for difficulty urinating.  Musculoskeletal:  Positive for joint pain, joint pain, joint swelling and morning stiffness. Negative for myalgias, muscle tenderness and myalgias.  Skin:  Negative for color change, rash and redness.  Allergic/Immunologic: Positive for susceptible to infections.  Neurological:  Negative for dizziness, numbness, headaches, memory loss and weakness.  Hematological:  Positive for bruising/bleeding tendency.  Psychiatric/Behavioral:  Negative for confusion.    PMFS History:  Patient Active Problem List   Diagnosis Date Noted   History of PCOS 02/21/2017   High risk medication use 09/21/2016   Rheumatoid arthritis involving multiple sites with positive rheumatoid factor (HCC) 09/21/2016   History of hypothyroidism 09/21/2016   History of depression 09/21/2016    Past Medical History:  Diagnosis Date   Arthritis    Rheumatoid arthritis (HCC)     Family History  Problem Relation Age of Onset   Hypertension Mother    Clotting disorder Mother    Cancer Maternal Grandmother        lung   Past Surgical History:  Procedure Laterality Date   BUNIONECTOMY Right    Social History   Social History Narrative   Not on file    There is no immunization history on file for this patient.   Objective: Vital Signs: BP 121/80 (BP Location: Left Arm, Patient Position: Sitting, Cuff Size: Normal)   Pulse  80   Resp 14   Ht 5\' 2"  (1.575 m)   Wt 216 lb 3.2 oz (98.1 kg)   BMI 39.54 kg/m    Physical Exam Vitals and nursing note reviewed.  Constitutional:      Appearance: She is well-developed.  HENT:     Head: Normocephalic and atraumatic.  Eyes:     Conjunctiva/sclera: Conjunctivae normal.  Cardiovascular:     Rate and Rhythm: Normal rate and regular rhythm.     Heart sounds: Normal heart sounds.  Pulmonary:     Effort: Pulmonary effort is normal.     Breath sounds: Normal breath sounds.  Abdominal:      General: Bowel sounds are normal.     Palpations: Abdomen is soft.  Musculoskeletal:     Cervical back: Normal range of motion.  Lymphadenopathy:     Cervical: No cervical adenopathy.  Skin:    General: Skin is warm and dry.     Capillary Refill: Capillary refill takes less than 2 seconds.  Neurological:     Mental Status: She is alert and oriented to person, place, and time.  Psychiatric:        Behavior: Behavior normal.     Musculoskeletal Exam: C-spine was in good range of motion.  Shoulder joints, elbow joints were in good range of motion.  She had good range of motion of her wrist joints with no synovitis.  She had bilateral MCP thickening and subluxation.  No synovitis was noted.  She also has PIP thickening.  She had 7 phonatory change in her left fourth DIP joint.  Hip joints and knee joints in good range of motion without any warmth or swelling.  She had bilateral hammertoes.  No active synovitis was noted.  Subluxation of almost all MTP joints was noted.  CDAI Exam: CDAI Score: 1  Patient Global: 5 mm; Provider Global: 5 mm Swollen: 0 ; Tender: 1  Joint Exam 12/08/2020      Right  Left  DIP 5      Tender     Investigation: No additional findings.  Imaging: No results found.  Recent Labs: Lab Results  Component Value Date   WBC 4.7 10/21/2020   HGB 12.6 10/21/2020   PLT 255 10/21/2020   NA 137 10/21/2020   K 4.6 10/21/2020   CL 102 10/21/2020   CO2 24 10/21/2020   GLUCOSE 86 10/21/2020   BUN 11 10/21/2020   CREATININE 0.55 (L) 10/21/2020   BILITOT 0.3 10/21/2020   ALKPHOS 92 10/21/2020   AST 29 10/21/2020   ALT 45 (H) 10/21/2020   PROT 7.0 10/21/2020   ALBUMIN 4.5 10/21/2020   CALCIUM 9.3 10/21/2020   GFRAA 134 07/18/2020   QFTBGOLD Negative 08/13/2016   QFTBGOLDPLUS Negative 10/21/2020    Speciality Comments: Prior therapy: Orencia (inadequate response 9/18-9/19), Enbrel (inadequate response 3/15/5/15), Humira (inadequate response 11/17-3/18), and  injectable methotrexate (non-compliance)  Procedures:  No procedures performed Allergies: Keflex [cephalexin]   Assessment / Plan:     Visit Diagnoses: Rheumatoid arthritis involving multiple sites with positive rheumatoid factor (HCC) - Positive RF, positive anti-CCP, 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  She continues to have muscle spasm and discomfort in her hands.  I obtained x-rays of her bilateral hands and her bilateral feet today.  Radiographic progression was noted in her feet for the increased subluxation and erosive changes.  Mild radiographic progression was noted in  her hands.  We discussed different treatment options and their side effects.  She does not like injectable medications.  She has tried subcutaneous methotrexate in the past.  We discussed adding hydroxychloroquine 200 mg p.o. twice daily Monday to Friday.  Side effects are reviewed.  She was in agreement to proceed with the medication.  We will call in the prescription for hydroxychloroquine.  She has been advised to get baseline eye examination and then an eye examination every year.  I also offered low-dose prednisone which she declined.- Plan: Sedimentation rate  High risk medication use - Rinvoq 15 mg 1 tablet by mouth daily, MTX 8 tabs by mouth once daily, and folic 1 mg 2 tablets daily.  Labs from October 21, 2020 normal except mild elevation of LFTs.  TB gold was negative in April 2022.  Pain in both hands -she has been experiencing increased muscle cramps and discomfort in her hands.  No synovitis was noted.  Subluxation of almost all MCP joints was noted.  Plan: XR Hand 2 View Right, XR Hand 2 View Left.  X-ray showed severe erosive rheumatoid arthritis with mild progression.  Primary osteoarthritis of both knees-she denies discomfort in her knee joints today.  Pain in both feet -she continues to have pain and discomfort in her  bilateral feet.  She had episodes of recent swelling in her ankle and her feet.  She gets getting cortisone injection in her MTPs by the podiatrist.  Plan: XR Foot 2 Views Right, XR Foot 2 Views Left.  X-ray showed severe erosive rheumatoid arthritis with a new erosion in the left fifth MTP.  Right third MTP complete subluxation was noted when compared to the x-rays of 2020.  History of hypothyroidism  History of PCOS  History of depression  Orders: Orders Placed This Encounter  Procedures   XR Hand 2 View Right   XR Hand 2 View Left   XR Foot 2 Views Right   XR Foot 2 Views Left   Sedimentation rate    No orders of the defined types were placed in this encounter.    Follow-Up Instructions: Return in about 3 months (around 03/10/2021) for Rheumatoid arthritis.   Pollyann Savoy, MD  Note - This record has been created using Animal nutritionist.  Chart creation errors have been sought, but may not always  have been located. Such creation errors do not reflect on  the standard of medical care.

## 2020-11-26 DIAGNOSIS — G5761 Lesion of plantar nerve, right lower limb: Secondary | ICD-10-CM | POA: Diagnosis not present

## 2020-11-26 DIAGNOSIS — M2041 Other hammer toe(s) (acquired), right foot: Secondary | ICD-10-CM | POA: Diagnosis not present

## 2020-11-26 DIAGNOSIS — M7752 Other enthesopathy of left foot: Secondary | ICD-10-CM | POA: Diagnosis not present

## 2020-12-08 ENCOUNTER — Ambulatory Visit (INDEPENDENT_AMBULATORY_CARE_PROVIDER_SITE_OTHER): Payer: BC Managed Care – PPO | Admitting: Rheumatology

## 2020-12-08 ENCOUNTER — Ambulatory Visit: Payer: Self-pay

## 2020-12-08 ENCOUNTER — Other Ambulatory Visit: Payer: Self-pay

## 2020-12-08 ENCOUNTER — Encounter: Payer: Self-pay | Admitting: Rheumatology

## 2020-12-08 ENCOUNTER — Telehealth: Payer: Self-pay | Admitting: Pharmacist

## 2020-12-08 VITALS — BP 121/80 | HR 80 | Resp 14 | Ht 62.0 in | Wt 216.2 lb

## 2020-12-08 DIAGNOSIS — M79672 Pain in left foot: Secondary | ICD-10-CM

## 2020-12-08 DIAGNOSIS — M0579 Rheumatoid arthritis with rheumatoid factor of multiple sites without organ or systems involvement: Secondary | ICD-10-CM | POA: Diagnosis not present

## 2020-12-08 DIAGNOSIS — M79641 Pain in right hand: Secondary | ICD-10-CM

## 2020-12-08 DIAGNOSIS — M65341 Trigger finger, right ring finger: Secondary | ICD-10-CM

## 2020-12-08 DIAGNOSIS — M79671 Pain in right foot: Secondary | ICD-10-CM

## 2020-12-08 DIAGNOSIS — Z8742 Personal history of other diseases of the female genital tract: Secondary | ICD-10-CM

## 2020-12-08 DIAGNOSIS — Z79899 Other long term (current) drug therapy: Secondary | ICD-10-CM

## 2020-12-08 DIAGNOSIS — M79642 Pain in left hand: Secondary | ICD-10-CM | POA: Diagnosis not present

## 2020-12-08 DIAGNOSIS — Z8659 Personal history of other mental and behavioral disorders: Secondary | ICD-10-CM

## 2020-12-08 DIAGNOSIS — M170 Bilateral primary osteoarthritis of knee: Secondary | ICD-10-CM | POA: Diagnosis not present

## 2020-12-08 DIAGNOSIS — Z8639 Personal history of other endocrine, nutritional and metabolic disease: Secondary | ICD-10-CM

## 2020-12-08 MED ORDER — HYDROXYCHLOROQUINE SULFATE 200 MG PO TABS: ORAL_TABLET | ORAL | 1 refills | Status: DC

## 2020-12-08 NOTE — Telephone Encounter (Signed)
Submitted a Prior Authorization request to [facility] for OTREXUP via CoverMyMeds. Will update once we receive a response.   Key: [id number]

## 2020-12-08 NOTE — Telephone Encounter (Signed)
Received a fax regarding Prior Authorization from [facility] for OTREXUP. Authorization has been DENIED because: "Current plan approved criteria does not allow coverage of Otrexup unless the patient has tried generic methotrexate by mouth and it either did not work or caused a bad side effect. Supporting documentation must be provided. Also, the preferred product for the patient's health plan is Rasuvo. Current plan approved criteria does not allow coverage of the requested medication if there is missing clinical information including but not limited to: unless the patient has tried Rasuvo and had a bad side effect. Supporting chart note(s) must be submitted. Additional coverage criteria may apply, please review policy or plan documents for full requirements."  Submitting PA for Rasuvo since it was listed as preferred product.  Key: [id number]

## 2020-12-08 NOTE — Progress Notes (Addendum)
Pharmacy Note  Subjective: Patient presents today to [facility] for follow up office visit.   Patient seen by the pharmacist for counseling on hydroxychloroquine rheumatoid arthritis.  Prior therapy includes: injectable methotrexate, hydroxychloroquine, Orencia, Enbrel, Humira. She stopped injectable methotrexate d/t cost and family planning. Then she went back to MTX since family planning expectations changed. She does not like vial and syringe but is amenable to starting auto-injector.  She states that she is unable to take 8 tablets of methotrexate at once and has to split dosing to twice daily which can be inconvenient for her since she works.  She also states that she has to take ondansetron weekly due to nausea with each dose which adds medication burden.  She currently takes oral methotrexate 20mg  weekly and Rinvoq 15mg  daily  Objective: CMP     Component Value Date/Time   NA 137 10/21/2020 0828   K 4.6 10/21/2020 0828   CL 102 10/21/2020 0828   CO2 24 10/21/2020 0828   GLUCOSE 86 10/21/2020 0828   GLUCOSE 78 09/19/2019 0906   BUN 11 10/21/2020 0828   CREATININE 0.55 (L) 10/21/2020 0828   CREATININE 0.62 09/19/2019 0906   CALCIUM 9.3 10/21/2020 0828   PROT 7.0 10/21/2020 0828   ALBUMIN 4.5 10/21/2020 0828   AST 29 10/21/2020 0828   ALT 45 (H) 10/21/2020 0828   ALKPHOS 92 10/21/2020 0828   BILITOT 0.3 10/21/2020 0828   GFRNONAA 116 07/18/2020 1144   GFRNONAA 121 09/19/2019 0906   GFRAA 134 07/18/2020 1144   GFRAA 140 09/19/2019 0906    CBC    Component Value Date/Time   WBC 4.7 10/21/2020 0828   WBC 6.6 09/19/2019 0906   RBC 3.92 10/21/2020 0828   RBC 3.77 (L) 09/19/2019 0906   HGB 12.6 10/21/2020 0828   HCT 36.2 10/21/2020 0828   PLT 255 10/21/2020 0828   MCV 92 10/21/2020 0828   MCH 32.1 10/21/2020 0828   MCH 32.6 09/19/2019 0906   MCHC 34.8 10/21/2020 0828   MCHC 33.9 09/19/2019 0906   RDW 11.9 10/21/2020 0828   LYMPHSABS 0.8 10/21/2020 0828    MONOABS 62 (L) 12/22/2016 1349   EOSABS 0.0 10/21/2020 0828   BASOSABS 0.0 10/21/2020 0828    Assessment/Plan: Patient was counseled on the purpose, proper use, and adverse effects of hydroxychloroquine including nausea/diarrhea, skin rash, headaches, and sun sensitivity.  Discussed importance of annual eye exams while on hydroxychloroquine to monitor to ocular toxicity and discussed importance of frequent laboratory monitoring.  Provided patient with eye exam form for baseline ophthalmologic exam and standing lab instructions.  Provided patient with educational materials on hydroxychloroquine and answered all questions.  Patient consented to hydroxychloroquine.  Will upload consent in the media tab.    She will continue Rinvoq 15mg  daily and methotrexate 20mg  weekly along with folic acid 2mg  daily.  We will investigate Otrexup/Rasuvo for patient's methotrexate options. She is amenable to purusing Reditrex pre-filled syringe if Rasuvo is too expensive. If neither is an option, she will remain on oral MTX and continue taking ondansetron since she does not want to try vial/syringe again.  We will investigate injectable methotrexate for coverage. She is eligible for copay card  Dose will be Plaquenil 200 mg twice daily Monday through Friday.

## 2020-12-08 NOTE — Patient Instructions (Signed)
Standing Labs We placed an order today for your standing lab work.   Please have your standing labs drawn in July and every 3 months.  Please get sed rate with your next labs.  If possible, please have your labs drawn 2 weeks prior to your appointment so that the provider can discuss your results at your appointment.  Please note that you may see your imaging and lab results in MyChart before we have reviewed them. We may be awaiting multiple results to interpret others before contacting you. Please allow our office up to 72 hours to thoroughly review all of the results before contacting the office for clarification of your results.  We have open lab daily: Monday through Thursday from 1:30-4:30 PM and Friday from 1:30-4:00 PM at the office of Dr. Pollyann Savoy, [facility].   Please be advised, all patients with office appointments requiring lab work will take precedent over walk-in lab work.  If possible, please come for your lab work on Monday and Friday afternoons, as you may experience shorter wait times. The office is located at [address], [place], [place] [address] No appointment is necessary.   Labs are drawn by [facility]. Please bring your co-pay at the time of your lab draw.  You may receive a bill from [facility] for your lab work.  If you wish to have your labs drawn at another location, please call the office 24 hours in advance to send orders.  If you have any questions regarding directions or hours of operation,  please call [phone].   As a reminder, please drink plenty of water prior to coming for your lab work. Thanks!    Vaccines You are taking a medication(s) that can suppress your immune system.  The following immunizations are recommended: Flu annually Covid-19  Td/Tdap (tetanus, diphtheria, pertussis) every 10 years Pneumonia (Prevnar 15 then Pneumovax 23 at least 1 year apart.  Alternatively, can take Prevnar 20 without needing  additional dose) Shingrix (after age 57): 2 doses from 4 weeks to 6 months apart  Please check with your PCP to make sure you are up to date.   If you test POSITIVE for COVID19 and have MILD to MODERATE symptoms: First, call your PCP if you would like to receive COVID19 treatment AND Hold your medications during the infection and for at least 1 week after your symptoms have resolved: Injectable medication (Benlysta, Cimzia, Cosentyx, Enbrel, Humira, Orencia, Remicade, Simponi, Stelara, Taltz, Tremfya) Methotrexate Leflunomide (Arava) Mycophenolate (Cellcept) Shannon David, Olumiant, or Rinvoq If you take Actemra or Kevzara, you DO NOT need to hold these for COVID19 infection.  If you test POSITIVE for COVID19 and have NO symptoms: First, call your PCP if you would like to receive COVID19 treatment AND Hold your medications for at least 10 days after the day that you tested positive Injectable medication (Benlysta, Cimzia, Cosentyx, Enbrel, Humira, Orencia, Remicade, Simponi, Stelara, Taltz, Tremfya) Methotrexate Leflunomide (Arava) Mycophenolate (Cellcept) Shannon David, Olumiant, or Rinvoq If you take Actemra or Kevzara, you DO NOT need to hold these for COVID19 infection.  If you have signs or symptoms of an infection or start antibiotics: First, call your PCP for workup of your infection. Hold your medication through the infection, until you complete your antibiotics, and until symptoms resolve if you take the following: Injectable medication (Actemra, Benlysta, Cimzia, Cosentyx, Enbrel, Humira, Kevzara, Orencia, Remicade, Simponi, Stelara, Taltz, Tremfya) Methotrexate Leflunomide (Arava) Mycophenolate (Cellcept) [place]borne [place], or Rinvoq  Heart Disease Prevention   Your  inflammatory disease increases your risk of heart disease which includes heart attack, stroke, atrial fibrillation (irregular heartbeats), high blood pressure, heart failure and atherosclerosis (plaque in the arteries).   It is important to reduce your risk by:   Keep blood pressure, cholesterol, and blood sugar at healthy levels   Smoking Cessation   Maintain a healthy weight  BMI 20-25   Eat a healthy diet  Plenty of fresh fruit, vegetables, and whole grains  Limit saturated fats, foods high in sodium, and added sugars  DASH and Mediterranean diet   Increase physical activity  Recommend moderate physically activity for 150 minutes per week/ 30 minutes a day for five days a week These can be broken up into three separate ten-minute sessions during the day.   Reduce Stress  Meditation, slow breathing exercises, yoga, coloring books  Dental visits twice a year

## 2020-12-08 NOTE — Telephone Encounter (Addendum)
Please start Otrexup BIV. She was previously prescribed SQ MTX but was expensive therefore she stopped.  Dose: 20mg  SQ weekly  If Otrexup is not affordable even after copay card, she has been trained on Reditrex pre-filled syringe and is amenable to this option.  , PharmD, MPH Clinical Pharmacist (Rheumatology and Pulmonology)

## 2020-12-10 ENCOUNTER — Other Ambulatory Visit (HOSPITAL_COMMUNITY): Payer: Self-pay

## 2020-12-10 NOTE — Telephone Encounter (Signed)
Received a fax regarding Prior Authorization from [facility] for RASUVO. Authorization has been DENIED because patient has to have adverse effect with generic methotrexate by mouth AND either is ineffective or caused bad side effect.  I called patient to assess tolerability.  She states that she is unable to take 8 tablets of methotrexate at once and has to split dosing to twice daily which can be inconvenient for her.  She also states that she has to take ondansetron due to nausea with each dose which adds medication burden.  We will appeal for Charlynne Cousins, PharmD, MPH Clinical Pharmacist (Rheumatology and Pulmonology)

## 2020-12-10 NOTE — Telephone Encounter (Signed)
Submitted an URGENT appeal to [facility] for RASUVO via CoverMyMeds. Will update once we receive a response.  Ref: [id number] Phone: [phone] Fax: [phone]

## 2020-12-10 NOTE — Telephone Encounter (Signed)
Received a fax regarding Prior Authorization from [facility] for RASUVO. Authorization has been DENIED because:  "Current plan approved criteria does not allow coverage of Rasuvo unless the patient has tried generic methotrexate by mouth and it either did not work or caused a bad side effect. Supporting documentation must be provided."   Proceeding with Reditrex BIV. Awaiting follow-up questions from plan. Will update after receiving a determination.  Key: [id number]

## 2020-12-12 NOTE — Telephone Encounter (Signed)
Received call from Dr. Thomasena Edis regarding peer-to-peer for [employer]. I discussed that patient had previously taken vial/syringe and did not like the injections. He asked if she had been trained on vial/syringe and I discussed that she had taken vial/syringe prior to when I had started and was unable to attest but that we have a vial/syringe demo that providers use for training.  Chesley Mires, PharmD, MPH Clinical Pharmacist (Rheumatology and Pulmonology)

## 2020-12-12 NOTE — Telephone Encounter (Addendum)
Received fax from [facility] regarding Rasuvo appeal. Appeal for Rasuvo was denied. Patient states she feels nauseous looking at the vial and then is non-adherent since she will skip taking it.  Her plan will not approve Rasuvo unless she has medically necessity for Rasuvo including dexterity issues or adverse reaction associated with vial/syringe.  She stated that Rasuvo was previously approved, but I advised that formularies change year to year and because Rasuvo is specialty and more costly, they still want her to trial vial/syringe. Rasuvo was previously stopped by patient d/t cost regardless  For now she would like to stay on oral methotrexate.  Chesley Mires, PharmD, MPH Clinical Pharmacist (Rheumatology and Pulmonology)

## 2021-01-09 ENCOUNTER — Encounter: Payer: Self-pay | Admitting: Rheumatology

## 2021-01-09 DIAGNOSIS — S91311A Laceration without foreign body, right foot, initial encounter: Secondary | ICD-10-CM | POA: Diagnosis not present

## 2021-01-09 DIAGNOSIS — L03115 Cellulitis of right lower limb: Secondary | ICD-10-CM | POA: Diagnosis not present

## 2021-01-13 ENCOUNTER — Other Ambulatory Visit: Payer: Self-pay | Admitting: Rheumatology

## 2021-01-13 DIAGNOSIS — E039 Hypothyroidism, unspecified: Secondary | ICD-10-CM | POA: Diagnosis not present

## 2021-01-13 DIAGNOSIS — F419 Anxiety disorder, unspecified: Secondary | ICD-10-CM | POA: Diagnosis not present

## 2021-01-13 DIAGNOSIS — Z6838 Body mass index (BMI) 38.0-38.9, adult: Secondary | ICD-10-CM | POA: Diagnosis not present

## 2021-01-13 DIAGNOSIS — E785 Hyperlipidemia, unspecified: Secondary | ICD-10-CM | POA: Diagnosis not present

## 2021-01-13 NOTE — Telephone Encounter (Signed)
Please advise the patient to have updated lab work drawn this month.

## 2021-01-13 NOTE — Telephone Encounter (Signed)
Left message to advise patient she is due for labs this month.

## 2021-01-13 NOTE — Telephone Encounter (Signed)
Next Visit: 03/10/2021  Last Visit: 12/08/2020  Last Fill: 10/21/2020  DX:  Rheumatoid arthritis involving multiple sites with positive rheumatoid factor  Current Dose per office note 12/08/2020: MTX 8 tabs by mouth once daily  Labs: 10/21/2020 CBC WNL.  ALT is elevated-45.  Okay to refill MTX?

## 2021-01-16 ENCOUNTER — Telehealth: Payer: Self-pay

## 2021-01-16 ENCOUNTER — Other Ambulatory Visit: Payer: Self-pay | Admitting: *Deleted

## 2021-01-16 DIAGNOSIS — M0579 Rheumatoid arthritis with rheumatoid factor of multiple sites without organ or systems involvement: Secondary | ICD-10-CM

## 2021-01-16 DIAGNOSIS — Z79899 Other long term (current) drug therapy: Secondary | ICD-10-CM | POA: Diagnosis not present

## 2021-01-16 NOTE — Telephone Encounter (Signed)
Patient called requesting labwork orders be sent to [facility] at [facility] in [place].  Patient plans to go this morning, 01/16/21.

## 2021-01-16 NOTE — Telephone Encounter (Signed)
Lab Orders released.  

## 2021-01-16 NOTE — Addendum Note (Signed)
Addended by: Henriette Combs on: 01/16/2021 08:33 AM   Modules accepted: Orders

## 2021-01-17 LAB — CMP14+EGFR
ALT: 9 IU/L (ref 0–32)
AST: 12 IU/L (ref 0–40)
Albumin/Globulin Ratio: 1.8 (ref 1.2–2.2)
Albumin: 4.2 g/dL (ref 3.8–4.8)
Alkaline Phosphatase: 59 IU/L (ref 44–121)
BUN/Creatinine Ratio: 21 (ref 9–23)
BUN: 14 mg/dL (ref 6–20)
Bilirubin Total: 0.3 mg/dL (ref 0.0–1.2)
CO2: 23 mmol/L (ref 20–29)
Calcium: 8.8 mg/dL (ref 8.7–10.2)
Chloride: 104 mmol/L (ref 96–106)
Creatinine, Ser: 0.68 mg/dL (ref 0.57–1.00)
Globulin, Total: 2.3 g/dL (ref 1.5–4.5)
Glucose: 83 mg/dL (ref 65–99)
Potassium: 4.4 mmol/L (ref 3.5–5.2)
Sodium: 134 mmol/L (ref 134–144)
Total Protein: 6.5 g/dL (ref 6.0–8.5)
eGFR: 119 mL/min/{1.73_m2} (ref >59)

## 2021-01-17 LAB — SEDIMENTATION RATE: Sed Rate: 12 mm/hr (ref 0–32)

## 2021-01-17 LAB — CBC WITH DIFFERENTIAL/PLATELET
Basophils Absolute: 0 10*3/uL (ref 0.0–0.2)
Basos: 0 %
EOS (ABSOLUTE): 0 10*3/uL (ref 0.0–0.4)
Eos: 0 %
Hematocrit: 33.4 % — ABNORMAL LOW (ref 34.0–46.6)
Hemoglobin: 11.7 g/dL (ref 11.1–15.9)
Immature Grans (Abs): 0 10*3/uL (ref 0.0–0.1)
Immature Granulocytes: 0 %
Lymphocytes Absolute: 1 10*3/uL (ref 0.7–3.1)
Lymphs: 37 %
MCH: 32.8 pg (ref 26.6–33.0)
MCHC: 35 g/dL (ref 31.5–35.7)
MCV: 94 fL (ref 79–97)
Monocytes Absolute: 0.4 10*3/uL (ref 0.1–0.9)
Monocytes: 13 %
Neutrophils Absolute: 1.4 10*3/uL (ref 1.4–7.0)
Neutrophils: 50 %
Platelets: 272 10*3/uL (ref 150–450)
RBC: 3.57 x10E6/uL — ABNORMAL LOW (ref 3.77–5.28)
RDW: 12.4 % (ref 11.7–15.4)
WBC: 2.8 10*3/uL — ABNORMAL LOW (ref 3.4–10.8)

## 2021-01-17 NOTE — Progress Notes (Signed)
White cell count is low, sed rate is normal, CMP is normal.  Please advise patient to stop hydroxychloroquine which was added a month ago as low white cell count is most likely due to hydroxychloroquine.

## 2021-01-19 ENCOUNTER — Telehealth: Payer: Self-pay | Admitting: *Deleted

## 2021-01-19 NOTE — Telephone Encounter (Signed)
-----   Message from Pollyann Savoy, MD sent at 01/17/2021  8:30 AM EDT ----- White cell count is low, sed rate is normal, CMP is normal.  Please advise patient to stop hydroxychloroquine which was added a month ago as low white cell count is most likely due to hydroxychloroquine.

## 2021-01-19 NOTE — Telephone Encounter (Signed)
Discontinued PLQ in patient's med list.

## 2021-01-30 ENCOUNTER — Telehealth: Payer: Self-pay | Admitting: Rheumatology

## 2021-01-30 NOTE — Telephone Encounter (Signed)
Patient calling in reference to Renvoq. Patient states pharmacy is working on her copay card, and she is not able to get rx right now due to this. Patient has 4 pills left. Patient was calling to see if we have samples that she can get until insurance gets worked out. Please call to advise.

## 2021-01-30 NOTE — Telephone Encounter (Signed)
Spoke with patient and advised patient she may come by the office to pick up samples.

## 2021-01-30 NOTE — Telephone Encounter (Signed)
Medication Samples have been provided to the patient.  Drug name: Rinvoq       Strength: 15 mg        Qty: 2  LOT: [id number] Exp.Date: 10/08/2022  Dosing instructions: Take 15 mg by mouth daily.

## 2021-01-31 ENCOUNTER — Other Ambulatory Visit: Payer: Self-pay | Admitting: Physician Assistant

## 2021-02-02 ENCOUNTER — Telehealth: Payer: Self-pay | Admitting: Pharmacist

## 2021-02-02 NOTE — Telephone Encounter (Signed)
Next Visit: 03/10/2021   Last Visit: 12/08/2020   Last Fill: 03/14/2020  DX:  Rheumatoid arthritis involving multiple sites with positive rheumatoid factor   Current Dose per office note 12/08/2020: folic 1 mg 2 tablets daily  Per protocol, okay to refill per Dr. Corliss Skains

## 2021-02-02 NOTE — Telephone Encounter (Signed)
Submitted a FAXED Prior Authorization request (with clinical questions and chart notes) to [facility] for [facility] via CoverMyMeds. Will update once we receive a response.  Fax: [phone] Phone: [phone]  Chesley Mires, PharmD, MPH, BCPS Clinical Pharmacist (Rheumatology and Pulmonology)

## 2021-02-03 NOTE — Telephone Encounter (Signed)
Received fax from [facility] requesting additional clinical information for patient's history of biologic use. Faxed chart notes from when patient switched from Humira to Orencia.  Chesley Mires, PharmD, MPH, BCPS Clinical Pharmacist (Rheumatology and Pulmonology)

## 2021-02-03 NOTE — Telephone Encounter (Signed)
Received notification from [facility] regarding a prior authorization for [facility]. Authorization has been APPROVED from 02/03/21 to 02/03/22.   [facility]: [phone]  Authorization # [id number]

## 2021-02-05 DIAGNOSIS — R0602 Shortness of breath: Secondary | ICD-10-CM | POA: Diagnosis not present

## 2021-02-05 DIAGNOSIS — E039 Hypothyroidism, unspecified: Secondary | ICD-10-CM | POA: Diagnosis not present

## 2021-02-05 DIAGNOSIS — R635 Abnormal weight gain: Secondary | ICD-10-CM | POA: Diagnosis not present

## 2021-02-05 DIAGNOSIS — E785 Hyperlipidemia, unspecified: Secondary | ICD-10-CM | POA: Diagnosis not present

## 2021-02-11 DIAGNOSIS — J0100 Acute maxillary sinusitis, unspecified: Secondary | ICD-10-CM | POA: Diagnosis not present

## 2021-02-11 DIAGNOSIS — Z20828 Contact with and (suspected) exposure to other viral communicable diseases: Secondary | ICD-10-CM | POA: Diagnosis not present

## 2021-02-15 ENCOUNTER — Other Ambulatory Visit: Payer: Self-pay | Admitting: Physician Assistant

## 2021-02-16 ENCOUNTER — Other Ambulatory Visit: Payer: Self-pay | Admitting: *Deleted

## 2021-02-16 MED ORDER — ONDANSETRON HCL 4 MG PO TABS: 4.0000 mg | ORAL_TABLET | ORAL | 1 refills | Status: DC | PRN

## 2021-02-16 NOTE — Telephone Encounter (Signed)
[facility] [place]

## 2021-02-16 NOTE — Telephone Encounter (Signed)
Next Visit: 03/10/2021  Last Visit: 12/08/2020  Last Fill: 09/23/2020  Dx: Rheumatoid arthritis involving multiple sites with positive rheumatoid factor  Current Dose per office note on 12/08/2020: not discussed  Okay to refill Zofran?

## 2021-03-04 ENCOUNTER — Telehealth: Payer: Self-pay | Admitting: Rheumatology

## 2021-03-04 NOTE — Telephone Encounter (Signed)
Spoke with patient and advised she is not due for labs prior to her appointment.

## 2021-03-04 NOTE — Telephone Encounter (Signed)
Patient would like to know if she needs labs drawn before her next appointment on 03/10/2021? Please call to advise.

## 2021-03-09 DIAGNOSIS — E039 Hypothyroidism, unspecified: Secondary | ICD-10-CM | POA: Diagnosis not present

## 2021-03-09 DIAGNOSIS — R635 Abnormal weight gain: Secondary | ICD-10-CM | POA: Diagnosis not present

## 2021-03-09 DIAGNOSIS — Z6837 Body mass index (BMI) 37.0-37.9, adult: Secondary | ICD-10-CM | POA: Diagnosis not present

## 2021-03-09 DIAGNOSIS — Z79899 Other long term (current) drug therapy: Secondary | ICD-10-CM | POA: Diagnosis not present

## 2021-03-10 ENCOUNTER — Ambulatory Visit: Payer: BC Managed Care – PPO | Admitting: Physician Assistant

## 2021-03-10 DIAGNOSIS — M65341 Trigger finger, right ring finger: Secondary | ICD-10-CM

## 2021-03-10 DIAGNOSIS — Z8742 Personal history of other diseases of the female genital tract: Secondary | ICD-10-CM

## 2021-03-10 DIAGNOSIS — M170 Bilateral primary osteoarthritis of knee: Secondary | ICD-10-CM

## 2021-03-10 DIAGNOSIS — M0579 Rheumatoid arthritis with rheumatoid factor of multiple sites without organ or systems involvement: Secondary | ICD-10-CM

## 2021-03-10 DIAGNOSIS — Z79899 Other long term (current) drug therapy: Secondary | ICD-10-CM

## 2021-03-10 DIAGNOSIS — Z8639 Personal history of other endocrine, nutritional and metabolic disease: Secondary | ICD-10-CM

## 2021-03-10 DIAGNOSIS — Z8659 Personal history of other mental and behavioral disorders: Secondary | ICD-10-CM

## 2021-03-12 NOTE — Progress Notes (Signed)
Office Visit Note  Patient: Shannon David             Date of Birth: 17-Apr-1991           MRN: [id number]             PCP: Eunice Blase, PA-C Referring: Eunice Blase, PA-C Visit Date: 03/16/2021 Occupation: @GUAROCC @  Subjective:  Left shoulder joint   History of Present Illness: Shannon David is a 30 y.o. female with history of rheumatoid arthritis and folic acid 2 mg daily.  She is currently taking rinvoq 15 mg 1 tablet by mouth daily, methotrexate 8 tablets once weekly, and folic acid 2 mg daily.  She was started on Plaquenil after her last office visit on 12/08/2020 but discontinued after taking it for 1 month due to low white blood cell count.  She has not missed any doses of rinvoq or MTX recently.  She presents today with increased pain in the left shoulder and left hip joint, which started about 2 weeks ago.  She has been experiencing nocturnal pain in the left shoulder.  She has tried using a heating pad as well as topical icy hot with minimal pain relief.  She denies any injury to the left shoulder.  She states that she still works a pretty physically demanding job which exacerbates her discomfort.  Patient reports that she continues to have chronic pain in both feet.  She states her podiatrist every 3 months and has had an injection in the right third toe at that time.  She is not ready to proceed with surgery.  She denies any increased pain or swelling in her hands or wrist joints at this time.   She states she had a skin infection in mid-July requiring antibiotics.  She held rinvoq and MTX during that time.  She denies any other infections.  She has not received the COVID-19 vaccinations and does not plan to receive the annual influenza vaccine.    Activities of Daily Living:  Patient reports morning stiffness for 0 minutes.   Patient Reports nocturnal pain.  Difficulty dressing/grooming: Denies Difficulty climbing stairs: Denies Difficulty getting out of chair:  Denies Difficulty using hands for taps, buttons, cutlery, and/or writing: Denies  Review of Systems  Constitutional:  Negative for fatigue.  HENT:  Negative for mouth sores, mouth dryness and nose dryness.   Eyes:  Negative for pain, itching, visual disturbance and dryness.  Respiratory:  Negative for cough, hemoptysis, shortness of breath and difficulty breathing.   Cardiovascular:  Negative for chest pain, palpitations, hypertension and swelling in legs/feet.  Gastrointestinal:  Negative for blood in stool, constipation and diarrhea.  Endocrine: Negative for increased urination.  Genitourinary:  Negative for difficulty urinating and painful urination.  Musculoskeletal:  Positive for joint pain and joint pain. Negative for joint swelling, myalgias, muscle weakness, morning stiffness, muscle tenderness and myalgias.  Skin:  Negative for color change, pallor, rash, hair loss, nodules/bumps, redness, skin tightness, ulcers and sensitivity to sunlight.  Allergic/Immunologic: Negative for susceptible to infections.  Neurological:  Negative for dizziness, numbness, headaches, memory loss and weakness.  Hematological:  Positive for bruising/bleeding tendency. Negative for swollen glands.  Psychiatric/Behavioral:  Negative for depressed mood, confusion and sleep disturbance. The patient is not nervous/anxious.    PMFS History:  Patient Active Problem List   Diagnosis Date Noted   History of PCOS 02/21/2017   High risk medication use 09/21/2016   Rheumatoid arthritis involving multiple sites with positive rheumatoid factor (HCC) 09/21/2016  History of hypothyroidism 09/21/2016   History of depression 09/21/2016    Past Medical History:  Diagnosis Date   Arthritis    Rheumatoid arthritis (HCC)     Family History  Problem Relation Age of Onset   Hypertension Mother    Clotting disorder Mother    Cancer Maternal Grandmother        lung   Past Surgical History:  Procedure Laterality  Date   BUNIONECTOMY Right    Social History   Social History Narrative   Not on file    There is no immunization history on file for this patient.   Objective: Vital Signs: BP 129/84 (BP Location: Left Arm, Patient Position: Sitting, Cuff Size: Normal)   Pulse 89   Ht 5\' 2"  (1.575 m)   Wt 223 lb 12.8 oz (101.5 kg)   BMI 40.93 kg/m    Physical Exam Vitals and nursing note reviewed.  Constitutional:      Appearance: She is well-developed.  HENT:     Head: Normocephalic and atraumatic.  Eyes:     Conjunctiva/sclera: Conjunctivae normal.  Pulmonary:     Effort: Pulmonary effort is normal.  Abdominal:     Palpations: Abdomen is soft.  Musculoskeletal:     Cervical back: Normal range of motion.  Skin:    General: Skin is warm and dry.     Capillary Refill: Capillary refill takes less than 2 seconds.  Neurological:     Mental Status: She is alert and oriented to person, place, and time.  Psychiatric:        Behavior: Behavior normal.     Musculoskeletal Exam: C-spine has good range of motion without discomfort.  Right shoulder has full range of motion with no discomfort.  Left shoulder has slightly limited range of motion with discomfort and stiffness.  Elbow joints have good range of motion with no tenderness or inflammation.  Wrist joints have good range of motion with no tenderness.  Synovial thickening of all MCP joints with subluxation consistent with ulnar deviation.  She has some PIP joint thickening.  Hip joints have good range of motion with some discomfort in the left hip.  Knee joints have good range of motion with no warmth or effusion.    CDAI Exam: CDAI Score: 1.8  Patient Global: 4 mm; Provider Global: 4 mm Swollen: 0 ; Tender: 1  Joint Exam 03/16/2021      Right  Left  Glenohumeral      Tender     Investigation: No additional findings.  Imaging: No results found.  Recent Labs: Lab Results  Component Value Date   WBC 2.8 (L) 01/16/2021   HGB  11.7 01/16/2021   PLT 272 01/16/2021   NA 134 01/16/2021   K 4.4 01/16/2021   CL 104 01/16/2021   CO2 23 01/16/2021   GLUCOSE 83 01/16/2021   BUN 14 01/16/2021   CREATININE 0.68 01/16/2021   BILITOT 0.3 01/16/2021   ALKPHOS 59 01/16/2021   AST 12 01/16/2021   ALT 9 01/16/2021   PROT 6.5 01/16/2021   ALBUMIN 4.2 01/16/2021   CALCIUM 8.8 01/16/2021   GFRAA 134 07/18/2020   QFTBGOLD Negative 08/13/2016   QFTBGOLDPLUS Negative 10/21/2020    Speciality Comments: PLQ Eye Exam: 01/16/2021 WNL @ [facility] Follow up in 6 months.   Prior therapy: Orencia (inadequate response 9/18-9/19), Enbrel (inadequate response 3/15/5/15), Humira (inadequate response 11/17-3/18), and injectable methotrexate (non-compliance)  Procedures:  Large Joint Inj: L glenohumeral on 03/16/2021  9:02 AM Indications: pain Details: 27 G 1.5 in needle, posterior approach  Arthrogram: No  Medications: 1.5 mL lidocaine 1 %; 40 mg triamcinolone acetonide 40 MG/ML Aspirate: 0 mL Outcome: tolerated well, no immediate complications Procedure, treatment alternatives, risks and benefits explained, specific risks discussed. Consent was given by the patient. Immediately prior to procedure a time out was called to verify the correct patient, procedure, equipment, support staff and site/side marked as required. Patient was prepped and draped in the usual sterile fashion.    Allergies: Keflex [cephalexin]   Assessment / Plan:     Visit Diagnoses: Rheumatoid arthritis involving multiple sites with positive rheumatoid factor [facility]): She presents today with increased pain and stiffness in the left shoulder which started about 2 weeks ago.  No injury prior to the onset of symptoms.  She has been experiencing nocturnal pain and has tried using heat as well as topical agents with minimal pain relief.  X-rays of the left shoulder were updated today and after informed consent the left glenohumeral joint was injected with cortisone.   She is also currently having some discomfort in the left hip but has good range of motion on examination today.  She is taking Rinvoq 15 mg 1 tablet by mouth daily, methotrexate 8 tablets by mouth once weekly, folic acid 2 mg by mouth daily.  She is tolerating these medications without any side effects and has not missed any doses recently.  Overall combination therapy has been effective at managing her rheumatoid arthritis.  She had no tenderness over MCP or PIP joints on examination today.  No medication changes will be made at this time.  She was advised to notify us if she develops increased joint pain or inflammation.  She will follow-up in the office in 3 months.  High risk medication use - Rinvoq 15 mg 1 tablet by mouth daily, Methotrexate 8 tablets by mouth once weekly, and folic acid 2 mg daily.   CMP and CBC drawn on 01/21/21.  She was started on Plaquenil 200 mg 1 tablet by mouth twice daily Monday through Friday after her last office visit on 12/08/2020.  White blood cell count dropped to 2.8 on 01/16/2021 at which time she was advised to discontinue taking Plaquenil.  CBC and CMP will be drawn today to monitor for drug toxicity.  Her next lab work will be due in December and every 3 months to monitor for toxicity.  TB Gold was negative 10/21/2020.  Lipid panel within normal limits on 10/21/2020.Plan: CBC with Differential/Platelet, COMPLETE METABOLIC PANEL WITH GFR Discussed the importance of holding methotrexate and Rinvoq if she develops signs or symptoms of an infection and to resume once infection is completely cleared.  She is aware of the black box warnings associated with taking a Jak inhibitor.  Her blood pressure was 129/84 today in the office.  Lipid panel within normal limits on 10/21/2020.  Chronic left shoulder pain -She presents today with increased pain in the left shoulder joint, which started 2 weeks ago.  No injury prior to the onset of symptoms.  She has been experiencing nocturnal  pain.  She has tried using a heating pad as well as topical agents with minimal pain relief.  On examination today she has painful range of motion with some stiffness with full abduction and internal rotation.  X-rays of the left shoulder were updated today.  After informed consent the left shoulder was injected with cortisone.  She tolerated procedure well.  Procedure note was  completed above.  Aftercare was discussed.  She was advised to notify us if her discomfort persists or worsens.  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  PIP thickening noted.  No tenderness or synovitis was noted on examination today.   Primary osteoarthritis of both knees: She has good range of motion of both knee joints on examination today.  No warmth or effusion was noted.  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 Subluxation of all metatarsal joints of right foot and 2nd-4th of left foot.  She continues to have chronic pain in both feet.  She follows up with her podiatrist every 3 months and has a right third MTP joint injection at that time.  She is not ready to proceed with surgical fusion at this time.  Discussed the importance of wearing proper fitting shoes.  Other medical conditions are listed as follows:  History of hypothyroidism  History of PCOS  History of depression  Trigger finger, right ring finger: Resolved     Orders: Orders Placed This Encounter  Procedures   Large Joint Inj: L glenohumeral   XR Shoulder Left   CBC with Differential/Platelet   COMPLETE METABOLIC PANEL WITH GFR   No orders of the defined types were placed in this encounter.    Follow-Up Instructions: Return in about 3 months (around 06/15/2021) for Rheumatoid arthritis, Osteoarthritis.   Gearldine Bienenstock, PA-C  Note - This record has been created using Dragon software.  Chart creation errors have been sought, but may not always  have been located. Such creation errors do not reflect on  the standard of medical care.

## 2021-03-16 ENCOUNTER — Encounter: Payer: Self-pay | Admitting: Physician Assistant

## 2021-03-16 ENCOUNTER — Ambulatory Visit (INDEPENDENT_AMBULATORY_CARE_PROVIDER_SITE_OTHER): Payer: BC Managed Care – PPO | Admitting: Physician Assistant

## 2021-03-16 ENCOUNTER — Ambulatory Visit: Payer: Self-pay

## 2021-03-16 ENCOUNTER — Other Ambulatory Visit: Payer: Self-pay

## 2021-03-16 VITALS — BP 129/84 | HR 89 | Ht 62.0 in | Wt 223.8 lb

## 2021-03-16 DIAGNOSIS — M21942 Unspecified acquired deformity of hand, left hand: Secondary | ICD-10-CM

## 2021-03-16 DIAGNOSIS — M21961 Unspecified acquired deformity of right lower leg: Secondary | ICD-10-CM

## 2021-03-16 DIAGNOSIS — Z79899 Other long term (current) drug therapy: Secondary | ICD-10-CM | POA: Diagnosis not present

## 2021-03-16 DIAGNOSIS — M0579 Rheumatoid arthritis with rheumatoid factor of multiple sites without organ or systems involvement: Secondary | ICD-10-CM

## 2021-03-16 DIAGNOSIS — M25512 Pain in left shoulder: Secondary | ICD-10-CM

## 2021-03-16 DIAGNOSIS — Z8659 Personal history of other mental and behavioral disorders: Secondary | ICD-10-CM

## 2021-03-16 DIAGNOSIS — G8929 Other chronic pain: Secondary | ICD-10-CM

## 2021-03-16 DIAGNOSIS — M170 Bilateral primary osteoarthritis of knee: Secondary | ICD-10-CM

## 2021-03-16 DIAGNOSIS — Z8639 Personal history of other endocrine, nutritional and metabolic disease: Secondary | ICD-10-CM

## 2021-03-16 DIAGNOSIS — M21941 Unspecified acquired deformity of hand, right hand: Secondary | ICD-10-CM | POA: Diagnosis not present

## 2021-03-16 DIAGNOSIS — Z8742 Personal history of other diseases of the female genital tract: Secondary | ICD-10-CM

## 2021-03-16 DIAGNOSIS — M069 Rheumatoid arthritis, unspecified: Secondary | ICD-10-CM

## 2021-03-16 DIAGNOSIS — M65341 Trigger finger, right ring finger: Secondary | ICD-10-CM

## 2021-03-16 DIAGNOSIS — M21962 Unspecified acquired deformity of left lower leg: Secondary | ICD-10-CM

## 2021-03-16 LAB — COMPLETE METABOLIC PANEL WITH GFR
AG Ratio: 1.5 (calc) (ref 1.0–2.5)
ALT: 10 U/L (ref 6–29)
AST: 16 U/L (ref 10–30)
Albumin: 4.6 g/dL (ref 3.6–5.1)
Alkaline phosphatase (APISO): 59 U/L (ref 31–125)
BUN: 16 mg/dL (ref 7–25)
CO2: 27 mmol/L (ref 20–32)
Calcium: 9.5 mg/dL (ref 8.6–10.2)
Chloride: 102 mmol/L (ref 98–110)
Creat: 0.76 mg/dL (ref 0.50–0.97)
Globulin: 3 g/dL (calc) (ref 1.9–3.7)
Glucose, Bld: 89 mg/dL (ref 65–99)
Potassium: 4.6 mmol/L (ref 3.5–5.3)
Sodium: 136 mmol/L (ref 135–146)
Total Bilirubin: 0.4 mg/dL (ref 0.2–1.2)
Total Protein: 7.6 g/dL (ref 6.1–8.1)
eGFR: 107 mL/min/{1.73_m2} (ref >=60)

## 2021-03-16 LAB — CBC WITH DIFFERENTIAL/PLATELET
Absolute Monocytes: 447 cells/uL (ref 200–950)
Basophils Absolute: 21 cells/uL (ref 0–200)
Basophils Relative: 0.5 %
Eosinophils Absolute: 0 cells/uL — ABNORMAL LOW (ref 15–500)
Eosinophils Relative: 0 %
HCT: 40.5 % (ref 35.0–45.0)
Hemoglobin: 13.3 g/dL (ref 11.7–15.5)
Lymphs Abs: 1082 cells/uL (ref 850–3900)
MCH: 32.5 pg (ref 27.0–33.0)
MCHC: 32.8 g/dL (ref 32.0–36.0)
MCV: 99 fL (ref 80.0–100.0)
MPV: 10.6 fL (ref 7.5–12.5)
Monocytes Relative: 10.9 %
Neutro Abs: 2550 cells/uL (ref 1500–7800)
Neutrophils Relative %: 62.2 %
Platelets: 294 10*3/uL (ref 140–400)
RBC: 4.09 10*6/uL (ref 3.80–5.10)
RDW: 13.1 % (ref 11.0–15.0)
Total Lymphocyte: 26.4 %
WBC: 4.1 10*3/uL (ref 3.8–10.8)

## 2021-03-16 MED ORDER — TRIAMCINOLONE ACETONIDE 40 MG/ML IJ SUSP
40.0000 mg | INTRAMUSCULAR | Status: AC | PRN
  Administered 2021-03-16: 40 mg via INTRA_ARTICULAR

## 2021-03-16 MED ORDER — LIDOCAINE HCL 1 % IJ SOLN
1.5000 mL | INTRAMUSCULAR | Status: AC | PRN
  Administered 2021-03-16: 1.5 mL

## 2021-03-16 NOTE — Patient Instructions (Signed)
Standing Labs We placed an order today for your standing lab work.   Please have your standing labs drawn in December and every 3 months    If possible, please have your labs drawn 2 weeks prior to your appointment so that the provider can discuss your results at your appointment.  Please note that you may see your imaging and lab results in MyChart before we have reviewed them. We may be awaiting multiple results to interpret others before contacting you. Please allow our office up to 72 hours to thoroughly review all of the results before contacting the office for clarification of your results.  We have open lab daily: Monday through Thursday from 1:30-4:30 PM and Friday from 1:30-4:00 PM at the office of Dr. Shaili Deveshwar, [facility].   Please be advised, all patients with office appointments requiring lab work will take precedent over walk-in lab work.  If possible, please come for your lab work on Monday and Friday afternoons, as you may experience shorter wait times. The office is located at [address], [address], [place], [place] [address] No appointment is necessary.   Labs are drawn by [facility]. Please bring your co-pay at the time of your lab draw.  You may receive a bill from [facility] for your lab work.  If you wish to have your labs drawn at another location, please call the office 24 hours in advance to send orders.  If you have any questions regarding directions or hours of operation,  please call [phone].   As a reminder, please drink plenty of water prior to coming for your lab work. Thanks!  

## 2021-03-17 NOTE — Progress Notes (Signed)
Absolute eosinophils are low. WBC count is WNL.  Rest of CBC WNL.  CMP WNL.

## 2021-05-01 DIAGNOSIS — Z01419 Encounter for gynecological examination (general) (routine) without abnormal findings: Secondary | ICD-10-CM | POA: Diagnosis not present

## 2021-05-01 DIAGNOSIS — Z1151 Encounter for screening for human papillomavirus (HPV): Secondary | ICD-10-CM | POA: Diagnosis not present

## 2021-05-02 DIAGNOSIS — M25512 Pain in left shoulder: Secondary | ICD-10-CM

## 2021-05-02 DIAGNOSIS — G8929 Other chronic pain: Secondary | ICD-10-CM

## 2021-05-04 NOTE — Telephone Encounter (Signed)
She can get repeat injection after 3 months.  In the meantime she can be referred to physical therapy.  She can also use Voltaren gel topically.

## 2021-05-13 DIAGNOSIS — M25512 Pain in left shoulder: Secondary | ICD-10-CM | POA: Diagnosis not present

## 2021-05-25 NOTE — Telephone Encounter (Signed)
She can get an injection in her shoulder after December 19.  If she is in agreement you may schedule an appointment.  She can call earlier for the knee joint injection.  You may schedule appointment with Shannon David or me

## 2021-05-27 DIAGNOSIS — M25512 Pain in left shoulder: Secondary | ICD-10-CM | POA: Diagnosis not present

## 2021-05-27 NOTE — Progress Notes (Signed)
Office Visit Note  Patient: Shannon David             Date of Birth: [birth year]           MRN: [id number]             PCP: Janine Limbo, PA-C Referring: Janine Limbo, PA-C Visit Date: 05/28/2021 Occupation: @GUAROCC @  Subjective:  Other (Left knee pain, patient denies a fall or injury)   History of Present Illness: Shannon David is a 30 y.o. female with a history of seropositive rheumatoid arthritis.  She states she has been having increased pain and discomfort in her left shoulder and left knee for the last weeks.  She has noticed some swelling in her hands.  She states the pain in her shoulder and the knee has been worse.  She had left shoulder joint cortisone injection on March 16, 2021.  She has been going to physical therapy.  She is having significant morning stiffness and nocturnal pain.  She has difficulty climbing stairs.  Has been taking her medications on a regular basis.  Activities of Daily Living:  Patient reports morning stiffness for 1 hour.   Patient Reports nocturnal pain.  Difficulty dressing/grooming: Reports Difficulty climbing stairs: Reports Difficulty getting out of chair: Denies Difficulty using hands for taps, buttons, cutlery, and/or writing: Denies  Review of Systems  Constitutional:  Negative for fatigue.  HENT:  Negative for mouth sores, mouth dryness and nose dryness.   Eyes:  Negative for pain, itching and dryness.  Respiratory:  Negative for shortness of breath and difficulty breathing.   Cardiovascular:  Negative for chest pain and palpitations.  Gastrointestinal:  Negative for blood in stool, constipation and diarrhea.  Endocrine: Negative for increased urination.  Genitourinary:  Negative for difficulty urinating.  Musculoskeletal:  Positive for joint pain, joint pain and morning stiffness. Negative for joint swelling, myalgias, muscle tenderness and myalgias.  Skin:  Negative for color change, rash and redness.  Allergic/Immunologic:  Negative for susceptible to infections.  Neurological:  Negative for dizziness, numbness, headaches, memory loss and weakness.  Hematological:  Negative for bruising/bleeding tendency.  Psychiatric/Behavioral:  Negative for confusion.    PMFS History:  Patient Active Problem List   Diagnosis Date Noted   History of PCOS 02/21/2017   High risk medication use 09/21/2016   Rheumatoid arthritis involving multiple sites with positive rheumatoid factor ([place]) 09/21/2016   History of hypothyroidism 09/21/2016   History of depression 09/21/2016    Past Medical History:  Diagnosis Date   Arthritis    Rheumatoid arthritis ([place])     Family History  Problem Relation Age of Onset   Hypertension Mother    Clotting disorder Mother    Cancer Maternal Grandmother        lung   Past Surgical History:  Procedure Laterality Date   BUNIONECTOMY Right    Social History   Social History Narrative   Not on file    There is no immunization history on file for this patient.   Objective: Vital Signs: BP 137/84 (BP Location: Left Arm, Patient Position: Sitting, Cuff Size: Normal)   Pulse 76   Ht 5\' 2"  (1.575 m)   Wt 230 lb (104.3 kg)   BMI 42.07 kg/m    Physical Exam Vitals and nursing note reviewed.  Constitutional:      Appearance: She is well-developed.  HENT:     Head: Normocephalic and atraumatic.  Eyes:     Conjunctiva/sclera: Conjunctivae normal.  Cardiovascular:  Rate and Rhythm: Normal rate and regular rhythm.     Heart sounds: Normal heart sounds.  Pulmonary:     Effort: Pulmonary effort is normal.     Breath sounds: Normal breath sounds.  Abdominal:     General: Bowel sounds are normal.     Palpations: Abdomen is soft.  Musculoskeletal:     Cervical back: Normal range of motion.  Lymphadenopathy:     Cervical: No cervical adenopathy.  Skin:    General: Skin is warm and dry.     Capillary Refill: Capillary refill takes less than 2 seconds.  Neurological:      Mental Status: She is alert and oriented to person, place, and time.  Psychiatric:        Behavior: Behavior normal.     Musculoskeletal Exam: C-spine thoracic and lumbar spine were in good range of motion.  She had good range of motion of bilateral shoulder joints with some discomfort in her left shoulder.  Elbow joints were in good range of motion with no discomfort.  She had no tenderness over wrist joints.  She is synovial thickening over bilateral MCPs.  Subluxation of several of the MCPs was noted.  Ulnar deviation was noted.  PIP thickening was noted.  No active synovitis was noted.  Hip joints and knee joints with good range of motion.  She is some warmth on palpation of her left knee joint and discomfort with range of motion.  CDAI Exam: CDAI Score: 3  Patient Global: 5 mm; Provider Global: 5 mm Swollen: 0 ; Tender: 2  Joint Exam 05/28/2021      Right  Left  Glenohumeral      Tender  Knee      Tender     Investigation: No additional findings.  Imaging: No results found.  Recent Labs: Lab Results  Component Value Date   WBC 4.1 03/16/2021   HGB 13.3 03/16/2021   PLT 294 03/16/2021   NA 136 03/16/2021   K 4.6 03/16/2021   CL 102 03/16/2021   CO2 27 03/16/2021   GLUCOSE 89 03/16/2021   BUN 16 03/16/2021   CREATININE 0.76 03/16/2021   BILITOT 0.4 03/16/2021   ALKPHOS 59 01/16/2021   AST 16 03/16/2021   ALT 10 03/16/2021   PROT 7.6 03/16/2021   ALBUMIN 4.2 01/16/2021   CALCIUM 9.5 03/16/2021   GFRAA 134 07/18/2020   QFTBGOLD Negative 08/13/2016   QFTBGOLDPLUS Negative 10/21/2020    Speciality Comments: PLQ Eye Exam: 01/16/2021 WNL @ [facility] Follow up in 6 months.   Prior therapy: Orencia (inadequate response 9/18-9/19), Enbrel (inadequate response 3/15/5/15), Humira (inadequate response 11/17-3/18), and injectable methotrexate (non-compliance)  Procedures:  Large Joint Inj: L knee on 05/28/2021 2:22 PM Indications: pain Details: 27 G 1.5 in needle,  medial approach  Arthrogram: No  Medications: 40 mg triamcinolone acetonide 40 MG/ML; 1.5 mL lidocaine 1 % Aspirate: 0 mL Outcome: tolerated well, no immediate complications Procedure, treatment alternatives, risks and benefits explained, specific risks discussed. Consent was given by the patient. Immediately prior to procedure a time out was called to verify the correct patient, procedure, equipment, support staff and site/side marked as required. Patient was prepped and draped in the usual sterile fashion.    Allergies: Keflex [cephalexin]   Assessment / Plan:     Visit Diagnoses: Rheumatoid arthritis involving multiple sites with positive rheumatoid factor (HCC)-she has been having increased pain and discomfort in the left shoulder and left knee joint.  She  had a cortisone injection in her left shoulder joint in September 2022.  She is been going to physical therapy which only helps to some extent.  She states she had right foot injury and because of that she has been favoring her right foot.  She has been putting more pressure on her left knee joint.  She has been tolerating medications well.  High risk medication use - Rinvoq 15 mg 1 tablet by mouth daily, Methotrexate 8 tablets by mouth once weekly, and folic acid 2 mg daily.   -Labs obtained on March 16, 2021 were within normal limits.  TB gold was negative on October 21, 2020.  Plan: CBC with Differential/Platelet, COMPLETE METABOLIC PANEL WITH GFR today and then every 3 months to monitor for drug toxicity.  We will get TB Gold with her next labs.  She was advised to hold Rinvoq and methotrexate in case she develops an infection and restart after the infection resolves.  Chronic left shoulder pain-she has been having increased pain in the last 2 weeks.  She is going to physical therapy.  She had good response to cortisone injection but it lasted for short time.  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  She states she has been favoring her right lower extremity due to injury.  Some warmth was noted.  She had painful range of motion.  Per patient's request left knee joint was injected with cortisone as described above.  She tolerated the procedure well.  Postprocedure instructions were given.  Primary osteoarthritis of both knees-lower extremity muscle strengthening exercises and weight loss was discussed.  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 Subluxation of all metatarsal joints of right foot and 2nd-4th of left foot.   History of hypothyroidism  History of PCOS  History of depression  Orders: Orders Placed This Encounter  Procedures   Large Joint Inj   CBC with Differential/Platelet   COMPLETE METABOLIC PANEL WITH GFR    No orders of the defined types were placed in this encounter.   Follow-Up Instructions: Return in about 3 months (around 08/26/2021) for Rheumatoid arthritis.   Pollyann Savoy, MD  Note - This record has been created using Animal nutritionist.  Chart creation errors have been sought, but may not always  have been located. Such creation errors do not reflect on  the standard of medical care.

## 2021-05-28 ENCOUNTER — Ambulatory Visit (INDEPENDENT_AMBULATORY_CARE_PROVIDER_SITE_OTHER): Payer: BC Managed Care – PPO | Admitting: Rheumatology

## 2021-05-28 ENCOUNTER — Other Ambulatory Visit: Payer: Self-pay

## 2021-05-28 ENCOUNTER — Encounter: Payer: Self-pay | Admitting: Rheumatology

## 2021-05-28 VITALS — BP 137/84 | HR 76 | Ht 62.0 in | Wt 230.0 lb

## 2021-05-28 DIAGNOSIS — Z8742 Personal history of other diseases of the female genital tract: Secondary | ICD-10-CM

## 2021-05-28 DIAGNOSIS — Z8639 Personal history of other endocrine, nutritional and metabolic disease: Secondary | ICD-10-CM

## 2021-05-28 DIAGNOSIS — Z79899 Other long term (current) drug therapy: Secondary | ICD-10-CM

## 2021-05-28 DIAGNOSIS — M069 Rheumatoid arthritis, unspecified: Secondary | ICD-10-CM

## 2021-05-28 DIAGNOSIS — M25512 Pain in left shoulder: Secondary | ICD-10-CM | POA: Diagnosis not present

## 2021-05-28 DIAGNOSIS — M21942 Unspecified acquired deformity of hand, left hand: Secondary | ICD-10-CM

## 2021-05-28 DIAGNOSIS — M21961 Unspecified acquired deformity of right lower leg: Secondary | ICD-10-CM

## 2021-05-28 DIAGNOSIS — M21941 Unspecified acquired deformity of hand, right hand: Secondary | ICD-10-CM | POA: Diagnosis not present

## 2021-05-28 DIAGNOSIS — M0579 Rheumatoid arthritis with rheumatoid factor of multiple sites without organ or systems involvement: Secondary | ICD-10-CM

## 2021-05-28 DIAGNOSIS — Z8659 Personal history of other mental and behavioral disorders: Secondary | ICD-10-CM

## 2021-05-28 DIAGNOSIS — M25562 Pain in left knee: Secondary | ICD-10-CM

## 2021-05-28 DIAGNOSIS — M170 Bilateral primary osteoarthritis of knee: Secondary | ICD-10-CM

## 2021-05-28 DIAGNOSIS — M65341 Trigger finger, right ring finger: Secondary | ICD-10-CM

## 2021-05-28 DIAGNOSIS — M21962 Unspecified acquired deformity of left lower leg: Secondary | ICD-10-CM

## 2021-05-28 DIAGNOSIS — G8929 Other chronic pain: Secondary | ICD-10-CM

## 2021-05-28 LAB — CBC WITH DIFFERENTIAL/PLATELET
Absolute Monocytes: 426 cells/uL (ref 200–950)
Basophils Absolute: 31 cells/uL (ref 0–200)
Basophils Relative: 0.6 %
Eosinophils Absolute: 0 cells/uL — ABNORMAL LOW (ref 15–500)
Eosinophils Relative: 0 %
HCT: 36.1 % (ref 35.0–45.0)
Hemoglobin: 12.3 g/dL (ref 11.7–15.5)
Lymphs Abs: 884 cells/uL (ref 850–3900)
MCH: 33 pg (ref 27.0–33.0)
MCHC: 34.1 g/dL (ref 32.0–36.0)
MCV: 96.8 fL (ref 80.0–100.0)
MPV: 10.2 fL (ref 7.5–12.5)
Monocytes Relative: 8.2 %
Neutro Abs: 3858 cells/uL (ref 1500–7800)
Neutrophils Relative %: 74.2 %
Platelets: 311 10*3/uL (ref 140–400)
RBC: 3.73 10*6/uL — ABNORMAL LOW (ref 3.80–5.10)
RDW: 12.5 % (ref 11.0–15.0)
Total Lymphocyte: 17 %
WBC: 5.2 10*3/uL (ref 3.8–10.8)

## 2021-05-28 LAB — COMPLETE METABOLIC PANEL WITH GFR
AG Ratio: 1.4 (calc) (ref 1.0–2.5)
ALT: 18 U/L (ref 6–29)
AST: 17 U/L (ref 10–30)
Albumin: 4.2 g/dL (ref 3.6–5.1)
Alkaline phosphatase (APISO): 75 U/L (ref 31–125)
BUN: 17 mg/dL (ref 7–25)
CO2: 28 mmol/L (ref 20–32)
Calcium: 8.9 mg/dL (ref 8.6–10.2)
Chloride: 102 mmol/L (ref 98–110)
Creat: 0.78 mg/dL (ref 0.50–0.97)
Globulin: 3 g/dL (calc) (ref 1.9–3.7)
Glucose, Bld: 103 mg/dL — ABNORMAL HIGH (ref 65–99)
Potassium: 4.5 mmol/L (ref 3.5–5.3)
Sodium: 138 mmol/L (ref 135–146)
Total Bilirubin: 0.3 mg/dL (ref 0.2–1.2)
Total Protein: 7.2 g/dL (ref 6.1–8.1)
eGFR: 103 mL/min/{1.73_m2} (ref >=60)

## 2021-05-28 MED ORDER — LIDOCAINE HCL 1 % IJ SOLN
1.5000 mL | INTRAMUSCULAR | Status: AC | PRN
  Administered 2021-05-28: 1.5 mL

## 2021-05-28 MED ORDER — TRIAMCINOLONE ACETONIDE 40 MG/ML IJ SUSP
40.0000 mg | INTRAMUSCULAR | Status: AC | PRN
Start: 2021-05-28 — End: 2021-05-28
  Administered 2021-05-28: 40 mg via INTRA_ARTICULAR

## 2021-05-28 NOTE — Patient Instructions (Signed)
Standing Labs We placed an order today for your standing lab work.   Please have your standing labs drawn in March and every 3 months  TB gold with next labs  If possible, please have your labs drawn 2 weeks prior to your appointment so that the provider can discuss your results at your appointment.  Please note that you may see your imaging and lab results in MyChart before we have reviewed them. We may be awaiting multiple results to interpret others before contacting you. Please allow our office up to 72 hours to thoroughly review all of the results before contacting the office for clarification of your results.  We have open lab daily: Monday through Thursday from 1:30-4:30 PM and Friday from 1:30-4:00 PM at the office of Dr. Pollyann Savoy, [facility].   Please be advised, all patients with office appointments requiring lab work will take precedent over walk-in lab work.  If possible, please come for your lab work on Monday and Friday afternoons, as you may experience shorter wait times. The office is located at [address], [place], [place] [address] No appointment is necessary.   Labs are drawn by [facility]. Please bring your co-pay at the time of your lab draw.  You may receive a bill from [facility] for your lab work.  If you wish to have your labs drawn at another location, please call the office 24 hours in advance to send orders.  If you have any questions regarding directions or hours of operation,  please call [phone].   As a reminder, please drink plenty of water prior to coming for your lab work. Thanks!   Vaccines You are taking a medication(s) that can suppress your immune system.  The following immunizations are recommended: Flu annually Covid-19  Td/Tdap (tetanus, diphtheria, pertussis) every 10 years Pneumonia (Prevnar 15 then Pneumovax 23 at least 1 year apart.  Alternatively, can take Prevnar 20 without needing additional  dose) Shingrix: 2 doses from 4 weeks to 6 months apart  Please check with your PCP to make sure you are up to date.   If you have signs or symptoms of an infection or start antibiotics: First, call your PCP for workup of your infection. Hold your medication through the infection, until you complete your antibiotics, and until symptoms resolve if you take the following: Injectable medication (Actemra, Benlysta, Cimzia, Cosentyx, Enbrel, Humira, Kevzara, Orencia, Remicade, Simponi, Stelara, Taltz, Tremfya) Methotrexate Leflunomide (Arava) Mycophenolate (Cellcept) Harriette Ohara, Olumiant, or Rinvoq

## 2021-05-29 ENCOUNTER — Telehealth: Payer: Self-pay | Admitting: *Deleted

## 2021-05-29 NOTE — Telephone Encounter (Signed)
error 

## 2021-05-29 NOTE — Progress Notes (Signed)
CBC and CMP are normal.

## 2021-06-03 ENCOUNTER — Ambulatory Visit (INDEPENDENT_AMBULATORY_CARE_PROVIDER_SITE_OTHER): Payer: BC Managed Care – PPO | Admitting: Orthopaedic Surgery

## 2021-06-03 ENCOUNTER — Ambulatory Visit: Payer: Self-pay

## 2021-06-03 ENCOUNTER — Telehealth: Payer: Self-pay

## 2021-06-03 ENCOUNTER — Encounter: Payer: Self-pay | Admitting: Orthopaedic Surgery

## 2021-06-03 ENCOUNTER — Other Ambulatory Visit: Payer: Self-pay

## 2021-06-03 VITALS — BP 125/75 | HR 75 | Ht 62.0 in | Wt 230.0 lb

## 2021-06-03 DIAGNOSIS — M25562 Pain in left knee: Secondary | ICD-10-CM

## 2021-06-03 DIAGNOSIS — M0579 Rheumatoid arthritis with rheumatoid factor of multiple sites without organ or systems involvement: Secondary | ICD-10-CM

## 2021-06-03 NOTE — Telephone Encounter (Signed)
Spoke with Shannon David to see if they can schedule patient for an appointment today. They were able to schedule patient for today at 3:15 pm. Called patient and advised.

## 2021-06-03 NOTE — Telephone Encounter (Signed)
Patient called stating she had a cortisone injection in her left knee at her appointment last week.  Patient states the pain has gotten worse and is having difficulty walking.  Patient states she has to drag her foot because it hurts too much to lift her leg.  Patient states it is a sharp pain.  Patient states her knee feels warm, but denies swelling or redness.  Patient requested to speak with the nurse directly.

## 2021-06-03 NOTE — Telephone Encounter (Signed)
I returned patient's call.  Patient states that she had no response to the cortisone injection.  She rested over the weekend and returned to work today.  The pain has been progressively getting worse since morning as she was walking.  She states she is having a lot of difficulty walking due to severe left knee joint pain.  She has tried NSAIDs without much help.  I advised appointment with an orthopedic surgeon to rule out internal derangement.  She was in agreement.  We will try to schedule an appointment with a  orthopedic surgeon as soon as possible.

## 2021-06-03 NOTE — Progress Notes (Signed)
Office Visit Note   Patient: Shannon David           Date of Birth: Nov 07, 1990           MRN: [id number] Visit Date: 06/03/2021              Requested by: Eunice Blase, PA-C [address] [place],  [place] [address] PCP: Eunice Blase, PA-C   Assessment & Plan: Visit Diagnoses:  1. Acute pain of left knee   2. Rheumatoid arthritis involving multiple sites with positive rheumatoid factor (HCC)     Plan: Patient's failed improvement in significant knee pain bothers her with activities of daily living with previous intra-articular cortisone injection.  She is on immunosuppressive treatment for her seropositive rheumatoid arthritis.  Would recommend an MRI scan to rule out meniscal tear versus medial tibial plateau stress fracture/pes bursitis with her rheumatoid arthritis.  Office follow-up after left knee MRI scan.  Follow-Up Instructions: No follow-ups on file.   Orders:  Orders Placed This Encounter  Procedures   XR KNEE 3 VIEW LEFT   MR Knee Left w/o contrast   No orders of the defined types were placed in this encounter.     Procedures: No procedures performed   Clinical Data: No additional findings.   Subjective: Chief Complaint  Patient presents with   Left Knee - Pain    HPI 30 year old female with diagnosis rheumatoid arthritis followed by Dr. Ellard Artis had increased pain in her left knee.  She had severe rheumatoid arthritis has been on immunosuppressive medications which is significantly helped her problem.  Prior to onset of appropriate medical treatment for her RA she had already developed MCP ulnar drift toe dislocations and marginal joint erosions more so hands and feet than knees.  She has had injections in her knees in the past.  Last injection did not help and she is continue to have pain primarily along the medial joint line.  She points over the Pez bursal region where she has pain as well.  No history of stress fractures.  No history of  significant trauma or fall to the knee.  Of note is that increased BMI 42.  Review of Systems all other systems updated noncontributory to HPI.   Objective: Vital Signs: BP 125/75   Pulse 75   Ht 5\' 2"  (1.575 m)   Wt 230 lb (104.3 kg)   BMI 42.07 kg/m   Physical Exam Constitutional:      Appearance: She is well-developed.  HENT:     Head: Normocephalic.     Right Ear: External ear normal.     Left Ear: External ear normal. There is no impacted cerumen.  Eyes:     Pupils: Pupils are equal, round, and reactive to light.  Neck:     Thyroid: No thyromegaly.     Trachea: No tracheal deviation.  Cardiovascular:     Rate and Rhythm: Normal rate.  Pulmonary:     Effort: Pulmonary effort is normal.  Abdominal:     Palpations: Abdomen is soft.  Musculoskeletal:     Cervical back: No rigidity.  Skin:    General: Skin is warm and dry.  Neurological:     Mental Status: She is alert and oriented to person, place, and time.  Psychiatric:        Behavior: Behavior normal.    Ortho Exam negative logroll the hips.  Ulnar deviation of MCP joints bilateral.  She is amatory with a trace  left knee limp.  Negative discomfort with internal rotation 20 degrees right or left hip.  Knee reaches full extension with mild discomfort.  Tenderness over the medial tibial plateau by palpation and pes bursa.  No pes bursa excess fluid noted.  No palpable Baker's cyst.  Semitendinosus member gnosis normal to palpation.  ACL PCL exam is normal negative Lachman.  Significant crepitus with knee extension and patellofemoral loading.  Negative patellar subluxation laterally.  Specialty Comments:  No specialty comments available.  Imaging: No results found.   PMFS History: Patient Active Problem List   Diagnosis Date Noted   Pain in left knee 06/04/2021   History of PCOS 02/21/2017   High risk medication use 09/21/2016   Rheumatoid arthritis involving multiple sites with positive rheumatoid factor  (HCC) 09/21/2016   History of hypothyroidism 09/21/2016   History of depression 09/21/2016   Past Medical History:  Diagnosis Date   Arthritis    Rheumatoid arthritis (HCC)     Family History  Problem Relation Age of Onset   Hypertension Mother    Clotting disorder Mother    Cancer Maternal Grandmother        lung    Past Surgical History:  Procedure Laterality Date   BUNIONECTOMY Right    Social History   Occupational History   Not on file  Tobacco Use   Smoking status: Never   Smokeless tobacco: Never  Vaping Use   Vaping Use: Never used  Substance and Sexual Activity   Alcohol use: No   Drug use: No   Sexual activity: Not on file

## 2021-06-04 DIAGNOSIS — M25562 Pain in left knee: Secondary | ICD-10-CM | POA: Insufficient documentation

## 2021-06-08 ENCOUNTER — Telehealth: Payer: Self-pay | Admitting: Orthopaedic Surgery

## 2021-06-08 NOTE — Telephone Encounter (Signed)
left message for pt to return call to sch MRI left knee review with Dr. Ophelia Charter, after 06/23/21

## 2021-06-10 DIAGNOSIS — M7751 Other enthesopathy of right foot: Secondary | ICD-10-CM | POA: Diagnosis not present

## 2021-06-10 DIAGNOSIS — G5761 Lesion of plantar nerve, right lower limb: Secondary | ICD-10-CM | POA: Diagnosis not present

## 2021-06-11 DIAGNOSIS — M25512 Pain in left shoulder: Secondary | ICD-10-CM | POA: Diagnosis not present

## 2021-06-15 ENCOUNTER — Ambulatory Visit: Payer: BC Managed Care – PPO | Admitting: Rheumatology

## 2021-06-23 ENCOUNTER — Other Ambulatory Visit: Payer: Self-pay

## 2021-06-23 ENCOUNTER — Ambulatory Visit
Admission: RE | Admit: 2021-06-23 | Discharge: 2021-06-23 | Disposition: A | Discharge status: Home / Self Care | Payer: BC Managed Care – PPO | Source: Ambulatory Visit | Attending: Orthopaedic Surgery | Admitting: Orthopaedic Surgery

## 2021-06-23 DIAGNOSIS — M25562 Pain in left knee: Secondary | ICD-10-CM

## 2021-06-23 DIAGNOSIS — M7122 Synovial cyst of popliteal space [Baker], left knee: Secondary | ICD-10-CM | POA: Diagnosis not present

## 2021-06-23 DIAGNOSIS — S83412A Sprain of medial collateral ligament of left knee, initial encounter: Secondary | ICD-10-CM | POA: Diagnosis not present

## 2021-06-24 DIAGNOSIS — M2041 Other hammer toe(s) (acquired), right foot: Secondary | ICD-10-CM | POA: Diagnosis not present

## 2021-06-24 DIAGNOSIS — M25571 Pain in right ankle and joints of right foot: Secondary | ICD-10-CM | POA: Diagnosis not present

## 2021-06-24 DIAGNOSIS — G5761 Lesion of plantar nerve, right lower limb: Secondary | ICD-10-CM | POA: Diagnosis not present

## 2021-06-30 ENCOUNTER — Other Ambulatory Visit: Payer: Self-pay

## 2021-06-30 ENCOUNTER — Encounter: Payer: Self-pay | Admitting: Orthopaedic Surgery

## 2021-06-30 ENCOUNTER — Ambulatory Visit (INDEPENDENT_AMBULATORY_CARE_PROVIDER_SITE_OTHER): Payer: BC Managed Care – PPO | Admitting: Orthopaedic Surgery

## 2021-06-30 VITALS — BP 137/88 | Ht 62.0 in | Wt 230.0 lb

## 2021-06-30 DIAGNOSIS — M25562 Pain in left knee: Secondary | ICD-10-CM | POA: Diagnosis not present

## 2021-06-30 NOTE — Progress Notes (Signed)
Office Visit Note   Patient: Shannon David           Date of Birth: 03/08/1991           MRN: [id number] Visit Date: 06/30/2021              Requested by: Eunice Blase, PA-C [address] [place],  [place] [address] PCP: Eunice Blase, PA-C   Assessment & Plan: Visit Diagnoses:  1. Acute pain of left knee     Plan: Patient's MRI does show evidence of a grade 1 MCL sprain but no history of injury no tenderness with exam and no opening with valgus stress.  Small Baker's cyst seen on scan is not palpable on clinical exam.  We will set her up for some physical therapy for evaluation and treatment.  She states she is already getting some treatment for her shoulder with physical therapy and [place] and prescription given so they can include treatment for her left knee.  We reviewed images showed no evidence of meniscal or ligamentous injury other than the questionable MCL sprain with a normal MCL exam.  Recheck 5 weeks.  Follow-Up Instructions: Return in about 5 weeks (around 08/04/2021).   Orders:  Orders Placed This Encounter  Procedures   Ambulatory referral to Physical Therapy   No orders of the defined types were placed in this encounter.     Procedures: No procedures performed   Clinical Data: No additional findings.   Subjective: Chief Complaint  Patient presents with   Left Knee - Pain, Follow-up    MRI review left knee    HPI 31 year old female with diagnosis of RA on appropriate treatment with continued problems with left knee pain post injection in September by Dr.Deveshar which only gave her some temporary relief.  Patient's had an MRI scan to evaluate her for meniscal tear which is available and is reviewed today.  Patient complains of pain in her knee primarily at the joint line without locking.  More pain with ambulation.  She denies any recent falls.  Review of Systems all other systems updated unchanged from 06/03/2021 office  visit.   Objective: Vital Signs: BP 137/88    Ht 5\' 2"  (1.575 m)    Wt 230 lb (104.3 kg)    BMI 42.07 kg/m   Physical Exam Constitutional:      Appearance: She is well-developed.  HENT:     Head: Normocephalic.     Right Ear: External ear normal.     Left Ear: External ear normal. There is no impacted cerumen.  Eyes:     Pupils: Pupils are equal, round, and reactive to light.  Neck:     Thyroid: No thyromegaly.     Trachea: No tracheal deviation.  Cardiovascular:     Rate and Rhythm: Normal rate.  Pulmonary:     Effort: Pulmonary effort is normal.  Abdominal:     Palpations: Abdomen is soft.  Musculoskeletal:     Cervical back: No rigidity.  Skin:    General: Skin is warm and dry.  Neurological:     Mental Status: She is alert and oriented to person, place, and time.  Psychiatric:        Behavior: Behavior normal.    Ortho Exam ulnar deviation MCP joints consistent with RA diagnosis.  She is ambulatory in the office today without limping.  She has tenderness with palpation along the joint line both medial and lateral.  No palpable Baker's cyst posteriorly.  Hamstrings are normal.  Negative logroll the hips.  Normal patellar tracking.  Specialty Comments:  No specialty comments available.  Imaging: CLINICAL DATA:  Left medial joint line knee pain related to walking or lifting knee for 4 months. Clinical concern for pes anserine bursitis or stress fracture.   EXAM: MRI OF THE LEFT KNEE WITHOUT CONTRAST   TECHNIQUE: Multiplanar, multisequence MR imaging of the knee was performed. No intravenous contrast was administered.   COMPARISON:  X-ray left knee 06/03/2021.   FINDINGS: MENISCI   Medial: Intact.   Lateral: Intact.   LIGAMENTS   Cruciates: ACL and PCL are intact.   Collaterals: There is periligamentous edema along the proximal medial collateral ligament, which is otherwise intact. Lateral collateral ligament complex is intact.   CARTILAGE    Patellofemoral:  No chondral defect.   Medial:  No chondral defect.   Lateral:  No chondral defect.   JOINT: No significant joint effusion.   POPLITEAL FOSSA: Small Baker cyst.   EXTENSOR MECHANISM: Intact quadriceps tendon. Intact patellar tendon.   BONES: No aggressive osseous lesion. No fracture or dislocation.   Other: No fluid collection or hematoma. Muscles are normal.   IMPRESSION: Grade 1 MCL sprain.   Small Baker cyst.   No evidence of bursitis, meniscus tear, or acute osseous abnormality.     Electronically Signed   By: Caprice Renshaw M.D.   On: 06/23/2021 08:53     PMFS History: Patient Active Problem List   Diagnosis Date Noted   Pain in left knee 06/04/2021   History of PCOS 02/21/2017   High risk medication use 09/21/2016   Rheumatoid arthritis involving multiple sites with positive rheumatoid factor (HCC) 09/21/2016   History of hypothyroidism 09/21/2016   History of depression 09/21/2016   Past Medical History:  Diagnosis Date   Arthritis    Rheumatoid arthritis (HCC)     Family History  Problem Relation Age of Onset   Hypertension Mother    Clotting disorder Mother    Cancer Maternal Grandmother        lung    Past Surgical History:  Procedure Laterality Date   BUNIONECTOMY Right    Social History   Occupational History   Not on file  Tobacco Use   Smoking status: Never   Smokeless tobacco: Never  Vaping Use   Vaping Use: Never used  Substance and Sexual Activity   Alcohol use: No   Drug use: No   Sexual activity: Not on file

## 2021-07-06 DIAGNOSIS — M722 Plantar fascial fibromatosis: Secondary | ICD-10-CM | POA: Diagnosis not present

## 2021-07-06 DIAGNOSIS — M25571 Pain in right ankle and joints of right foot: Secondary | ICD-10-CM | POA: Diagnosis not present

## 2021-07-06 DIAGNOSIS — G5761 Lesion of plantar nerve, right lower limb: Secondary | ICD-10-CM | POA: Diagnosis not present

## 2021-07-08 DIAGNOSIS — M25571 Pain in right ankle and joints of right foot: Secondary | ICD-10-CM | POA: Diagnosis not present

## 2021-07-08 DIAGNOSIS — M25562 Pain in left knee: Secondary | ICD-10-CM | POA: Diagnosis not present

## 2021-07-14 DIAGNOSIS — M25562 Pain in left knee: Secondary | ICD-10-CM | POA: Diagnosis not present

## 2021-07-14 DIAGNOSIS — M25571 Pain in right ankle and joints of right foot: Secondary | ICD-10-CM | POA: Diagnosis not present

## 2021-07-16 DIAGNOSIS — G4700 Insomnia, unspecified: Secondary | ICD-10-CM | POA: Diagnosis not present

## 2021-07-16 DIAGNOSIS — Z0000 Encounter for general adult medical examination without abnormal findings: Secondary | ICD-10-CM | POA: Diagnosis not present

## 2021-07-16 DIAGNOSIS — E039 Hypothyroidism, unspecified: Secondary | ICD-10-CM | POA: Diagnosis not present

## 2021-07-16 DIAGNOSIS — F339 Major depressive disorder, recurrent, unspecified: Secondary | ICD-10-CM | POA: Diagnosis not present

## 2021-07-20 ENCOUNTER — Other Ambulatory Visit: Payer: Self-pay

## 2021-07-20 DIAGNOSIS — M0579 Rheumatoid arthritis with rheumatoid factor of multiple sites without organ or systems involvement: Secondary | ICD-10-CM

## 2021-07-20 MED ORDER — RINVOQ 15 MG PO TB24: 15.0000 mg | ORAL_TABLET | Freq: Every day | ORAL | 0 refills | Status: DC

## 2021-07-20 NOTE — Telephone Encounter (Signed)
Patient called requesting prescription refill of Rinvoq to be sent to [facility].  Patient states she called for her refill and was told they didn't have a prescription on file and to contact our office.  Patient states she has been out of medication since Thursday, 07/16/21.

## 2021-07-20 NOTE — Telephone Encounter (Signed)
Next Visit: 08/27/2021  Last Visit: 05/28/2021  Last Fill: 08/12/2020  [id number] arthritis involving multiple sites with positive rheumatoid factor   Current Dose per office note 05/28/2021: Rinvoq 15 mg 1 tablet by mouth daily  Labs: 05/28/2021 CBC and CMP are normal.  TB Gold: 10/21/2020 Neg    Okay to refill Rinvoq?

## 2021-07-22 ENCOUNTER — Telehealth: Payer: Self-pay | Admitting: Rheumatology

## 2021-07-22 DIAGNOSIS — G5761 Lesion of plantar nerve, right lower limb: Secondary | ICD-10-CM | POA: Diagnosis not present

## 2021-07-22 DIAGNOSIS — M25571 Pain in right ankle and joints of right foot: Secondary | ICD-10-CM | POA: Diagnosis not present

## 2021-07-22 NOTE — Telephone Encounter (Signed)
Left message to advise patient she may come to the office to pick up a sample. Reserve sample in the cabinet for patient.

## 2021-07-22 NOTE — Telephone Encounter (Signed)
Patient called the office stating she has been dealing with her pharmacy to get her Rinvoq filled and they have just filled it but she wont be able to get it for a few days. Patient states she wishes to know if we had any samples until hers came.

## 2021-07-22 NOTE — Telephone Encounter (Signed)
Medication Samples have been provided to the patient.  Drug name: Rinvoq       Strength: 15mg         Qty: 1  LOTVI:[id number]  Exp.Date: 05/06/2022  Dosing instructions: Take 1 tablet by mouth daily.

## 2021-07-27 DIAGNOSIS — J069 Acute upper respiratory infection, unspecified: Secondary | ICD-10-CM | POA: Diagnosis not present

## 2021-07-28 ENCOUNTER — Ambulatory Visit: Payer: BC Managed Care – PPO | Admitting: Sports Medicine

## 2021-07-28 ENCOUNTER — Other Ambulatory Visit: Payer: Self-pay | Admitting: *Deleted

## 2021-07-28 ENCOUNTER — Encounter: Payer: Self-pay | Admitting: Sports Medicine

## 2021-07-28 ENCOUNTER — Other Ambulatory Visit: Payer: Self-pay

## 2021-07-28 ENCOUNTER — Ambulatory Visit (INDEPENDENT_AMBULATORY_CARE_PROVIDER_SITE_OTHER): Payer: BC Managed Care – PPO

## 2021-07-28 DIAGNOSIS — M792 Neuralgia and neuritis, unspecified: Secondary | ICD-10-CM

## 2021-07-28 DIAGNOSIS — M19079 Primary osteoarthritis, unspecified ankle and foot: Secondary | ICD-10-CM

## 2021-07-28 DIAGNOSIS — M7741 Metatarsalgia, right foot: Secondary | ICD-10-CM

## 2021-07-28 DIAGNOSIS — M05772 Rheumatoid arthritis with rheumatoid factor of left ankle and foot without organ or systems involvement: Secondary | ICD-10-CM

## 2021-07-28 DIAGNOSIS — M7742 Metatarsalgia, left foot: Secondary | ICD-10-CM

## 2021-07-28 DIAGNOSIS — M2061 Acquired deformities of toe(s), unspecified, right foot: Secondary | ICD-10-CM

## 2021-07-28 DIAGNOSIS — M2062 Acquired deformities of toe(s), unspecified, left foot: Secondary | ICD-10-CM | POA: Diagnosis not present

## 2021-07-28 DIAGNOSIS — M05771 Rheumatoid arthritis with rheumatoid factor of right ankle and foot without organ or systems involvement: Secondary | ICD-10-CM

## 2021-07-28 DIAGNOSIS — M19071 Primary osteoarthritis, right ankle and foot: Secondary | ICD-10-CM | POA: Diagnosis not present

## 2021-07-28 DIAGNOSIS — M79672 Pain in left foot: Secondary | ICD-10-CM

## 2021-07-29 ENCOUNTER — Other Ambulatory Visit: Payer: Self-pay | Admitting: Sports Medicine

## 2021-07-29 DIAGNOSIS — M792 Neuralgia and neuritis, unspecified: Secondary | ICD-10-CM

## 2021-07-29 DIAGNOSIS — M19079 Primary osteoarthritis, unspecified ankle and foot: Secondary | ICD-10-CM

## 2021-07-29 NOTE — Progress Notes (Signed)
Subjective: Shannon David is a 31 y.o. female patient who presents to office for evaluation of Right> Left foot pain. Patient complains of progressive pain especially over the last 2 years with soreness burning and stinging can't stand longer than 20 mins before she has pain at the ball of the foot with the toes getting worse and states that on the left big toe joint also throbs and hurts with shoes. Treated previously by Podiatrist, Dr. Casilda Carls got injections and is waiting on custom insoles and has changes shoes with no relief in symptoms. Patient denies any other pedal complaints.   + RA history on methatrexate and rheumcaid  Patient Active Problem List   Diagnosis Date Noted   Pain in left knee 06/04/2021   History of PCOS 02/21/2017   High risk medication use 09/21/2016   Rheumatoid arthritis involving multiple sites with positive rheumatoid factor ([place]) 09/21/2016   History of hypothyroidism 09/21/2016   History of depression 09/21/2016    Current Outpatient Medications on File Prior to Visit  Medication Sig Dispense Refill   acetaminophen (TYLENOL) 500 MG tablet Take 1,000 mg by mouth every 6 (six) hours as needed for mild pain.     folic acid (FOLVITE) 1 MG tablet TAKE 2 TABLETS BY MOUTH EVERY DAY 180 tablet 3   levothyroxine (SYNTHROID) 88 MCG tablet Take 88 mcg by mouth every morning.     methotrexate (RHEUMATREX) 2.5 MG tablet TAKE 8 TABLETS (20 MG TOTAL) BY MOUTH ONCE A WEEK. CAUTION:CHEMOTHERAPY. PROTECT FROM LIGHT. 96 tablet 0   ondansetron (ZOFRAN) 4 MG tablet Take 1 tablet (4 mg total) by mouth as needed. 30 tablet 1   PARoxetine (PAXIL) 40 MG tablet Take 40 mg by mouth every morning.     SRONYX 0.1-20 MG-MCG tablet Take 1 tablet by mouth daily.     Upadacitinib ER (RINVOQ) 15 MG TB24 Take 15 mg by mouth daily. 90 tablet 0   No current facility-administered medications on file prior to visit.    Allergies  Allergen Reactions   Keflex [Cephalexin] Hives    Objective:   General: Alert and oriented x3 in no acute distress  Dermatology: No hyperkeratotic lesions bilateral. No open lesions bilateral lower extremities, no webspace macerations, no ecchymosis bilateral, all nails x 10 are well manicured.  Vascular: Dorsalis Pedis and Posterior Tibial pedal pulses 2/4, Capillary Fill Time 3 seconds,(+) pedal hair growth bilateral, no edema bilateral lower extremities, Temperature gradient within normal limits.  Neurology: Gross sensation intact via light touch bilateral, Subjective burning and numbness to both feet worse at toes and balls.   Musculoskeletal: Semi-flexible hammertoes 2-5 with Mild tenderness with palpation at PIPJ Right>Left with 2nd toe being most deviated and dorsally dislocated with + lachman, enlarged board fat pad with pain to MTPJs. Forefoot deformity.  Gait: Unassisted, antalgic.  Xrays  Right/Left Foot    Impression:Severe digital deformity with dislocation R>L, hx of RA       Assessment and Plan: Problem List Items Addressed This Visit   None Visit Diagnoses     Arthritis of foot    -  Primary   Relevant Orders   DG Foot Complete Right   DG Foot Complete Left   Neuritis       Relevant Orders   NCV with EMG(electromyography)   Ambulatory referral to Neurology   Acquired deformity of right toe       Toe deformity, left       Metatarsalgia of both feet  Rheumatoid arthritis involving both feet with positive rheumatoid factor (HCC)            -Complete examination performed -Xrays reviewed -Discussed treatement options for deformity secondary to progressive changes related to RA with concern for nerve involvement  -Rx Neurology eval at [facility] to see if there is a need for NCV/EMG prior to further discussion on surgery; may need pan met and hammertoe arthrodesis  -Dispensed met pad sleeves for extra support and offloading of balls meanwhile -Continue with RA mgt with Dr. Dora Sims  -Patient to return to office after  neurology or sooner if condition worsens.  Landis Martins, DPM

## 2021-07-30 ENCOUNTER — Encounter: Payer: Self-pay | Admitting: Neurology

## 2021-08-04 ENCOUNTER — Ambulatory Visit: Payer: BC Managed Care – PPO | Admitting: Orthopaedic Surgery

## 2021-08-04 NOTE — Progress Notes (Signed)
[name]   Date: 08/05/21  Shannon David MRN: [id number] DOB: [birth year]   Dear Dr. Marylene Land:  Thank you for your kind referral of Shannon David for consultation of bilateral feet pain. Although her history is well known to you, please allow [place] to reiterate it for the purpose of our medical record. The patient was accompanied to the clinic by husband who also provides collateral information.     History of Present Illness: Shannon David is a 31 y.o. left-handed female with long history of seropositive RA presenting for evaluation of bilateral foot pain.   She has chronic feet pain, which is worse when she is walking and standing on her feet, worse on the right.  Pain is throbbing, achy, and can be burning over the balls of the feet. Occasionally, she may get tingling in the toes. She does not have any symptoms at rest.  No weakness.  She has foot deformity from the severity of her arthritis.     Out-side paper records, electronic medical record, and images have been reviewed where available and summarized as:   Lab Results  Component Value Date   ESRSEDRATE 12 01/16/2021    Past Medical History:  Diagnosis Date   Arthritis    Rheumatoid arthritis (HCC)     Past Surgical History:  Procedure Laterality Date   BUNIONECTOMY Right      Medications:  Outpatient Encounter Medications as of 08/05/2021  Medication Sig   acetaminophen (TYLENOL) 500 MG tablet Take 1,000 mg by mouth every 6 (six) hours as needed for mild pain.   folic acid (FOLVITE) 1 MG tablet TAKE 2 TABLETS BY MOUTH EVERY DAY   levothyroxine (SYNTHROID) 88 MCG tablet Take 88 mcg by mouth every morning.   methotrexate (RHEUMATREX) 2.5 MG tablet TAKE 8 TABLETS (20 MG TOTAL) BY MOUTH ONCE A WEEK. CAUTION:CHEMOTHERAPY. PROTECT FROM LIGHT.   ondansetron (ZOFRAN) 4 MG tablet Take 1 tablet (4 mg total) by mouth as needed.   PARoxetine (PAXIL) 40 MG tablet  Take 40 mg by mouth every morning.   SRONYX 0.1-20 MG-MCG tablet Take 1 tablet by mouth daily.   temazepam (RESTORIL) 15 MG capsule Take 15 mg by mouth at bedtime.   Upadacitinib ER (RINVOQ) 15 MG TB24 Take 15 mg by mouth daily.   No facility-administered encounter medications on file as of 08/05/2021.    Allergies:  Allergies  Allergen Reactions   Keflex [Cephalexin] Hives    Family History: Family History  Problem Relation Age of Onset   Hypertension Mother    Clotting disorder Mother    Cancer Maternal Grandmother        lung    Social History: Social History   Tobacco Use   Smoking status: Never   Smokeless tobacco: Never  Vaping Use   Vaping Use: Never used  Substance Use Topics   Alcohol use: No   Drug use: No   Social History   Social History Narrative   Left Handed    Lives in a one story home   Drinks Caffeine     Vital Signs:  BP 125/78    Pulse 87    Ht 5\' 2"  (1.575 m)    Wt 234 lb (106.1 kg)    SpO2 99%    BMI 42.80 kg/m    Neurological Exam: MENTAL STATUS including orientation to time, place, person, recent and remote memory, attention span and concentration, language, and fund of knowledge is normal.  Speech is not dysarthric.  CRANIAL NERVES: II:  No visual field defects.     III-IV-VI: Pupils equal round and reactive to light.  Normal conjugate, extra-ocular eye movements in all directions of gaze.  No nystagmus.  No ptosis.   V:  Normal facial sensation.    VII:  Normal facial symmetry and movements.   VIII:  Normal hearing and vestibular function.   IX-X:  Normal palatal movement.   XI:  Normal shoulder shrug and head rotation.   XII:  Normal tongue strength and range of motion, no deviation or fasciculation.  MOTOR:  Right foot with hammer toes and toe deviation. No atrophy, fasciculations or abnormal movements.  No pronator drift.   Upper Extremity:  Right  Left  Deltoid  5/5   5/5   Biceps  5/5   5/5   Triceps  5/5   5/5   Wrist  extensors  5/5   5/5   Wrist flexors  5/5   5/5   Finger extensors  5/5   5/5   Finger flexors  5/5   5/5   Dorsal interossei  5/5   5/5   Abductor pollicis  5/5   5/5   Tone (Ashworth scale)  0  0   Lower Extremity:  Right  Left  Hip flexors  5/5   5/5   Knee flexors  5/5   5/5   Knee extensors  5/5   5/5   Dorsiflexors  5/5   5/5   Plantarflexors  5/5   5/5   Toe extensors  5/5   5/5   Toe flexors  5/5   5/5   Tone (Ashworth scale)  0  0   MSRs:  Right        Left                  brachioradialis 2+  2+  biceps 2+  2+  triceps 2+  2+  patellar 2+  2+  ankle jerk 2+  2+  Hoffman no  no  plantar response down  down   SENSORY:  Normal and symmetric perception of light touch, pinprick, vibration, and temperature.  Romberg's sign absent.   COORDINATION/GAIT: Normal finger-to- nose-finger.  Intact rapid alternating movements bilaterally. Gait narrow based and stable. Tandem gait intact.    IMPRESSION: Bilateral feet pain, which is stemming from the severity of arthritis. Her clinical history or exam does not suggest an overlapping peripheral neuropathy and therefore, I do not see that NCS/EMG would be of high value in her care. I cannot exclude that she may have some distal nerve compression from boney deformity when she is weight bearing, but these symptoms are transient indicating no permanent nerve injury. She will follow-up with Dr. Marylene LandStover to discuss surgical options.    Thank you for allowing me to participate in patient's care.  If I can answer any additional questions, I would be pleased to do so.    Sincerely,    Brighten Orndoff K. Allena KatzPatel, DO

## 2021-08-05 ENCOUNTER — Other Ambulatory Visit: Payer: Self-pay | Admitting: Sports Medicine

## 2021-08-05 ENCOUNTER — Other Ambulatory Visit: Payer: Self-pay

## 2021-08-05 ENCOUNTER — Encounter: Payer: Self-pay | Admitting: Neurology

## 2021-08-05 ENCOUNTER — Ambulatory Visit: Payer: BC Managed Care – PPO | Admitting: Neurology

## 2021-08-05 VITALS — BP 125/78 | HR 87 | Ht 62.0 in | Wt 234.0 lb

## 2021-08-05 DIAGNOSIS — G8929 Other chronic pain: Secondary | ICD-10-CM

## 2021-08-05 DIAGNOSIS — Z6839 Body mass index (BMI) 39.0-39.9, adult: Secondary | ICD-10-CM | POA: Diagnosis not present

## 2021-08-05 DIAGNOSIS — M79671 Pain in right foot: Secondary | ICD-10-CM

## 2021-08-05 DIAGNOSIS — M79672 Pain in left foot: Secondary | ICD-10-CM | POA: Diagnosis not present

## 2021-08-05 DIAGNOSIS — M19079 Primary osteoarthritis, unspecified ankle and foot: Secondary | ICD-10-CM

## 2021-08-05 DIAGNOSIS — B349 Viral infection, unspecified: Secondary | ICD-10-CM | POA: Diagnosis not present

## 2021-08-05 DIAGNOSIS — J040 Acute laryngitis: Secondary | ICD-10-CM | POA: Diagnosis not present

## 2021-08-05 NOTE — Progress Notes (Signed)
Knee scooter order placed

## 2021-08-05 NOTE — Patient Instructions (Signed)
Your exam does show any signs of neuropathy so there is no need for additional nerve testing.  Please follow-up with Dr. Marylene Land

## 2021-08-06 ENCOUNTER — Telehealth: Payer: Self-pay | Admitting: Rheumatology

## 2021-08-06 NOTE — Telephone Encounter (Signed)
Please confirm with the patient that she should to stop methotrexate 1 week prior to the surgery and Rinvoq 2 weeks prior to the surgery she may resume both medications 2 weeks after the surgery there is no infection and she gets clearance from the surgeon.

## 2021-08-06 NOTE — Telephone Encounter (Signed)
Patient advised  she should to stop methotrexate 1 week prior to the surgery and Rinvoq 2 weeks prior to the surgery she may resume both medications 2 weeks after the surgery there is no infection and she gets clearance from the surgeon. Patient expressed understanding.

## 2021-08-06 NOTE — Telephone Encounter (Signed)
Patient called the office stating she wanted to inform Dr. Corliss Skainseveshwar that she was having surgery in her foot on Feb 27th. Patient states she understands she will need to stop her Methotrexate and Rinvoq.

## 2021-08-14 ENCOUNTER — Telehealth: Payer: Self-pay | Admitting: Rheumatology

## 2021-08-14 NOTE — Telephone Encounter (Signed)
Patient states she has been taking an increased amount of Ibuprofen and wanted to know when she should stop that prior to her surgery. Patient advised to contact the surgeon to discuss. Patient aware to stop Rinvoq and MTX prior to her surgery and that she may restart 2 weeks after surgery if her surgeon clears her and there is no infection.

## 2021-08-14 NOTE — Telephone Encounter (Signed)
Patient called the office stating she is having surgery on Feb 27th and she wants to know when she needs to stop taking ibuprofen prior to her surgery. Patient states she has been having to take it a lot lately.

## 2021-08-14 NOTE — Telephone Encounter (Signed)
Attempted to contact the patient and left message for patient to call the office.  

## 2021-08-19 ENCOUNTER — Ambulatory Visit (INDEPENDENT_AMBULATORY_CARE_PROVIDER_SITE_OTHER): Payer: BC Managed Care – PPO | Admitting: Sports Medicine

## 2021-08-19 ENCOUNTER — Encounter: Payer: Self-pay | Admitting: Sports Medicine

## 2021-08-19 ENCOUNTER — Telehealth: Payer: Self-pay | Admitting: Urology

## 2021-08-19 DIAGNOSIS — M19079 Primary osteoarthritis, unspecified ankle and foot: Secondary | ICD-10-CM

## 2021-08-19 DIAGNOSIS — M05771 Rheumatoid arthritis with rheumatoid factor of right ankle and foot without organ or systems involvement: Secondary | ICD-10-CM

## 2021-08-19 DIAGNOSIS — M2041 Other hammer toe(s) (acquired), right foot: Secondary | ICD-10-CM | POA: Diagnosis not present

## 2021-08-19 DIAGNOSIS — M05772 Rheumatoid arthritis with rheumatoid factor of left ankle and foot without organ or systems involvement: Secondary | ICD-10-CM

## 2021-08-19 DIAGNOSIS — M2061 Acquired deformities of toe(s), unspecified, right foot: Secondary | ICD-10-CM

## 2021-08-19 NOTE — Telephone Encounter (Signed)
DOS - 08/24/21   PAN-METATARSAL HEAD RESECTION 2-5 RIGHT --- 07622 HAMMERTOE REPAIR 1-5 RIGHT --- 63335  [employer] EFFECTIVE DATE - 06/28/21  PLAN DEDUCTIBLE - $2,250.00 W/ $1,428.00 REMAINING OUT OF POCKET - $4,250.00 W/ $3,114.00 REMAINING COINSURANCE - 20% COPAY - $0.00   SPOKE WITH ROD WITH [employer] AND SHE STATED THAT FOR CPT CODES 45625 AND [id number] NO PRIOR AUTH IS REQUIRED.   REF # I - [id number]

## 2021-08-19 NOTE — Progress Notes (Signed)
Subjective: Shannon David is a 31 y.o. female patient who returns to office for follow-up evaluation of right greater than left foot pain.  Patient reports that the pain is about the same hurts to be on it hurts if she has to walk or stand for a long period of time swells and throb states that it is a little bit more sore and tender since she has been off of her arthritic medicines for the last 2 weeks.  Patient also saw neurology and got clearance for surgery as well.  Patient denies any other pedal complaints at this time.  + RA history on methatrexate and rheumcaid, currently on hold for procedure will anticipate likely trying to resume these medications 1 to 2 weeks postop meanwhile patient may take Tylenol as needed for pain until time for surgery.  Patient Active Problem List   Diagnosis Date Noted   Pain in left knee 06/04/2021   History of PCOS 02/21/2017   High risk medication use 09/21/2016   Rheumatoid arthritis involving multiple sites with positive rheumatoid factor (HCC) 09/21/2016   History of hypothyroidism 09/21/2016   History of depression 09/21/2016    Current Outpatient Medications on File Prior to Visit  Medication Sig Dispense Refill   acetaminophen (TYLENOL) 500 MG tablet Take 1,000 mg by mouth every 6 (six) hours as needed for mild pain.     fluticasone (FLONASE) 50 MCG/ACT nasal spray Place 2 sprays into both nostrils daily.     folic acid (FOLVITE) 1 MG tablet TAKE 2 TABLETS BY MOUTH EVERY DAY 180 tablet 3   levothyroxine (SYNTHROID) 88 MCG tablet Take 88 mcg by mouth every morning.     methotrexate (RHEUMATREX) 2.5 MG tablet TAKE 8 TABLETS (20 MG TOTAL) BY MOUTH ONCE A WEEK. CAUTION:CHEMOTHERAPY. PROTECT FROM LIGHT. 96 tablet 0   ondansetron (ZOFRAN) 4 MG tablet Take 1 tablet (4 mg total) by mouth as needed. 30 tablet 1   PARoxetine (PAXIL) 40 MG tablet Take 40 mg by mouth every morning.     SRONYX 0.1-20 MG-MCG tablet Take 1 tablet by mouth daily.     temazepam  (RESTORIL) 15 MG capsule Take 15 mg by mouth at bedtime.     Upadacitinib ER (RINVOQ) 15 MG TB24 Take 15 mg by mouth daily. 90 tablet 0   No current facility-administered medications on file prior to visit.    Allergies  Allergen Reactions   Keflex [Cephalexin] Hives   Social History   Socioeconomic History   Marital status: Married    Spouse name: Not on file   Number of children: 0   Years of education: Not on file   Highest education level: Not on file  Occupational History   Not on file  Tobacco Use   Smoking status: Never   Smokeless tobacco: Never  Vaping Use   Vaping Use: Never used  Substance and Sexual Activity   Alcohol use: No   Drug use: No   Sexual activity: Not on file  Other Topics Concern   Not on file  Social History Narrative   Left Handed    Lives in a one story home   Drinks Caffeine    Social Determinants of Health   Financial Resource Strain: Not on file  Food Insecurity: Not on file  Transportation Needs: Not on file  Physical Activity: Not on file  Stress: Not on file  Social Connections: Not on file  Intimate Partner Violence: Not on file    Family History  Problem Relation Age of Onset   Hypertension Mother    Clotting disorder Mother    Cancer Maternal Grandmother        lung    Past Surgical History:  Procedure Laterality Date   BUNIONECTOMY Right     Objective:  General: Alert and oriented x3 in no acute distress  Dermatology: No hyperkeratotic lesions bilateral. No open lesions bilateral lower extremities, no webspace macerations, no ecchymosis bilateral, all nails x 10 are well manicured.  Vascular: Dorsalis Pedis and Posterior Tibial pedal pulses 2/4, Capillary Fill Time 3 seconds,(+) pedal hair growth bilateral, no edema bilateral lower extremities, Temperature gradient within normal limits.  Neurology: Gross sensation intact via light touch bilateral, Subjective burning and numbness to both feet worse at toes and  balls.   Musculoskeletal: Semi-flexible hammertoes 1-5 with Mild tenderness with palpation at PIPJ Right>Left with 2nd>3rd toe being most deviated and dorsally dislocated with + lachman, enlarged board fat pad with pain to MTPJs. Forefoot deformity as previously noted.        Assessment and Plan: Problem List Items Addressed This Visit   None Visit Diagnoses     Acquired deformity of right toe    -  Primary   Hammer toe of right foot       Arthritis of foot       Rheumatoid arthritis involving both feet with positive rheumatoid factor (HCC)            -Complete examination performed -Previous imaging reviewed -Neurology and rheumatology recs reviewed; continue with holding rheumatological medications will anticipate likely resume in 1 to 2 weeks postop meanwhile patient may take Tylenol as needed for joint pain -Patient opt for surgical management. Consent obtained for right first through fifth hammertoe repair with K wire and metatarsal head resection 2 through 5 secondary to severe deformity of digits due to rheumatoid arthritis.  Pre and Post op course explained. Risks, benefits, alternatives explained. No guarantees given or implied. Surgical booking slip submitted and provided patient with Surgical packet and info for [facility].  Tentative date of surgery 08/24/2021 -We will plan to dispense a wedge shoe -Patient to use knee scooter to assist with mobility -Postop we anticipate putting patient on hydrocodone with taking Motrin in between doses until we can resume her rheumatological medicines -Patient to return to office after surgery or sooner if condition worsens.  Asencion Islam, DPM

## 2021-08-22 ENCOUNTER — Other Ambulatory Visit: Payer: Self-pay | Admitting: Sports Medicine

## 2021-08-22 DIAGNOSIS — Z9889 Other specified postprocedural states: Secondary | ICD-10-CM

## 2021-08-22 MED ORDER — HYDROCODONE-ACETAMINOPHEN 10-325 MG PO TABS: 1.0000 | ORAL_TABLET | Freq: Four times a day (QID) | ORAL | 0 refills | Status: AC | PRN

## 2021-08-22 MED ORDER — IBUPROFEN 800 MG PO TABS: 800.0000 mg | ORAL_TABLET | Freq: Three times a day (TID) | ORAL | 0 refills | Status: DC | PRN

## 2021-08-22 MED ORDER — DOCUSATE SODIUM 100 MG PO CAPS: 100.0000 mg | ORAL_CAPSULE | Freq: Two times a day (BID) | ORAL | 0 refills | Status: DC | PRN

## 2021-08-22 MED ORDER — PROMETHAZINE HCL 12.5 MG PO TABS: 12.5000 mg | ORAL_TABLET | Freq: Four times a day (QID) | ORAL | 0 refills | Status: DC | PRN

## 2021-08-22 NOTE — Progress Notes (Signed)
Post op meds entered 

## 2021-08-24 ENCOUNTER — Encounter: Payer: Self-pay | Admitting: Sports Medicine

## 2021-08-24 DIAGNOSIS — M205X1 Other deformities of toe(s) (acquired), right foot: Secondary | ICD-10-CM | POA: Diagnosis not present

## 2021-08-24 DIAGNOSIS — M2041 Other hammer toe(s) (acquired), right foot: Secondary | ICD-10-CM | POA: Diagnosis not present

## 2021-08-24 DIAGNOSIS — M069 Rheumatoid arthritis, unspecified: Secondary | ICD-10-CM | POA: Diagnosis not present

## 2021-08-24 DIAGNOSIS — M7741 Metatarsalgia, right foot: Secondary | ICD-10-CM | POA: Diagnosis not present

## 2021-08-24 DIAGNOSIS — M21541 Acquired clubfoot, right foot: Secondary | ICD-10-CM | POA: Diagnosis not present

## 2021-08-24 HISTORY — PX: FOOT SURGERY: SHX648

## 2021-08-25 ENCOUNTER — Telehealth: Payer: Self-pay | Admitting: Sports Medicine

## 2021-08-25 NOTE — Telephone Encounter (Signed)
Postoperative check phone call made to patient.  Patient reports that she is doing good nerve block is slowly starting to wear off but she has been taking her ibuprofen which is helping.  Patient denies any other complaints at this time.  I reminded patient to continue with taking her medication as directed, rest, ice, and elevation and also reminded patient to keep dressing clean dry and intact.  I also informed patient that I will be leaving in June and she wished me look and thanked me for getting her in for surgery.  I thanked patient for her regards and advised her to call office if there are any problems or concerns meanwhile we will see her as scheduled on next week. -Dr. Marylene Land

## 2021-08-26 ENCOUNTER — Other Ambulatory Visit: Payer: Self-pay | Admitting: Sports Medicine

## 2021-08-26 MED ORDER — GABAPENTIN 300 MG PO CAPS: 300.0000 mg | ORAL_CAPSULE | Freq: Every day | ORAL | 3 refills | Status: DC

## 2021-08-26 NOTE — Progress Notes (Signed)
Rx Gabapentin for twitching pain at night to see if this will help and allow patient to get some sleep ?-Dr. Kathie RhodesS ?

## 2021-08-27 ENCOUNTER — Telehealth: Payer: Self-pay | Admitting: *Deleted

## 2021-08-27 ENCOUNTER — Ambulatory Visit: Payer: BC Managed Care – PPO | Admitting: Rheumatology

## 2021-08-27 NOTE — Telephone Encounter (Signed)
Patient called and stated that she got the prescription last night and she did sleep and was feeling better. Lattie Haw ?

## 2021-08-27 NOTE — Telephone Encounter (Signed)
-----   Message from Asencion Islam, [place] sent at 08/26/2021  5:11 PM EST ----- ?Rx Gabapentin for twitching pain at night to see if this will help and allow patient to get some sleep ?-Dr. Kathie Rhodes ?----- Message ----- ?From: Lanney Gins, PMAC ?Sent: 08/26/2021   5:01 PM EST ?To: Asencion Islam, DPM ? ?Patient called and stated that she got a pillow wedge and that has really helped and she does have some twitching in her leg and wanted to know if she could get some thing for sleep. Misty Stanley ? ? ?

## 2021-08-31 DIAGNOSIS — R350 Frequency of micturition: Secondary | ICD-10-CM | POA: Diagnosis not present

## 2021-08-31 DIAGNOSIS — Z6839 Body mass index (BMI) 39.0-39.9, adult: Secondary | ICD-10-CM | POA: Diagnosis not present

## 2021-08-31 DIAGNOSIS — N3941 Urge incontinence: Secondary | ICD-10-CM | POA: Diagnosis not present

## 2021-09-01 ENCOUNTER — Encounter: Payer: Self-pay | Admitting: Sports Medicine

## 2021-09-01 ENCOUNTER — Encounter: Payer: BC Managed Care – PPO | Admitting: Sports Medicine

## 2021-09-01 ENCOUNTER — Other Ambulatory Visit: Payer: Self-pay

## 2021-09-01 ENCOUNTER — Ambulatory Visit (INDEPENDENT_AMBULATORY_CARE_PROVIDER_SITE_OTHER): Payer: BC Managed Care – PPO | Admitting: Sports Medicine

## 2021-09-01 ENCOUNTER — Ambulatory Visit (INDEPENDENT_AMBULATORY_CARE_PROVIDER_SITE_OTHER): Payer: BC Managed Care – PPO

## 2021-09-01 ENCOUNTER — Other Ambulatory Visit: Payer: Self-pay | Admitting: Sports Medicine

## 2021-09-01 VITALS — BP 113/62 | HR 78 | Temp 98.0°F | Resp 14

## 2021-09-01 DIAGNOSIS — M2061 Acquired deformities of toe(s), unspecified, right foot: Secondary | ICD-10-CM

## 2021-09-01 DIAGNOSIS — M05771 Rheumatoid arthritis with rheumatoid factor of right ankle and foot without organ or systems involvement: Secondary | ICD-10-CM

## 2021-09-01 DIAGNOSIS — M05772 Rheumatoid arthritis with rheumatoid factor of left ankle and foot without organ or systems involvement: Secondary | ICD-10-CM

## 2021-09-01 DIAGNOSIS — Z9889 Other specified postprocedural states: Secondary | ICD-10-CM

## 2021-09-01 DIAGNOSIS — M2041 Other hammer toe(s) (acquired), right foot: Secondary | ICD-10-CM

## 2021-09-01 MED ORDER — HYDROCODONE-ACETAMINOPHEN 10-325 MG PO TABS: 1.0000 | ORAL_TABLET | Freq: Four times a day (QID) | ORAL | 0 refills | Status: AC | PRN

## 2021-09-01 NOTE — Progress Notes (Signed)
Subjective: ?Shannon David is a 31 y.o. female patient seen today in office for POV #1 (DOS 08-24-21), S/P Right first through fifth hammertoe repair and second through fifth met head resection. Patient denies current pain at this time however states that ibuprofen and hydrocodone seems to be helping.  No other issues noted.  ? ?Patient is assisted by husband this visit. ? ?Patient Active Problem List  ? Diagnosis Date Noted  ? Pain in left knee 06/04/2021  ? History of PCOS 02/21/2017  ? High risk medication use 09/21/2016  ? Rheumatoid arthritis involving multiple sites with positive rheumatoid factor ([place]) 09/21/2016  ? History of hypothyroidism 09/21/2016  ? History of depression 09/21/2016  ? ? ?Current Outpatient Medications on File Prior to Visit  ?Medication Sig Dispense Refill  ? acetaminophen (TYLENOL) 500 MG tablet Take 1,000 mg by mouth every 6 (six) hours as needed for mild pain.    ? docusate sodium (COLACE) 100 MG capsule Take 1 capsule (100 mg total) by mouth 2 (two) times daily as needed for mild constipation. 10 capsule 0  ? fluticasone (FLONASE) 50 MCG/ACT nasal spray Place 2 sprays into both nostrils daily.    ? folic acid (FOLVITE) 1 MG tablet TAKE 2 TABLETS BY MOUTH EVERY DAY 180 tablet 3  ? gabapentin (NEURONTIN) 300 MG capsule Take 1 capsule (300 mg total) by mouth at bedtime. 90 capsule 3  ? ibuprofen (ADVIL) 800 MG tablet Take 1 tablet (800 mg total) by mouth every 8 (eight) hours as needed. For mild pain and inflammation while you are off your rheumatologic meds 30 tablet 0  ? levothyroxine (SYNTHROID) 88 MCG tablet Take 88 mcg by mouth every morning.    ? methotrexate (RHEUMATREX) 2.5 MG tablet TAKE 8 TABLETS (20 MG TOTAL) BY MOUTH ONCE A WEEK. CAUTION:CHEMOTHERAPY. PROTECT FROM LIGHT. 96 tablet 0  ? ondansetron (ZOFRAN) 4 MG tablet Take 1 tablet (4 mg total) by mouth as needed. 30 tablet 1  ? PARoxetine (PAXIL) 40 MG tablet Take 40 mg by mouth every morning.    ? promethazine  (PHENERGAN) 12.5 MG tablet Take 1 tablet (12.5 mg total) by mouth every 6 (six) hours as needed for nausea or vomiting. 30 tablet 0  ? solifenacin (VESICARE) 5 MG tablet Take 5 mg by mouth daily.    ? SRONYX 0.1-20 MG-MCG tablet Take 1 tablet by mouth daily.    ? temazepam (RESTORIL) 15 MG capsule Take 15 mg by mouth at bedtime.    ? Upadacitinib ER (RINVOQ) 15 MG TB24 Take 15 mg by mouth daily. 90 tablet 0  ? ?No current facility-administered medications on file prior to visit.  ? ? ?Allergies  ?Allergen Reactions  ? Keflex [Cephalexin] Hives  ? ? ?Objective: ?There were no vitals filed for this visit. ? ?General: No acute distress, AAOx3  ?Right foot: Sutures and K wires intact with no gapping or dehiscence at surgical site, mild swelling to right forefoot, no erythema, no warmth, no drainage, no signs of infection noted, mild ecchymosis to the right forefoot, capillary fill time <3 seconds in all digits no pain with palpation to the lower leg or calf. ? ?Post Op Xray, right foot K wires intact rectus alignment of toes soft tissue swelling within normal limits for postoperative status. ? ?Assessment and Plan:  ?Problem List Items Addressed This Visit   ?None ?Visit Diagnoses   ? ? Acquired deformity of right toe    -  Primary  ? Hammer toe of right  foot      ? Rheumatoid arthritis involving both feet with positive rheumatoid factor ([place])      ? Relevant Medications  ? HYDROcodone-acetaminophen (NORCO) 10-325 MG tablet  ? S/P foot surgery, right      ? ?  ?  ? ?-Patient seen and evaluated ?-X-rays reviewed as above ?-Applied dry sterile dressing to surgical site right foot secured with ACE wrap and stockinet  ?-Advised patient to make sure to keep dressings clean, dry, and intact to lright surgical site, adjusting the Ace wrap as needed ?-Advised patient to continue with wedge surgical shoe and nonweightbearing with use of scooter to the right foot ?-Advised patient to limit activity to necessity  ?-Advised patient  to ice and elevate as directed ?-Refilled hydrocodone for patient to take as needed for more severe episodes of pain ?-Will plan for suture removal at next office visit. In the meantime, patient to call office if any issues or problems arise.  ? ?Landis Martins, DPM ? ?

## 2021-09-02 ENCOUNTER — Encounter: Payer: BC Managed Care – PPO | Admitting: Sports Medicine

## 2021-09-08 ENCOUNTER — Telehealth: Payer: Self-pay | Admitting: *Deleted

## 2021-09-08 ENCOUNTER — Ambulatory Visit (INDEPENDENT_AMBULATORY_CARE_PROVIDER_SITE_OTHER): Payer: BC Managed Care – PPO | Admitting: Sports Medicine

## 2021-09-08 ENCOUNTER — Other Ambulatory Visit: Payer: Self-pay

## 2021-09-08 ENCOUNTER — Ambulatory Visit (INDEPENDENT_AMBULATORY_CARE_PROVIDER_SITE_OTHER): Payer: BC Managed Care – PPO

## 2021-09-08 ENCOUNTER — Other Ambulatory Visit: Payer: Self-pay | Admitting: Sports Medicine

## 2021-09-08 ENCOUNTER — Encounter: Payer: Self-pay | Admitting: Sports Medicine

## 2021-09-08 DIAGNOSIS — M2041 Other hammer toe(s) (acquired), right foot: Secondary | ICD-10-CM | POA: Diagnosis not present

## 2021-09-08 DIAGNOSIS — M05772 Rheumatoid arthritis with rheumatoid factor of left ankle and foot without organ or systems involvement: Secondary | ICD-10-CM

## 2021-09-08 DIAGNOSIS — Z9889 Other specified postprocedural states: Secondary | ICD-10-CM

## 2021-09-08 DIAGNOSIS — M2061 Acquired deformities of toe(s), unspecified, right foot: Secondary | ICD-10-CM | POA: Diagnosis not present

## 2021-09-08 DIAGNOSIS — M05771 Rheumatoid arthritis with rheumatoid factor of right ankle and foot without organ or systems involvement: Secondary | ICD-10-CM

## 2021-09-08 NOTE — Telephone Encounter (Signed)
Patient called Saturday march 11th, 2023 and stated that she was having a rough morning and was stiff and could not move her legs or lift her arms due to patient has been off of the arthritis medicine since before surgery and wanted to see if she could start the arthritis medicine back and per Dr Marylene Land stated that the patient could start back on the arthritis medicine but not to continue with the ibuprofen and stated that when patient was getting up to use the scooter earlier and patient hit her surgery foot on it and took shoe off it felt like the pin was bent to the right and I stated to the patient do not unwrap it and stated that it sometimes can happen and when patient comes in for her post op we can get an x-ray to see if the pin has moved and I stated to call the office if any concerns or questions. Misty Stanley ?

## 2021-09-08 NOTE — Progress Notes (Signed)
Subjective: ?Shannon David is a 31 y.o. female patient seen today in office for POV # 2 (DOS 08-24-21), S/P Right first through fifth hammertoe repair and second through fifth met head resection. Patient reports that she is doing better this visit states that she did have an episode where she bumped her foot on the scooter and states that now she is back on her arthritis medicine it is helping. ?Patient is assisted by husband and daughter this visit. ? ?Patient Active Problem List  ? Diagnosis Date Noted  ? Pain in left knee 06/04/2021  ? History of PCOS 02/21/2017  ? High risk medication use 09/21/2016  ? Rheumatoid arthritis involving multiple sites with positive rheumatoid factor ([place]) 09/21/2016  ? History of hypothyroidism 09/21/2016  ? History of depression 09/21/2016  ? ? ?Current Outpatient Medications on File Prior to Visit  ?Medication Sig Dispense Refill  ? acetaminophen (TYLENOL) 500 MG tablet Take 1,000 mg by mouth every 6 (six) hours as needed for mild pain.    ? docusate sodium (COLACE) 100 MG capsule Take 1 capsule (100 mg total) by mouth 2 (two) times daily as needed for mild constipation. 10 capsule 0  ? fluticasone (FLONASE) 50 MCG/ACT nasal spray Place 2 sprays into both nostrils daily.    ? folic acid (FOLVITE) 1 MG tablet TAKE 2 TABLETS BY MOUTH EVERY DAY 180 tablet 3  ? gabapentin (NEURONTIN) 300 MG capsule Take 1 capsule (300 mg total) by mouth at bedtime. 90 capsule 3  ? ibuprofen (ADVIL) 800 MG tablet Take 1 tablet (800 mg total) by mouth every 8 (eight) hours as needed. For mild pain and inflammation while you are off your rheumatologic meds 30 tablet 0  ? levothyroxine (SYNTHROID) 88 MCG tablet Take 88 mcg by mouth every morning.    ? methotrexate (RHEUMATREX) 2.5 MG tablet TAKE 8 TABLETS (20 MG TOTAL) BY MOUTH ONCE A WEEK. CAUTION:CHEMOTHERAPY. PROTECT FROM LIGHT. 96 tablet 0  ? ondansetron (ZOFRAN) 4 MG tablet Take 1 tablet (4 mg total) by mouth as needed. 30 tablet 1  ? PARoxetine  (PAXIL) 40 MG tablet Take 40 mg by mouth every morning.    ? promethazine (PHENERGAN) 12.5 MG tablet Take 1 tablet (12.5 mg total) by mouth every 6 (six) hours as needed for nausea or vomiting. 30 tablet 0  ? solifenacin (VESICARE) 5 MG tablet Take 5 mg by mouth daily.    ? SRONYX 0.1-20 MG-MCG tablet Take 1 tablet by mouth daily.    ? temazepam (RESTORIL) 15 MG capsule Take 15 mg by mouth at bedtime.    ? Upadacitinib ER (RINVOQ) 15 MG TB24 Take 15 mg by mouth daily. 90 tablet 0  ? ?No current facility-administered medications on file prior to visit.  ? ? ?Allergies  ?Allergen Reactions  ? Keflex [Cephalexin] Hives  ? ? ?Objective: ?There were no vitals filed for this visit. ? ?General: No acute distress, AAOx3  ?Right foot: Sutures and K wires intact with no gapping or dehiscence at surgical site, mild swelling to right forefoot, no erythema, no warmth, no drainage, no signs of infection noted, mild ecchymosis to the right forefoot, capillary fill time <3 seconds in all digits no pain with palpation to the lower leg or calf. ? ?Post Op Xray, right foot K wires intact rectus alignment of toes soft tissue swelling within normal limits for postoperative status.  No evidence of acute fracture dislocation injury from patient bumping her foot. ? ?Assessment and Plan:  ?Problem List  Items Addressed This Visit   ?None ?Visit Diagnoses   ? ? Acquired deformity of right toe    -  Primary  ? Relevant Orders  ? DG Foot Complete Right  ? Hammer toe of right foot      ? 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 S/P foot surgery, right      ? ?  ?  ? ?-Patient seen and evaluated ?-X-rays reviewed as above ?-Sutures removed ?-Applied dry sterile dressing to surgical site right foot secured with ACE wrap and stockinet  ?-Advised patient to make sure to keep dressings clean, dry, and intact to lright surgical site, adjusting the Ace wrap as needed however on next week may change dressing  as I have directed ?-Advised patient to continue with wedge surgical shoe and nonweightbearing with use of scooter to the right foot and to be very careful not to bump or hit the foot began ?-Advised patient to limit activity to necessity  ?-Advised patient to ice and elevate as directed ?-Continue with arthritis medicine of methotrexate as prescribed by rheumatologist and hydrocodone for any breakthrough pain ?-Will plan for postop x-rays and K wire removal at next office visit. In the meantime, patient to call office if any issues or problems arise.  ? ?Landis Martins, DPM ? ?

## 2021-09-11 ENCOUNTER — Other Ambulatory Visit: Payer: Self-pay | Admitting: Physician Assistant

## 2021-09-11 DIAGNOSIS — Z79899 Other long term (current) drug therapy: Secondary | ICD-10-CM

## 2021-09-11 DIAGNOSIS — Z9225 Personal history of immunosupression therapy: Secondary | ICD-10-CM

## 2021-09-11 DIAGNOSIS — Z111 Encounter for screening for respiratory tuberculosis: Secondary | ICD-10-CM

## 2021-09-12 ENCOUNTER — Telehealth: Payer: Self-pay | Admitting: Internal Medicine

## 2021-09-12 DIAGNOSIS — M0579 Rheumatoid arthritis with rheumatoid factor of multiple sites without organ or systems involvement: Secondary | ICD-10-CM

## 2021-09-12 MED ORDER — PREDNISONE 5 MG PO TABS: ORAL_TABLET | ORAL | 0 refills | Status: AC

## 2021-09-12 NOTE — Telephone Encounter (Signed)
Ms. Bevelyn Buckleslalusi called on call number reporting she has significant increase in joint pain and stiffness of multiple areas worsening after her recent foot surgery last month. She resumed her rinvoq and methotrexate after the surgery but has ongoing symptoms. No specific joint that is worse affected more generalized symptoms. She previously responded well with moderate dose of prednisone taper for RA flare ups in the past. Rx sent for prednisone 20 mg daily taper over 12 days and will send message she can follow up with Dr. Corliss Skainseveshwar during next week if not improving well. ?

## 2021-09-14 ENCOUNTER — Other Ambulatory Visit: Payer: Self-pay | Admitting: *Deleted

## 2021-09-14 DIAGNOSIS — Z79899 Other long term (current) drug therapy: Secondary | ICD-10-CM

## 2021-09-14 NOTE — Telephone Encounter (Signed)
Next Visit: 10/01/2021 ?  ?Last Visit: 05/28/2021 ?  ?Last Fill: 01/13/2021 ? ?[id number] arthritis involving multiple sites with positive rheumatoid factor  ?  ?Current Dose per office note 05/28/2021: Methotrexate 8 tablets by mouth once weekly ? ?Labs: 05/28/2021 CBC and CMP are normal. ? ?Patient advised she is due to update labs. Patient states she will update them today.  ? ?Okay to refill MTX?  ?

## 2021-09-15 LAB — CBC WITH DIFFERENTIAL/PLATELET
Basophils Absolute: 0 10*3/uL (ref 0.0–0.2)
Basos: 0 %
EOS (ABSOLUTE): 0 10*3/uL (ref 0.0–0.4)
Eos: 0 %
Hematocrit: 33.8 % — ABNORMAL LOW (ref 34.0–46.6)
Hemoglobin: 11.6 g/dL (ref 11.1–15.9)
Immature Grans (Abs): 0 10*3/uL (ref 0.0–0.1)
Immature Granulocytes: 0 %
Lymphocytes Absolute: 2.1 10*3/uL (ref 0.7–3.1)
Lymphs: 43 %
MCH: 31.1 pg (ref 26.6–33.0)
MCHC: 34.3 g/dL (ref 31.5–35.7)
MCV: 91 fL (ref 79–97)
Monocytes Absolute: 0.5 10*3/uL (ref 0.1–0.9)
Monocytes: 11 %
Neutrophils Absolute: 2.1 10*3/uL (ref 1.4–7.0)
Neutrophils: 46 %
Platelets: 406 10*3/uL (ref 150–450)
RBC: 3.73 x10E6/uL — ABNORMAL LOW (ref 3.77–5.28)
RDW: 12.1 % (ref 11.7–15.4)
WBC: 4.7 10*3/uL (ref 3.4–10.8)

## 2021-09-15 LAB — CMP14+EGFR
ALT: 16 IU/L (ref 0–32)
AST: 17 IU/L (ref 0–40)
Albumin/Globulin Ratio: 1.2 (ref 1.2–2.2)
Albumin: 4 g/dL (ref 3.8–4.8)
Alkaline Phosphatase: 93 IU/L (ref 44–121)
BUN/Creatinine Ratio: 26 — ABNORMAL HIGH (ref 9–23)
BUN: 16 mg/dL (ref 6–20)
Bilirubin Total: <0.2 mg/dL (ref 0.0–1.2)
CO2: 22 mmol/L (ref 20–29)
Calcium: 9.3 mg/dL (ref 8.7–10.2)
Chloride: 101 mmol/L (ref 96–106)
Creatinine, Ser: 0.61 mg/dL (ref 0.57–1.00)
Globulin, Total: 3.4 g/dL (ref 1.5–4.5)
Glucose: 76 mg/dL (ref 70–99)
Potassium: 4.3 mmol/L (ref 3.5–5.2)
Sodium: 137 mmol/L (ref 134–144)
Total Protein: 7.4 g/dL (ref 6.0–8.5)
eGFR: 122 mL/min/{1.73_m2} (ref >59)

## 2021-09-15 NOTE — Progress Notes (Signed)
CBC and CMP stable.

## 2021-09-17 NOTE — Progress Notes (Signed)
? ?Office Visit Note ? ?Patient: Shannon David             ?Date of Birth: 1991/02/10           ?MRN: [id number]             ?PCP: Janine Limbo, PA-C ?Referring: Janine Limbo, PA-C ?Visit Date: 10/01/2021 ?Occupation: @GUAROCC @ ? ?Subjective:  ?Other (Patient recently had surgery on her right foot and she held medications for approximately 1 month total. Patient has noticed changes in left hand and neck pain while medications were on hold. ) ? ? ?History of Present Illness: Shannon David is a 31 y.o. female history of rheumatoid arthritis.  She underwent reconstruction surgery on her right foot on August 24, 2021.  She had right first through fifth hammertoe repair and second through fifth metatarsal head resection.  She was off Rinvoq and methotrexate for about a month.  She had increased pain and flares during that time.  She resumed both medications on September 22, 2021.  She is experiencing pain in her left shoulder and bilateral hands.  She states that her foot is recovering well. ? ?Activities of Daily Living:  ?Patient reports morning stiffness for 1 hour.   ?Patient Reports nocturnal pain.  ?Difficulty dressing/grooming: Reports ?Difficulty climbing stairs: Reports ?Difficulty getting out of chair: Reports ?Difficulty using hands for taps, buttons, cutlery, and/or writing: Reports ? ?Review of Systems  ?Constitutional:  Positive for fatigue.  ?HENT:  Negative for mouth sores, mouth dryness and nose dryness.   ?Eyes:  Negative for pain, itching and dryness.  ?Respiratory:  Negative for shortness of breath and difficulty breathing.   ?Cardiovascular:  Negative for chest pain and palpitations.  ?Gastrointestinal:  Negative for blood in stool, constipation and diarrhea.  ?Endocrine: Negative for increased urination.  ?Genitourinary:  Negative for difficulty urinating.  ?Musculoskeletal:  Positive for joint pain, joint pain, joint swelling, myalgias, morning stiffness, muscle tenderness and myalgias.  ?Skin:   Negative for color change, rash and redness.  ?Allergic/Immunologic: Positive for susceptible to infections.  ?Neurological:  Positive for weakness. Negative for dizziness, numbness, headaches and memory loss.  ?Hematological:  Positive for bruising/bleeding tendency.  ?Psychiatric/Behavioral:  Negative for confusion.   ? ?PMFS History:  ?Patient Active Problem List  ? Diagnosis Date Noted  ? Pain in left knee 06/04/2021  ? History of PCOS 02/21/2017  ? High risk medication use 09/21/2016  ? Rheumatoid arthritis involving multiple sites with positive rheumatoid factor ([place]) 09/21/2016  ? History of hypothyroidism 09/21/2016  ? History of depression 09/21/2016  ?  ?Past Medical History:  ?Diagnosis Date  ? Arthritis   ? Rheumatoid arthritis ([place])   ?  ?Family History  ?Problem Relation Age of Onset  ? Hypertension Mother   ? Clotting disorder Mother   ? Cancer Maternal Grandmother   ?     lung  ? ?Past Surgical History:  ?Procedure Laterality Date  ? BUNIONECTOMY Right   ? FOOT SURGERY Right 08/24/2021  ? ?Social History  ? ?Social History Narrative  ? Left Handed   ? Lives in a one story home  ? Drinks Caffeine   ? ? ?There is no immunization history on file for this patient.  ? ?Objective: ?Vital Signs: BP 120/86 (BP Location: Left Arm, Patient Position: Sitting, Cuff Size: Large)   Pulse 94   Ht 5\' 2"  (1.575 m)   Wt 243 lb (110.2 kg)   BMI 44.45 kg/m?   ? ?Physical Exam ?Vitals and nursing  note reviewed.  ?Constitutional:   ?   Appearance: She is well-developed.  ?HENT:  ?   Head: Normocephalic and atraumatic.  ?Eyes:  ?   Conjunctiva/sclera: Conjunctivae normal.  ?Cardiovascular:  ?   Rate and Rhythm: Normal rate and regular rhythm.  ?   Heart sounds: Normal heart sounds.  ?Pulmonary:  ?   Effort: Pulmonary effort is normal.  ?   Breath sounds: Normal breath sounds.  ?Abdominal:  ?   General: Bowel sounds are normal.  ?   Palpations: Abdomen is soft.  ?Musculoskeletal:  ?   Cervical back: Normal range of  motion.  ?Lymphadenopathy:  ?   Cervical: No cervical adenopathy.  ?Skin: ?   General: Skin is warm and dry.  ?   Capillary Refill: Capillary refill takes less than 2 seconds.  ?Neurological:  ?   Mental Status: She is alert and oriented to person, place, and time.  ?Psychiatric:     ?   Behavior: Behavior normal.  ?  ? ?Musculoskeletal Exam: C-spine was in good range of motion.  Shoulder joints, elbow joints and wrist joints with good range of motion.  She had bilateral MCP thickening and ulnar deviation.  She had PIP thickening.  Hip joints and knee joints in good range of motion.  Her right foot was in shoe due to recent surgery.  There was no tenderness over left ankle and left MTPs. ? ?CDAI Exam: ?CDAI Score: 0.8  ?Patient Global: 4 mm; Provider Global: 4 mm ?Swollen: 0 ; Tender: 0  ?Joint Exam 10/01/2021  ? ?No joint exam has been documented for this visit  ? ?There is currently no information documented on the homunculus. Go to the Rheumatology activity and complete the homunculus joint exam. ? ?Investigation: ?No additional findings. ? ?Imaging: ?DG Foot Complete Right ? ?Result Date: 09/22/2021 ?Please see detailed radiograph report in office note. ? ?DG Foot Complete Right ? ?Result Date: 09/08/2021 ?Please see detailed radiograph report in office note. ? ?DG Foot Complete Right ? ?Result Date: 09/01/2021 ?Please see detailed radiograph report in office note.  ? ?Recent Labs: ?Lab Results  ?Component Value Date  ? WBC 4.7 09/14/2021  ? HGB 11.6 09/14/2021  ? PLT 406 09/14/2021  ? NA 137 09/14/2021  ? K 4.3 09/14/2021  ? CL 101 09/14/2021  ? CO2 22 09/14/2021  ? GLUCOSE 76 09/14/2021  ? BUN 16 09/14/2021  ? CREATININE 0.61 09/14/2021  ? BILITOT <0.2 09/14/2021  ? ALKPHOS 93 09/14/2021  ? AST 17 09/14/2021  ? ALT 16 09/14/2021  ? PROT 7.4 09/14/2021  ? ALBUMIN 4.0 09/14/2021  ? CALCIUM 9.3 09/14/2021  ? GFRAA 134 07/18/2020  ? QFTBGOLD Negative 08/13/2016  ? QFTBGOLDPLUS Negative 10/21/2020  ? ? ?Speciality  Comments: PLQ Eye Exam: 01/16/2021 [facility]NL @ [facility] Follow up in 6 months.  ? ?Prior therapy: Orencia (inadequate response 9/18-9/19), Enbrel (inadequate response 3/15/5/15), Humira (inadequate response 11/17-3/18), and injectable methotrexate (non-compliance) ? ?Procedures:  ?No procedures performed ?Allergies: Keflex [cephalexin]  ? ?Assessment / Plan:     ?Visit Diagnoses: Rheumatoid arthritis involving multiple sites with positive rheumatoid factor (HCC)-she has severe rheumatoid arthritis involving multiple joints.  She had to come off Rinvoq and methotrexate for her right foot reconstructive surgery.  She states she had a flare during that time with neck pain and increased pain and swelling in her hands.  She resumed Rinvoq and methotrexate few weeks back and the symptoms are improving.  She has ulnar deviation  and synovial thickening over bilateral MCP joints.  She also has incomplete fist formation.  No active synovitis was noted.  I believe she is doing well on the combination therapy.  We will continue current treatment. ? ?High risk medication use - Rinvoq 15 mg 1 tablet by mouth daily, Methotrexate 8 tablets by mouth once weekly, and folic acid 2 mg daily.  Labs obtained on September 24, 2021 showed normal CBC and CMP.  Her hemoglobin was low at 11.6.  TB gold was negative on October 21, 2020.  We will obtain TB Gold today.- Plan: QuantiFERON-TB Gold Plus.  She was advised to get labs every 3 months to monitor for drug toxicity.  She is also advised to hold Rinvoq and methotrexate in case she develops an infection and resume after the infection resolves.  Information on immunization was placed in the AVS. ? ?Chronic left shoulder pain-she has off-and-on discomfort in her left shoulder.  She had good range of motion of the left shoulder today. ? 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 ? ?Primary  osteoarthritis of both knees-she has off-and-on discomfort in her knee joints. ? 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 Subluxation of all metatarsal joints of right foot and 2nd

## 2021-09-22 ENCOUNTER — Ambulatory Visit (INDEPENDENT_AMBULATORY_CARE_PROVIDER_SITE_OTHER): Payer: BC Managed Care – PPO | Admitting: Sports Medicine

## 2021-09-22 ENCOUNTER — Other Ambulatory Visit: Payer: Self-pay

## 2021-09-22 ENCOUNTER — Other Ambulatory Visit: Payer: Self-pay | Admitting: Sports Medicine

## 2021-09-22 ENCOUNTER — Ambulatory Visit (INDEPENDENT_AMBULATORY_CARE_PROVIDER_SITE_OTHER): Payer: BC Managed Care – PPO

## 2021-09-22 DIAGNOSIS — M2041 Other hammer toe(s) (acquired), right foot: Secondary | ICD-10-CM

## 2021-09-22 DIAGNOSIS — M2061 Acquired deformities of toe(s), unspecified, right foot: Secondary | ICD-10-CM

## 2021-09-22 DIAGNOSIS — M05771 Rheumatoid arthritis with rheumatoid factor of right ankle and foot without organ or systems involvement: Secondary | ICD-10-CM

## 2021-09-22 DIAGNOSIS — Z9889 Other specified postprocedural states: Secondary | ICD-10-CM

## 2021-09-22 DIAGNOSIS — M05772 Rheumatoid arthritis with rheumatoid factor of left ankle and foot without organ or systems involvement: Secondary | ICD-10-CM

## 2021-09-22 NOTE — Progress Notes (Signed)
Subjective: ?Shannon David is a 31 y.o. female patient seen today in office for POV #3 (DOS 08-24-21), S/P Right first through fifth hammertoe repair and second through fifth met head resection. Patient reports that she is doing okay states that she had to put be put back on prednisone for her rheumatoid arthritis. ?Patient is assisted by husband this visit. ? ?Patient Active Problem List  ? Diagnosis Date Noted  ? Pain in left knee 06/04/2021  ? History of PCOS 02/21/2017  ? High risk medication use 09/21/2016  ? Rheumatoid arthritis involving multiple sites with positive rheumatoid factor ([place]) 09/21/2016  ? History of hypothyroidism 09/21/2016  ? History of depression 09/21/2016  ? ? ?Current Outpatient Medications on File Prior to Visit  ?Medication Sig Dispense Refill  ? acetaminophen (TYLENOL) 500 MG tablet Take 1,000 mg by mouth every 6 (six) hours as needed for mild pain.    ? docusate sodium (COLACE) 100 MG capsule Take 1 capsule (100 mg total) by mouth 2 (two) times daily as needed for mild constipation. 10 capsule 0  ? fluticasone (FLONASE) 50 MCG/ACT nasal spray Place 2 sprays into both nostrils daily.    ? folic acid (FOLVITE) 1 MG tablet TAKE 2 TABLETS BY MOUTH EVERY DAY 180 tablet 3  ? gabapentin (NEURONTIN) 300 MG capsule Take 1 capsule (300 mg total) by mouth at bedtime. 90 capsule 3  ? ibuprofen (ADVIL) 800 MG tablet Take 1 tablet (800 mg total) by mouth every 8 (eight) hours as needed. For mild pain and inflammation while you are off your rheumatologic meds 30 tablet 0  ? levothyroxine (SYNTHROID) 88 MCG tablet Take 88 mcg by mouth every morning.    ? methotrexate (RHEUMATREX) 2.5 MG tablet TAKE 8 TABLETS (20 MG TOTAL) BY MOUTH ONCE A WEEK. CAUTION:CHEMOTHERAPY. PROTECT FROM LIGHT. 96 tablet 0  ? ondansetron (ZOFRAN) 4 MG tablet Take 1 tablet (4 mg total) by mouth as needed. 30 tablet 1  ? PARoxetine (PAXIL) 40 MG tablet Take 40 mg by mouth every morning.    ? predniSONE (DELTASONE) 5 MG tablet  Take 4 tablets (20 mg total) by mouth daily with breakfast for 3 days, THEN 3 tablets (15 mg total) daily with breakfast for 3 days, THEN 2 tablets (10 mg total) daily with breakfast for 3 days, THEN 1 tablet (5 mg total) daily with breakfast for 3 days. 30 tablet 0  ? promethazine (PHENERGAN) 12.5 MG tablet Take 1 tablet (12.5 mg total) by mouth every 6 (six) hours as needed for nausea or vomiting. 30 tablet 0  ? solifenacin (VESICARE) 5 MG tablet Take 5 mg by mouth daily.    ? SRONYX 0.1-20 MG-MCG tablet Take 1 tablet by mouth daily.    ? temazepam (RESTORIL) 15 MG capsule Take 15 mg by mouth at bedtime.    ? Upadacitinib ER (RINVOQ) 15 MG TB24 Take 15 mg by mouth daily. 90 tablet 0  ? ?No current facility-administered medications on file prior to visit.  ? ? ?Allergies  ?Allergen Reactions  ? Keflex [Cephalexin] Hives  ? ? ?Objective: ?There were no vitals filed for this visit. ? ?General: No acute distress, AAOx3  ?Right foot: K wires intact with no gapping or dehiscence at surgical site, mild swelling to right forefoot, no erythema, no warmth, no drainage, no signs of infection noted, minimal ecchymosis to the right forefoot, capillary fill time <3 seconds in all digits no pain with palpation to the lower leg or calf. ? ?Post Op  Xray, right foot K wires intact rectus alignment of toes soft tissue swelling within normal limits for postoperative status.  ? ?Assessment and Plan:  ?Problem List Items Addressed This Visit   ?None ?Visit Diagnoses   ? ? Acquired deformity of right toe    -  Primary  ? Relevant Orders  ? DG Foot Complete Right  ? Hammer toe of right foot      ? 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 S/P foot surgery, right      ? ?  ?  ? ?-Patient seen and evaluated ?-X-rays reviewed as above ?-K wires removed ?-Applied Steri-Strips and a stockinette ?-May remove above dressing and shower on tomorrow and replaced with a clean sock and allow Steri-Strips  to fall off on their own ?-Exchanged wedge surgical shoe for flat surgical shoe patient may weight-bear to tolerance however did advise patient to use scooter for longer distances ?-Advised patient to limit activity to tolerance in 2 weeks may attempt driving however must remove surgical shoe to do so ?-Advised patient to ice and elevate as needed ?-Continue with prednisone and methotrexate as given by rheumatologist ?-Will plan for increasing activities and adding on physical therapy if needed at next visit.  Patient to call office if any issues or problems arise.  ? ?Landis Martins, DPM ? ?

## 2021-09-30 ENCOUNTER — Other Ambulatory Visit: Payer: Self-pay | Admitting: Physician Assistant

## 2021-09-30 DIAGNOSIS — M0579 Rheumatoid arthritis with rheumatoid factor of multiple sites without organ or systems involvement: Secondary | ICD-10-CM

## 2021-09-30 NOTE — Telephone Encounter (Signed)
Next Visit: 10/01/2021 ?  ?Last Visit: 05/28/2021 ?  ?Last Fill: 07/20/2021 ?  ?[id number] arthritis involving multiple sites with positive rheumatoid factor  ?  ?Current Dose per office note 05/28/2021: Rinvoq 15 mg 1 tablet by mouth daily ?  ?Labs: 09/14/2021 CBC and CMP stable ? ?TB Gold: 10/21/2020 Neg   ?  ?Okay to refill Rinvoq?  ?

## 2021-10-01 ENCOUNTER — Encounter: Payer: Self-pay | Admitting: Rheumatology

## 2021-10-01 ENCOUNTER — Ambulatory Visit: Payer: BC Managed Care – PPO | Admitting: Rheumatology

## 2021-10-01 VITALS — BP 120/86 | HR 94 | Ht 62.0 in | Wt 243.0 lb

## 2021-10-01 DIAGNOSIS — Z8742 Personal history of other diseases of the female genital tract: Secondary | ICD-10-CM

## 2021-10-01 DIAGNOSIS — Z8659 Personal history of other mental and behavioral disorders: Secondary | ICD-10-CM

## 2021-10-01 DIAGNOSIS — Z8639 Personal history of other endocrine, nutritional and metabolic disease: Secondary | ICD-10-CM

## 2021-10-01 DIAGNOSIS — M21942 Unspecified acquired deformity of hand, left hand: Secondary | ICD-10-CM

## 2021-10-01 DIAGNOSIS — M21961 Unspecified acquired deformity of right lower leg: Secondary | ICD-10-CM

## 2021-10-01 DIAGNOSIS — Z79899 Other long term (current) drug therapy: Secondary | ICD-10-CM | POA: Diagnosis not present

## 2021-10-01 DIAGNOSIS — M21941 Unspecified acquired deformity of hand, right hand: Secondary | ICD-10-CM | POA: Diagnosis not present

## 2021-10-01 DIAGNOSIS — M0579 Rheumatoid arthritis with rheumatoid factor of multiple sites without organ or systems involvement: Secondary | ICD-10-CM | POA: Diagnosis not present

## 2021-10-01 DIAGNOSIS — M25512 Pain in left shoulder: Secondary | ICD-10-CM | POA: Diagnosis not present

## 2021-10-01 DIAGNOSIS — M170 Bilateral primary osteoarthritis of knee: Secondary | ICD-10-CM

## 2021-10-01 DIAGNOSIS — G8929 Other chronic pain: Secondary | ICD-10-CM

## 2021-10-01 DIAGNOSIS — M069 Rheumatoid arthritis, unspecified: Secondary | ICD-10-CM

## 2021-10-01 DIAGNOSIS — M21962 Unspecified acquired deformity of left lower leg: Secondary | ICD-10-CM

## 2021-10-01 DIAGNOSIS — M25562 Pain in left knee: Secondary | ICD-10-CM

## 2021-10-01 NOTE — Patient Instructions (Signed)
Standing Labs ?We placed an order today for your standing lab work.  ? ?Please have your standing labs drawn in June and every 3 months ? ?If possible, please have your labs drawn 2 weeks prior to your appointment so that the provider can discuss your results at your appointment. ? ?Please note that you may see your imaging and lab results in MyChart before we have reviewed them. ?We may be awaiting multiple results to interpret others before contacting you. ?Please allow our office up to 72 hours to thoroughly review all of the results before contacting the office for clarification of your results. ? ?We have open lab daily: ?Monday through Thursday from 1:30-4:30 PM and Friday from 1:30-4:00 PM ?at the office of Dr. Isidora Laham, [facility].   ?Please be advised, all patients with office appointments requiring lab work will take precedent over walk-in lab work.  ?If possible, please come for your lab work on Monday and Friday afternoons, as you may experience shorter wait times. ?The office is located at [address], [address], [place], [place] [address] ?No appointment is necessary.   ?Labs are drawn by [facility]. Please bring your co-pay at the time of your lab draw.  You may receive a bill from [facility] for your lab work. ? ?Please note if you are on Hydroxychloroquine and and an order has been placed for a Hydroxychloroquine level, you will need to have it drawn 4 hours or more after your last dose. ? ?If you wish to have your labs drawn at another location, please call the office 24 hours in advance to send orders. ? ?If you have any questions regarding directions or hours of operation,  ?please call [phone].   ?As a reminder, please drink plenty of water prior to coming for your lab work. Thanks!  ? ?Vaccines ?You are taking a medication(s) that can suppress your immune system.  The following immunizations are recommended: ?Flu annually ?Covid-19  ?Td/Tdap (tetanus, diphtheria, pertussis)  every 10 years ?Pneumonia (Prevnar 15 then Pneumovax 23 at least 1 year apart.  Alternatively, can take Prevnar 20 without needing additional dose) ?Shingrix: 2 doses from 4 weeks to 6 months apart ? ?Please check with your PCP to make sure you are up to date.  ? ?If you have signs or symptoms of an infection or start antibiotics: ?First, call your PCP for workup of your infection. ?Hold your medication through the infection, until you complete your antibiotics, and until symptoms resolve if you take the following: ?Injectable medication (Actemra, Benlysta, Cimzia, Cosentyx, Enbrel, Humira, Kevzara, Orencia, Remicade, Simponi, Stelara, Taltz, Tremfya) ?Methotrexate ?Leflunomide (Arava) ?Mycophenolate (Cellcept) ?Xeljanz, Olumiant, or Rinvoq  ?

## 2021-10-04 LAB — QUANTIFERON-TB GOLD PLUS
Mitogen-NIL: 2.7 IU/mL
NIL: 0.03 IU/mL
QuantiFERON-TB Gold Plus: NEGATIVE
TB1-NIL: 0.01 IU/mL
TB2-NIL: 0 IU/mL

## 2021-10-12 ENCOUNTER — Other Ambulatory Visit: Payer: Self-pay | Admitting: Rheumatology

## 2021-10-12 DIAGNOSIS — M0680 Other specified rheumatoid arthritis, unspecified site: Secondary | ICD-10-CM | POA: Diagnosis not present

## 2021-10-12 DIAGNOSIS — E039 Hypothyroidism, unspecified: Secondary | ICD-10-CM | POA: Diagnosis not present

## 2021-10-12 MED ORDER — PREDNISONE 5 MG PO TABS: ORAL_TABLET | ORAL | 0 refills | Status: DC

## 2021-10-12 NOTE — Telephone Encounter (Signed)
Attempted to contact patient and left message for patient to call the office.  

## 2021-10-12 NOTE — Telephone Encounter (Signed)
Ok to send in prednisone taper starting at 20 mg tapering by 5 mg every 4 days.  Please advise the patient to avoid the use of NSAIDs while taking prednisone. She should take prednisone first thing in the morning with food. Please advise the patient to notify us if her symptoms persist or worsen and she can schedule a sooner office visit.

## 2021-10-12 NOTE — Telephone Encounter (Signed)
Patient called the office stating she is having a lot of pain in her hands and shoulders at night. Patient states its keeping her awake. Patient would like a return call on what she can do. [phone] ?

## 2021-10-12 NOTE — Telephone Encounter (Signed)
Patient advised prednisone taper starting at 20 mg tapering by 5 mg every 4 days sent to the pharmacy.  Patient advised to avoid the use of NSAIDs while taking prednisone. She should take prednisone first thing in the morning with food. Please advise the patient to notify us if her symptoms persist or worsen and she can schedule a sooner office visit.  ?

## 2021-10-12 NOTE — Telephone Encounter (Signed)
Patient states she is having increased pain in her shoulders. Patient states she is also having increased swelling in her fingers and hands especially at night. Patient states it is interfering with her sleep. Patient states it has been going on her about a week and was worse last night. Patient states she has she has been back on her MTX and Rinvoq for about 1 month. Patient had held them due to surgery. Please advise.  ?

## 2021-10-12 NOTE — Telephone Encounter (Signed)
Attempted to contact the patient and left message for patient to call the office.  

## 2021-10-16 ENCOUNTER — Ambulatory Visit (INDEPENDENT_AMBULATORY_CARE_PROVIDER_SITE_OTHER): Payer: BC Managed Care – PPO | Admitting: Sports Medicine

## 2021-10-16 ENCOUNTER — Encounter: Payer: Self-pay | Admitting: Sports Medicine

## 2021-10-16 DIAGNOSIS — M2061 Acquired deformities of toe(s), unspecified, right foot: Secondary | ICD-10-CM

## 2021-10-16 DIAGNOSIS — Z9889 Other specified postprocedural states: Secondary | ICD-10-CM

## 2021-10-16 DIAGNOSIS — M05771 Rheumatoid arthritis with rheumatoid factor of right ankle and foot without organ or systems involvement: Secondary | ICD-10-CM

## 2021-10-16 DIAGNOSIS — M2041 Other hammer toe(s) (acquired), right foot: Secondary | ICD-10-CM

## 2021-10-16 DIAGNOSIS — M05772 Rheumatoid arthritis with rheumatoid factor of left ankle and foot without organ or systems involvement: Secondary | ICD-10-CM

## 2021-10-16 DIAGNOSIS — M19079 Primary osteoarthritis, unspecified ankle and foot: Secondary | ICD-10-CM

## 2021-10-16 MED ORDER — GABAPENTIN 600 MG PO TABS: 1200.0000 mg | ORAL_TABLET | Freq: Three times a day (TID) | ORAL | 0 refills | Status: DC

## 2021-10-16 NOTE — Progress Notes (Signed)
Subjective: ?Shannon David is a 31 y.o. female patient seen today in office for POV #4 (DOS 08-24-21), S/P Right first through fifth hammertoe repair and second through fifth met head resection. Patient reports that she is doing good from her normal shoe and orthotics. ? ?Patient is assisted by husband this visit. ? ? ?Patient is being followed by rheumatology. ? ?Patient Active Problem List  ? Diagnosis Date Noted  ? Pain in left knee 06/04/2021  ? History of PCOS 02/21/2017  ? High risk medication use 09/21/2016  ? Rheumatoid arthritis involving multiple sites with positive rheumatoid factor ([place]) 09/21/2016  ? History of hypothyroidism 09/21/2016  ? History of depression 09/21/2016  ? ? ?Current Outpatient Medications on File Prior to Visit  ?Medication Sig Dispense Refill  ? acetaminophen (TYLENOL) 500 MG tablet Take 1,000 mg by mouth every 6 (six) hours as needed for mild pain.    ? fluticasone (FLONASE) 50 MCG/ACT nasal spray Place 2 sprays into both nostrils daily.    ? folic acid (FOLVITE) 1 MG tablet TAKE 2 TABLETS BY MOUTH EVERY DAY 180 tablet 3  ? ibuprofen (ADVIL) 800 MG tablet Take 1 tablet (800 mg total) by mouth every 8 (eight) hours as needed. For mild pain and inflammation while you are off your rheumatologic meds 30 tablet 0  ? levothyroxine (SYNTHROID) 88 MCG tablet Take 88 mcg by mouth every morning.    ? methotrexate (RHEUMATREX) 2.5 MG tablet TAKE 8 TABLETS (20 MG TOTAL) BY MOUTH ONCE A WEEK. CAUTION:CHEMOTHERAPY. PROTECT FROM LIGHT. 96 tablet 0  ? nitrofurantoin, macrocrystal-monohydrate, (MACROBID) 100 MG capsule Take 100 mg by mouth 2 (two) times daily.    ? ondansetron (ZOFRAN) 4 MG tablet Take 1 tablet (4 mg total) by mouth as needed. 30 tablet 1  ? PARoxetine (PAXIL) 40 MG tablet Take 40 mg by mouth every morning.    ? phenazopyridine (PYRIDIUM) 100 MG tablet SMARTSIG:1 Tablet(s) By Mouth 1-3 Times Daily    ? predniSONE (DELTASONE) 5 MG tablet Take 4 tabs po x 4 days, 3  tabs po x 4  days, 2  tabs po x 4 days, 1  tab po x 4 days 40 tablet 0  ? promethazine (PHENERGAN) 12.5 MG tablet Take 1 tablet (12.5 mg total) by mouth every 6 (six) hours as needed for nausea or vomiting. 30 tablet 0  ? RINVOQ 15 MG TB24 TAKE 1 TABLET BY MOUTH 1 TIME A DAY. 90 tablet 0  ? SRONYX 0.1-20 MG-MCG tablet Take 1 tablet by mouth daily.    ? ?No current facility-administered medications on file prior to visit.  ? ? ?Allergies  ?Allergen Reactions  ? Keflex [Cephalexin] Hives  ? ? ?Objective: ?There were no vitals filed for this visit. ? ?General: No acute distress, AAOx3  ?Right foot: Incisions well-healed, no gapping or dehiscence at surgical site, mild swelling to right forefoot, no erythema, no warmth, no drainage, no signs of infection noted, minimal ecchymosis to the right forefoot, capillary fill time <3 seconds in all digits no pain with palpation to the lower leg or calf. ? ?Assessment and Plan:  ?Problem List Items Addressed This Visit   ?None ?Visit Diagnoses   ? ? S/P foot surgery, right    -  Primary  ? 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 Arthritis of foot      ? Acquired deformity of right toe      ? Hammer toe  of right foot      ? ?  ?  ? ?-Patient seen and evaluated ?-Incision healing well; may start scar creams right foot  ?-May transition to normal shoe as tolerated; next visit to bring her brooks for me to fit her orthotics in them next visit ?- Continue with rheumatology follow-up ?-Return to office in 2 weeks or sooner if problems or issues arise.  ? ?Landis Martins, DPM ? ?

## 2021-10-18 ENCOUNTER — Encounter: Payer: Self-pay | Admitting: Rheumatology

## 2021-10-19 NOTE — Telephone Encounter (Signed)
Please clarify if she started the prednisone taper?

## 2021-10-22 ENCOUNTER — Ambulatory Visit: Payer: BC Managed Care – PPO | Admitting: Rheumatology

## 2021-10-30 ENCOUNTER — Ambulatory Visit (INDEPENDENT_AMBULATORY_CARE_PROVIDER_SITE_OTHER): Payer: BC Managed Care – PPO | Admitting: Sports Medicine

## 2021-10-30 ENCOUNTER — Encounter: Payer: Self-pay | Admitting: Sports Medicine

## 2021-10-30 DIAGNOSIS — M2061 Acquired deformities of toe(s), unspecified, right foot: Secondary | ICD-10-CM

## 2021-10-30 DIAGNOSIS — M792 Neuralgia and neuritis, unspecified: Secondary | ICD-10-CM

## 2021-10-30 DIAGNOSIS — M2041 Other hammer toe(s) (acquired), right foot: Secondary | ICD-10-CM

## 2021-10-30 DIAGNOSIS — M19079 Primary osteoarthritis, unspecified ankle and foot: Secondary | ICD-10-CM

## 2021-10-30 DIAGNOSIS — Z9889 Other specified postprocedural states: Secondary | ICD-10-CM

## 2021-10-30 DIAGNOSIS — M05771 Rheumatoid arthritis with rheumatoid factor of right ankle and foot without organ or systems involvement: Secondary | ICD-10-CM

## 2021-10-30 DIAGNOSIS — M05772 Rheumatoid arthritis with rheumatoid factor of left ankle and foot without organ or systems involvement: Secondary | ICD-10-CM

## 2021-10-30 NOTE — Progress Notes (Signed)
Subjective: ?Shannon David is a 31 y.o. female patient seen today in office for POV #5 (DOS 08-24-21), S/P Right first through fifth hammertoe repair and second through fifth met head resection. Patient reports that she is doing good little swelling and brought new shoes and her orthotics.  ? ?Patient is assisted by husband this visit. ? ?Patient Active Problem List  ? Diagnosis Date Noted  ? Pain in left knee 06/04/2021  ? History of PCOS 02/21/2017  ? High risk medication use 09/21/2016  ? Rheumatoid arthritis involving multiple sites with positive rheumatoid factor ([place]) 09/21/2016  ? History of hypothyroidism 09/21/2016  ? History of depression 09/21/2016  ? ? ?Current Outpatient Medications on File Prior to Visit  ?Medication Sig Dispense Refill  ? acetaminophen (TYLENOL) 500 MG tablet Take 1,000 mg by mouth every 6 (six) hours as needed for mild pain.    ? Cholecalciferol (VITAMIN D3) 50 MCG (2000 UT) capsule Take 2,000 Units by mouth daily.    ? fluticasone (FLONASE) 50 MCG/ACT nasal spray Place 2 sprays into both nostrils daily.    ? folic acid (FOLVITE) 1 MG tablet TAKE 2 TABLETS BY MOUTH EVERY DAY 180 tablet 3  ? ibuprofen (ADVIL) 800 MG tablet Take 1 tablet (800 mg total) by mouth every 8 (eight) hours as needed. For mild pain and inflammation while you are off your rheumatologic meds 30 tablet 0  ? levothyroxine (SYNTHROID) 88 MCG tablet Take 88 mcg by mouth every morning.    ? methotrexate (RHEUMATREX) 2.5 MG tablet TAKE 8 TABLETS (20 MG TOTAL) BY MOUTH ONCE A WEEK. CAUTION:CHEMOTHERAPY. PROTECT FROM LIGHT. 96 tablet 0  ? nitrofurantoin, macrocrystal-monohydrate, (MACROBID) 100 MG capsule Take 100 mg by mouth 2 (two) times daily.    ? ondansetron (ZOFRAN) 4 MG tablet Take 1 tablet (4 mg total) by mouth as needed. 30 tablet 1  ? PARoxetine (PAXIL) 40 MG tablet Take 40 mg by mouth every morning.    ? phenazopyridine (PYRIDIUM) 100 MG tablet SMARTSIG:1 Tablet(s) By Mouth 1-3 Times Daily    ? predniSONE  (DELTASONE) 5 MG tablet Take 4 tabs po x 4 days, 3  tabs po x 4 days, 2  tabs po x 4 days, 1  tab po x 4 days 40 tablet 0  ? promethazine (PHENERGAN) 12.5 MG tablet Take 1 tablet (12.5 mg total) by mouth every 6 (six) hours as needed for nausea or vomiting. 30 tablet 0  ? RINVOQ 15 MG TB24 TAKE 1 TABLET BY MOUTH 1 TIME A DAY. 90 tablet 0  ? SRONYX 0.1-20 MG-MCG tablet Take 1 tablet by mouth daily.    ? ?No current facility-administered medications on file prior to visit.  ? ? ?Allergies  ?Allergen Reactions  ? Keflex [Cephalexin] Hives  ? ? ?Objective: ?There were no vitals filed for this visit. ? ?General: No acute distress, AAOx3  ?Right foot: Incisions well-healed, no gapping or dehiscence at surgical sites, minimal swelling to right forefoot, no erythema, no warmth, no drainage, no signs of infection noted, minimal ecchymosis to the right forefoot, capillary fill time <3 seconds in all digits no pain with palpation to the lower leg or calf. ? ?Assessment and Plan:  ?Problem List Items Addressed This Visit   ?None ?Visit Diagnoses   ? ? S/P foot surgery, right    -  Primary  ? 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 Arthritis of foot      ?  Acquired deformity of right toe      ? Hammer toe of right foot      ? Neuritis      ? ?  ?  ?-Patient seen and evaluated ?-Fitted her custom orthotics to her current ascis tennis shoes; advised patient to slowly break in as directed ?-May continue with activities to tolerance  ?-Return to office in 2 weeks for final post op check/xrays or sooner if problems or issues arise.  ? ?Landis Martins, DPM ? ?

## 2021-11-05 ENCOUNTER — Telehealth: Payer: Self-pay | Admitting: *Deleted

## 2021-11-05 NOTE — Telephone Encounter (Signed)
Called and spoke with the patient and relayed the message per Dr Stover. Gustavo Meditz °

## 2021-11-05 NOTE — Telephone Encounter (Signed)
-----   Message from Landis Martins, [place] sent at 11/05/2021  7:59 AM EDT ----- ?Yes we can extend her time out of work. Have her to send FMLA paper work for me to update.  ?For her inserts continue to work on using them slowly but if pain worsens discontinue using them ?Thanks ?Dr. Chauncey Cruel ?----- Message ----- ?From: Viviana Simpler, PMAC ?Sent: 11/05/2021   7:22 AM EDT ?To: Landis Martins, DPM ? ?Patient called and stated that the inserts are hurting and that she was able to do 20 minutes and that she is working on them slowly and there is some swelling and wanted to know if she could be out for another 2 weeks. Lattie Haw ? ? ?

## 2021-11-06 DIAGNOSIS — M05841 Other rheumatoid arthritis with rheumatoid factor of right hand: Secondary | ICD-10-CM | POA: Diagnosis not present

## 2021-11-06 DIAGNOSIS — M05832 Other rheumatoid arthritis with rheumatoid factor of left wrist: Secondary | ICD-10-CM | POA: Diagnosis not present

## 2021-11-09 NOTE — Progress Notes (Signed)
? ?Office Visit Note ? ?Patient: Shannon ShinesHeather Duffell             ?Date of Birth: 03-15-1991           ?MRN: [id number]             ?PCP: Eunice Blase'Buch, Greta, PA-C ?Referring: Eunice Blase'Buch, Greta, PA-C ?Visit Date: 11/10/2021 ?Occupation: @GUAROCC @ ? ?Subjective:  ?Discussed medication options ? ?History of Present Illness: Shannon David is a 31 y.o. female with history of seropositive rheumatoid arthritis.  She is taking Rinvoq 15 mg 1 tablet by mouth daily, Methotrexate 8 tablets by mouth once weekly, and folic acid 2 mg daily.  Patient reports that she has been tolerating these medications without any side effects and has not missed any doses recently.  She continues to experience recurrent flares which are typically unprovoked.  She has been having difficulty working due to these recurrent and unexpected flares.  Her pain and inflammation is most severe in both hands and both feet.  She had right foot surgery in February and had a short gap in therapy during that time.  Her right foot surgery has been successful overall.  She states that she was evaluated by Dr. [facility]vyn [facility]asrtmann at [facility] to discuss possible surgery for her hands slow the progression of disease.  She was advised to follow back up with us to discuss more aggressive treatment options to manage her rheumatoid arthritis.  She states that for symptomatic relief she has been taking ibuprofen 800 mg once or twice daily.  Her last prednisone taper was sent to the pharmacy 1 month ago.  She would like to discuss other treatment options today. ? ? ? ? ?Activities of Daily Living:  ?Patient reports morning stiffness for 1-2 hours.   ?Patient Reports nocturnal pain.  ?Difficulty dressing/grooming: Reports ?Difficulty climbing stairs: Reports ?Difficulty getting out of chair: Reports ?Difficulty using hands for taps, buttons, cutlery, and/or writing: Reports ? ?Review of Systems  ?Constitutional:  Positive for fatigue.  ?HENT:  Negative for mouth sores, mouth dryness and  nose dryness.   ?Eyes:  Negative for pain, itching, visual disturbance and dryness.  ?Respiratory:  Negative for cough, hemoptysis, shortness of breath and difficulty breathing.   ?Cardiovascular:  Negative for chest pain, palpitations, hypertension and swelling in legs/feet.  ?Gastrointestinal:  Negative for blood in stool, constipation and diarrhea.  ?Endocrine: Negative for increased urination.  ?Genitourinary:  Negative for difficulty urinating and painful urination.  ?Musculoskeletal:  Positive for joint pain, joint pain, joint swelling and morning stiffness. Negative for myalgias, muscle weakness, muscle tenderness and myalgias.  ?Skin:  Negative for color change, pallor, rash, hair loss, nodules/bumps, redness, skin tightness, ulcers and sensitivity to sunlight.  ?Allergic/Immunologic: Positive for susceptible to infections.  ?Neurological:  Positive for weakness. Negative for dizziness, numbness, headaches and memory loss.  ?Hematological:  Positive for bruising/bleeding tendency. Negative for swollen glands.  ?Psychiatric/Behavioral:  Negative for depressed mood, confusion and sleep disturbance. The patient is not nervous/anxious.   ? ?PMFS History:  ?Patient Active Problem List  ? Diagnosis Date Noted  ? Pain in left knee 06/04/2021  ? History of PCOS 02/21/2017  ? High risk medication use 09/21/2016  ? Rheumatoid arthritis involving multiple sites with positive rheumatoid factor (HCC) 09/21/2016  ? History of hypothyroidism 09/21/2016  ? History of depression 09/21/2016  ?  ?Past Medical History:  ?Diagnosis Date  ? Arthritis   ? Rheumatoid arthritis (HCC)   ?  ?Family History  ?Problem Relation Age of Onset  ?  Hypertension Mother   ? Clotting disorder Mother   ? Cancer Maternal Grandmother   ?     lung  ? ?Past Surgical History:  ?Procedure Laterality Date  ? BUNIONECTOMY Right   ? FOOT SURGERY Right 08/24/2021  ? ?Social History  ? ?Social History Narrative  ? Left Handed   ? Lives in a one story home   ? Drinks Caffeine   ? ?Immunization History  ?Administered Date(s) Administered  ? PFIZER(Purple Top)SARS-COV-2 Vaccination 10/23/2021  ?  ? ?Objective: ?Vital Signs: BP 131/87 (BP Location: Left Arm, Patient Position: Sitting, Cuff Size: Large)   Pulse 68   Ht 5\' 2"  (1.575 m)   Wt 252 lb 3.2 oz (114.4 kg)   BMI 46.13 kg/m?   ? ?Physical Exam ?Vitals and nursing note reviewed.  ?Constitutional:   ?   Appearance: She is well-developed.  ?HENT:  ?   Head: Normocephalic and atraumatic.  ?Eyes:  ?   Conjunctiva/sclera: Conjunctivae normal.  ?Cardiovascular:  ?   Rate and Rhythm: Normal rate and regular rhythm.  ?   Heart sounds: Normal heart sounds.  ?Pulmonary:  ?   Effort: Pulmonary effort is normal.  ?   Breath sounds: Normal breath sounds.  ?Abdominal:  ?   General: Bowel sounds are normal.  ?   Palpations: Abdomen is soft.  ?Musculoskeletal:  ?   Cervical back: Normal range of motion.  ?Skin: ?   General: Skin is warm and dry.  ?   Capillary Refill: Capillary refill takes less than 2 seconds.  ?Neurological:  ?   Mental Status: She is alert and oriented to person, place, and time.  ?Psychiatric:     ?   Behavior: Behavior normal.  ?  ? ?Musculoskeletal Exam: C-spine has good range of motion with no discomfort.  Shoulder joints and elbow joints have good range of motion with no discomfort or tenderness.  Limited extension of both wrist joints with some synovial thickening but no tenderness or synovitis was noted.  Thickening of all MCP joints and ulnar deviation bilaterally right hand, greater than left.  Incomplete fist formation noted.  Some PIP thickening was also noted.  She has tenderness and synovitis of the right second and fourth MCPs and left third and fourth MCP joints.  Hip joints have good range of motion with no groin pain.  Knee joints have good range of motion with no warmth or effusion.  Ankle joints have good range of motion with no joint tenderness. ? ?CDAI Exam: ?CDAI Score: 9.2  ?Patient  Global: 6 mm; Provider Global: 6 mm ?Swollen: 4 ; Tender: 4  ?Joint Exam 11/10/2021  ? ?   Right  Left  ?MCP 2  Swollen Tender     ?MCP 3     Swollen Tender  ?MCP 4  Swollen Tender  Swollen Tender  ? ? ? ?Investigation: ?No additional findings. ? ?Imaging: ?No results found. ? ?Recent Labs: ?Lab Results  ?Component Value Date  ? WBC 4.7 09/14/2021  ? HGB 11.6 09/14/2021  ? PLT 406 09/14/2021  ? NA 137 09/14/2021  ? K 4.3 09/14/2021  ? CL 101 09/14/2021  ? CO2 22 09/14/2021  ? GLUCOSE 76 09/14/2021  ? BUN 16 09/14/2021  ? CREATININE 0.61 09/14/2021  ? BILITOT <0.2 09/14/2021  ? ALKPHOS 93 09/14/2021  ? AST 17 09/14/2021  ? ALT 16 09/14/2021  ? PROT 7.4 09/14/2021  ? ALBUMIN 4.0 09/14/2021  ? CALCIUM 9.3 09/14/2021  ? GFRAA 134  07/18/2020  ? QFTBGOLD Negative 08/13/2016  ? QFTBGOLDPLUS NEGATIVE 10/01/2021  ? ? ?Speciality Comments: PLQ Eye Exam: 01/16/2021 WNL @ [facility] Follow up in 6 months.  ? ?Prior therapy: Orencia (inadequate response 9/18-9/19), Enbrel (inadequate response 3/15/5/15), Humira (inadequate response 11/17-3/18), and injectable methotrexate (non-compliance) ? ?Procedures:  ?No procedures performed ?Allergies: Keflex [cephalexin]  ? ?Assessment / Plan:     ?Visit Diagnoses: Rheumatoid arthritis involving multiple sites with positive rheumatoid factor (HCC) - Severe, erosive rheumatoid arthritis involving multiple joints: Patient has severe longstanding rheumatoid arthritis with erosive changes.  She is currently taking Rinvoq 15 mg 1 tablet daily and methotrexate 8 tablets by mouth once weekly.  She continues to have recurrent flares despite remaining compliant with the current treatment regimen.  Recent prednisone tapers were sent to the pharmacy on 09/12/2021 and 10/12/2021.  She has been taking ibuprofen 800 mg once to twice daily for symptomatic relief when she is not taking prednisone.  Her pain and intermittent formation have been most severe in both hands and both feet.  She is concerned  about worsening deformity and progression of her disease.  She recently was evaluated by Dr. Melvyn Novas at [facility] to discuss possible surgical correction of the progressively worsening ulnar deviation

## 2021-11-10 ENCOUNTER — Encounter: Payer: Self-pay | Admitting: Physician Assistant

## 2021-11-10 ENCOUNTER — Ambulatory Visit: Payer: BC Managed Care – PPO | Admitting: Physician Assistant

## 2021-11-10 VITALS — BP 131/87 | HR 68 | Ht 62.0 in | Wt 252.2 lb

## 2021-11-10 DIAGNOSIS — M0579 Rheumatoid arthritis with rheumatoid factor of multiple sites without organ or systems involvement: Secondary | ICD-10-CM

## 2021-11-10 DIAGNOSIS — Z8659 Personal history of other mental and behavioral disorders: Secondary | ICD-10-CM

## 2021-11-10 DIAGNOSIS — M65341 Trigger finger, right ring finger: Secondary | ICD-10-CM | POA: Diagnosis not present

## 2021-11-10 DIAGNOSIS — Z8742 Personal history of other diseases of the female genital tract: Secondary | ICD-10-CM

## 2021-11-10 DIAGNOSIS — M21961 Unspecified acquired deformity of right lower leg: Secondary | ICD-10-CM

## 2021-11-10 DIAGNOSIS — M21942 Unspecified acquired deformity of hand, left hand: Secondary | ICD-10-CM

## 2021-11-10 DIAGNOSIS — M21941 Unspecified acquired deformity of hand, right hand: Secondary | ICD-10-CM

## 2021-11-10 DIAGNOSIS — M25512 Pain in left shoulder: Secondary | ICD-10-CM | POA: Diagnosis not present

## 2021-11-10 DIAGNOSIS — M21962 Unspecified acquired deformity of left lower leg: Secondary | ICD-10-CM

## 2021-11-10 DIAGNOSIS — Z13220 Encounter for screening for lipoid disorders: Secondary | ICD-10-CM

## 2021-11-10 DIAGNOSIS — M170 Bilateral primary osteoarthritis of knee: Secondary | ICD-10-CM

## 2021-11-10 DIAGNOSIS — M069 Rheumatoid arthritis, unspecified: Secondary | ICD-10-CM

## 2021-11-10 DIAGNOSIS — G8929 Other chronic pain: Secondary | ICD-10-CM

## 2021-11-10 DIAGNOSIS — Z8639 Personal history of other endocrine, nutritional and metabolic disease: Secondary | ICD-10-CM

## 2021-11-10 DIAGNOSIS — Z79899 Other long term (current) drug therapy: Secondary | ICD-10-CM

## 2021-11-10 MED ORDER — HYDROXYCHLOROQUINE SULFATE 200 MG PO TABS: ORAL_TABLET | ORAL | 0 refills | Status: DC

## 2021-11-18 ENCOUNTER — Encounter: Payer: Self-pay | Admitting: Sports Medicine

## 2021-11-18 ENCOUNTER — Ambulatory Visit (INDEPENDENT_AMBULATORY_CARE_PROVIDER_SITE_OTHER): Payer: BC Managed Care – PPO

## 2021-11-18 ENCOUNTER — Ambulatory Visit (INDEPENDENT_AMBULATORY_CARE_PROVIDER_SITE_OTHER): Payer: BC Managed Care – PPO | Admitting: Sports Medicine

## 2021-11-18 DIAGNOSIS — M2061 Acquired deformities of toe(s), unspecified, right foot: Secondary | ICD-10-CM | POA: Diagnosis not present

## 2021-11-18 DIAGNOSIS — M2041 Other hammer toe(s) (acquired), right foot: Secondary | ICD-10-CM

## 2021-11-18 DIAGNOSIS — M05772 Rheumatoid arthritis with rheumatoid factor of left ankle and foot without organ or systems involvement: Secondary | ICD-10-CM

## 2021-11-18 DIAGNOSIS — M19079 Primary osteoarthritis, unspecified ankle and foot: Secondary | ICD-10-CM

## 2021-11-18 DIAGNOSIS — M05771 Rheumatoid arthritis with rheumatoid factor of right ankle and foot without organ or systems involvement: Secondary | ICD-10-CM

## 2021-11-18 DIAGNOSIS — Z9889 Other specified postprocedural states: Secondary | ICD-10-CM

## 2021-11-18 NOTE — Progress Notes (Signed)
Subjective: Shannon David is a 31 y.o. female patient seen today in office for POV #6 (DOS 08-24-21), S/P Right first through fifth hammertoe repair and second through fifth met head resection. Patient reports that she is doing well and has a little swelling at end of the day but is grateful and states that her life is so much better after having foot surgery.   Patient Active Problem List   Diagnosis Date Noted   Pain in left knee 06/04/2021   History of PCOS 02/21/2017   High risk medication use 09/21/2016   Rheumatoid arthritis involving multiple sites with positive rheumatoid factor ([place]) 09/21/2016   History of hypothyroidism 09/21/2016   History of depression 09/21/2016    Current Outpatient Medications on File Prior to Visit  Medication Sig Dispense Refill   acetaminophen (TYLENOL) 500 MG tablet Take 1,000 mg by mouth every 6 (six) hours as needed for mild pain.     Cholecalciferol (VITAMIN D3) 50 MCG (2000 UT) capsule Take 2,000 Units by mouth daily.     fluticasone (FLONASE) 50 MCG/ACT nasal spray Place 2 sprays into both nostrils daily.     folic acid (FOLVITE) 1 MG tablet TAKE 2 TABLETS BY MOUTH EVERY DAY 180 tablet 3   hydroxychloroquine (PLAQUENIL) 200 MG tablet Take 1 tablet 200 mg BID Monday-Friday 120 tablet 0   ibuprofen (ADVIL) 800 MG tablet Take 1 tablet (800 mg total) by mouth every 8 (eight) hours as needed. For mild pain and inflammation while you are off your rheumatologic meds 30 tablet 0   levothyroxine (SYNTHROID) 88 MCG tablet Take 88 mcg by mouth every morning.     methotrexate (RHEUMATREX) 2.5 MG tablet TAKE 8 TABLETS (20 MG TOTAL) BY MOUTH ONCE A WEEK. CAUTION:CHEMOTHERAPY. PROTECT FROM LIGHT. 96 tablet 0   nitrofurantoin, macrocrystal-monohydrate, (MACROBID) 100 MG capsule Take 100 mg by mouth 2 (two) times daily.     ondansetron (ZOFRAN) 4 MG tablet Take 1 tablet (4 mg total) by mouth as needed. 30 tablet 1   PARoxetine (PAXIL) 40 MG tablet Take 40 mg by  mouth every morning.     phenazopyridine (PYRIDIUM) 100 MG tablet SMARTSIG:1 Tablet(s) By Mouth 1-3 Times Daily (Patient not taking: Reported on 11/10/2021)     predniSONE (DELTASONE) 5 MG tablet Take 4 tabs po x 4 days, 3  tabs po x 4 days, 2  tabs po x 4 days, 1  tab po x 4 days 40 tablet 0   promethazine (PHENERGAN) 12.5 MG tablet Take 1 tablet (12.5 mg total) by mouth every 6 (six) hours as needed for nausea or vomiting. 30 tablet 0   RINVOQ 15 MG TB24 TAKE 1 TABLET BY MOUTH 1 TIME A DAY. 90 tablet 0   SRONYX 0.1-20 MG-MCG tablet Take 1 tablet by mouth daily.     No current facility-administered medications on file prior to visit.    Allergies  Allergen Reactions   Keflex [Cephalexin] Hives    Objective: There were no vitals filed for this visit.  General: No acute distress, AAOx3  Right foot: Surgical sites are well healed, no gapping or dehiscence at surgical sites, no swelling to right forefoot, no ecchymosis, no erythema, no warmth, no drainage, no signs of infection noted, capillary fill time <3 seconds in all digits no pain with palpation to the lower leg or calf.  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 Discharged from post op care.   Landis Martins, DPM

## 2021-11-20 ENCOUNTER — Encounter: Payer: BC Managed Care – PPO | Admitting: Sports Medicine

## 2021-11-25 ENCOUNTER — Other Ambulatory Visit: Payer: Self-pay | Admitting: Physician Assistant

## 2021-11-26 NOTE — Telephone Encounter (Signed)
Next Visit: 01/06/2022  Last Visit: 11/10/2021  Last Fill: 02/16/2021  Dx: Rheumatoid arthritis involving multiple sites with positive rheumatoid factor   Current Dose per office note on 11/10/2021: not discussed  Okay to refill Zofran?

## 2021-11-30 ENCOUNTER — Other Ambulatory Visit: Payer: Self-pay | Admitting: *Deleted

## 2021-11-30 ENCOUNTER — Telehealth: Payer: Self-pay | Admitting: Rheumatology

## 2021-11-30 DIAGNOSIS — M0579 Rheumatoid arthritis with rheumatoid factor of multiple sites without organ or systems involvement: Secondary | ICD-10-CM

## 2021-11-30 DIAGNOSIS — Z13220 Encounter for screening for lipoid disorders: Secondary | ICD-10-CM

## 2021-11-30 DIAGNOSIS — Z79899 Other long term (current) drug therapy: Secondary | ICD-10-CM

## 2021-11-30 NOTE — Telephone Encounter (Signed)
Released.

## 2021-11-30 NOTE — Telephone Encounter (Signed)
Patient called the office requesting lab orders be sent to the [facility]. Patient states she is going in the morning.

## 2021-12-01 DIAGNOSIS — Z79899 Other long term (current) drug therapy: Secondary | ICD-10-CM | POA: Diagnosis not present

## 2021-12-01 DIAGNOSIS — Z13220 Encounter for screening for lipoid disorders: Secondary | ICD-10-CM | POA: Diagnosis not present

## 2021-12-01 DIAGNOSIS — M0579 Rheumatoid arthritis with rheumatoid factor of multiple sites without organ or systems involvement: Secondary | ICD-10-CM | POA: Diagnosis not present

## 2021-12-01 NOTE — Telephone Encounter (Signed)
Patient can try reducing the dose of plaquenil to once daily to see if that minimizes the side effects.  She should take plaquenil with food.  May also help to take in the evening before bed.  If she cannot tolerate plaquenil please advise the patient to schedule a follow up visit.

## 2021-12-02 LAB — CBC WITH DIFFERENTIAL/PLATELET
Basophils Absolute: 0 10*3/uL (ref 0.0–0.2)
Basos: 0 %
EOS (ABSOLUTE): 0 10*3/uL (ref 0.0–0.4)
Eos: 0 %
Hematocrit: 34.2 % (ref 34.0–46.6)
Hemoglobin: 11.5 g/dL (ref 11.1–15.9)
Immature Grans (Abs): 0 10*3/uL (ref 0.0–0.1)
Immature Granulocytes: 0 %
Lymphocytes Absolute: 0.8 10*3/uL (ref 0.7–3.1)
Lymphs: 19 %
MCH: 31 pg (ref 26.6–33.0)
MCHC: 33.6 g/dL (ref 31.5–35.7)
MCV: 92 fL (ref 79–97)
Monocytes Absolute: 0.5 10*3/uL (ref 0.1–0.9)
Monocytes: 10 %
Neutrophils Absolute: 3.1 10*3/uL (ref 1.4–7.0)
Neutrophils: 71 %
Platelets: 300 10*3/uL (ref 150–450)
RBC: 3.71 x10E6/uL — ABNORMAL LOW (ref 3.77–5.28)
RDW: 14.5 % (ref 11.7–15.4)
WBC: 4.5 10*3/uL (ref 3.4–10.8)

## 2021-12-02 LAB — CMP14+EGFR
ALT: 14 IU/L (ref 0–32)
AST: 16 IU/L (ref 0–40)
Albumin/Globulin Ratio: 1.8 (ref 1.2–2.2)
Albumin: 4.3 g/dL (ref 3.8–4.8)
Alkaline Phosphatase: 80 IU/L (ref 44–121)
BUN/Creatinine Ratio: 18 (ref 9–23)
BUN: 12 mg/dL (ref 6–20)
Bilirubin Total: 0.3 mg/dL (ref 0.0–1.2)
CO2: 22 mmol/L (ref 20–29)
Calcium: 8.6 mg/dL — ABNORMAL LOW (ref 8.7–10.2)
Chloride: 102 mmol/L (ref 96–106)
Creatinine, Ser: 0.65 mg/dL (ref 0.57–1.00)
Globulin, Total: 2.4 g/dL (ref 1.5–4.5)
Glucose: 97 mg/dL (ref 70–99)
Potassium: 4.3 mmol/L (ref 3.5–5.2)
Sodium: 136 mmol/L (ref 134–144)
Total Protein: 6.7 g/dL (ref 6.0–8.5)
eGFR: 120 mL/min/{1.73_m2} (ref >59)

## 2021-12-02 LAB — LIPID PANEL
Chol/HDL Ratio: 2.6 ratio (ref 0.0–4.4)
Cholesterol, Total: 148 mg/dL (ref 100–199)
HDL: 57 mg/dL (ref >39)
LDL Chol Calc (NIH): 75 mg/dL (ref 0–99)
Triglycerides: 81 mg/dL (ref 0–149)
VLDL Cholesterol Cal: 16 mg/dL (ref 5–40)

## 2021-12-02 NOTE — Progress Notes (Signed)
Calcium is low-8.6.  Rest of CMP WNL.  Lipid panel WNL.   RBC count is borderline low but stable. Rest of CBC WNL.

## 2021-12-10 ENCOUNTER — Telehealth: Payer: Self-pay | Admitting: Rheumatology

## 2021-12-10 ENCOUNTER — Other Ambulatory Visit: Payer: Self-pay | Admitting: Physician Assistant

## 2021-12-10 NOTE — Telephone Encounter (Signed)
Patient came into the office to have Dr. Corliss Skains fill out ADA paperwork. Patient was advised that she would need a FCE and that those are not done in our office. Patient advised to contact primary care to be referred for physical therapy to have the exam done. Patient needs clarification.

## 2021-12-10 NOTE — Telephone Encounter (Signed)
Next Visit: 01/06/2022   Last Visit: 11/10/2021   Last Fill: 09/14/2021  Dx: Rheumatoid arthritis involving multiple sites with positive rheumatoid factor    Current Dose per office note on 11/10/2021: Methotrexate 8 tablets by mouth once weekly  Labs: 12/01/2021 Calcium is low-8.6.  Rest of CMP WNL. Lipid panel WNL.  RBC count is borderline low but stable. Rest of CBC WNL.  Okay to refill MTX?

## 2021-12-11 NOTE — Telephone Encounter (Signed)
LMOM for patient

## 2021-12-14 DIAGNOSIS — M0680 Other specified rheumatoid arthritis, unspecified site: Secondary | ICD-10-CM | POA: Diagnosis not present

## 2021-12-14 DIAGNOSIS — Z6841 Body Mass Index (BMI) 40.0 and over, adult: Secondary | ICD-10-CM | POA: Diagnosis not present

## 2021-12-14 DIAGNOSIS — R635 Abnormal weight gain: Secondary | ICD-10-CM | POA: Diagnosis not present

## 2021-12-24 NOTE — Progress Notes (Deleted)
Office Visit Note  Patient: Shannon David             Date of Birth: 10/07/1990           MRN: [id number]             PCP: Eunice Blase, PA-C Referring: Eunice Blase, PA-C Visit Date: 01/06/2022 Occupation: @GUAROCC @  Subjective:  Medication monitoring-plaquenil added  History of Present Illness: Shannon David is a 31 y.o. female with history of seropositive rheumatoid arthritis.  She is taking plaquenil 200 mg 1 tablet by mouth twice daily Monday through Friday, Rinvoq 15 mg 1 tablet by mouth daily, Methotrexate 8 tablets by mouth once weekly, and folic acid 2 mg daily.  Plaquenil was added after her last office visit on 11/10/21.   PLQ Eye Exam: 01/16/2021 WNL @ [facility] Follow up in 6 months.  CBC and CMP updated on 12/01/21. TB gold negative on 10/01/21.  Lipid panel WNL on 12/01/21.  Counseled on the increase risk of venous thrombosis. Counseled about FDA black box warning of MACE (major adverse CV events including cardiovascular death, myocardial infarction, and stroke).     Activities of Daily Living:  Patient reports morning stiffness for *** {minute/hour:19697}.   Patient {ACTIONS;DENIES/REPORTS:21021675::"Denies"} nocturnal pain.  Difficulty dressing/grooming: {ACTIONS;DENIES/REPORTS:21021675::"Denies"} Difficulty climbing stairs: {ACTIONS;DENIES/REPORTS:21021675::"Denies"} Difficulty getting out of chair: {ACTIONS;DENIES/REPORTS:21021675::"Denies"} Difficulty using hands for taps, buttons, cutlery, and/or writing: {ACTIONS;DENIES/REPORTS:21021675::"Denies"}  No Rheumatology ROS completed.   PMFS History:  Patient Active Problem List   Diagnosis Date Noted   Pain in left knee 06/04/2021   History of PCOS 02/21/2017   High risk medication use 09/21/2016   Rheumatoid arthritis involving multiple sites with positive rheumatoid factor (HCC) 09/21/2016   History of hypothyroidism 09/21/2016   History of depression 09/21/2016    Past Medical History:  Diagnosis  Date   Arthritis    Rheumatoid arthritis (HCC)     Family History  Problem Relation Age of Onset   Hypertension Mother    Clotting disorder Mother    Cancer Maternal Grandmother        lung   Past Surgical History:  Procedure Laterality Date   BUNIONECTOMY Right    FOOT SURGERY Right 08/24/2021   Social History   Social History Narrative   Left Handed    Lives in a one story home   Drinks Caffeine    Immunization History  Administered Date(s) Administered   PFIZER(Purple Top)SARS-COV-2 Vaccination 10/23/2021     Objective: Vital Signs: There were no vitals taken for this visit.   Physical Exam Vitals and nursing note reviewed.  Constitutional:      Appearance: She is well-developed.  HENT:     Head: Normocephalic and atraumatic.  Eyes:     Conjunctiva/sclera: Conjunctivae normal.  Cardiovascular:     Rate and Rhythm: Normal rate and regular rhythm.     Heart sounds: Normal heart sounds.  Pulmonary:     Effort: Pulmonary effort is normal.     Breath sounds: Normal breath sounds.  Abdominal:     General: Bowel sounds are normal.     Palpations: Abdomen is soft.  Musculoskeletal:     Cervical back: Normal range of motion.  Skin:    General: Skin is warm and dry.     Capillary Refill: Capillary refill takes less than 2 seconds.  Neurological:     Mental Status: She is alert and oriented to person, place, and time.  Psychiatric:  Behavior: Behavior normal.      Musculoskeletal Exam: ***  CDAI Exam: CDAI Score: -- Patient Global: --; Provider Global: -- Swollen: --; Tender: -- Joint Exam 01/06/2022   No joint exam has been documented for this visit   There is currently no information documented on the homunculus. Go to the Rheumatology activity and complete the homunculus joint exam.  Investigation: No additional findings.  Imaging: No results found.  Recent Labs: Lab Results  Component Value Date   WBC 4.5 12/01/2021   HGB 11.5  12/01/2021   PLT 300 12/01/2021   NA 136 12/01/2021   K 4.3 12/01/2021   CL 102 12/01/2021   CO2 22 12/01/2021   GLUCOSE 97 12/01/2021   BUN 12 12/01/2021   CREATININE 0.65 12/01/2021   BILITOT 0.3 12/01/2021   ALKPHOS 80 12/01/2021   AST 16 12/01/2021   ALT 14 12/01/2021   PROT 6.7 12/01/2021   ALBUMIN 4.3 12/01/2021   CALCIUM 8.6 (L) 12/01/2021   GFRAA 134 07/18/2020   QFTBGOLD Negative 08/13/2016   QFTBGOLDPLUS NEGATIVE 10/01/2021    Speciality Comments: PLQ Eye Exam: 01/16/2021 WNL @ [facility] Follow up in 6 months.   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  No orders of the defined types were placed in this encounter.   Face-to-face time spent with patient was *** minutes. Greater than 50% of time was spent in counseling and coordination of care.  Follow-Up Instructions: No follow-ups on file.   Gearldine Bienenstock, PA-C  Note - This record has been created using Dragon software.  Chart creation errors have been sought, but may not always  have been located. Such creation errors do not reflect on  the standard of medical care.

## 2021-12-25 ENCOUNTER — Telehealth: Payer: Self-pay | Admitting: *Deleted

## 2021-12-25 NOTE — Telephone Encounter (Signed)
Medication Samples have been provided to the patient.  Drug name: Rinvoq       Strength: 15 mg        Qty: 1  LOT: [id number]   Exp.Date: 04/10/2023  Dosing instructions: Take one tablet by mouth daily.   

## 2022-01-04 ENCOUNTER — Telehealth: Payer: Self-pay | Admitting: Pharmacist

## 2022-01-04 ENCOUNTER — Ambulatory Visit: Payer: BC Managed Care – PPO | Admitting: Physician Assistant

## 2022-01-04 NOTE — Telephone Encounter (Signed)
Submitted a Prior Authorization RENEWAL request to [facility] for RINVOQ via fax on CoverMyMeds portal. Will update once we receive a response.  Key: [id number]  Chesley Mires, PharmD, MPH, BCPS, CPP Clinical Pharmacist (Rheumatology and Pulmonology)

## 2022-01-06 ENCOUNTER — Ambulatory Visit: Payer: BC Managed Care – PPO | Admitting: Physician Assistant

## 2022-01-06 DIAGNOSIS — Z79899 Other long term (current) drug therapy: Secondary | ICD-10-CM

## 2022-01-06 DIAGNOSIS — Z8659 Personal history of other mental and behavioral disorders: Secondary | ICD-10-CM

## 2022-01-06 DIAGNOSIS — M170 Bilateral primary osteoarthritis of knee: Secondary | ICD-10-CM

## 2022-01-06 DIAGNOSIS — M0579 Rheumatoid arthritis with rheumatoid factor of multiple sites without organ or systems involvement: Secondary | ICD-10-CM

## 2022-01-06 DIAGNOSIS — M21961 Unspecified acquired deformity of right lower leg: Secondary | ICD-10-CM

## 2022-01-06 DIAGNOSIS — Z8639 Personal history of other endocrine, nutritional and metabolic disease: Secondary | ICD-10-CM

## 2022-01-06 DIAGNOSIS — M65341 Trigger finger, right ring finger: Secondary | ICD-10-CM

## 2022-01-06 DIAGNOSIS — Z8742 Personal history of other diseases of the female genital tract: Secondary | ICD-10-CM

## 2022-01-06 DIAGNOSIS — G8929 Other chronic pain: Secondary | ICD-10-CM

## 2022-01-06 NOTE — Telephone Encounter (Addendum)
Received notification from [facility] regarding a prior authorization for [facility]. Authorization has been APPROVED from 01/05/22 to 01/06/23.   Patient must continue to fill through [facility]: [phone]  Authorization # [id number]  Chesley Mires, PharmD, MPH, BCPS, CPP Clinical Pharmacist (Rheumatology and Pulmonology)

## 2022-01-08 NOTE — Progress Notes (Unsigned)
Office Visit Note  Patient: Shannon David             Date of Birth: Aug 01, 1990           MRN: [id number]             PCP: Eunice Blase, PA-C Referring: Eunice Blase, PA-C Visit Date: 01/20/2022 Occupation: @GUAROCC @  Subjective:  Medication monitoring   History of Present Illness: Shannon David is a 31 y.o. female with history of seropositive rheumatoid arthritis and osteoarthritis.  She is taking plaquenil 200 mg 1 tablet by mouth twice daily Monday through Friday, Rinvoq 15 mg 1 tablet by mouth daily, and Methotrexate 8 tablets by mouth once weekly.  Plaquenil was added after her last office visit on 11/10/2021.  She has been tolerating Plaquenil without any side effects.  She remains on Rinvoq and methotrexate as prescribed.  Patient reports that she has noticed a 25 to 50% improvement in her joint pain and stiffness since restarting Plaquenil.  She states she continues to have recurrent flares.  She states that she missed 4 days of work last month due to recurrent flares.  She states that she is concerned she may lose her job due to the frequency of flares.  Her flares typically affect her wrists, hands, and both feet.  She has some discomfort in both shoulders at night when lying on her sides.  She states that overall today is a good day. She denies any recent or recurrent infections.  She denies any new medical conditions.      Activities of Daily Living:  Patient reports morning stiffness for 30 minutes.   Patient Reports nocturnal pain.  Difficulty dressing/grooming: Reports Difficulty climbing stairs: Reports Difficulty getting out of chair: Reports Difficulty using hands for taps, buttons, cutlery, and/or writing: Reports  Review of Systems  Constitutional:  Positive for fatigue.  HENT:  Negative for mouth sores and mouth dryness.   Eyes:  Negative for dryness.  Respiratory:  Negative for shortness of breath.   Cardiovascular:  Negative for chest pain and  palpitations.  Gastrointestinal:  Negative for blood in stool, constipation and diarrhea.  Endocrine: Negative for increased urination.  Genitourinary:  Negative for involuntary urination.  Musculoskeletal:  Positive for joint pain, joint pain, joint swelling and morning stiffness. Negative for myalgias, muscle weakness, muscle tenderness and myalgias.  Skin:  Positive for sensitivity to sunlight. Negative for color change, rash and hair loss.  Allergic/Immunologic: Positive for susceptible to infections.  Neurological:  Negative for dizziness and headaches.  Hematological:  Negative for swollen glands.  Psychiatric/Behavioral:  Positive for depressed mood and sleep disturbance. The patient is nervous/anxious.     PMFS History:  Patient Active Problem List   Diagnosis Date Noted   Pain in left knee 06/04/2021   History of PCOS 02/21/2017   High risk medication use 09/21/2016   Rheumatoid arthritis involving multiple sites with positive rheumatoid factor (HCC) 09/21/2016   History of hypothyroidism 09/21/2016   History of depression 09/21/2016    Past Medical History:  Diagnosis Date   Arthritis    Rheumatoid arthritis (HCC)     Family History  Problem Relation Age of Onset   Hypertension Mother    Clotting disorder Mother    Cancer Maternal Grandmother        lung   Past Surgical History:  Procedure Laterality Date   BUNIONECTOMY Right    FOOT SURGERY Right 08/24/2021   Social History   Social History Narrative  Left Handed    Lives in a one story home   Drinks Caffeine    Immunization History  Administered Date(s) Administered   PFIZER(Purple Top)SARS-COV-2 Vaccination 10/23/2021     Objective: Vital Signs: BP 117/81 (BP Location: Left Arm, Patient Position: Sitting, Cuff Size: Large)   Pulse 76   Ht 5\' 2"  (1.575 m)   Wt 245 lb 12.8 oz (111.5 kg)   BMI 44.96 kg/m    Physical Exam Vitals and nursing note reviewed.  Constitutional:      Appearance: She is  well-developed.  HENT:     Head: Normocephalic and atraumatic.  Eyes:     Conjunctiva/sclera: Conjunctivae normal.  Cardiovascular:     Rate and Rhythm: Normal rate and regular rhythm.     Heart sounds: Normal heart sounds.  Pulmonary:     Effort: Pulmonary effort is normal.     Breath sounds: Normal breath sounds.  Abdominal:     General: Bowel sounds are normal.     Palpations: Abdomen is soft.  Musculoskeletal:     Cervical back: Normal range of motion.  Skin:    General: Skin is warm and dry.     Capillary Refill: Capillary refill takes less than 2 seconds.  Neurological:     Mental Status: She is alert and oriented to person, place, and time.  Psychiatric:        Behavior: Behavior normal.      Musculoskeletal Exam: C-spine good ROM.  Shoulder joints have good ROM with no discomfort or tenderness.  Elbow joints have good range of motion with no tenderness or inflammation.  Limited extension of both wrist joints with synovial thickening.  Thickening and ulnar deviation of all MCP joints.  Tenderness over bilateral second and third MCP joints.  Incomplete fist formation noted.  PIP thickening noted.  Hip joints have good range of motion with no groin pain.  Knee joints have good range of motion with no warmth or effusion.  Tenderness and warmth of the right ankle noted.  No tenderness over MTP joints currently.  CDAI Exam: CDAI Score: 6  Patient Global: 5 mm; Provider Global: 5 mm Swollen: 1 ; Tender: 6  Joint Exam 01/20/2022      Right  Left  MCP 2   Tender   Tender  MCP 3   Tender   Tender  MCP 4   Tender     Ankle  Swollen Tender        Investigation: No additional findings.  Imaging: No results found.  Recent Labs: Lab Results  Component Value Date   WBC 4.5 12/01/2021   HGB 11.5 12/01/2021   PLT 300 12/01/2021   NA 136 12/01/2021   K 4.3 12/01/2021   CL 102 12/01/2021   CO2 22 12/01/2021   GLUCOSE 97 12/01/2021   BUN 12 12/01/2021   CREATININE 0.65  12/01/2021   BILITOT 0.3 12/01/2021   ALKPHOS 80 12/01/2021   AST 16 12/01/2021   ALT 14 12/01/2021   PROT 6.7 12/01/2021   ALBUMIN 4.3 12/01/2021   CALCIUM 8.6 (L) 12/01/2021   GFRAA 134 07/18/2020   QFTBGOLD Negative 08/13/2016   QFTBGOLDPLUS NEGATIVE 10/01/2021    Speciality Comments: PLQ Eye Exam: 01/16/2021 WNL @ [facility] Follow up in 6 months.   Prior therapy: Orencia (inadequate response 9/18-9/19), Enbrel (inadequate response 3/15/5/15), Humira (inadequate response 11/17-3/18), and injectable methotrexate (non-compliance)  Procedures:  No procedures performed Allergies: Keflex [cephalexin]   Assessment / Plan:  Visit Diagnoses: Rheumatoid arthritis involving multiple sites with positive rheumatoid factor (HCC) - Severe, erosive rheumatoid arthritis involving multiple joints: Patient presents today continuing to experience recurrent flares involving multiple joints.  Last month she missed 4 days of work due to the frequency and severity of flares.  Her symptoms are typically most severe in both hands and both feet.  She experiences nocturnal pain in both shoulders when lying on her sides at night.  She is currently taking Plaquenil 200 mg 1 tablet by mouth twice daily Monday through Friday, methotrexate 8 tablets by mouth once weekly, and Rinvoq 15 mg 1 tablet daily.  She is tolerating combination therapy without any side effects and has not missed any doses recently.  Plaquenil was restarted at her last office visit on 11/10/2021.  She has noticed about a 25 to 50% improvement in her joint pain and stiffness since adding Plaquenil as triple therapy. She remains concerned about the frequency of her flares as well as deformity in her hands and feet.  She is concerned she may be losing her job due to running out of FMLA days and having to miss work for flares.  Her rheumatoid arthritis remains inadequately controlled on triple therapy.  Different treatment options were discussed  today in detail.  Discussed switching from oral to injectable methotrexate but she would like to remain on oral methotrexate at this time.  I also discussed switching from Rinvoq to Olumiant and she was in agreement.  Indications, contraindications, and potential side effects Olumiant were discussed today in detail.  All questions were addressed and consent was obtained.  We will apply for olumiant through her insurance and once approved a prescription will be sent to the pharmacy.  She will remain on Plaquenil and methotrexate for triple therapy.  I will also send a prescription for prednisone 5 mg 1 tablet daily for 1 month and then she will reduce to prednisone 2.5 mg daily for 1 month.  Instructions were provided including taking prednisone in the morning with food and to avoid the use of NSAIDs.  She will follow-up in the office in 6 to 8 weeks to assess her response.  Counseled patient that Olumiant is a JAK inhibitor indicated for Rheumatoid Arthritis.  Counseled patient on purpose, proper use, and adverse effects of Olumiant.    Reviewed the most common adverse effects including infection, diarrhea, headaches.  Also reviewed rare adverse effects such as bowel injury and the need to contact us if they develop stomach pain during treatment. Counseled on the increase risk of venous thrombosis. Counseled about FDA black box warning of MACE (major adverse CV events including cardiovascular death, myocardial infarction, and stroke).  Reviewed with patient that there is the possibility of an increased risk of malignancy specifically lung cancer and lymphomas but it is not well understood if this increased risk is due to the medication or the disease state. Instructed patient that medication should be held for infection and prior to surgery.  Advised patient to avoid live vaccines. Recommend annual influenza, PCV 15 or PCV20 or Pneumovax 23, and Shingrix as indicated.    Reviewed importance of routine lab  monitoring including lipid panel.  Will recheck lipid panel 3 months after starting and annually thereafter. CBC and CMP will be monitored routinely every 3 months.  Standing orders placed. Provided patient with medication education material and answered all questions.  Patient consented to Olumiant.  Will upload into patient's chart.    Patient dose will be Olumiant  2 mg daily.  Prescription will be sent to pharmacy pending lab results and/or insurance approval.  High risk medication use - Plaquenil 200 mg 1 tablet by mouth twice daily Monday through Friday (restarted May 2023), Methotrexate 8 tablets by mouth once weekly.  Applying for Olumiant 2 mg daily.   Inadequate response to Enbrel, Humira, Orencia, and rinvoq.  CBC and CMP updated on 12/01/21.  She will return for updated lab work in 1 month then every 3 months.  TB gold negative on 10/01/21. Lipid panel WNL on 12/01/21.  No recent or recurrent infections. Discussed the importance of holding oluminant and methotrexate if she develops signs or symptoms of an infection and to resume once the infection has completely cleared.  Counseled on the increase risk of venous thrombosis. Counseled about FDA black box warning of MACE (major adverse CV events including cardiovascular death, myocardial infarction, and stroke).   Trigger finger, right ring finger - Resolved.   Chronic left shoulder pain: She continues to experience intermittent discomfort in both shoulder joints especially at night when lying on her sides.  On examination today she has good range of motion bilaterally with no tenderness upon palpation.  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  She continues to experience intermittent flares in both hands.  Last month she missed 4 days of work due to the frequency and severity of these flares.  On examination today she has tenderness over bilateral second and third MCP  joints.  Primary osteoarthritis of both knees: She has good range of motion of both knee joints on examination today.  No warmth or effusion was noted.  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 Marylene Land.  She underwent right foot reconstructive surgery on 08/24/2021 which was successful.  She continues to experience intermittent pain in both feet.  On examination she has tenderness and warmth of the right ankle joint.  No tenderness over MTP joints currently.   Other medical conditions are listed as follows:   History of hypothyroidism  History of depression  History of PCOS  Orders: No orders of the defined types were placed in this encounter.  No orders of the defined types were placed in this encounter.    Follow-Up Instructions: Return for Rheumatoid arthritis, Osteoarthritis.   Gearldine Bienenstock, PA-C  Note - This record has been created using Dragon software.  Chart creation errors have been sought, but may not always  have been located. Such creation errors do not reflect on  the standard of medical care.

## 2022-01-09 ENCOUNTER — Other Ambulatory Visit: Payer: Self-pay | Admitting: Physician Assistant

## 2022-01-11 NOTE — Telephone Encounter (Signed)
Next Visit: 01/20/2022  Last Visit: 11/10/2021  Labs: 12/01/2021 Calcium is low-8.6.  Rest of CMP WNL.  Lipid panel WNL.   RBC count is borderline low but stable. Rest of CBC WNL.    Eye exam: 01/16/2021 WNL    Current Dose per office note 11/10/2021: plaquenil 200 mg 1 tablet by mouth twice daily Monday through Friday  WP:[id number] arthritis involving multiple sites with positive rheumatoid factor   Last Fill: 11/10/2021  Okay to refill Plaquenil?

## 2022-01-12 DIAGNOSIS — R799 Abnormal finding of blood chemistry, unspecified: Secondary | ICD-10-CM | POA: Diagnosis not present

## 2022-01-12 DIAGNOSIS — E559 Vitamin D deficiency, unspecified: Secondary | ICD-10-CM | POA: Diagnosis not present

## 2022-01-20 ENCOUNTER — Other Ambulatory Visit: Payer: Self-pay

## 2022-01-20 ENCOUNTER — Telehealth: Payer: Self-pay | Admitting: Pharmacist

## 2022-01-20 ENCOUNTER — Other Ambulatory Visit (HOSPITAL_COMMUNITY): Payer: Self-pay

## 2022-01-20 ENCOUNTER — Encounter: Payer: Self-pay | Admitting: Physician Assistant

## 2022-01-20 ENCOUNTER — Ambulatory Visit: Payer: BC Managed Care – PPO | Admitting: Physician Assistant

## 2022-01-20 VITALS — BP 117/81 | HR 76 | Ht 62.0 in | Wt 245.8 lb

## 2022-01-20 DIAGNOSIS — Z8639 Personal history of other endocrine, nutritional and metabolic disease: Secondary | ICD-10-CM

## 2022-01-20 DIAGNOSIS — M21962 Unspecified acquired deformity of left lower leg: Secondary | ICD-10-CM

## 2022-01-20 DIAGNOSIS — M0579 Rheumatoid arthritis with rheumatoid factor of multiple sites without organ or systems involvement: Secondary | ICD-10-CM

## 2022-01-20 DIAGNOSIS — M21961 Unspecified acquired deformity of right lower leg: Secondary | ICD-10-CM

## 2022-01-20 DIAGNOSIS — M069 Rheumatoid arthritis, unspecified: Secondary | ICD-10-CM

## 2022-01-20 DIAGNOSIS — M65341 Trigger finger, right ring finger: Secondary | ICD-10-CM

## 2022-01-20 DIAGNOSIS — M25512 Pain in left shoulder: Secondary | ICD-10-CM | POA: Diagnosis not present

## 2022-01-20 DIAGNOSIS — Z79899 Other long term (current) drug therapy: Secondary | ICD-10-CM

## 2022-01-20 DIAGNOSIS — M170 Bilateral primary osteoarthritis of knee: Secondary | ICD-10-CM

## 2022-01-20 DIAGNOSIS — Z8742 Personal history of other diseases of the female genital tract: Secondary | ICD-10-CM

## 2022-01-20 DIAGNOSIS — Z8659 Personal history of other mental and behavioral disorders: Secondary | ICD-10-CM

## 2022-01-20 DIAGNOSIS — M21942 Unspecified acquired deformity of hand, left hand: Secondary | ICD-10-CM

## 2022-01-20 DIAGNOSIS — M21941 Unspecified acquired deformity of hand, right hand: Secondary | ICD-10-CM

## 2022-01-20 DIAGNOSIS — G8929 Other chronic pain: Secondary | ICD-10-CM

## 2022-01-20 MED ORDER — PREDNISONE 5 MG PO TABS: ORAL_TABLET | ORAL | 0 refills | Status: DC

## 2022-01-20 NOTE — Telephone Encounter (Signed)
Please start Olumiant BIV.  Dose: 2mg  daily  Dx: Rheumatoid arthritis (M05.9)  Previously tried therapies: Rinvoq - inadequate clinical response Orencia (inadequate response 9/18-9/19) Enbrel (inadequate response 3/15-5/15) Humira (inadequate response 11/17-3/18) injectable methotrexate (non-compliance  If patient is denied Olumiant because 01-11-1997 is preferred, please move forward with Harriette Ohara authorization. Dose is Xeljanz 11mg  ER daily. Patient signed consent for both medications.  Once approved, patient is eligible for copay card  Harriette Ohara, PharmD, MPH, BCPS, CPP Clinical Pharmacist (Rheumatology and Pulmonology)

## 2022-01-20 NOTE — Telephone Encounter (Signed)
Submitted an URGENT Prior Authorization request to [facility] for OLUMIANT via CoverMyMeds. Pending clinical questions to populate.  Key: [id number]  Chesley Mires, PharmD, MPH, BCPS, CPP Clinical Pharmacist (Rheumatology and Pulmonology)

## 2022-01-20 NOTE — Patient Instructions (Addendum)
Standing Labs We placed an order today for your standing lab work.   Please have your standing labs drawn in 1 month and then every 3 months   If possible, please have your labs drawn 2 weeks prior to your appointment so that the provider can discuss your results at your appointment.  Please note that you may see your imaging and lab results in [place] before we have reviewed them. We may be awaiting multiple results to interpret others before contacting you. Please allow our office up to 72 hours to thoroughly review all of the results before contacting the office for clarification of your results.  We have open lab daily: Monday through Thursday from 1:30-4:30 PM and Friday from 1:30-4:00 PM at the office of Dr. Bo Merino, [facility].   Please be advised, all patients with office appointments requiring lab work will take precedent over walk-in lab work.  If possible, please come for your lab work on Monday and Friday afternoons, as you may experience shorter wait times. The office is located at [address], [place], [place], [place] [address] No appointment is necessary.   Labs are drawn by [facility]. Please bring your co-pay at the time of your lab draw.  You may receive a bill from [facility] for your lab work.  Please note if you are on Hydroxychloroquine and and an order has been placed for a Hydroxychloroquine level, you will need to have it drawn 4 hours or more after your last dose.  If you wish to have your labs drawn at another location, please call the office 24 hours in advance to send orders.  If you have any questions regarding directions or hours of operation,  please call [phone].   As a reminder, please drink plenty of water prior to coming for your lab work. Thanks!   Baricitinib Oral Tablets What is this medication? BARICITINIB (BAR i SYE it nib) treats rheumatoid arthritis. It may be used to treat severe hair loss. It can also be used to treat  COVID-19. This medication works on the immune system. This medicine may be used for other purposes; ask your health care provider or pharmacist if you have questions. COMMON BRAND NAME(S): OLUMIANT What should I tell my care team before I take this medication? They need to know if you have any of these conditions: blood clots cancer diabetes (high blood sugar) heart disease high blood pressure high cholesterol immune system problems infection especially a virus infection such as chickenpox, cold sores, hepatitis B or herpes infection such as tuberculosis (TB) or other bacterial, fungal or viral infections kidney disease liver disease lung or breathing disease (asthma, COPD) low blood counts (white cells, platelets, or red blood cells) organ transplant smoke tobacco cigarettes stomach or intestine problems stroke an unusual or allergic reaction to baricitinib, other medicines, foods, dyes, or preservatives pregnant or trying to get pregnant breast-feeding How should I use this medication? Take this medicine by mouth with water. Take it as directed on the prescription label at the same time every day. You can take it with or without food. If it upsets your stomach, take it with food. Keep taking it unless your health care provider tells you to stop. A special MedGuide will be given to you by the pharmacist with each prescription and refill. Be sure to read this information carefully each time. Talk to your health care provider about the use of this medicine in children. Special care may be needed. Overdosage: If you  think you have taken too much of this medicine contact a poison control center or emergency room at once. NOTE: This medicine is only for you. Do not share this medicine with others. What if I miss a dose? If you miss a dose, take it as soon as you can. If it is almost time for your next dose, take only that dose. Do not take double or extra doses. What may interact with  this medication? azathioprine biologic medicines such as abatacept, adalimumab, anakinra, certolizumab, etanercept, golimumab, infliximab, ofatumumab, rituximab, sarilumab, secukinumab, tocilizumab, ustekinumab cyclosporine live vaccines medicines that lower your chance of fighting infection probenecid This list may not describe all possible interactions. Give your health care provider a list of all the medicines, herbs, non-prescription drugs, or dietary supplements you use. Also tell them if you smoke, drink alcohol, or use illegal drugs. Some items may interact with your medicine. What should I watch for while using this medication? Visit your care team for regular checks on your progress. Tell your care team if your symptoms do not start to get better or if they get worse. You may need blood work while taking this medication. Avoid taking medications that contain aspirin, acetaminophen, ibuprofen, naproxen, or ketoprofen unless instructed by your care team. These medications may hide a fever. This medication may increase your risk of getting an infection. Call your care team for advice if you get a fever, chills, sore throat, or other symptoms of a cold or flu. Do not treat yourself. Try to avoid being around people who are sick. Talk to your care team if you wish to become pregnant or think you might be pregnant. This medication can cause serious birth defects. Do not breast-feed an infant while taking this medication or 4 days after stopping it. Talk to your care team about your risk of cancer. You may be more at risk for certain types of cancer if you take this medication. What side effects may I notice from receiving this medication? Side effects that you should report to your doctor or health care professional as soon as possible: allergic reactions (skin rash, itching or hives; swelling of the face, lips, or tongue) blood clot (chest pain; shortness of breath; pain, swelling, or warmth  in the leg) heart attack (trouble breathing; pain or tightness in the chest, neck, back or arms; unusually weak or tired) infection (fever, chills, cough, sore throat, pain or trouble passing urine) light-colored stool liver injury (dark yellow or brown urine; general ill feeling or flu-like symptoms; loss of appetite, right upper belly pain; unusually weak or tired; yellowing of the eyes or skin) low red blood cell counts (trouble breathing; feeling faint; lightheaded, falls; unusually weak or tired) stroke (changes in vision; confusion; trouble speaking or understanding; severe headaches; sudden numbness or weakness of the face, arm or leg; trouble walking; dizziness; loss of balance or coordination) tears in the stomach or intestines (fever; stomach pain; sudden change in bowel habits) Side effects that usually do not require medical attention (report these to your doctor or health care professional if they continue or are bothersome): nasal congestion (runny or stuffy nose) nausea This list may not describe all possible side effects. Call your doctor for medical advice about side effects. You may report side effects to FDA at 1-800-FDA-1088. Where should I keep my medication? Keep out of the reach of children and pets. Store at room temperature between 20 and 25 degrees C (68 and 77 degrees F). Get rid of any  unused medicine after the expiration date. To get rid of medicines that are no longer needed or have expired: Take the medicine to a medicine take-back program. Check with your pharmacy or law enforcement to find a location. If you cannot return the medicine, check the label or package insert to see if the medicine should be thrown out in the garbage or flushed down the toilet. If you are not sure, ask your health care provider. If it is safe to put it in the trash, empty the medicine out of the container. Mix the medicine with cat litter, dirt, coffee grounds, or other unwanted substance.  Seal the mixture in a bag or container. Put it in the trash. NOTE: This sheet is a summary. It may not cover all possible information. If you have questions about this medicine, talk to your doctor, pharmacist, or health care provider.  2023 Elsevier/Gold Standard (2021-04-09 00:00:00)  Tofacitinib Oral Tablets What is this medication? TOFACITINIB (TOE fa SYE ti nib) treats rheumatoid arthritis, psoriatic arthritis, and polyarticular juvenile idiopathic arthritis. It is also used to treat ankylosing spondylitis and ulcerative colitis. It works on the immune system. It belongs to a group of medicines called JAK inhibitors. This medicine may be used for other purposes; ask your health care provider or pharmacist if you have questions. COMMON BRAND NAME(S): [name] What should I tell my care team before I take this medication? They need to know if you have any of these conditions: blood clots cancer diabetes (high blood sugar) heart disease high blood pressure high cholesterol HIV or AIDS immune system problems infection especially a virus infection such as chickenpox, cold sores, hepatitis B or herpes infection such as tuberculosis (TB) or other bacterial, fungal or viral infections kidney disease liver disease low blood counts (white cells, platelets, or red blood cells) lung or breathing disease (asthma, COPD) organ transplant smoke tobacco cigarettes stomach or intestine problems an unusual or allergic reaction to tofacitinib, other medicines, foods, dyes, or preservatives pregnant or trying to get pregnant breast-feeding How should I use this medication? Take this medicine by mouth with water. Take it as directed on the prescription label. You can take it with or without food. If it upsets your stomach, take it with food. Keep taking it unless your health care provider tells you to stop. A special MedGuide will be given to you by the pharmacist with each prescription and refill.  Be sure to read this information carefully each time. Talk to your health care provider about the use of this medicine in children. While this drug may be prescribed for children as young as 2 years for selected conditions, precautions do apply. Overdosage: If you think you have taken too much of this medicine contact a poison control center or emergency room at once. NOTE: This medicine is only for you. Do not share this medicine with others. What if I miss a dose? If you miss a dose, take it as soon as you can. If it is almost time for your next dose, take only that dose. Do not take double or extra doses. What may interact with this medication? Do not take this medicine with any of the following medications: upadacitinib This medicine may also interact with the following medications: antiviral medicines for hepatitis, HIV or AIDS azathioprine biologic medicines such as abatacept, adalimumab, anakinra, certolizumab, etanercept, golimumab, infliximab, ofatumumab, rituximab, sarilumab, secukinumab, tocilizumab, ustekinumab, vedolizumab certain medicines for fungal infections like fluconazole, itraconazole, ketoconazole, voriconazole certain medicines for seizures like carbamazepine,  phenobarbital, phenytoin cyclosporine live vaccines medicines that lower your chance of fighting infection rifampin supplements, such as St. John's wort tacrolimus This list may not describe all possible interactions. Give your health care provider a list of all the medicines, herbs, non-prescription drugs, or dietary supplements you use. Also tell them if you smoke, drink alcohol, or use illegal drugs. Some items may interact with your medicine. What should I watch for while using this medication? Visit your health care provider for regular checks on your progress. Tell your health care provider if your symptoms do not start to get better or if they get worse. You may need blood work while you are taking this  medicine. This medicine may increase your risk of getting an infection. Call your health care provider for advice if you get a fever, chills, sore throat, or other symptoms of a cold or flu. Do not treat yourself. Try to avoid being around people who are sick. Avoid taking medicines that contain aspirin, acetaminophen, ibuprofen, naproxen, or ketoprofen unless instructed by your health care provider. These medicines may hide a fever. Talk to your health care provider about your risk of cancer. You may be more at risk for certain types of cancer if you take this medicine. Do not become pregnant while taking this medicine. Women should inform their health care provider if they wish to become pregnant or think they might be pregnant. There is potential for serious harm to an unborn child. Talk to your health care provider for more information. Do not breast-feed an infant while taking this medicine or for at least 18 hours after stopping it. What side effects may I notice from receiving this medication? Side effects that you should report to your doctor or health care professional as soon as possible: allergic reactions (skin rash, itching or hives; swelling of the face, lips, or tongue) blood clot (chest pain; shortness of breath; pain, swelling, or warmth in the leg) heart attack (trouble breathing; pain or tightness in the chest, neck, back or arms; unusually weak or tired) infection (fever, chills, cough, sore throat, pain or trouble passing urine) light colored stool liver injury (dark yellow or brown urine; general ill feeling or flu-like symptoms; loss of appetite, right upper belly pain; unusually weak or tired, yellowing of the eyes or skin) low red blood cell counts (trouble breathing; feeling faint; lightheaded, falls; unusually weak or tired) stroke (changes in vision; confusion; trouble speaking or understanding; severe headaches; sudden numbness or weakness of the face, arm or leg; trouble  walking; dizziness; loss of balance or coordination) tears in the stomach or intestines (fever; stomach pain; sudden change in bowel habits) Side effects that usually do not require medical attention (report to your doctor or health care professional if they continue or are bothersome): diarrhea headache muscle pain nasal congestion (runny or stuffy nose) This list may not describe all possible side effects. Call your doctor for medical advice about side effects. You may report side effects to FDA at 1-800-FDA-1088. Where should I keep my medication? Keep out of the reach of children and pets. Store at room temperature between 20 and 25 degrees C (68 and 77 degrees F). Get rid of any unused medicine after the expiration date. To get rid of medicines that are no longer needed or have expired: Take the medicine to a medicine take-back program. Check with your pharmacy or law enforcement to find a location. If you cannot return the medicine, check the label or package insert to  see if the medicine should be thrown out in the garbage or flushed down the toilet. If you are not sure, ask your health care provider. If it is safe to put it in the trash, empty the medicine out of the container. Mix the medicine with cat litter, dirt, coffee grounds, or other unwanted substance. Seal the mixture in a bag or container. Put it in the trash. NOTE: This sheet is a summary. It may not cover all possible information. If you have questions about this medicine, talk to your doctor, pharmacist, or health care provider.  2023 Elsevier/Gold Standard (2021-05-15 00:00:00)

## 2022-01-20 NOTE — Progress Notes (Signed)
Pharmacy Note  Subjective: Patient presents today to [facility] for follow up office visit.   Patient seen by the pharmacist for counseling on Olumiant and Harriette Ohara for rheumatoid arthritis.  Previous medications include:Rinvoq (inadequate clinical response), Orencia (inadequate response 9/18-9/19), Enbrel (inadequate response 3/15/5/15), Humira (inadequate response 11/17-3/18), and injectable methotrexate (non-compliance)  History of diverticulitis:  No  History of MI, stroke, or CV events:  No  Objective:   CMP     Component Value Date/Time   NA 136 12/01/2021 0810   K 4.3 12/01/2021 0810   CL 102 12/01/2021 0810   CO2 22 12/01/2021 0810   GLUCOSE 97 12/01/2021 0810   GLUCOSE 103 (H) 05/28/2021 1416   BUN 12 12/01/2021 0810   CREATININE 0.65 12/01/2021 0810   CREATININE 0.78 05/28/2021 1416   CALCIUM 8.6 (L) 12/01/2021 0810   PROT 6.7 12/01/2021 0810   ALBUMIN 4.3 12/01/2021 0810   AST 16 12/01/2021 0810   ALT 14 12/01/2021 0810   ALKPHOS 80 12/01/2021 0810    CBC    Component Value Date/Time   WBC 4.5 12/01/2021 0810   WBC 5.2 05/28/2021 1416   RBC 3.71 (L) 12/01/2021 0810   RBC 3.73 (L) 05/28/2021 1416   HGB 11.5 12/01/2021 0810   HCT 34.2 12/01/2021 0810   PLT 300 12/01/2021 0810   MCV 92 12/01/2021 0810   MCH 31.0 12/01/2021 0810   MCH 33.0 05/28/2021 1416   MCHC 33.6 12/01/2021 0810   MCHC 34.1 05/28/2021 1416   RDW 14.5 12/01/2021 0810    Baseline Immunosuppressant Therapy Labs TB GOLD    Latest Ref Rng & Units 10/01/2021    9:38 AM  Quantiferon TB Gold  Quantiferon TB Gold Plus NEGATIVE NEGATIVE    Hepatitis Panel   HIV No results found for: "HIV" Immunoglobulins   SPEP    Latest Ref Rng & Units 12/01/2021    8:10 AM  Serum Protein Electrophoresis  Total Protein 6.0 - 8.5 g/dL 6.7    H2CN No results found for: "G6PDH" TPMT No results found for: "TPMT"   LIPIDS Lab Results  Component Value Date   CHOL 148 12/01/2021    TRIG 81 12/01/2021   HDL 57 12/01/2021   LDLCALC 75 12/01/2021    Chest Xray:08/07/18-No edema or consolidation.  Assessment/Plan:  Counseled patient that Olumiant and Harriette Ohara is a JAK inhibitor indicated for Rheumatoid Arthritis.  Counseled patient on purpose, proper use, and adverse effects of Olumiant.    Reviewed the most common adverse effects including infection, diarrhea, headaches.  Also reviewed rare adverse effects such as bowel injury and the need to contact us if they develop stomach pain during treatment. Counseled on the increase risk of venous thrombosis. Counseled about FDA black box warning of MACE (major adverse CV events including cardiovascular death, myocardial infarction, and stroke).  Reviewed with patient that there is the possibility of an increased risk of malignancy specifically lung cancer and lymphomas but it is not well understood if this increased risk is due to the medication or the disease state. Instructed patient that medication should be held for infection and prior to surgery.  Advised patient to avoid live vaccines. Recommend annual influenza, PCV 15 or PCV20 or Pneumovax 23, and Shingrix as indicated.    Reviewed importance of routine lab monitoring including lipid panel.  Will recheck lipid panel 3 months after starting and annually thereafter. CBC and CMP will be monitored routinely every 3 months.  Standing orders placed. Provided patient  with medication education material and answered all questions.  Patient consented to Olumiant.  Will upload into patient's chart.    Patient dose will be Olumiant 2 mg daily.    Patient received counseling for Harriette Ohara. We discussed Harriette Ohara is a JAK inhibitor.  Counseled patient on purpose, proper use, and adverse effects of Xeljanz. Reviewed the most common adverse effects including infection, diarrhea, headaches. Also reviewed rare adverse effects such as bowel injury and the need to contact us if they develop stomach pain  during treatment.  Counseled on the increased risk of DVT/PE.  Counseled about FDA black box warning of MACE (major adverse CV events including cardiovascular death, myocardial infarction, and stroke).  Reviewed with patient that there is the possibility of an increased risk of malignancy specifically lung cancer and lymphomas but it is not well understood if this increased risk is due to the medication or the disease state. Instructed patient that medication should be held for infection and prior to surgery.  Advised patient to avoid live vaccines. Recommend annual influenza, PCV 15 or PCV20 or Pneumovax 23, and Shingrix as indicated.  Counseled that Harriette Ohara should be held for infection and prior to surgery.  Reviewed importance of routine lab monitoring including a lipid panel.  Will recheck lipid panel 3 months after starting and annually thereafter. CBC and CMP will be monitored routinely every 3 months. Standing orders placed and patient is to return in 1 month and then every 3 months after initiation.   Provided patient with medication education material and answered all questions.  Patient consented to [place]. Will upload Harriette Ohara into patient's chart.  We will try to get approval for Olumiant first through insurance. If denied, will move forward with Harriette Ohara 11mg  once daily  , PharmD, MPH, BCPS, CPP Clinical Pharmacist (Rheumatology and Pulmonology)

## 2022-01-20 NOTE — Telephone Encounter (Signed)
Please review prednisone prescription and send to the pharmacy. Thanks!

## 2022-01-20 NOTE — Telephone Encounter (Signed)
Clinical questions populated and submitted to [employer]. Per PA questions, preferred options are: Enbrel, Humira, Kevzara, Orencia [place]/Orencia ClickJect, Rinvoq, and Xeljanz/Xeljanz XR. Cimzia is secondary preferred option.  Will likely be denied and have to try for [place] Shi, PharmD, MPH, BCPS, CPP Clinical Pharmacist (Rheumatology and Pulmonology)

## 2022-01-21 ENCOUNTER — Telehealth: Payer: Self-pay | Admitting: Pharmacist

## 2022-01-21 DIAGNOSIS — M545 Low back pain, unspecified: Secondary | ICD-10-CM | POA: Diagnosis not present

## 2022-01-21 DIAGNOSIS — M5442 Lumbago with sciatica, left side: Secondary | ICD-10-CM | POA: Diagnosis not present

## 2022-01-21 DIAGNOSIS — M0680 Other specified rheumatoid arthritis, unspecified site: Secondary | ICD-10-CM | POA: Diagnosis not present

## 2022-01-21 DIAGNOSIS — M0579 Rheumatoid arthritis with rheumatoid factor of multiple sites without organ or systems involvement: Secondary | ICD-10-CM

## 2022-01-21 DIAGNOSIS — Z79899 Other long term (current) drug therapy: Secondary | ICD-10-CM

## 2022-01-21 NOTE — Telephone Encounter (Signed)
Submitted an URGENT Prior Authorization request to [facility] for [facility] via CoverMyMeds. Will update once we receive a response.   Key: [id number]

## 2022-01-21 NOTE — Telephone Encounter (Signed)
Please start Xeljanz BIV. Submit as urgent if possible.   Dose: 11mg  daily   Dx: Rheumatoid arthritis (M05.9)   Previously tried therapies: Rinvoq - inadequate clinical response Orencia (inadequate response 9/18-9/19) Enbrel (inadequate response 3/15-5/15) Humira (inadequate response 11/17-3/18) injectable methotrexate (non-compliance   Patient eligible for copay card once approved.   01-11-1999, PharmD, MPH, BCPS, CPP Clinical Pharmacist (Rheumatology and Pulmonology)

## 2022-01-21 NOTE — Telephone Encounter (Signed)
Ok to apply for [employer]. Thank you!

## 2022-01-21 NOTE — Telephone Encounter (Signed)
Received a fax regarding Prior Authorization from [facility] for OLUMIANT. Authorization has been DENIED because self-administered primary preferred products for the patient's health plan are Enbrel, Humira, Kevzara, Orencia (subcutaneous)/Orencia ClickJect, Rinvoq, and Xeljanz/Xeljanz XR for rheumatoid arthritis. The self-administered secondary preferred product is Cimzia syringe.  Will move forward with Harriette Ohara authorization  ATC patient to review but unable to reach - left VM advising of this.  Chesley Mires, PharmD, MPH, BCPS, CPP Clinical Pharmacist (Rheumatology and Pulmonology)

## 2022-01-22 DIAGNOSIS — M545 Low back pain, unspecified: Secondary | ICD-10-CM | POA: Diagnosis not present

## 2022-01-22 NOTE — Telephone Encounter (Signed)
Received notification from [facility] regarding a prior authorization for [facility]. Authorization has been APPROVED from 01/21/22 to 01/22/23.   Patient must fill through [facility]: [phone]  Authorization # [id number]  Attempted to enroll patient into copay card but online portal is frozen. Will enroll patient with copay card at office on Monday.  Chesley Mires, PharmD, MPH, BCPS, CPP Clinical Pharmacist (Rheumatology and Pulmonology)

## 2022-01-25 MED ORDER — XELJANZ XR 11 MG PO TB24: 11.0000 mg | ORAL_TABLET | Freq: Every day | ORAL | 0 refills | Status: DC

## 2022-01-25 NOTE — Telephone Encounter (Signed)
Attempted to enroll patient into copay card. Called patient to collect copay card information since it was texted to her.  BIN: [id number] PCN: [id number] Group: [id number] ID: [id number]  Rx for Shannon David 11mg  ER daily [facility].  Patient advised to call later this afternoon at the earliest to set up shipment and provide copay card information. She has been advised she can start the following day after her last Rinvoq dose. She verbalized understanding to all of the above.

## 2022-02-21 ENCOUNTER — Other Ambulatory Visit: Payer: Self-pay | Admitting: Physician Assistant

## 2022-02-22 DIAGNOSIS — M5442 Lumbago with sciatica, left side: Secondary | ICD-10-CM | POA: Diagnosis not present

## 2022-02-22 DIAGNOSIS — M0680 Other specified rheumatoid arthritis, unspecified site: Secondary | ICD-10-CM | POA: Diagnosis not present

## 2022-02-22 DIAGNOSIS — Z6841 Body Mass Index (BMI) 40.0 and over, adult: Secondary | ICD-10-CM | POA: Diagnosis not present

## 2022-02-22 DIAGNOSIS — E559 Vitamin D deficiency, unspecified: Secondary | ICD-10-CM | POA: Diagnosis not present

## 2022-02-22 NOTE — Progress Notes (Deleted)
Office Visit Note  Patient: Shannon David             Date of Birth: [birth year]           MRN: [id number]             PCP: Eunice Blase, PA-C Referring: Eunice Blase, PA-C Visit Date: 03/08/2022 Occupation: @GUAROCC @  Subjective:    History of Present Illness: Shannon David is a 31 y.o. female with history of seropositive rheumatoid arthritis and osteoarthritis. She is taking Plaquenil 200 mg 1 tablet by mouth twice daily Monday through Friday (restarted May 2023), Methotrexate 8 tablets by mouth once weekly, and Xeljanz XR 11mg  by mouth daily.   CBC and CMP drawn on 12/01/2021.  Orders for CBC and CMP were released today.  Her next lab work will be due in December and every 3 months to monitor for drug toxicity. Lipid panel within normal limits on 12/01/2021. TB Gold negative on 10/01/2021 and will continue to be monitored yearly. Discussed the importance of holding methotrexate and 01/31/2022 if she develops signs or symptoms of an infection and to resume once the infection has completely cleared. Because you are taking 12/01/2021, Rinvoq, or Olumiant, it is very important to know that this class of medications has a FDA BLACK BOX WARNING for major adverse cardiovascular events (MACE), thrombosis, mortality (including sudden cardiovascular death), serious infections, and lymphomas. MACE is defined as cardiovascular death, myocardial infarction, and stroke. Thrombosis includes deep venous thrombosis (DVT), pulmonary embolism (PE), and arterial thrombosis. If you are a current or former smoker, you are at higher risk for MACE. PLQ Eye Exam: 01/16/2021 WNL @ [facility].  Patient is overdue to update Plaquenil eye examination.  She is given a Plaquenil eye examination form to take with her to her upcoming appointment.  Activities of Daily Living:  Patient reports morning stiffness for *** {minute/hour:19697}.   Patient {ACTIONS;DENIES/REPORTS:21021675::"Denies"} nocturnal pain.  Difficulty  dressing/grooming: {ACTIONS;DENIES/REPORTS:21021675::"Denies"} Difficulty climbing stairs: {ACTIONS;DENIES/REPORTS:21021675::"Denies"} Difficulty getting out of chair: {ACTIONS;DENIES/REPORTS:21021675::"Denies"} Difficulty using hands for taps, buttons, cutlery, and/or writing: {ACTIONS;DENIES/REPORTS:21021675::"Denies"}  No Rheumatology ROS completed.   PMFS History:  Patient Active Problem List   Diagnosis Date Noted   Pain in left knee 06/04/2021   History of PCOS 02/21/2017   High risk medication use 09/21/2016   Rheumatoid arthritis involving multiple sites with positive rheumatoid factor (HCC) 09/21/2016   History of hypothyroidism 09/21/2016   History of depression 09/21/2016    Past Medical History:  Diagnosis Date   Arthritis    Rheumatoid arthritis (HCC)     Family History  Problem Relation Age of Onset   Hypertension Mother    Clotting disorder Mother    Cancer Maternal Grandmother        lung   Past Surgical History:  Procedure Laterality Date   BUNIONECTOMY Right    FOOT SURGERY Right 08/24/2021   Social History   Social History Narrative   Left Handed    Lives in a one story home   Drinks Caffeine    Immunization History  Administered Date(s) Administered   PFIZER(Purple Top)SARS-COV-2 Vaccination 10/23/2021     Objective: Vital Signs: There were no vitals taken for this visit.   Physical Exam Vitals and nursing note reviewed.  Constitutional:      Appearance: She is well-developed.  HENT:     Head: Normocephalic and atraumatic.  Eyes:     Conjunctiva/sclera: Conjunctivae normal.  Cardiovascular:     Rate and Rhythm: Normal rate and  regular rhythm.     Heart sounds: Normal heart sounds.  Pulmonary:     Effort: Pulmonary effort is normal.     Breath sounds: Normal breath sounds.  Abdominal:     General: Bowel sounds are normal.     Palpations: Abdomen is soft.  Musculoskeletal:     Cervical back: Normal range of motion.  Skin:     General: Skin is warm and dry.     Capillary Refill: Capillary refill takes less than 2 seconds.  Neurological:     Mental Status: She is alert and oriented to person, place, and time.  Psychiatric:        Behavior: Behavior normal.      Musculoskeletal Exam: ***  CDAI Exam: CDAI Score: -- Patient Global: --; Provider Global: -- Swollen: --; Tender: -- Joint Exam 03/08/2022   No joint exam has been documented for this visit   There is currently no information documented on the homunculus. Go to the Rheumatology activity and complete the homunculus joint exam.  Investigation: No additional findings.  Imaging: No results found.  Recent Labs: Lab Results  Component Value Date   WBC 4.5 12/01/2021   HGB 11.5 12/01/2021   PLT 300 12/01/2021   NA 136 12/01/2021   K 4.3 12/01/2021   CL 102 12/01/2021   CO2 22 12/01/2021   GLUCOSE 97 12/01/2021   BUN 12 12/01/2021   CREATININE 0.65 12/01/2021   BILITOT 0.3 12/01/2021   ALKPHOS 80 12/01/2021   AST 16 12/01/2021   ALT 14 12/01/2021   PROT 6.7 12/01/2021   ALBUMIN 4.3 12/01/2021   CALCIUM 8.6 (L) 12/01/2021   GFRAA 134 07/18/2020   QFTBGOLD Negative 08/13/2016   QFTBGOLDPLUS NEGATIVE 10/01/2021    Speciality Comments: PLQ Eye Exam: 01/16/2021 WNL @ [facility] Follow up in 6 months.   Prior therapy: Orencia (inadequate response 9/18-9/19), Enbrel (inadequate response 3/15/5/15), Humira (inadequate response 11/17-3/18), and injectable methotrexate (non-compliance)  Procedures:  No procedures performed Allergies: Keflex [cephalexin]   Assessment / Plan:     Visit Diagnoses: No diagnosis found.  Orders: No orders of the defined types were placed in this encounter.  No orders of the defined types were placed in this encounter.   Face-to-face time spent with patient was *** minutes. Greater than 50% of time was spent in counseling and coordination of care.  Follow-Up Instructions: No follow-ups on  file.   Ellen Henri, CMA  Note - This record has been created using Animal nutritionist.  Chart creation errors have been sought, but may not always  have been located. Such creation errors do not reflect on  the standard of medical care.

## 2022-03-08 ENCOUNTER — Ambulatory Visit: Payer: BC Managed Care – PPO | Admitting: Physician Assistant

## 2022-03-08 DIAGNOSIS — M65341 Trigger finger, right ring finger: Secondary | ICD-10-CM

## 2022-03-08 DIAGNOSIS — Z8659 Personal history of other mental and behavioral disorders: Secondary | ICD-10-CM

## 2022-03-08 DIAGNOSIS — M170 Bilateral primary osteoarthritis of knee: Secondary | ICD-10-CM

## 2022-03-08 DIAGNOSIS — M0579 Rheumatoid arthritis with rheumatoid factor of multiple sites without organ or systems involvement: Secondary | ICD-10-CM

## 2022-03-08 DIAGNOSIS — G8929 Other chronic pain: Secondary | ICD-10-CM

## 2022-03-08 DIAGNOSIS — M21961 Unspecified acquired deformity of right lower leg: Secondary | ICD-10-CM

## 2022-03-08 DIAGNOSIS — Z8742 Personal history of other diseases of the female genital tract: Secondary | ICD-10-CM

## 2022-03-08 DIAGNOSIS — M21942 Unspecified acquired deformity of hand, left hand: Secondary | ICD-10-CM

## 2022-03-08 DIAGNOSIS — Z8639 Personal history of other endocrine, nutritional and metabolic disease: Secondary | ICD-10-CM

## 2022-03-08 DIAGNOSIS — Z79899 Other long term (current) drug therapy: Secondary | ICD-10-CM

## 2022-03-11 ENCOUNTER — Other Ambulatory Visit: Payer: Self-pay | Admitting: Physician Assistant

## 2022-03-11 NOTE — Telephone Encounter (Signed)
Next Visit: Due September 2023. Message sent to the front to schedule.   Last Visit: 01/20/2022  Last Fill: 12/10/2021  DX: Rheumatoid arthritis involving multiple sites with positive rheumatoid factor  Current Dose per office note 01/20/2022: Methotrexate 8 tablets by mouth once weekly  Labs: 12/01/2021 Calcium is low-8.6.  Rest of CMP WNL.  Lipid panel WNL.   RBC count is borderline low but stable. Rest of CBC WNL.  Okay to refill MTX?

## 2022-03-11 NOTE — Telephone Encounter (Signed)
Please schedule patient a follow up visit. Patient due September 2023. Thanks!  

## 2022-03-11 NOTE — Telephone Encounter (Signed)
LMOM for patient to call and reschedule appointment. °

## 2022-03-25 NOTE — Progress Notes (Unsigned)
Office Visit Note  Patient: Shannon David             Date of Birth: 1990/12/21           MRN: [id number]             PCP: Janine Limbo, PA-C Referring: Janine Limbo, PA-C Visit Date: 04/05/2022 Occupation: @GUAROCC @  Subjective:  Mediation monitoring   History of Present Illness: Shannon David is a 31 y.o. female with history of seropositive rheumatoid arthritis and osteoarthritis.  Patient remains on Plaquenil 200 mg 1 tablet by mouth twice daily Monday through Friday (restarted May 2023), Methotrexate 8 tablets by mouth once weekly, and  xeljanz 11 mg XR daily.  Patient was started on xeljanz at the beginning of august 2023.  She has been tolerating Xeljanz without any side effects.  She denies any recent or recurrent infections.  She reports that since switching from her group to [place] her symptoms have improved significantly.  Her improvement has been almost 100%.  She states that switching to [name] has been a night and day difference from how she was previously feeling.  She denies any joint swelling currently.  She has not had to have any injections in her feet by her podiatrist recently.  Patient reports at work she has had a promotion and will be starting night shift but it will be significantly less physically demanding than her previous job role. She denies any recent or recurrent infections.  She is not planning on getting the flu shot.  She is open to getting the Shingrix vaccination series.  She denies any new medical conditions. She denies any new questions or concerns.      Activities of Daily Living:  Patient reports morning stiffness for a few minutes.   Patient Reports nocturnal pain.  Difficulty dressing/grooming: Denies Difficulty climbing stairs: Denies Difficulty getting out of chair: Denies Difficulty using hands for taps, buttons, cutlery, and/or writing: Reports  Review of Systems  Constitutional:  Positive for fatigue.  HENT:  Negative for mouth  sores and mouth dryness.   Eyes:  Negative for dryness.  Respiratory:  Negative for shortness of breath.   Cardiovascular:  Negative for chest pain and palpitations.  Gastrointestinal:  Negative for blood in stool, constipation and diarrhea.  Endocrine: Negative for increased urination.  Genitourinary:  Negative for involuntary urination.  Musculoskeletal:  Positive for morning stiffness. Negative for joint pain, gait problem, joint pain, joint swelling, myalgias, muscle weakness, muscle tenderness and myalgias.  Skin:  Positive for sensitivity to sunlight. Negative for color change, rash and hair loss.  Allergic/Immunologic: Positive for susceptible to infections.  Neurological:  Positive for headaches. Negative for dizziness.  Hematological:  Negative for swollen glands.  Psychiatric/Behavioral:  Negative for depressed mood and sleep disturbance. The patient is not nervous/anxious.     PMFS History:  Patient Active Problem List   Diagnosis Date Noted   Pain in left knee 06/04/2021   History of PCOS 02/21/2017   High risk medication use 09/21/2016   Rheumatoid arthritis involving multiple sites with positive rheumatoid factor ([place]) 09/21/2016   History of hypothyroidism 09/21/2016   History of depression 09/21/2016    Past Medical History:  Diagnosis Date   Arthritis    History of depression    History of hypothyroidism    History of PCOS    Rheumatoid arthritis ([place])     Family History  Problem Relation Age of Onset   Hypertension Mother    Clotting disorder  Mother    Lung cancer Paternal Uncle    Cancer Maternal Grandmother        lung   Past Surgical History:  Procedure Laterality Date   BUNIONECTOMY Right    FOOT SURGERY Right 08/24/2021   Social History   Social History Narrative   Left Handed    Lives in a one story home   Drinks Caffeine    Immunization History  Administered Date(s) Administered   PFIZER(Purple Top)SARS-COV-2 Vaccination 10/23/2021      Objective: Vital Signs: BP 121/82 (BP Location: Left Arm, Patient Position: Sitting, Cuff Size: Normal)   Pulse 76   Resp 17   Ht 5\' 2"  (1.575 m)   Wt 248 lb 3.2 oz (112.6 kg)   BMI 45.40 kg/m    Physical Exam Vitals and nursing note reviewed.  Constitutional:      Appearance: She is well-developed.  HENT:     Head: Normocephalic and atraumatic.  Eyes:     Conjunctiva/sclera: Conjunctivae normal.  Cardiovascular:     Rate and Rhythm: Normal rate and regular rhythm.     Heart sounds: Normal heart sounds.  Pulmonary:     Effort: Pulmonary effort is normal.     Breath sounds: Normal breath sounds.  Abdominal:     General: Bowel sounds are normal.     Palpations: Abdomen is soft.  Musculoskeletal:     Cervical back: Normal range of motion.  Skin:    General: Skin is warm and dry.     Capillary Refill: Capillary refill takes less than 2 seconds.  Neurological:     Mental Status: She is alert and oriented to person, place, and time.  Psychiatric:        Behavior: Behavior normal.      Musculoskeletal Exam: C-spine has good range of motion with no discomfort.  Shoulder joints have good range of motion with no discomfort.  Elbow joints have good range of motion with no tenderness or inflammation.  Limited extension of both wrist joints with synovial thickening but no tenderness or active inflammation.  Tenderness and ulnar deviation of all MCP joints.  Incomplete fist formation noted.  Mild flexion contracture of the left DIP of the fifth digit.  Hip joints have good range of motion with no groin pain.  Knee joints have good range of motion with no warmth or effusion.  Ankle joints have good range of motion with no tenderness or synovitis.  No tenderness over MTP joints.  CDAI Exam: CDAI Score: -- Patient Global: 3 mm; Provider Global: 3 mm Swollen: --; Tender: -- Joint Exam 04/05/2022   No joint exam has been documented for this visit   There is currently no information  documented on the homunculus. Go to the Rheumatology activity and complete the homunculus joint exam.  Investigation: No additional findings.  Imaging: No results found.  Recent Labs: Lab Results  Component Value Date   WBC 4.7 03/31/2022   HGB 12.5 03/31/2022   PLT 285 03/31/2022   NA 140 03/31/2022   K 4.4 03/31/2022   CL 102 03/31/2022   CO2 22 03/31/2022   GLUCOSE 88 03/31/2022   BUN 11 03/31/2022   CREATININE 0.58 03/31/2022   BILITOT 0.4 03/31/2022   ALKPHOS 92 03/31/2022   AST 15 03/31/2022   ALT 15 03/31/2022   PROT 6.6 03/31/2022   ALBUMIN 4.3 03/31/2022   CALCIUM 9.1 03/31/2022   GFRAA 134 07/18/2020   QFTBGOLD Negative 08/13/2016   QFTBGOLDPLUS NEGATIVE 10/01/2021  Speciality Comments: PLQ Eye Exam: 01/16/2021 WNL @ [facility] Follow up in 6 months.   Prior therapy: Orencia (inadequate response 9/18-9/19), Enbrel (inadequate response 3/15/5/15), Humira (inadequate response 11/17-3/18), and injectable methotrexate (non-compliance)  Procedures:  No procedures performed Allergies: Keflex [cephalexin]   Assessment / Plan:     Visit Diagnoses: Rheumatoid arthritis involving multiple sites with positive rheumatoid factor ([place]) - Severe, erosive rheumatoid arthritis involving multiple joints: Patient has no active synovitis on examination today.  She has noticed nearly 100% improvement in her joint pain, stiffness, and swelling since switching from Rinvoq to [place] at the beginning of August 2023.  She remains on Plaquenil 200 mg 1 tablet by mouth twice daily Monday through Friday and methotrexate 8 tablets by mouth once weekly.  She is tolerating triple therapy without any side effects and has not missed any doses recently.  She has not had any recent or recurrent infections.  She has no real thickening and limited range of motion of both wrist joints and over MCP joints but no active inflammation was noted.  The patient has had a promotion at work and will be  changing job duties as well as starting night shift at the beginning of November 2023.  She is hopeful that having less overuse and repetitive activities will help with the frequency of flares.  She will remain on Xeljanz, Plaquenil, and methotrexate as prescribed.  She was advised to notify us if she develops increased joint pain or joint swelling.  She will follow-up in the office in 3 months or sooner if needed.  High risk medication use - Plaquenil 200 mg 1 tablet by mouth twice daily Monday through Friday (restarted May 2023), Methotrexate 8 tablets by mouth once weekly, and Xeljanz 11 mg XR daily.  Started on [place] in the beginning of August 2023. Inadequate response to Enbrel, Humira, Orencia, and rinvoq.   CBC and CMP WNL on 03/31/22. Next lab work will be due in January and every 3 months.  TB gold negative on 10/01/21. Lipid panel negative on 12/01/21.   Discussed the importance of holding MTX and olumiant if she develops signs or symptoms of an infection and to resume once the infection has completely cleared. Yearly skin cancer screening recommended.  Counseled on the increase risk of venous thrombosis. Counseled about FDA black box warning of MACE (major adverse CV events including cardiovascular death, myocardial infarction, and stroke).   Trigger finger, right ring finger - Resolved.   Chronic left shoulder pain:Resolved.  Good ROM with no discomfort at this time.   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  No active inflammation at this time.   Primary osteoarthritis of both knees: She has good ROM of both knee joints with no discomfort.  No warmth or effusion noted.   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 Cannon Kettle.  She underwent right foot reconstructive surgery on 08/24/2021 which was successful.    Other medical conditions are listed as follows:   History of hypothyroidism  History of depression  History of  PCOS  Orders: No orders of the defined types were placed in this encounter.  Meds ordered this encounter  Medications   ondansetron (ZOFRAN) 4 MG tablet    Sig: Take 1 tablet (4 mg total) by mouth as needed.    Dispense:  30 tablet    Refill:  1      Follow-Up Instructions: Return in about 3 months (  around 07/06/2022) for Rheumatoid arthritis, Osteoarthritis.   Ofilia Neas, PA-C  Note - This record has been created using Dragon software.  Chart creation errors have been sought, but may not always  have been located. Such creation errors do not reflect on  the standard of medical care.

## 2022-03-30 ENCOUNTER — Telehealth: Payer: Self-pay | Admitting: Rheumatology

## 2022-03-30 DIAGNOSIS — Z79899 Other long term (current) drug therapy: Secondary | ICD-10-CM

## 2022-03-30 NOTE — Telephone Encounter (Signed)
Lab Orders released.  

## 2022-03-30 NOTE — Telephone Encounter (Signed)
Patient called requesting labwork orders be sent to [facility] in [place].  Patient plans to go tomorrow morning 03/30/22.

## 2022-04-01 LAB — CBC WITH DIFFERENTIAL/PLATELET
Basophils Absolute: 0 10*3/uL (ref 0.0–0.2)
Basos: 0 %
EOS (ABSOLUTE): 0 10*3/uL (ref 0.0–0.4)
Eos: 0 %
Hematocrit: 36.2 % (ref 34.0–46.6)
Hemoglobin: 12.5 g/dL (ref 11.1–15.9)
Immature Grans (Abs): 0 10*3/uL (ref 0.0–0.1)
Immature Granulocytes: 0 %
Lymphocytes Absolute: 1 10*3/uL (ref 0.7–3.1)
Lymphs: 21 %
MCH: 31.6 pg (ref 26.6–33.0)
MCHC: 34.5 g/dL (ref 31.5–35.7)
MCV: 92 fL (ref 79–97)
Monocytes Absolute: 0.6 10*3/uL (ref 0.1–0.9)
Monocytes: 12 %
Neutrophils Absolute: 3.1 10*3/uL (ref 1.4–7.0)
Neutrophils: 67 %
Platelets: 285 10*3/uL (ref 150–450)
RBC: 3.95 x10E6/uL (ref 3.77–5.28)
RDW: 12.6 % (ref 11.7–15.4)
WBC: 4.7 10*3/uL (ref 3.4–10.8)

## 2022-04-01 LAB — CMP14+EGFR
ALT: 15 IU/L (ref 0–32)
AST: 15 IU/L (ref 0–40)
Albumin/Globulin Ratio: 1.9 (ref 1.2–2.2)
Albumin: 4.3 g/dL (ref 3.9–4.9)
Alkaline Phosphatase: 92 IU/L (ref 44–121)
BUN/Creatinine Ratio: 19 (ref 9–23)
BUN: 11 mg/dL (ref 6–20)
Bilirubin Total: 0.4 mg/dL (ref 0.0–1.2)
CO2: 22 mmol/L (ref 20–29)
Calcium: 9.1 mg/dL (ref 8.7–10.2)
Chloride: 102 mmol/L (ref 96–106)
Creatinine, Ser: 0.58 mg/dL (ref 0.57–1.00)
Globulin, Total: 2.3 g/dL (ref 1.5–4.5)
Glucose: 88 mg/dL (ref 70–99)
Potassium: 4.4 mmol/L (ref 3.5–5.2)
Sodium: 140 mmol/L (ref 134–144)
Total Protein: 6.6 g/dL (ref 6.0–8.5)
eGFR: 122 mL/min/{1.73_m2} (ref >59)

## 2022-04-01 NOTE — Progress Notes (Signed)
CBC and CMP WNL

## 2022-04-05 ENCOUNTER — Encounter: Payer: Self-pay | Admitting: Physician Assistant

## 2022-04-05 ENCOUNTER — Other Ambulatory Visit: Payer: Self-pay | Admitting: Rheumatology

## 2022-04-05 ENCOUNTER — Ambulatory Visit: Payer: BC Managed Care – PPO | Attending: Physician Assistant | Admitting: Physician Assistant

## 2022-04-05 VITALS — BP 121/82 | HR 76 | Resp 17 | Ht 62.0 in | Wt 248.2 lb

## 2022-04-05 DIAGNOSIS — G8929 Other chronic pain: Secondary | ICD-10-CM

## 2022-04-05 DIAGNOSIS — M170 Bilateral primary osteoarthritis of knee: Secondary | ICD-10-CM

## 2022-04-05 DIAGNOSIS — M0579 Rheumatoid arthritis with rheumatoid factor of multiple sites without organ or systems involvement: Secondary | ICD-10-CM | POA: Diagnosis not present

## 2022-04-05 DIAGNOSIS — M21941 Unspecified acquired deformity of hand, right hand: Secondary | ICD-10-CM

## 2022-04-05 DIAGNOSIS — M21962 Unspecified acquired deformity of left lower leg: Secondary | ICD-10-CM

## 2022-04-05 DIAGNOSIS — Z8659 Personal history of other mental and behavioral disorders: Secondary | ICD-10-CM

## 2022-04-05 DIAGNOSIS — M25512 Pain in left shoulder: Secondary | ICD-10-CM

## 2022-04-05 DIAGNOSIS — M069 Rheumatoid arthritis, unspecified: Secondary | ICD-10-CM

## 2022-04-05 DIAGNOSIS — M65341 Trigger finger, right ring finger: Secondary | ICD-10-CM | POA: Diagnosis not present

## 2022-04-05 DIAGNOSIS — M21961 Unspecified acquired deformity of right lower leg: Secondary | ICD-10-CM

## 2022-04-05 DIAGNOSIS — Z79899 Other long term (current) drug therapy: Secondary | ICD-10-CM

## 2022-04-05 DIAGNOSIS — Z8742 Personal history of other diseases of the female genital tract: Secondary | ICD-10-CM

## 2022-04-05 DIAGNOSIS — Z8639 Personal history of other endocrine, nutritional and metabolic disease: Secondary | ICD-10-CM

## 2022-04-05 DIAGNOSIS — M21942 Unspecified acquired deformity of hand, left hand: Secondary | ICD-10-CM

## 2022-04-05 MED ORDER — SHINGRIX 50 MCG/0.5ML IM SUSR: 0.5000 mL | Freq: Once | INTRAMUSCULAR | 0 refills | Status: AC

## 2022-04-05 MED ORDER — ONDANSETRON HCL 4 MG PO TABS
4.0000 mg | ORAL_TABLET | ORAL | 1 refills | Status: DC | PRN
Start: 2022-04-05 — End: 2022-11-23

## 2022-04-05 NOTE — Telephone Encounter (Signed)
Patient called stating she went to [facility] this morning to get her Shingles vaccine and was told by the pharmacist that they need prescription from [facility].  Patient states she plans to go back later today and requested the prescription be [facility] to [facility] in [place].  Patient requested a return call to let her know it has been sent.

## 2022-04-05 NOTE — Patient Instructions (Signed)
Standing Labs We placed an order today for your standing lab work.   Please have your standing labs drawn in January and every 3 months    Please have your labs drawn 2 weeks prior to your appointment so that the provider can discuss your lab results at your appointment.  Please note that you may see your imaging and lab results in MyChart before we have reviewed them. We will contact you once all results are reviewed. Please allow our office up to 72 hours to thoroughly review all of the results before contacting the office for clarification of your results.  Lab hours are: Monday through Thursday from 1:30 pm-4:30 pm and Friday from 1:30 pm- 4:00 pm  You may experience shorter wait times on Monday, Thursday or Friday afternoons,.   Effective April 26, 2022, new lab hours will be: Monday through Thursday from 8:00 am -12:30 pm and 1:00 pm-5:00 pm and Friday from 8:00 am-12:00 pm.  Please be advised, all patients with office appointments requiring lab work will take precedent over walk-in lab work.   Labs are drawn by [facility]. Please bring your co-pay at the time of your lab draw.  You may receive a bill from [facility] for your lab work.  Please note if you are on Hydroxychloroquine and and an order has been placed for a Hydroxychloroquine level, you will need to have it drawn 4 hours or more after your last dose.  If you wish to have your labs drawn at another location, please call the office 24 hours in advance so we can fax the orders.  The office is located at [address], [address], [place], [place] [address] No appointment is necessary.    If you have any questions regarding directions or hours of operation,  please call [phone].   As a reminder, please drink plenty of water prior to coming for your lab work. Thanks!\ If you have signs or symptoms of an infection or start antibiotics: First, call your PCP for workup of your infection. Hold your medication through the  infection, until you complete your antibiotics, and until symptoms resolve if you take the following: Injectable medication (Actemra, Benlysta, Cimzia, Cosentyx, Enbrel, Humira, Kevzara, Orencia, Remicade, Simponi, Stelara, Taltz, Tremfya) Methotrexate Leflunomide (Arava) Mycophenolate (Cellcept) Xeljanz, Olumiant, or Rinvoq   Vaccines You are taking a medication(s) that can suppress your immune system.  The following immunizations are recommended: Flu annually Covid-19  Td/Tdap (tetanus, diphtheria, pertussis) every 10 years Pneumonia (Prevnar 15 then Pneumovax 23 at least 1 year apart.  Alternatively, can take Prevnar 20 without needing additional dose) Shingrix: 2 doses from 4 weeks to 6 months apart  Please check with your PCP to make sure you are up to date.   

## 2022-04-12 ENCOUNTER — Other Ambulatory Visit: Payer: Self-pay | Admitting: Physician Assistant

## 2022-04-12 DIAGNOSIS — Z79899 Other long term (current) drug therapy: Secondary | ICD-10-CM

## 2022-04-12 DIAGNOSIS — M0579 Rheumatoid arthritis with rheumatoid factor of multiple sites without organ or systems involvement: Secondary | ICD-10-CM

## 2022-04-12 NOTE — Telephone Encounter (Signed)
Next Visit: 07/06/2022  Last Visit: 04/05/2022  Last Fill: 01/25/2022  [id number] arthritis involving multiple sites with positive rheumatoid factor   Current Dose per office note 04/05/2022: [name] 11 mg XR daily  Labs: 03/31/2022 CBC and CMP WNL  TB Gold: 10/01/2021 Neg    Okay to refill [name]?

## 2022-05-03 ENCOUNTER — Telehealth: Payer: Self-pay | Admitting: Rheumatology

## 2022-05-03 NOTE — Telephone Encounter (Signed)
Patient called the office stating she recently tested positive for Covid. Patient states she is taking plaquenil, Methotrexate and Xeljanz. Patient would like to know if she needs to hold any medications.

## 2022-05-03 NOTE — Telephone Encounter (Signed)
Contacted patient and reviewed the following with patient.   If you test POSITIVE for COVID19 and have MILD to MODERATE symptoms: First, call your PCP if you would like to receive COVID19 treatment AND Hold your medications during the infection and for at least 1 week after your symptoms have resolved: Injectable medication (Benlysta, Cimzia, Cosentyx, Enbrel, Humira, Orencia, Remicade, Simponi, Stelara, Taltz, Tremfya) Methotrexate Leflunomide (Arava) Azathioprine Mycophenolate (Cellcept) Roma Kayser, or Rinvoq Otezla If you take Actemra or Kevzara, you DO NOT need to hold these for COVID19 infection.  If you test POSITIVE for COVID19 and have NO symptoms: First, call your PCP if you would like to receive COVID19 treatment AND Hold your medications for at least 10 days after the day that you tested positive Injectable medication (Benlysta, Cimzia, Cosentyx, Enbrel, Humira, Orencia, Remicade, Simponi, Stelara, Taltz, Tremfya) Methotrexate Leflunomide (Arava) Azathioprine Mycophenolate (Cellcept) Roma Kayser, or Rinvoq Otezla If you take Actemra or Kevzara, you DO NOT need to hold these for COVID19 infection.

## 2022-05-14 NOTE — Progress Notes (Unsigned)
Office Visit Note  Patient: Shannon David             Date of Birth: 14-Dec-1990           MRN: [id number]             PCP: Eunice Blase, PA-C Referring: Eunice Blase, PA-C Visit Date: 05/18/2022 Occupation: @GUAROCC @  Subjective:  No chief complaint on file.   History of Present Illness: Shannon David is a 31 y.o. female ***   Activities of Daily Living:  Patient reports morning stiffness for *** {minute/hour:19697}.   Patient {ACTIONS;DENIES/REPORTS:21021675::"Denies"} nocturnal pain.  Difficulty dressing/grooming: {ACTIONS;DENIES/REPORTS:21021675::"Denies"} Difficulty climbing stairs: {ACTIONS;DENIES/REPORTS:21021675::"Denies"} Difficulty getting out of chair: {ACTIONS;DENIES/REPORTS:21021675::"Denies"} Difficulty using hands for taps, buttons, cutlery, and/or writing: {ACTIONS;DENIES/REPORTS:21021675::"Denies"}  No Rheumatology ROS completed.   PMFS History:  Patient Active Problem List   Diagnosis Date Noted   Pain in left knee 06/04/2021   History of PCOS 02/21/2017   High risk medication use 09/21/2016   Rheumatoid arthritis involving multiple sites with positive rheumatoid factor (HCC) 09/21/2016   History of hypothyroidism 09/21/2016   History of depression 09/21/2016    Past Medical History:  Diagnosis Date   Arthritis    History of depression    History of hypothyroidism    History of PCOS    Rheumatoid arthritis (HCC)     Family History  Problem Relation Age of Onset   Hypertension Mother    Clotting disorder Mother    Lung cancer Paternal Uncle    Cancer Maternal Grandmother        lung   Past Surgical History:  Procedure Laterality Date   BUNIONECTOMY Right    FOOT SURGERY Right 08/24/2021   Social History   Social History Narrative   Left Handed    Lives in a one story home   Drinks Caffeine    Immunization History  Administered Date(s) Administered   PFIZER(Purple Top)SARS-COV-2 Vaccination 10/23/2021     Objective: Vital  Signs: There were no vitals taken for this visit.   Physical Exam   Musculoskeletal Exam: ***  CDAI Exam: CDAI Score: -- Patient Global: --; Provider Global: -- Swollen: --; Tender: -- Joint Exam 05/18/2022   No joint exam has been documented for this visit   There is currently no information documented on the homunculus. Go to the Rheumatology activity and complete the homunculus joint exam.  Investigation: No additional findings.  Imaging: No results found.  Recent Labs: Lab Results  Component Value Date   WBC 4.7 03/31/2022   HGB 12.5 03/31/2022   PLT 285 03/31/2022   NA 140 03/31/2022   K 4.4 03/31/2022   CL 102 03/31/2022   CO2 22 03/31/2022   GLUCOSE 88 03/31/2022   BUN 11 03/31/2022   CREATININE 0.58 03/31/2022   BILITOT 0.4 03/31/2022   ALKPHOS 92 03/31/2022   AST 15 03/31/2022   ALT 15 03/31/2022   PROT 6.6 03/31/2022   ALBUMIN 4.3 03/31/2022   CALCIUM 9.1 03/31/2022   GFRAA 134 07/18/2020   QFTBGOLD Negative 08/13/2016   QFTBGOLDPLUS NEGATIVE 10/01/2021    Speciality Comments: PLQ Eye Exam: 01/16/2021 WNL @ [facility] Follow up in 6 months.   Prior therapy: Orencia (inadequate response 9/18-9/19), Enbrel (inadequate response 3/15/5/15), Humira (inadequate response 11/17-3/18), and injectable methotrexate (non-compliance)  Procedures:  No procedures performed Allergies: Keflex [cephalexin]   Assessment / Plan:     Visit Diagnoses: No diagnosis found.  Orders: No orders of the defined types were placed in  this encounter.  No orders of the defined types were placed in this encounter.   Face-to-face time spent with patient was *** minutes. Greater than 50% of time was spent in counseling and coordination of care.  Follow-Up Instructions: No follow-ups on file.   Ellen Henri, CMA  Note - This record has been created using Animal nutritionist.  Chart creation errors have been sought, but may not always  have been located. Such creation  errors do not reflect on  the standard of medical care.

## 2022-05-18 ENCOUNTER — Ambulatory Visit: Payer: BC Managed Care – PPO | Attending: Rheumatology | Admitting: Rheumatology

## 2022-05-18 ENCOUNTER — Encounter: Payer: Self-pay | Admitting: Rheumatology

## 2022-05-18 VITALS — BP 105/72 | HR 93 | Resp 15 | Ht 62.0 in | Wt 250.0 lb

## 2022-05-18 DIAGNOSIS — Z79899 Other long term (current) drug therapy: Secondary | ICD-10-CM

## 2022-05-18 DIAGNOSIS — Z8742 Personal history of other diseases of the female genital tract: Secondary | ICD-10-CM

## 2022-05-18 DIAGNOSIS — G8929 Other chronic pain: Secondary | ICD-10-CM

## 2022-05-18 DIAGNOSIS — M65341 Trigger finger, right ring finger: Secondary | ICD-10-CM

## 2022-05-18 DIAGNOSIS — M21961 Unspecified acquired deformity of right lower leg: Secondary | ICD-10-CM

## 2022-05-18 DIAGNOSIS — M21942 Unspecified acquired deformity of hand, left hand: Secondary | ICD-10-CM

## 2022-05-18 DIAGNOSIS — M21962 Unspecified acquired deformity of left lower leg: Secondary | ICD-10-CM

## 2022-05-18 DIAGNOSIS — M0579 Rheumatoid arthritis with rheumatoid factor of multiple sites without organ or systems involvement: Secondary | ICD-10-CM

## 2022-05-18 DIAGNOSIS — M21941 Unspecified acquired deformity of hand, right hand: Secondary | ICD-10-CM

## 2022-05-18 DIAGNOSIS — Z8639 Personal history of other endocrine, nutritional and metabolic disease: Secondary | ICD-10-CM

## 2022-05-18 DIAGNOSIS — M170 Bilateral primary osteoarthritis of knee: Secondary | ICD-10-CM

## 2022-05-18 DIAGNOSIS — Z8701 Personal history of pneumonia (recurrent): Secondary | ICD-10-CM

## 2022-05-18 DIAGNOSIS — Z8659 Personal history of other mental and behavioral disorders: Secondary | ICD-10-CM

## 2022-05-18 DIAGNOSIS — M069 Rheumatoid arthritis, unspecified: Secondary | ICD-10-CM

## 2022-05-18 NOTE — Patient Instructions (Signed)
Standing Labs We placed an order today for your standing lab work.   Please have your standing labs drawn in January and every 3 months   Please have your labs drawn 2 weeks prior to your appointment so that the provider can discuss your lab results at your appointment.  Please note that you may see your imaging and lab results in MyChart before we have reviewed them. We will contact you once all results are reviewed. Please allow our office up to 72 hours to thoroughly review all of the results before contacting the office for clarification of your results.  Lab hours are:   Monday through Thursday from 8:00 am -12:30 pm and 1:00 pm-5:00 pm and Friday from 8:00 am-12:00 pm.  Please be advised, all patients with office appointments requiring lab work will take precedent over walk-in lab work.   Labs are drawn by [facility]. Please bring your co-pay at the time of your lab draw.  You may receive a bill from [facility] for your lab work.  Please note if you are on Hydroxychloroquine and and an order has been placed for a Hydroxychloroquine level, you will need to have it drawn 4 hours or more after your last dose.  If you wish to have your labs drawn at another location, please call the office 24 hours in advance so we can fax the orders.  The office is located at [address], [address], [place], [place] [address] No appointment is necessary.    If you have any questions regarding directions or hours of operation,  please call [phone].   As a reminder, please drink plenty of water prior to coming for your lab work. Thanks!  If you have signs or symptoms of an infection or start antibiotics: First, call your PCP for workup of your infection. Hold your medication through the infection, until you complete your antibiotics, and until symptoms resolve if you take the following: Injectable medication (Actemra, Benlysta, Cimzia, Cosentyx, Enbrel, Humira, Kevzara, Orencia, Remicade, Simponi,  Stelara, Taltz, Tremfya) Methotrexate Leflunomide (Arava) Mycophenolate (Cellcept) Xeljanz, Olumiant, or Rinvoq   Vaccines You are taking a medication(s) that can suppress your immune system.  The following immunizations are recommended: Flu annually Covid-19  Td/Tdap (tetanus, diphtheria, pertussis) every 10 years Pneumonia (Prevnar 15 then Pneumovax 23 at least 1 year apart.  Alternatively, can take Prevnar 20 without needing additional dose) Shingrix: 2 doses from 4 weeks to 6 months apart  Please check with your PCP to make sure you are up to date.   

## 2022-05-24 ENCOUNTER — Other Ambulatory Visit: Payer: Self-pay

## 2022-05-24 ENCOUNTER — Encounter (HOSPITAL_BASED_OUTPATIENT_CLINIC_OR_DEPARTMENT_OTHER): Payer: Self-pay

## 2022-05-24 ENCOUNTER — Emergency Department (HOSPITAL_BASED_OUTPATIENT_CLINIC_OR_DEPARTMENT_OTHER)
Admission: EM | Admit: 2022-05-24 | Discharge: 2022-05-24 | Disposition: A | Discharge status: Home / Self Care | Payer: BC Managed Care – PPO | Attending: Emergency Medicine | Admitting: Emergency Medicine

## 2022-05-24 ENCOUNTER — Emergency Department (HOSPITAL_BASED_OUTPATIENT_CLINIC_OR_DEPARTMENT_OTHER): Payer: BC Managed Care – PPO

## 2022-05-24 DIAGNOSIS — J189 Pneumonia, unspecified organism: Secondary | ICD-10-CM | POA: Insufficient documentation

## 2022-05-24 DIAGNOSIS — Z8739 Personal history of other diseases of the musculoskeletal system and connective tissue: Secondary | ICD-10-CM

## 2022-05-24 DIAGNOSIS — R0602 Shortness of breath: Secondary | ICD-10-CM

## 2022-05-24 DIAGNOSIS — Z8701 Personal history of pneumonia (recurrent): Secondary | ICD-10-CM

## 2022-05-24 DIAGNOSIS — M069 Rheumatoid arthritis, unspecified: Secondary | ICD-10-CM | POA: Diagnosis not present

## 2022-05-24 DIAGNOSIS — E039 Hypothyroidism, unspecified: Secondary | ICD-10-CM | POA: Insufficient documentation

## 2022-05-24 DIAGNOSIS — R0789 Other chest pain: Secondary | ICD-10-CM

## 2022-05-24 HISTORY — DX: COVID-19: U07.1

## 2022-05-24 LAB — BASIC METABOLIC PANEL
Anion gap: 8 (ref 5–15)
BUN: 13 mg/dL (ref 6–20)
CO2: 22 mmol/L (ref 22–32)
Calcium: 8.9 mg/dL (ref 8.9–10.3)
Chloride: 105 mmol/L (ref 98–111)
Creatinine, Ser: 0.62 mg/dL (ref 0.44–1.00)
GFR, Estimated: >60 mL/min (ref >60)
Glucose, Bld: 91 mg/dL (ref 70–99)
Potassium: 4 mmol/L (ref 3.5–5.1)
Sodium: 135 mmol/L (ref 135–145)

## 2022-05-24 LAB — CBC
HCT: 38.2 % (ref 36.0–46.0)
Hemoglobin: 12.9 g/dL (ref 12.0–15.0)
MCH: 31.3 pg (ref 26.0–34.0)
MCHC: 33.8 g/dL (ref 30.0–36.0)
MCV: 92.7 fL (ref 80.0–100.0)
Platelets: 266 10*3/uL (ref 150–400)
RBC: 4.12 MIL/uL (ref 3.87–5.11)
RDW: 13.3 % (ref 11.5–15.5)
WBC: 8.3 10*3/uL (ref 4.0–10.5)
nRBC: 0 % (ref 0.0–0.2)

## 2022-05-24 LAB — D-DIMER, QUANTITATIVE: D-Dimer, Quant: 1.31 ug/mL-FEU — ABNORMAL HIGH (ref 0.00–0.50)

## 2022-05-24 LAB — TROPONIN I (HIGH SENSITIVITY): Troponin I (High Sensitivity): <2 ng/L (ref <18)

## 2022-05-24 LAB — PREGNANCY, URINE: Preg Test, Ur: NEGATIVE

## 2022-05-24 MED ORDER — IOHEXOL 350 MG/ML SOLN
100.0000 mL | Freq: Once | INTRAVENOUS | Status: AC | PRN
  Administered 2022-05-24: 100 mL via INTRAVENOUS

## 2022-05-24 MED ORDER — ALBUTEROL SULFATE HFA 108 (90 BASE) MCG/ACT IN AERS
2.0000 | INHALATION_SPRAY | RESPIRATORY_TRACT | Status: DC | PRN
  Administered 2022-05-24: 2 via RESPIRATORY_TRACT
  Filled 2022-05-24: qty 6.7

## 2022-05-24 MED ORDER — ALBUTEROL SULFATE HFA 108 (90 BASE) MCG/ACT IN AERS: 1.0000 | INHALATION_SPRAY | Freq: Four times a day (QID) | RESPIRATORY_TRACT | 0 refills | Status: DC | PRN

## 2022-05-24 MED ORDER — DOXYCYCLINE HYCLATE 100 MG PO CAPS: 100.0000 mg | ORAL_CAPSULE | Freq: Two times a day (BID) | ORAL | 0 refills | Status: AC

## 2022-05-24 NOTE — ED Triage Notes (Signed)
Pt arrives POV, sent from urgent care. Pt dx with covid 11/6, then went into pneumonia and admitted to hospital. Now reports right lateral rib pain sent yesterday morning

## 2022-05-24 NOTE — Discharge Instructions (Addendum)
Your evaluated in the emergency department for shortness of breath.  Your overall workup today was reassuring.  There was no evidence of a clot such as a pulmonary embolism, or rib fractures.  However there was some evidence of pneumonia.  A second antibiotic has been prescribed to you, and sent to the pharmacy.  Please take this as prescribed, and always with plenty food and water.  A refill for albuterol is also been sent to your pharmacy.  Additional treatments may include:  Increased fluid intake. Sports drinks offer valuable electrolytes, sugars, and fluids.  Breathing heated mist or steam (vaporizer or shower).  Eating chicken soup or other clear broths, and maintaining good nutrition.  Getting plenty of rest.  Using lozenges, tea with honey, or warm salt water for cough relief/sore throat relief (Cepkaol or Halls lozenges are available over-the-counter) Increasing usage of your inhaler if you have asthma.   Take over-the-counter Benadryl, Zyrtec, or Claritin to decrease sinus secretions, with Mucinex to help break up remaining mucus Continue to alternate between Tylenol and ibuprofen for pain and fever control.  You may return to work 24 hours after your temperature has returned to normal.  Please follow-up with your PCP within the next 2 to 3 days for reevaluation and continued medical management.  Return to the ED for any new or worsening symptoms as discussed.

## 2022-05-24 NOTE — ED Provider Notes (Signed)
[facility] EMERGENCY DEPARTMENT Provider Note   CSN: [id number] Arrival date & time: 05/24/22  0626     History  Chief Complaint  Patient presents with   Shortness of Breath    Shannon David is a 31 y.o. female presenting with chief complaint of worsening shortness of breath, prolonged cough, and new sharp pain on the right side of her chest.  Seen at UC earlier today, recommended to come to ED for further evaluation due to concerning recent history.  Chronic Hx of rheumatoid arthritis, hypothyroidism, PCOS, depression.  As overview, upper respiratory symptoms started initially 1 month ago, in which patient tested positive for COVID.  Subsequently developed secondary pneumonia, believed to be bacterial.  Was treated with antibiotics and steroids, which she finished on 05/18/2022 when she followed up with rheumatology.  Was still with prolonged productive cough at that time, however without shortness of breath or chest pain at that time.  Was planning to follow-up with PCP in about 1 week.  However yesterday began with sudden onset of sharp right-sided chest pain.  Worse with deep breathing or cough.  Not worse with exertion.  Still without recent fevers, chills, N/V/D, or changes in urinary symptoms.  Denies neck stiffness, difficulty swallowing, or sore throat.  Also feels generally fatigued.  The history is provided by the patient and medical records.  Shortness of Breath      Home Medications Prior to Admission medications   Medication Sig Start Date End Date Taking? Authorizing Provider  albuterol (VENTOLIN HFA) 108 (90 Base) MCG/ACT inhaler Inhale 1-2 puffs into the lungs every 6 (six) hours as needed for wheezing or shortness of breath. 05/24/22  Yes Cecil Cobbs, PA-C  doxycycline (VIBRAMYCIN) 100 MG capsule Take 1 capsule (100 mg total) by mouth 2 (two) times daily for 7 days. 05/24/22 05/31/22 Yes Cecil Cobbs, PA-C  acetaminophen (TYLENOL) 500 MG  tablet Take 1,000 mg by mouth every 6 (six) hours as needed for mild pain.    [provider]  Cholecalciferol (VITAMIN D3) 50 MCG (2000 UT) capsule Take 2,000 Units by mouth daily. 10/13/21   [provider]  folic acid (FOLVITE) 1 MG tablet TAKE 2 TABLETS BY MOUTH EVERY DAY 02/02/21   Pollyann Savoy, MD  gabapentin (NEURONTIN) 300 MG capsule gabapentin 300 mg capsule  TAKE 1 CAPSULE BY MOUTH EVERYDAY AT BEDTIME    [provider]  hydroxychloroquine (PLAQUENIL) 200 MG tablet TAKE 1 TABLET BY MOUTH TWICE A DAY MONDAY-FRIDAY 01/11/22   Gearldine Bienenstock, PA-C  ibuprofen (ADVIL) 800 MG tablet Take 1 tablet (800 mg total) by mouth every 8 (eight) hours as needed. For mild pain and inflammation while you are off your rheumatologic meds 08/24/21   Asencion Islam, DPM  levothyroxine (SYNTHROID) 88 MCG tablet Take 88 mcg by mouth every morning. 03/12/21   [provider]  methotrexate (RHEUMATREX) 2.5 MG tablet TAKE 8 TABLETS (20 MG TOTAL) BY MOUTH ONCE A WEEK. CAUTION:CHEMOTHERAPY. PROTECT FROM LIGHT. 03/11/22   Pollyann Savoy, MD  ondansetron (ZOFRAN) 4 MG tablet Take 1 tablet (4 mg total) by mouth as needed. 04/05/22   Gearldine Bienenstock, PA-C  PARoxetine (PAXIL) 40 MG tablet Take 40 mg by mouth every morning. 12/02/20   [provider]  promethazine (PHENERGAN) 12.5 MG tablet Take 1 tablet (12.5 mg total) by mouth every 6 (six) hours as needed for nausea or vomiting. 08/24/21   Asencion Islam, DPM  SRONYX 0.1-20 MG-MCG tablet Take 1 tablet  by mouth daily. 12/24/19   [provider]  XELJANZ XR 11 MG TB24 TAKE ONE TABLET BY MOUTH ONCE DAILY. MAY BE TAKEN WITH OR WITHOUT FOOD. SWALLOW TABLET WHOLE. DO NOT CRUSH, SPLIT OR CHEW. STORE AT ROOM TEMPERATURE. 04/12/22   Pollyann Savoy, MD      Allergies    Keflex [cephalexin]    Review of Systems   Review of Systems  Respiratory:  Positive for shortness of breath.     Physical Exam Updated Vital Signs BP  120/75   Pulse 82   Temp 97.9 F (36.6 C) (Oral)   Resp 17   Ht 5\' 2"  (1.575 m)   Wt 113.4 kg   LMP 05/07/2022   SpO2 96%   BMI 45.73 kg/m  Physical Exam Vitals and nursing note reviewed.  Constitutional:      General: She is not in acute distress.    Appearance: She is well-developed. She is not ill-appearing, toxic-appearing or diaphoretic.  HENT:     Head: Normocephalic and atraumatic.     Comments: No meningismus or torticollis    Mouth/Throat:     Mouth: Mucous membranes are moist.     Pharynx: Oropharynx is clear. No pharyngeal swelling.     Comments: No voice changes  Eyes:     Conjunctiva/sclera: Conjunctivae normal.  Cardiovascular:     Rate and Rhythm: Normal rate and regular rhythm.     Pulses:          Radial pulses are 2+ on the right side and 2+ on the left side.     Heart sounds: No murmur heard.    Comments: Without clinical evidence of DVT. Pulmonary:     Effort: Pulmonary effort is normal. No respiratory distress.     Breath sounds: Normal breath sounds. No stridor. No decreased breath sounds, wheezing or rales.     Comments: Able to communicate without difficulty, equal chest rise, CTAB, without increased respiratory effort.  Adequate airway movement.  No retractions, accessory muscle usage, or tachypnea. Chest:     Chest wall: Tenderness (Right chest wall, mild) present. No mass, deformity, swelling, crepitus or edema.    Abdominal:     General: Bowel sounds are normal. There is no distension.     Palpations: Abdomen is soft.     Tenderness: There is no abdominal tenderness. There is no guarding. Negative signs include Murphy's sign and McBurney's sign.  Musculoskeletal:        General: No swelling.     Cervical back: Neck supple.     Right lower leg: No edema.     Left lower leg: No edema.  Skin:    General: Skin is warm and dry.     Capillary Refill: Capillary refill takes less than 2 seconds.  Neurological:     Mental Status: She is alert  and oriented to person, place, and time.  Psychiatric:        Mood and Affect: Mood normal.     ED Results / Procedures / Treatments   Labs (all labs ordered are listed, but only abnormal results are displayed) Labs Reviewed  D-DIMER, QUANTITATIVE - Abnormal; Notable for the following components:      Result Value   D-Dimer, Quant 1.31 (*)    All other components within normal limits  BASIC METABOLIC PANEL  CBC  PREGNANCY, URINE  TROPONIN I (HIGH SENSITIVITY)  TROPONIN I (HIGH SENSITIVITY)    EKG EKG Interpretation  Date/Time:  Monday May 24 2022  38:33:38 EST Ventricular Rate:  102 PR Interval:  156 QRS Duration: 84 QT Interval:  330 QTC Calculation: 430 R Axis:   44 Text Interpretation: Sinus tachycardia Nonspecific T wave abnormality Abnormal ECG When compared with ECG of 07-Aug-2018 07:16, PREVIOUS ECG IS PRESENT Confirmed by Blane Ohara [phone]) on 05/25/2022 3:28:58 PM  Radiology CT Angio Chest PE W/Cm &/Or Wo Cm  Result Date: 05/24/2022 CLINICAL DATA:  Concern for pulmonary embolus, right-sided chest pain positive D-dimer. EXAM: CT ANGIOGRAPHY CHEST WITH CONTRAST TECHNIQUE: Multidetector CT imaging of the chest was performed using the standard protocol during bolus administration of intravenous contrast. Multiplanar CT image reconstructions and MIPs were obtained to evaluate the vascular anatomy. RADIATION DOSE REDUCTION: This exam was performed according to the departmental dose-optimization program which includes automated exposure control, adjustment of the mA and/or kV according to patient size and/or use of iterative reconstruction technique. CONTRAST:  OMNIPAQUE IOHEXOL 350 MG/ML SOLN COMPARISON:  CT chest May 11, 2022. FINDINGS: Cardiovascular: Satisfactory opacification of the pulmonary arteries to the segmental level. No evidence of pulmonary embolism. Normal caliber thoracic aorta without evidence of dissection. Normal heart size. No pericardial  effusion. Mediastinum/Nodes: No suspicious thyroid nodule. No pathologically enlarged mediastinal, hilar or axillary lymph nodes. The esophagus is grossly unremarkable. Lungs/Pleura: Diffuse bronchial wall thickening. Bilateral segmental ground-glass opacities with mosaic attenuation of the lungs. Scattered small pulmonary nodules for instance in the left lower lobe measuring 5 mm on image 36/6 and in the right upper lobe measuring 3 mm on image 27/6, these require no imaging follow-up. No pleural effusion. No pneumothorax. Upper Abdomen: No acute abnormality. Musculoskeletal: No acute osseous abnormality. Review of the MIP images confirms the above findings. IMPRESSION: 1. No evidence of acute pulmonary embolism. 2. Scattered small pulmonary nodules on a background of bilateral segmental ground-glass opacities, mosaic attenuation of the lungs and bronchial wall thickening, favored infectious or inflammatory etiology with small airways disease. Electronically Signed   By: Maudry Mayhew M.D.   On: 05/24/2022 12:32   DG Chest 2 View  Result Date: 05/24/2022 CLINICAL DATA:  Chest pain EXAM: CHEST - 2 VIEW COMPARISON:  Radiograph 05/11/2022, CT in [id number] FINDINGS: Unchanged cardiomediastinal silhouette. There is no pleural effusion or pneumothorax. Mild perihilar bronchial wall thickening. No focal airspace consolidation or worsening of the infectious/inflammatory process seen on recent chest CT. No acute osseous abnormality. IMPRESSION: Mild bronchial wall thickening may suggest bronchitis. No focal airspace consolidation. Electronically Signed   By: Caprice Renshaw M.D.   On: 05/24/2022 10:02    Procedures Procedures    Medications Ordered in ED Medications  albuterol (VENTOLIN HFA) 108 (90 Base) MCG/ACT inhaler 2 puff (2 puffs Inhalation Given 05/24/22 1027)  iohexol (OMNIPAQUE) 350 MG/ML injection 100 mL (100 mLs Intravenous Contrast Given 05/24/22 1157)    ED Course/ Medical Decision Making/  A&P Clinical Course as of 05/24/22 1415  Mon May 24, 2022  1100 D-Dimer, Quant(!): 1.31 [AC]  1240 CT Angio Chest PE W/Cm &/Or Wo Cm Neg for PE or rib fracture [AC]    Clinical Course User Index [AC] Cecil Cobbs, PA-C                           Medical Decision Making Amount and/or Complexity of Data Reviewed Labs: ordered. Decision-making details documented in ED Course. Radiology: ordered. Decision-making details documented in ED Course.  Risk Prescription drug management.   30 y.o. female presents to the ED for  concern of Shortness of Breath     This involves an extensive number of treatment options, and is a complaint that carries with it a high risk of complications and morbidity.  The emergent differential diagnosis prior to evaluation includes, but is not limited to: PE, rib fracture, pneumonia, bronchitis  This is not an exhaustive differential.   Past Medical History / Co-morbidities / Social History: Hx of recent pneumonia.  Also hx of rheumatoid arthritis. Social Determinants of Health include: None  Additional History:  Obtained by chart review.  Notably recent rheumatology visits, see for details.  Lab Tests: I ordered, and personally interpreted labs.  The pertinent results include:   CBC without elevated WBC or evidence of anemia BMP without evidence of electrolyte derangement, AKI, or glycemic abnormality Troponin less than 2 D-dimer 1.31 Urine pregnancy negative  Imaging Studies: I ordered imaging studies including CXR and CTA PE study.   I independently visualized and interpreted imaging which showed evidence of pneumonia, without evidence of rib fracture or PE. I agree with the radiologist interpretation.  Cardiac Monitoring: The patient was maintained on a cardiac monitor.  I personally viewed and interpreted the cardiac monitored which showed an underlying rhythm of: Sinus tachycardia and normal sinus rhythm, rate varies between 90 to 110  bpm.  ED Course / Critical Interventions: Pt well-appearing on exam.  Nonseptic non-ill-appearing in NAD.  Afebrile.  AAOx4.  Presenting with right sided chest wall pain worse with palpation, appears pleuritic in nature.    Troponins unremarkable.  EKG unremarkable.  HEART score of 1, low risk.  Considered ASA and nitroglycerin, however low suspicion of ACS do not seem appropriate at this time.  PERC positive due to tachycardia.  Recent Covid history.  D-dimer 1.31.  Remaining labs unremarkable.  No leukocytosis, no fevers.  CXR suggestive of bronchitis.  Unlikely pneumothorax, no findings on CXR.  Unlikely pericarditis/myocarditis, GERD, PUD, as does not fit clinical picture.  Chest pain not exertional, though worsened with deep inspiration.  No evidence of pleural effusion or pulmonary edema on CXR.  Does not appear MSK related, no rib fracture appreciated on CXR.  Unlikely dissection, no pulse deficit, no tearing chest pain, no neurologic complaints.  EKG findings indicate not suggestive of infarction or significant ischemia, abnormal compared to prior due to mild nonspecific ischemic changes.  Troponins <2.  Due to elevated D-dimer, prolonged cough, tachycardia, plan to proceed with CTA PE study to assess for PE vs rib fracture vs pneumonia.  Albuterol ordered. Upon reevaluation, patient status with mild improvement following albuterol administration.  CTA PE study negative for PE or rib fracture, however does indicate pneumonia.  Again known Hx of recent pneumonia.  Difficult to discern whether bacterial or viral in nature.  Finished course of antibiotic, patient believes this to be amoxicillin, 1 week ago.  Believe patient would benefit from second course of antibiotics, such as doxycycline.  Recommend close follow-up with PCP for reevaluation and continued medical management.  Strict return precautions discussed.  Refill for albuterol provided.  Considered another course of steroids, however recently  finished a course of steroids and satting well at 98% on room air without significant wheezing or evidence of respiratory distress.  May reconsider at PCP follow-up.  Patient reports satisfaction with today's encounter.  Patient in NAD and good condition at time of discharge.  I have reviewed the patients home medicines and have made adjustments as needed.  Disposition: After consideration the patient's encounter today, I do not feel  today's workup suggests an emergent condition requiring admission or immediate intervention beyond what has been performed at this time.  Safe for discharge; instructed to return immediately for worsening symptoms, change in symptoms or any other concerns.  I have reviewed the patients home medicines and have made adjustments as needed.  Discussed course of treatment with the patient, whom demonstrated understanding.  Patient in agreement and has no further questions.     I discussed this case with my attending, Dr. Dalene Seltzer, who agreed with the proposed treatment course and cosigned this note.  Attending physician stated agreement with plan or made changes to plan which were implemented.    This chart was dictated using voice recognition software.  Despite best efforts to proofread, errors can occur which can change the documentation meaning.         Final Clinical Impression(s) / ED Diagnoses Final diagnoses:  Shortness of breath  History of rheumatoid arthritis  History of pneumonia  Right-sided chest wall pain    Rx / DC Orders ED Discharge Orders          Ordered    doxycycline (VIBRAMYCIN) 100 MG capsule  2 times daily        05/24/22 1410    albuterol (VENTOLIN HFA) 108 (90 Base) MCG/ACT inhaler  Every 6 hours PRN        05/24/22 1410              Cecil Cobbs, Cordelia Poche 05/28/22 2346    Alvira Monday, MD 05/29/22 1355

## 2022-06-12 ENCOUNTER — Other Ambulatory Visit: Payer: Self-pay | Admitting: Rheumatology

## 2022-06-14 NOTE — Telephone Encounter (Signed)
Next Visit: 08/18/2022  Last Visit: 05/18/2022  Last Fill: 03/11/2022  DX: Rheumatoid arthritis involving multiple sites with positive rheumatoid factor   Current Dose per office note 05/18/2022: Methotrexate 8 tablets by mouth once weekly   Labs: 03/31/2022 CBC and CMP WNL   Okay to refill MTX?

## 2022-07-05 ENCOUNTER — Other Ambulatory Visit: Payer: Self-pay | Admitting: *Deleted

## 2022-07-05 MED ORDER — PREDNISONE 5 MG PO TABS: ORAL_TABLET | ORAL | 0 refills | Status: DC

## 2022-07-05 NOTE — Telephone Encounter (Signed)
Ok to send in prednisone 20 mg tapering by 5 mg every 4 days.  Avoid the use of NSAIDs while taking prednisone.  Take prednisone in the morning with food.  

## 2022-07-05 NOTE — Telephone Encounter (Signed)
I called patient, patient verbalized understanding. 

## 2022-07-06 ENCOUNTER — Ambulatory Visit: Payer: BC Managed Care – PPO | Admitting: Physician Assistant

## 2022-07-08 ENCOUNTER — Other Ambulatory Visit: Payer: Self-pay | Admitting: Rheumatology

## 2022-07-08 DIAGNOSIS — M0579 Rheumatoid arthritis with rheumatoid factor of multiple sites without organ or systems involvement: Secondary | ICD-10-CM

## 2022-07-08 DIAGNOSIS — Z79899 Other long term (current) drug therapy: Secondary | ICD-10-CM

## 2022-07-08 NOTE — Telephone Encounter (Signed)
Next Visit: 08/18/2022   Last Visit: 05/18/2022   Last Fill: 04/12/2022  DX: Rheumatoid arthritis involving multiple sites with positive rheumatoid factor    Current Dose per office note 05/18/2022: [name] 11 mg XR daily   Labs: 05/24/2022 WNL   TB Gold: 10/01/2021 Neg   Okay to refill [name]?

## 2022-07-22 ENCOUNTER — Telehealth: Payer: Self-pay

## 2022-07-22 ENCOUNTER — Other Ambulatory Visit: Payer: Self-pay

## 2022-07-22 DIAGNOSIS — Z79899 Other long term (current) drug therapy: Secondary | ICD-10-CM

## 2022-07-22 NOTE — Telephone Encounter (Signed)
Patient called to have lab orders released for [facility]. CBC and CMP were released. Lipid was not released since patient was not currently fasting today.

## 2022-07-23 LAB — CMP14+EGFR
ALT: 15 IU/L (ref 0–32)
AST: 14 IU/L (ref 0–40)
Albumin/Globulin Ratio: 1.6 (ref 1.2–2.2)
Albumin: 4.3 g/dL (ref 3.9–4.9)
Alkaline Phosphatase: 89 IU/L (ref 44–121)
BUN/Creatinine Ratio: 19 (ref 9–23)
BUN: 13 mg/dL (ref 6–20)
Bilirubin Total: 0.3 mg/dL (ref 0.0–1.2)
CO2: 19 mmol/L — ABNORMAL LOW (ref 20–29)
Calcium: 9.3 mg/dL (ref 8.7–10.2)
Chloride: 103 mmol/L (ref 96–106)
Creatinine, Ser: 0.69 mg/dL (ref 0.57–1.00)
Globulin, Total: 2.7 g/dL (ref 1.5–4.5)
Glucose: 104 mg/dL — ABNORMAL HIGH (ref 70–99)
Potassium: 3.9 mmol/L (ref 3.5–5.2)
Sodium: 138 mmol/L (ref 134–144)
Total Protein: 7 g/dL (ref 6.0–8.5)
eGFR: 117 mL/min/{1.73_m2} (ref >59)

## 2022-07-23 LAB — CBC WITH DIFFERENTIAL/PLATELET
Basophils Absolute: 0 10*3/uL (ref 0.0–0.2)
Basos: 0 %
EOS (ABSOLUTE): 0 10*3/uL (ref 0.0–0.4)
Eos: 0 %
Hematocrit: 37.1 % (ref 34.0–46.6)
Hemoglobin: 12.6 g/dL (ref 11.1–15.9)
Immature Grans (Abs): 0 10*3/uL (ref 0.0–0.1)
Immature Granulocytes: 0 %
Lymphocytes Absolute: 0.9 10*3/uL (ref 0.7–3.1)
Lymphs: 14 %
MCH: 31.1 pg (ref 26.6–33.0)
MCHC: 34 g/dL (ref 31.5–35.7)
MCV: 92 fL (ref 79–97)
Monocytes Absolute: 0.7 10*3/uL (ref 0.1–0.9)
Monocytes: 10 %
Neutrophils Absolute: 5.2 10*3/uL (ref 1.4–7.0)
Neutrophils: 76 %
Platelets: 334 10*3/uL (ref 150–450)
RBC: 4.05 x10E6/uL (ref 3.77–5.28)
RDW: 13.2 % (ref 11.7–15.4)
WBC: 6.9 10*3/uL (ref 3.4–10.8)

## 2022-07-23 NOTE — Progress Notes (Signed)
CBC WNL. Glucose is 104.  Rest CMP WNL.

## 2022-07-27 MED ORDER — PREDNISONE 5 MG PO TABS: ORAL_TABLET | ORAL | 0 refills | Status: DC

## 2022-07-27 NOTE — Telephone Encounter (Signed)
Last visit: 05/18/2022 Patient did receive a prednisone taper on 07/05/2022.  She is scheduled to follow up on 08/18/2022.   Patient is requesting a prednisone taper.

## 2022-07-27 NOTE — Telephone Encounter (Signed)
Okay to send prednisone taper starting at 20 mg p.o. daily and taper by 5 mg every 4 days.  Please notify patient that prednisone can cause hypertension, diabetes, cataracts, osteoporosis and heart disease.

## 2022-08-05 NOTE — Progress Notes (Signed)
Office Visit Note  Patient: Shannon David             Date of Birth: [birth year]           MRN: [id number]             PCP: Ricard Dillon, NP Referring: Janine Limbo, PA-C Visit Date: 08/18/2022 Occupation: @GUAROCC$ @  Subjective:  Frequent flares   History of Present Illness: Shannon David is a 32 y.o. female with history of seropositive rheumatoid arthritis and osteoarthritis.  Patient remains on Plaquenil 200 mg 1 tablet BID Monday through Friday (restarted 10/2021), Methotrexate 8 tablets by mouth once wkly, Xeljanz 11 mg XR daily.folic acid 2 mg daily.  She has been tolerating combination therapy without any side effects.  She has not missed any doses recently.  Patient reports that she continues to have frequent flares.  She states that she has required 2 prednisone tapers since her last office visit.  She states that she woke up this morning with total body stiffness and is also having increased pain and intermittent swelling in her hands and feet.  She has not found that the current treatment regimen has been controlling her arthritis over the past several months.  She would like to discuss other treatment options to better manage her symptoms.  She states that she had a job role change which has not seemed to alleviate her joint pain.  She states that when she is not at work while she does is lay down/sleep.  She has been unable to perform any other activities or hobbies due to the severity of her symptoms and fear for flares. She has been having to take ibuprofen for symptomatic relief.   Activities of Daily Living:  Patient reports morning stiffness for 2 hours.   Patient Reports nocturnal pain.  Difficulty dressing/grooming: Reports Difficulty climbing stairs: Reports Difficulty getting out of chair: Denies Difficulty using hands for taps, buttons, cutlery, and/or writing: Reports  Review of Systems  Constitutional:  Positive for appetite change. Negative for fatigue.   HENT: Negative.  Negative for mouth sores and mouth dryness.   Eyes: Negative.  Negative for dryness.  Respiratory: Negative.  Negative for shortness of breath.   Cardiovascular: Negative.  Negative for chest pain and palpitations.  Gastrointestinal: Negative.  Negative for blood in stool, constipation and diarrhea.  Endocrine: Negative.  Negative for increased urination.  Genitourinary: Negative.  Negative for involuntary urination.  Musculoskeletal:  Positive for joint pain, gait problem, joint pain, joint swelling and morning stiffness. Negative for myalgias, muscle weakness, muscle tenderness and myalgias.  Skin:  Positive for sensitivity to sunlight. Negative for color change, rash and hair loss.  Allergic/Immunologic: Negative.  Negative for susceptible to infections.  Neurological:  Negative for dizziness and headaches.  Hematological: Negative.  Negative for swollen glands.  Psychiatric/Behavioral:  Positive for depressed mood and sleep disturbance. The patient is nervous/anxious.     PMFS History:  Patient Active Problem List   Diagnosis Date Noted   Pain in left knee 06/04/2021   History of PCOS 02/21/2017   High risk medication use 09/21/2016   Rheumatoid arthritis involving multiple sites with positive rheumatoid factor ([place]) 09/21/2016   History of hypothyroidism 09/21/2016   History of depression 09/21/2016    Past Medical History:  Diagnosis Date   Arthritis    COVID    History of depression    History of hypothyroidism    History of PCOS    Rheumatoid arthritis ([place])  Family History  Problem Relation Age of Onset   Hypertension Mother    Clotting disorder Mother    Lung cancer Paternal Uncle    Cancer Maternal Grandmother        lung   Past Surgical History:  Procedure Laterality Date   BUNIONECTOMY Right    FOOT SURGERY Right 08/24/2021   Social History   Social History Narrative   Left Handed    Lives in a one story home   Drinks Caffeine     Immunization History  Administered Date(s) Administered   PFIZER(Purple Top)SARS-COV-2 Vaccination 10/23/2021     Objective: Vital Signs: BP 127/85 (BP Location: Right Arm, Patient Position: Sitting, Cuff Size: Large)   Pulse 83   Resp 16   Ht 5' 2"$  (1.575 m)   Wt 255 lb 9.6 oz (115.9 kg)   BMI 46.75 kg/m    Physical Exam Vitals and nursing note reviewed.  Constitutional:      Appearance: She is well-developed.  HENT:     Head: Normocephalic and atraumatic.  Eyes:     Conjunctiva/sclera: Conjunctivae normal.  Cardiovascular:     Rate and Rhythm: Normal rate and regular rhythm.     Heart sounds: Normal heart sounds.  Pulmonary:     Effort: Pulmonary effort is normal.     Breath sounds: Normal breath sounds.  Abdominal:     General: Bowel sounds are normal.     Palpations: Abdomen is soft.  Musculoskeletal:     Cervical back: Normal range of motion.  Skin:    General: Skin is warm and dry.     Capillary Refill: Capillary refill takes less than 2 seconds.  Neurological:     Mental Status: She is alert and oriented to person, place, and time.  Psychiatric:        Behavior: Behavior normal.      Musculoskeletal Exam: C-spine has good ROM.  Shoulder joints have painful ROM and stiffness bilaterally.  Elbow joints have good ROM.  Limited ROM of both wrist joints.   Thickening of MCP joints and ulnar deviation.  Tenderness and synovitis of the right 4th MCP.  Tenderness over the 5th MCP.  Incomplete fist formation.  Hip joints have good ROM.  Knee joints have good ROM with some discomfort in the left knee.  Ankle joints have good ROM with no tenderness or joint swelling. Tenderness of the right 1st MTP joint. Overcrowding of toes.    CDAI Exam: CDAI Score: 6.2  Patient Global: 6 mm; Provider Global: 6 mm Swollen: 1 ; Tender: 6  Joint Exam 08/18/2022      Right  Left  Glenohumeral   Tender   Tender  Wrist   Tender     MCP 4  Swollen Tender     MTP 1   Tender     MTP  5   Tender        Investigation: No additional findings.  Imaging: No results found.  Recent Labs: Lab Results  Component Value Date   WBC 6.9 07/22/2022   HGB 12.6 07/22/2022   PLT 334 07/22/2022   NA 138 07/22/2022   K 3.9 07/22/2022   CL 103 07/22/2022   CO2 19 (L) 07/22/2022   GLUCOSE 104 (H) 07/22/2022   BUN 13 07/22/2022   CREATININE 0.69 07/22/2022   BILITOT 0.3 07/22/2022   ALKPHOS 89 07/22/2022   AST 14 07/22/2022   ALT 15 07/22/2022   PROT 7.0 07/22/2022   ALBUMIN 4.3  07/22/2022   CALCIUM 9.3 07/22/2022   GFRAA 134 07/18/2020   QFTBGOLD Negative 08/13/2016   QFTBGOLDPLUS NEGATIVE 10/01/2021    Speciality Comments: PLQ Eye Exam: 01/16/2021 WNL @ [facility] Follow up in 6 months.   Prior therapy: Orencia (inadequate response 9/18-9/19), Enbrel (inadequate response 3/15/5/15), Humira (inadequate response 11/17-3/18), and injectable methotrexate (non-compliance)  Procedures:  No procedures performed Allergies: Keflex [cephalexin]   Schedule eye exam  Switch to sq actemra  Future orders   Assessment / Plan:     Visit Diagnoses: Rheumatoid arthritis involving multiple sites with positive rheumatoid factor ([place]) - Severe, erosive rheumatoid arthritis involving multiple joints: Patient presents today experiencing increased pain in both hands and both feet.  She has been having more frequent flares over the past several months.  She has required 2 courses of prednisone to manage flares.  She remains on Xeljanz 11 mg XR daily, Plaquenil 200 mg 1 tablet by mouth twice daily Monday through Friday, and methotrexate 8 tablets by mouth once weekly.  She is not having to take ibuprofen several days a week to manage her symptoms.  Her quality of life has been greatly suffering to the point that if she is not working she has been sleeping due to severity of pain.  She does not find the current combination of medications to be effective at managing her symptoms.  At her last  office visit on 05/18/2022 there was discussion of possibly switching her to Actemra if her symptoms did not remain controlled on the current combination.  She feels that her symptoms have progressively been worsening over the past several months.  She has been having more good days and bad days and woke up this morning with significant stiffness and discomfort in both hands and both wrists.  She has synovitis over the right fourth MCP and right first MTP joint today.  Discussed that I am apprehensive to make any medication changes since she has already tried Orencia, Enbrel, Humira, injectable methotrexate, and rinvoq.   She would like to try switching from [place] to Actemra as previously discussed.  Indications, contraindications, and potential side effects of Actemra were discussed today in detail.  All questions were addressed and consent was obtained.  We will apply for Actemra through her insurance and once approved she will return to the office for administration of the first injection.  She will be on weekly dosing given she is over 100 kg.  She will remain on Plaquenil and methotrexate as combination therapy.  She was advised to notify us if she continues to have recurrent flares.  She will follow-up in the office in 8 weeks to assess her response.   Medication counseling:  Baseline Immunosuppressant Therapy Labs    Latest Ref Rng & Units 10/01/2021    9:38 AM  Quantiferon TB Gold  Quantiferon TB Gold Plus NEGATIVE NEGATIVE       Latest Ref Rng & Units 07/22/2022    9:51 AM  Serum Protein Electrophoresis  Total Protein 6.0 - 8.5 g/dL 7.0    Lipid Panel Lab Results  Component Value Date   CHOL 148 12/01/2021   HDL 57 12/01/2021   LDLCALC 75 12/01/2021   TRIG 81 12/01/2021   CHOLHDL 2.6 12/01/2021     Chest x-ray: 05/24/22   Counseled patient that Actemra is an IL-6 blocking agent.   Counseled patient on purpose, proper use, and adverse effects of Actemra.  Reviewed the most common  adverse effects including infections, injection  site reaction, bowel injury, and rarely cancer and conditions of the nervous system.  Reviewed that the medication should be held during infections.  Discussed that there is the possibility of an increased risk of malignancy but it is not well understood if this increased risk is due to the medication or the disease state.  Counseled patient that Actemra should be held prior to scheduled surgery.  Counseled patient to avoid live vaccines while on Actemra.  Recommend annual influenza, Pneumovax 23, Prevnar 13, and Shingrix as indicated.   Reviewed the importance of regular labs while on Actemra therapy including the need for routine lipid panel.  Advised patient to get standing labs one month after starting Actemra.  Provided patient with standing lab orders. P rovided patient with medication education material and answered all questions.  Patient voiced understanding.  Patient consented to Actemra.  Will upload consent into the media tab.  Reviewed storage instructions of Actemra with patient.  Advised patient that initial Actemra injection must be given in the office.  Will apply for Actemra through patient's insurance.    Patient dose will be 162 mg every 7 days based on weight >100 kg .  Prescription pending lab results and/or insurance approval.  High risk medication use - Plaquenil 200 mg 1 tablet BID Monday through Friday (restarted 10/2021), Methotrexate 8 tablets by mouth once wkly, and folic acid 2 mg daily.  Applying for Actemra 162 mg sq injections once weekly.  Inadequate response to [place], rinvoq, orencia, humira, enbrel, and injectable MTX (compliance issue). PLQ Eye Exam: 01/16/2021 WNL @ [facility].  Advised to schedule a repeat Plaquenil eye examination.  She was given a Plaquenil eye examination form to take with her to her appointment.  Discussed that we will no longer be able to refill Plaquenil if she does not have an updated Plaquenil  eye exam. TB gold negative on 10/01/21. Future order for TB gold placed today.  CBC and CMP updated on 07/22/22.  She will require lab work 1 month and every 3 months after initiating Actemra. Lipid panel WNL on 12/01/21.  Future order placed today. Discussed the importance of holding methotrexate and Actemra if she develops signs or symptoms of an infection and to resume once the infection has completely cleared.   - Plan: QuantiFERON-TB Gold Plus, Lipid panel  Screening for tuberculosis - Future order for TB gold placed today.  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   Primary osteoarthritis of both knees: She has good range of motion of both knee joints with some discomfort in the left knee.  No warmth or effusion noted.  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 Cannon Kettle.  She underwent right foot reconstructive surgery on 08/24/2021 which was successful.  History of depression: She is currently taking Paxil as prescribed.  Discussed that it may be worth discussing with her PCP switching to Cymbalta.  Discussed that Cymbalta has a crossover for pain relief which may be helpful.  She was in agreement and plans on reaching out to her PCP to further discuss.    Other medical conditions are listed as follows:  History of hypothyroidism  History of PCOS    Orders: Orders Placed This Encounter  Procedures   QuantiFERON-TB Gold Plus   Lipid panel   No orders of the defined types were placed in this encounter.   Follow-Up Instructions: Return in  about 8 weeks (around 10/13/2022) for Rheumatoid arthritis, Osteoarthritis.   Ofilia Neas, PA-C  Note - This record has been created using Dragon software.  Chart creation errors have been sought, but may not always  have been located. Such creation errors do not reflect on  the standard of medical care.

## 2022-08-18 ENCOUNTER — Encounter: Payer: Self-pay | Admitting: Physician Assistant

## 2022-08-18 ENCOUNTER — Telehealth: Payer: Self-pay | Admitting: Pharmacist

## 2022-08-18 ENCOUNTER — Ambulatory Visit: Payer: BC Managed Care – PPO | Attending: Physician Assistant | Admitting: Physician Assistant

## 2022-08-18 VITALS — BP 127/85 | HR 83 | Resp 16 | Ht 62.0 in | Wt 255.6 lb

## 2022-08-18 DIAGNOSIS — M0579 Rheumatoid arthritis with rheumatoid factor of multiple sites without organ or systems involvement: Secondary | ICD-10-CM

## 2022-08-18 DIAGNOSIS — Z79899 Other long term (current) drug therapy: Secondary | ICD-10-CM | POA: Diagnosis not present

## 2022-08-18 DIAGNOSIS — M170 Bilateral primary osteoarthritis of knee: Secondary | ICD-10-CM | POA: Diagnosis not present

## 2022-08-18 DIAGNOSIS — M21961 Unspecified acquired deformity of right lower leg: Secondary | ICD-10-CM

## 2022-08-18 DIAGNOSIS — Z8639 Personal history of other endocrine, nutritional and metabolic disease: Secondary | ICD-10-CM

## 2022-08-18 DIAGNOSIS — Z111 Encounter for screening for respiratory tuberculosis: Secondary | ICD-10-CM

## 2022-08-18 DIAGNOSIS — M21942 Unspecified acquired deformity of hand, left hand: Secondary | ICD-10-CM

## 2022-08-18 DIAGNOSIS — M21941 Unspecified acquired deformity of hand, right hand: Secondary | ICD-10-CM | POA: Diagnosis not present

## 2022-08-18 DIAGNOSIS — Z8742 Personal history of other diseases of the female genital tract: Secondary | ICD-10-CM

## 2022-08-18 DIAGNOSIS — Z8659 Personal history of other mental and behavioral disorders: Secondary | ICD-10-CM

## 2022-08-18 DIAGNOSIS — M069 Rheumatoid arthritis, unspecified: Secondary | ICD-10-CM

## 2022-08-18 DIAGNOSIS — M21962 Unspecified acquired deformity of left lower leg: Secondary | ICD-10-CM

## 2022-08-18 NOTE — Patient Instructions (Signed)
Standing Labs We placed an order today for your standing lab work.   Please have your standing labs drawn in 1 month then every 3 months   Please have your labs drawn 2 weeks prior to your appointment so that the provider can discuss your lab results at your appointment.  Please note that you may see your imaging and lab results in [facility] before we have reviewed them. We will contact you once all results are reviewed. Please allow our office up to 72 hours to thoroughly review all of the results before contacting the office for clarification of your results.  Lab hours are:   Monday through Thursday from 8:00 am -12:30 pm and 1:00 pm-5:00 pm and Friday from 8:00 am-12:00 pm.  Please be advised, all patients with office appointments requiring lab work will take precedent over walk-in lab work.   Labs are drawn by [facility]. Please bring your co-pay at the time of your lab draw.  You may receive a bill from [facility] for your lab work.  Please note if you are on Hydroxychloroquine and and an order has been placed for a Hydroxychloroquine level, you will need to have it drawn 4 hours or more after your last dose.  If you wish to have your labs drawn at another location, please call the office 24 hours in advance so we can fax the orders.  The office is located at [address], [place], [place], [place] [address] No appointment is necessary.    If you have any questions regarding directions or hours of operation,  please call [phone].   As a reminder, please drink plenty of water prior to coming for your lab work. Thanks!   Tocilizumab Injection What is this medication? TOCILIZUMAB (TOE si LIZ ue mab) treats autoimmune conditions, such as arthritis. It works by slowing down an overactive immune system. It may also be used to treat severe COVID-19 in people who are hospitalized. It is a monoclonal antibody. This medicine may be used for other purposes; ask your health care provider or  pharmacist if you have questions. COMMON BRAND NAME(S): Actemra What should I tell my care team before I take this medication? They need to know if you have any of these conditions: Cancer Diabetes Heart disease History of or current hepatitis B infection High blood pressure High cholesterol Immune system problems Infection Liver disease Low blood counts, such as low white cell, platelet, or red cell counts Multiple sclerosis Recent or upcoming vaccine Stomach or intestine problems Stroke An unusual or allergic reaction to tocilizumab, other medications, foods, dyes, or preservatives Pregnant or trying to get pregnant Breast-feeding How should I use this medication? This medication is injected into a vein or under the skin. It may be given by your care team in a hospital or clinic setting. It may also be given at home. If you get this medication at home, you will be taught how to prepare and give it. Use it exactly as directed. Take it as directed on the prescription label. Keep taking it unless your care team tells you to stop. If you use a pen, be sure to take off the outer needle cover before using the dose. It is important that you put your used needles and syringes in a special sharps container. Do not put them in a trash can. If you do not have a sharps container, call your pharmacist or care team to get one. A special MedGuide will be given to you by the  pharmacist with each prescription and refill. Be sure to read this information carefully each time. Talk to your care team about the use of this medication in children. While it may be prescribed for children as young as 2 years for selected conditions, precautions do apply. Overdosage: If you think you have taken too much of this medicine contact a poison control center or emergency room at once. NOTE: This medicine is only for you. Do not share this medicine with others. What if I miss a dose? If you get this medication at the  hospital or clinic: It is important not to miss your dose. Call your care team if you are unable to keep an appointment. If you give yourself this medication at home: If you miss a dose, take it as soon as you can. If it is almost time for your next dose, take only that dose. Do not take double or extra doses. Call your care team with questions. What may interact with this medication? Do not take this medication with any of the following: Live virus vaccines This medication may also interact with the following: Biologic medications, such as abatacept, adalimumab, anakinra, certolizumab, etanercept, golimumab, infliximab, rituximab, secukinumab, ustekinumab Certain medications for cholesterol, such as atorvastatin, lovastatin, simvastatin Cyclosporine Estrogen or progestin hormones Omeprazole Steroid medications, such as prednisone or cortisone Theophylline Vaccines Warfarin This list may not describe all possible interactions. Give your health care provider a list of all the medicines, herbs, non-prescription drugs, or dietary supplements you use. Also tell them if you smoke, drink alcohol, or use illegal drugs. Some items may interact with your medicine. What should I watch for while using this medication? Visit your care team for regular checks on your progress. Your condition will be monitored carefully while you are receiving this medication. Tell your care team if your symptoms do not start to get better or if they get worse. You may need blood work done while taking this medication. You will be tested for tuberculosis (TB) before you start this medication. If your care team prescribes any medication for TB, you should start taking the TB medication before starting this medication. Make sure to finish the full course of TB medication. This medication may increase your risk of getting an infection. Call your care team for advice if you get a fever, chills, sore throat, or other symptoms of a  cold or flu. Do not treat yourself. Try to avoid being around people who are sick. Talk to your care team about your risk of cancer. You may be more at risk for certain types of cancers if you take this medication. What side effects may I notice from receiving this medication? Side effects that you should report to your care team as soon as possible: Allergic reactions--skin rash, itching, hives, swelling of the face, lips, tongue, or throat Infection--fever, chills, cough, sore throat, wounds that don't heal, pain or trouble when passing urine, general feeling of discomfort or being unwell Liver injury--right upper belly pain, loss of appetite, nausea, light-colored stool, dark yellow or brown urine, yellowing skin or eyes, unusual weakness or fatigue Stomach pain that is severe, does not go away, or gets worse Unusual bruising or bleeding Side effects that usually do not require medical attention (report to your care team if they continue or are bothersome): Dizziness Headache Increase in blood pressure Pain, redness, or irritation at injection site Runny or stuffy nose Sore throat Stomach pain This list may not describe all possible side effects. Call  your doctor for medical advice about side effects. You may report side effects to FDA at 1-800-FDA-1088. Where should I keep my medication? Keep out of the reach of children and pets. You will be instructed on how to store this medication. Get rid of any unused medication after the expiration date on the label. To get rid of medications that are no longer needed or have expired: Take the medication to a medication take-back program. Check with your pharmacy or law enforcement to find a location. If you cannot return the medication, ask your pharmacist or care team how to get rid of this medication safely. NOTE: This sheet is a summary. It may not cover all possible information. If you have questions about this medicine, talk to your doctor,  pharmacist, or health care provider.  2023 Elsevier/Gold Standard (2021-09-18 00:00:00)

## 2022-08-18 NOTE — Telephone Encounter (Signed)
Submitted a Prior Authorization request to [facility] for ACTEMRA via CoverMyMeds. Pending clinical questions to populate.  Key: [id number]  Knox Saliva, PharmD, MPH, BCPS, CPP Clinical Pharmacist (Rheumatology and Pulmonology)

## 2022-08-18 NOTE — Telephone Encounter (Signed)
Clinical questions completed for Actemra PA  Knox Saliva, PharmD, MPH, BCPS, CPP Clinical Pharmacist (Rheumatology and Pulmonology)

## 2022-08-18 NOTE — Telephone Encounter (Signed)
Please start Actemra SQ BIV.  Dose: 154m every 7 days (based on weight > 100kg)  Dx: RA  DKnox Saliva PharmD, MPH, BCPS, CPP Clinical Pharmacist (Rheumatology and Pulmonology)

## 2022-08-18 NOTE — Progress Notes (Signed)
Pharmacy Note Subjective: Patient presents today to the [facility] for follow up office visit. Patient seen by the pharmacist for counseling on Actemra for rheumatoid arthritis.  History of hyperlipidemia: No  History of diverticulitis: No  Objective: CBC    Component Value Date/Time   WBC 6.9 07/22/2022 0951   WBC 8.3 05/24/2022 0941   RBC 4.05 07/22/2022 0951   RBC 4.12 05/24/2022 0941   HGB 12.6 07/22/2022 0951   HCT 37.1 07/22/2022 0951   PLT 334 07/22/2022 0951   MCV 92 07/22/2022 0951   MCH 31.1 07/22/2022 0951   MCH 31.3 05/24/2022 0941   MCHC 34.0 07/22/2022 0951   MCHC 33.8 05/24/2022 0941   RDW 13.2 07/22/2022 0951   LYMPHSABS 0.9 07/22/2022 0951   MONOABS 62 (L) 12/22/2016 1349   EOSABS 0.0 07/22/2022 0951   BASOSABS 0.0 07/22/2022 0951    CMP     Component Value Date/Time   NA 138 07/22/2022 0951   K 3.9 07/22/2022 0951   CL 103 07/22/2022 0951   CO2 19 (L) 07/22/2022 0951   GLUCOSE 104 (H) 07/22/2022 0951   GLUCOSE 91 05/24/2022 0941   BUN 13 07/22/2022 0951   CREATININE 0.69 07/22/2022 0951   CREATININE 0.78 05/28/2021 1416   CALCIUM 9.3 07/22/2022 0951   PROT 7.0 07/22/2022 0951   ALBUMIN 4.3 07/22/2022 0951   AST 14 07/22/2022 0951   ALT 15 07/22/2022 0951   ALKPHOS 89 07/22/2022 0951   BILITOT 0.3 07/22/2022 0951   GFRNONAA >60 05/24/2022 0941   GFRNONAA 121 09/19/2019 0906   GFRAA 134 07/18/2020 1144   GFRAA 140 09/19/2019 0906     Baseline Immunosuppressant Therapy Labs     Latest Ref Rng & Units 10/01/2021    9:38 AM  Quantiferon TB Gold  Quantiferon TB Gold Plus NEGATIVE NEGATIVE        No results found for: "HIV"        Latest Ref Rng & Units 07/22/2022    9:51 AM  Serum Protein Electrophoresis  Total Protein 6.0 - 8.5 g/dL 7.0     No results found for: "G6PDH"  No results found for: "TPMT"   Lipid Panel Lab Results  Component Value Date   CHOL 148 12/01/2021   HDL 57 12/01/2021   LDLCALC 75  12/01/2021   TRIG 81 12/01/2021   CHOLHDL 2.6 12/01/2021     Chest x-ray (05/24/2022): Mild bronchial wall thickening may suggest bronchitis. No focal airspace consolidation.  Assessment/Plan:  Counseled patient that Actemra is an IL-6 blocking agent.  Counseled patient on purpose, proper use, and adverse effects of Actemra.  Reviewed the most common adverse effects including infections, infusion reactions, bowel injury, and rarely cancer and conditions of the nervous system.  Reviewed risk of lipid abnormalities and elevated liver enzymes. Discussed that there is the possibility of an increased risk of malignancy but it is not well understood if this increased risk is due to the medication or the disease state. Counseled patient that Actemra should be held prior to scheduled surgery for infections or new antibiotic use. Counseled patient that Actemra should be held prior to scheduled surgery. Recommend annual influenza, PCV 15 or PCV20 or Pneumovax 23, and Shingrix as indicated.  Reviewed the importance of regular labs while on Actemra therapy including the need for routine lipid panel.  Will recheck lipid panel after 4-8 weeks of starting Actemra, then every 6 months routinely. CBC and CMP will be monitored every 3 months.  TB gold will be monitored annually. Standing orders placed.  Provided patient with medication education material and answered all questions.  Patient voiced understanding.  Patient consented to Actemra.  Will upload consent into the media tab.  Reviewed storage instructions of Actemra with patient.  Advised patient that initial Actemra injection must be given in the office.  Will apply for Actemra through patient's insurance for self-administration using auto-injector/prefilled syringe.  Patient dose will be 162 mg every 7 days based on weight >100 kg .  Prescription pending lab results and/or insurance approval.   Maryan Puls, PharmD PGY-1 [facility] Resident

## 2022-08-24 ENCOUNTER — Telehealth: Payer: Self-pay | Admitting: Pharmacist

## 2022-08-24 NOTE — Telephone Encounter (Signed)
Received a fax regarding Prior Authorization from [employer] for [place]. Authorization has been DENIED because Actemra is not preferred option. Preferred self-administered drugs are: Pamala Hurry, Cimzia, Enbrel, Humira, Hyrimoz, [place], [place], Rinvoq, [name].  I spoke with patient. She is okay with trial of Kevzara. Will provide in depth counseling and get written consent at new start visit.  Knox Saliva, PharmD, MPH, BCPS, CPP Clinical Pharmacist (Rheumatology and Pulmonology)

## 2022-08-24 NOTE — Telephone Encounter (Signed)
Please started Kevzara BIV  Dose: '200mg'$  SQ every 14 days  She will need to be enrolled into copay card once approved  Will need to get consent at new start visit  Knox Saliva, PharmD, MPH, BCPS, CPP Clinical Pharmacist (Rheumatology and Pulmonology)

## 2022-08-25 NOTE — Telephone Encounter (Signed)
Submitted a Prior Authorization request to [facility] for [facility] via CoverMyMeds. Will update once we receive a response.  Key: [id number]

## 2022-08-27 NOTE — Telephone Encounter (Signed)
Received notification from [facility] regarding a prior authorization for [facility]. Authorization has been APPROVED from 08/25/22 to 08/26/23. Approval letter sent to scan center.  Patient must fill through [facility]: [phone]  Authorization # [id number]  Attempted to enroll patient into Kevzara copay card but response states: We can't offer you a KevzaraConnect Copay Card based on the information you provided. If you think this is in error, please call us at [phone]). Called copay card company to enroll patient:  ID: [id number] BIN: [id number] Group: [id number] PCN: [id number] or LOYALTY  Patient can be scheduled for Kevzara new start (as long as we have samples)  Knox Saliva, PharmD, MPH, BCPS, CPP Clinical Pharmacist (Rheumatology and Pulmonology)

## 2022-08-30 NOTE — Telephone Encounter (Signed)
Patient scheduled for Shannon David new start on 09/02/2022. She has been advised that her dose of [name] should be take on Wednesday, 09/01/22.  Knox Saliva, PharmD, MPH, BCPS, CPP Clinical Pharmacist (Rheumatology and Pulmonology)

## 2022-09-02 ENCOUNTER — Ambulatory Visit: Payer: BC Managed Care – PPO | Attending: Rheumatology | Admitting: Pharmacist

## 2022-09-02 DIAGNOSIS — Z79899 Other long term (current) drug therapy: Secondary | ICD-10-CM

## 2022-09-02 DIAGNOSIS — M0579 Rheumatoid arthritis with rheumatoid factor of multiple sites without organ or systems involvement: Secondary | ICD-10-CM

## 2022-09-02 DIAGNOSIS — Z7189 Other specified counseling: Secondary | ICD-10-CM

## 2022-09-02 MED ORDER — HYDROXYCHLOROQUINE SULFATE 200 MG PO TABS: ORAL_TABLET | ORAL | 2 refills | Status: DC

## 2022-09-02 MED ORDER — METHOTREXATE SODIUM 2.5 MG PO TABS: ORAL_TABLET | ORAL | 0 refills | Status: DC

## 2022-09-02 MED ORDER — KEVZARA 200 MG/1.14ML ~~LOC~~ SOAJ: 200.0000 mg | SUBCUTANEOUS | 0 refills | Status: DC

## 2022-09-02 MED ORDER — FOLIC ACID 1 MG PO TABS: 2.0000 mg | ORAL_TABLET | Freq: Every day | ORAL | 3 refills | Status: DC

## 2022-09-02 NOTE — Patient Instructions (Signed)
Your next [facility] dose is due on 09/16/22, 09/30/22, and every 14 days thereafter  CONTINUE hydroxychloroquine '200mg'$  twice daily Mondays through Fridays only. CONTINUE methotrexate '20mg'$  once weekly with folic acid '2mg'$  daily STOP Xeljanz  HOLD KEVZARA and METHOTREXATE if you have signs or symptoms of an infection. You can resume once you feel better or back to your baseline. HOLD KEVZARA and METHOTREXATE if you start antibiotics to treat an infection. HOLD KEVZARA and METHOTREXATE around the time of surgery/procedures. Your surgeon will be able to provide recommendations on when to hold BEFORE and when you are cleared to [place].  Pharmacy information: Your prescription [facility] shipped from [facility]. Their phone number is [phone] Please call to schedule shipment and confirm address. They will mail your medication to your home.  Cost information: Your copay should be affordable. If you call the pharmacy and it is not affordable, please double-check that they are billing through your copay card as secondary coverage. That copay card information is: ID: [id number] BIN: [id number] Group: [id number] PCN: [id number] or LOYALTY  Labs are due in 1 month then every 3 months. Lab hours are from Monday to Thursday 8am-12:30pm and 1pm-5pm and Friday 8am-12pm. You do not need an appointment if you come for labs during these times.  How to manage an injection site reaction: Remember the 5 C's: COUNTER - leave on the counter at least 30 minutes but up to overnight to bring medication to room temperature. This may help prevent stinging COLD - place something cold (like an ice gel pack or cold water bottle) on the injection site just before cleansing with alcohol. This may help reduce pain CLARITIN - use Claritin (generic name is loratadine) for the first two weeks of treatment or the day of, the day before, and the day after injecting. This will help to minimize injection site reactions CORTISONE  CREAM - apply if injection site is irritated and itching CALL ME - if injection site reaction is bigger than the size of your fist, looks infected, blisters, or if you develop hives

## 2022-09-02 NOTE — Progress Notes (Signed)
Pharmacy Note  Subjective:   Patient presents to clinic today to receive first dose of Kevzara for rheumatoid arthritis. Patient currently takes hydroxychloroquine '200mg'$  twice daily Mondays through Fridays and methotrexate '20mg'$  once weekly with folic acid '2mg'$  daily.  Her last dose of [name] was yesterday  Patient running a fever or have signs/symptoms of infection? No  Patient currently on antibiotics for the treatment of infection? No  Patient have any upcoming invasive procedures/surgeries? No  Objective: CMP     Component Value Date/Time   NA 138 07/22/2022 0951   K 3.9 07/22/2022 0951   CL 103 07/22/2022 0951   CO2 19 (L) 07/22/2022 0951   GLUCOSE 104 (H) 07/22/2022 0951   GLUCOSE 91 05/24/2022 0941   BUN 13 07/22/2022 0951   CREATININE 0.69 07/22/2022 0951   CREATININE 0.78 05/28/2021 1416   CALCIUM 9.3 07/22/2022 0951   PROT 7.0 07/22/2022 0951   ALBUMIN 4.3 07/22/2022 0951   AST 14 07/22/2022 0951   ALT 15 07/22/2022 0951   ALKPHOS 89 07/22/2022 0951   BILITOT 0.3 07/22/2022 0951   GFRNONAA >60 05/24/2022 0941   GFRNONAA 121 09/19/2019 0906   GFRAA 134 07/18/2020 1144   GFRAA 140 09/19/2019 0906    CBC    Component Value Date/Time   WBC 6.9 07/22/2022 0951   WBC 8.3 05/24/2022 0941   RBC 4.05 07/22/2022 0951   RBC 4.12 05/24/2022 0941   HGB 12.6 07/22/2022 0951   HCT 37.1 07/22/2022 0951   PLT 334 07/22/2022 0951   MCV 92 07/22/2022 0951   MCH 31.1 07/22/2022 0951   MCH 31.3 05/24/2022 0941   MCHC 34.0 07/22/2022 0951   MCHC 33.8 05/24/2022 0941   RDW 13.2 07/22/2022 0951   LYMPHSABS 0.9 07/22/2022 0951   MONOABS 62 (L) 12/22/2016 1349   EOSABS 0.0 07/22/2022 0951   BASOSABS 0.0 07/22/2022 0951    Baseline Immunosuppressant Therapy Labs TB GOLD    Latest Ref Rng & Units 10/01/2021    9:38 AM  Quantiferon TB Gold  Quantiferon TB Gold Plus NEGATIVE NEGATIVE    Hepatitis panel: negative 02/22/13 HIV: negative 02/22/13  Immunoglobulins: within  normal limits 02/22/13 SPEP    Latest Ref Rng & Units 07/22/2022    9:51 AM  Serum Protein Electrophoresis  Total Protein 6.0 - 8.5 g/dL 7.0    Chest x-ray: 05/24/2022 - Mild bronchial wall thickening may suggest bronchitis. No focal airspace consolidation.  Assessment/Plan:  Counseled patient that Darcus Pester is an IL-6 blocking agent.  Counseled patient on purpose, proper use, and adverse effects of Kevzara.  Reviewed the most common adverse effects including infections, infusion reactions, bowel injury, and rarely cancer and conditions of the nervous system.  Reviewed risk of lipid abnormalities and elevated liver enzymes. Discussed that there is the possibility of an increased risk of malignancy but it is not well understood if this increased risk is due to the medication or the disease state. Counseled patient that Darcus Pester should be held prior to scheduled surgery for infections or new antibiotic use. Counseled patient that Darcus Pester should be held prior to scheduled surgery. Recommend annual influenza, PCV 15 or PCV20 or Pneumovax 23, and Shingrix as indicated.  Reviewed the importance of regular labs while on Kevzara therapy including the need for routine lipid panel.    Provided patient with medication education material and answered all questions.  Patient voiced understanding.  Patient consented to [facility].  Will upload consent into the media tab.  Reviewed storage instructions  of Kevzara with patient.   Reviewed importance of holding Kevzara with signs/symptoms of an infections, if antibiotics are prescribed to treat an active infection, and with invasive procedures  Demonstrated proper injection technique with Kevzara pen demo device  Patient able to demonstrate proper injection technique using the teach back method.  Patient self injected in the left upper thigh with:  Sample Medication: Kevzara pen '200mg'$ /1.14 mL NDC: [id number] Lot: [id number] Expiration: 10/26/2023  Patient tolerated  well.  Observed for 30 mins in office for adverse reaction and none noted. Patient denies itchiness and irritation   Patient is to return in 1 month for labs and 6-8 weeks for follow-up appointment.  Standing orders for CBC/CMP and lipid placed.  TB gold will be monitored yearly. Lipid panel will be monitored at 4-8 weeks after initiating and every 6-9 months thereafter.  KEVZARA approved through insurance .   [facility]: [phone].  Patient provided with pharmacy phone number and advised to call later this week to schedule shipment to home.  Patient will continue Kevzara '200mg'$  subcut every 14 days in combination with hydroxychloroquine '200mg'$  twice daily Mondays through Fridays and methotrexate '20mg'$  once weekly with folic acid '2mg'$  daily. Refills have been sent to pharmacy today for all DMARDs.  All questions encouraged and answered.  Instructed patient to call with any further questions or concerns.  Knox Saliva, PharmD, MPH, BCPS, CPP Clinical Pharmacist (Rheumatology and Pulmonology)  09/02/2022 8:07 AM

## 2022-09-08 MED ORDER — PREDNISONE 5 MG PO TABS: ORAL_TABLET | ORAL | 0 refills | Status: DC

## 2022-09-08 NOTE — Telephone Encounter (Signed)
Ok to send in prednisone starting at 20 mg tapering by 5 mg every 7 days.  Take in the morning with food and avoid the use of NSAIDs.

## 2022-09-14 NOTE — Telephone Encounter (Signed)
Please review.  We can further discuss tomorrow.

## 2022-10-04 NOTE — Progress Notes (Deleted)
Office Visit Note  Patient: Shannon ShinesHeather David             Date of Birth: Jul 22, 1990           MRN: [id number]             PCP: Lars Mageetter, Devin B, NP Referring: Lars Mageetter, Devin B, NP Visit Date: 10/18/2022 Occupation: @GUAROCC @  Subjective:  Medication monitoring   History of Present Illness: Shannon David is a 32 y.o. female with history of seropositive rheumatoid arthritis and osteoarthritis.  Patient remains on Kevzara, Plaquenil 200 mg 1 tablet BID Monday through Friday (restarted 10/2021), Methotrexate 8 tablets by mouth once wkly, and folic acid 2 mg daily.     Activities of Daily Living:  Patient reports morning stiffness for *** {minute/hour:19697}.   Patient {ACTIONS;DENIES/REPORTS:21021675::"Denies"} nocturnal pain.  Difficulty dressing/grooming: {ACTIONS;DENIES/REPORTS:21021675::"Denies"} Difficulty climbing stairs: {ACTIONS;DENIES/REPORTS:21021675::"Denies"} Difficulty getting out of chair: {ACTIONS;DENIES/REPORTS:21021675::"Denies"} Difficulty using hands for taps, buttons, cutlery, and/or writing: {ACTIONS;DENIES/REPORTS:21021675::"Denies"}  No Rheumatology ROS completed.   PMFS History:  Patient Active Problem List   Diagnosis Date Noted  . Pain in left knee 06/04/2021  . History of PCOS 02/21/2017  . High risk medication use 09/21/2016  . Rheumatoid arthritis involving multiple sites with positive rheumatoid factor 09/21/2016  . History of hypothyroidism 09/21/2016  . History of depression 09/21/2016    Past Medical History:  Diagnosis Date  . Arthritis   . COVID   . History of depression   . History of hypothyroidism   . History of PCOS   . Rheumatoid arthritis (HCC)     Family History  Problem Relation Age of Onset  . Hypertension Mother   . Clotting disorder Mother   . Lung cancer Paternal Uncle   . Cancer Maternal Grandmother        lung   Past Surgical History:  Procedure Laterality Date  . BUNIONECTOMY Right   . FOOT SURGERY Right 08/24/2021    Social History   Social History Narrative   Left Handed    Lives in a one story home   Drinks Caffeine    Immunization History  Administered Date(s) Administered  . PFIZER(Purple Top)SARS-COV-2 Vaccination 10/23/2021     Objective: Vital Signs: There were no vitals taken for this visit.   Physical Exam Vitals and nursing note reviewed.  Constitutional:      Appearance: She is well-developed.  HENT:     Head: Normocephalic and atraumatic.  Eyes:     Conjunctiva/sclera: Conjunctivae normal.  Cardiovascular:     Rate and Rhythm: Normal rate and regular rhythm.     Heart sounds: Normal heart sounds.  Pulmonary:     Effort: Pulmonary effort is normal.     Breath sounds: Normal breath sounds.  Abdominal:     General: Bowel sounds are normal.     Palpations: Abdomen is soft.  Musculoskeletal:     Cervical back: Normal range of motion.  Lymphadenopathy:     Cervical: No cervical adenopathy.  Skin:    General: Skin is warm and dry.     Capillary Refill: Capillary refill takes less than 2 seconds.  Neurological:     Mental Status: She is alert and oriented to person, place, and time.  Psychiatric:        Behavior: Behavior normal.     Musculoskeletal Exam: ***  CDAI Exam: CDAI Score: -- Patient Global: --; Provider Global: -- Swollen: --; Tender: -- Joint Exam 10/18/2022   No joint exam has been documented for this  visit   There is currently no information documented on the homunculus. Go to the Rheumatology activity and complete the homunculus joint exam.  Investigation: No additional findings.  Imaging: No results found.  Recent Labs: Lab Results  Component Value Date   WBC 6.9 07/22/2022   HGB 12.6 07/22/2022   PLT 334 07/22/2022   NA 138 07/22/2022   K 3.9 07/22/2022   CL 103 07/22/2022   CO2 19 (L) 07/22/2022   GLUCOSE 104 (H) 07/22/2022   BUN 13 07/22/2022   CREATININE 0.69 07/22/2022   BILITOT 0.3 07/22/2022   ALKPHOS 89 07/22/2022   AST  14 07/22/2022   ALT 15 07/22/2022   PROT 7.0 07/22/2022   ALBUMIN 4.3 07/22/2022   CALCIUM 9.3 07/22/2022   GFRAA 134 07/18/2020   QFTBGOLD Negative 08/13/2016   QFTBGOLDPLUS NEGATIVE 10/01/2021    Speciality Comments: PLQ Eye Exam: 01/16/2021 WNL @ [facility] Follow up in 6 months.   Prior therapy: Orencia (inadequate response 9/18-9/19), Enbrel (inadequate response 3/15/5/15), Humira (inadequate response 11/17-3/18), and injectable methotrexate (non-compliance)  Procedures:  No procedures performed Allergies: Keflex [cephalexin]   Assessment / Plan:     Visit Diagnoses: No diagnosis found.  Orders: No orders of the defined types were placed in this encounter.  No orders of the defined types were placed in this encounter.   Face-to-face time spent with patient was *** minutes. Greater than 50% of time was spent in counseling and coordination of care.  Follow-Up Instructions: No follow-ups on file.   Ellen Henri, CMA  Note - This record has been created using Animal nutritionist.  Chart creation errors have been sought, but may not always  have been located. Such creation errors do not reflect on  the standard of medical care.

## 2022-10-18 ENCOUNTER — Ambulatory Visit: Payer: BC Managed Care – PPO | Admitting: Physician Assistant

## 2022-10-18 ENCOUNTER — Encounter: Payer: Self-pay | Admitting: Physician Assistant

## 2022-10-18 ENCOUNTER — Other Ambulatory Visit: Payer: Self-pay | Admitting: Pharmacist

## 2022-10-18 ENCOUNTER — Ambulatory Visit: Payer: BC Managed Care – PPO | Attending: Physician Assistant | Admitting: Physician Assistant

## 2022-10-18 VITALS — BP 135/90 | HR 79 | Resp 16 | Ht 62.0 in | Wt 258.8 lb

## 2022-10-18 DIAGNOSIS — M069 Rheumatoid arthritis, unspecified: Secondary | ICD-10-CM

## 2022-10-18 DIAGNOSIS — Z13220 Encounter for screening for lipoid disorders: Secondary | ICD-10-CM

## 2022-10-18 DIAGNOSIS — M21941 Unspecified acquired deformity of hand, right hand: Secondary | ICD-10-CM

## 2022-10-18 DIAGNOSIS — M0579 Rheumatoid arthritis with rheumatoid factor of multiple sites without organ or systems involvement: Secondary | ICD-10-CM

## 2022-10-18 DIAGNOSIS — M65341 Trigger finger, right ring finger: Secondary | ICD-10-CM | POA: Diagnosis not present

## 2022-10-18 DIAGNOSIS — Z79899 Other long term (current) drug therapy: Secondary | ICD-10-CM

## 2022-10-18 DIAGNOSIS — Z8639 Personal history of other endocrine, nutritional and metabolic disease: Secondary | ICD-10-CM

## 2022-10-18 DIAGNOSIS — M21961 Unspecified acquired deformity of right lower leg: Secondary | ICD-10-CM

## 2022-10-18 DIAGNOSIS — Z8701 Personal history of pneumonia (recurrent): Secondary | ICD-10-CM

## 2022-10-18 DIAGNOSIS — M170 Bilateral primary osteoarthritis of knee: Secondary | ICD-10-CM

## 2022-10-18 DIAGNOSIS — Z8742 Personal history of other diseases of the female genital tract: Secondary | ICD-10-CM

## 2022-10-18 DIAGNOSIS — Z8659 Personal history of other mental and behavioral disorders: Secondary | ICD-10-CM

## 2022-10-18 DIAGNOSIS — M21962 Unspecified acquired deformity of left lower leg: Secondary | ICD-10-CM

## 2022-10-18 DIAGNOSIS — M21942 Unspecified acquired deformity of hand, left hand: Secondary | ICD-10-CM

## 2022-10-18 DIAGNOSIS — Z111 Encounter for screening for respiratory tuberculosis: Secondary | ICD-10-CM

## 2022-10-18 LAB — CBC WITH DIFFERENTIAL/PLATELET
HCT: 37.9 % (ref 35.0–45.0)
Lymphs Abs: 1079 cells/uL (ref 850–3900)
Neutro Abs: 996 cells/uL — ABNORMAL LOW (ref 1500–7800)
Total Lymphocyte: 41.5 %

## 2022-10-18 NOTE — Patient Instructions (Signed)
We will place referral to [employer] center in [place]. They will be in touch with you once approved and ready for scheduling  Tocilizumab Injection What is this medication? TOCILIZUMAB (TOE si LIZ ue mab) treats autoimmune conditions, such as arthritis. It works by slowing down an overactive immune system. It may also be used to treat severe COVID-19 in people who are hospitalized. It is a monoclonal antibody. This medicine may be used for other purposes; ask your health care provider or pharmacist if you have questions. COMMON BRAND NAME(S): Actemra What should I tell my care team before I take this medication? They need to know if you have any of these conditions: Cancer Diabetes Heart disease History of or current hepatitis B infection High blood pressure High cholesterol Immune system problems Infection Liver disease Low blood counts, such as low white cell, platelet, or red cell counts Multiple sclerosis Recent or upcoming vaccine Stomach or intestine problems Stroke An unusual or allergic reaction to tocilizumab, other medications, foods, dyes, or preservatives Pregnant or trying to get pregnant Breast-feeding How should I use this medication? This medication is injected into a vein or under the skin. It may be given by your care team in a hospital or clinic setting. It may also be given at home. If you get this medication at home, you will be taught how to prepare and give it. Use it exactly as directed. Take it as directed on the prescription label. Keep taking it unless your care team tells you to stop. If you use a pen, be sure to take off the outer needle cover before using the dose. It is important that you put your used needles and syringes in a special sharps container. Do not put them in a trash can. If you do not have a sharps container, call your pharmacist or care team to get one. A special MedGuide will be given to you by the pharmacist with each  prescription and refill. Be sure to read this information carefully each time. Talk to your care team about the use of this medication in children. While it may be prescribed for children as young as 2 years for selected conditions, precautions do apply. Overdosage: If you think you have taken too much of this medicine contact a poison control center or emergency room at once. NOTE: This medicine is only for you. Do not share this medicine with others. What if I miss a dose? If you get this medication at the hospital or clinic: It is important not to miss your dose. Call your care team if you are unable to keep an appointment. If you give yourself this medication at home: If you miss a dose, take it as soon as you can. If it is almost time for your next dose, take only that dose. Do not take double or extra doses. Call your care team with questions. What may interact with this medication? Do not take this medication with any of the following: Live virus vaccines This medication may also interact with the following: Biologic medications, such as abatacept, adalimumab, anakinra, certolizumab, etanercept, golimumab, infliximab, rituximab, secukinumab, ustekinumab Certain medications for cholesterol, such as atorvastatin, lovastatin, simvastatin Cyclosporine Estrogen or progestin hormones Omeprazole Steroid medications, such as prednisone or cortisone Theophylline Vaccines Warfarin This list may not describe all possible interactions. Give your health care provider a list of all the medicines, herbs, non-prescription drugs, or dietary supplements you use. Also tell them if you smoke, drink alcohol, or use  illegal drugs. Some items may interact with your medicine. What should I watch for while using this medication? Visit your care team for regular checks on your progress. Your condition will be monitored carefully while you are receiving this medication. Tell your care team if your symptoms do not  start to get better or if they get worse. You may need blood work done while taking this medication. You will be tested for tuberculosis (TB) before you start this medication. If your care team prescribes any medication for TB, you should start taking the TB medication before starting this medication. Make sure to finish the full course of TB medication. This medication may increase your risk of getting an infection. Call your care team for advice if you get a fever, chills, sore throat, or other symptoms of a cold or flu. Do not treat yourself. Try to avoid being around people who are sick. Talk to your care team about your risk of cancer. You may be more at risk for certain types of cancers if you take this medication. What side effects may I notice from receiving this medication? Side effects that you should report to your care team as soon as possible: Allergic reactions--skin rash, itching, hives, swelling of the face, lips, tongue, or throat Infection--fever, chills, cough, sore throat, wounds that don't heal, pain or trouble when passing urine, general feeling of discomfort or being unwell Liver injury--right upper belly pain, loss of appetite, nausea, light-colored stool, dark yellow or brown urine, yellowing skin or eyes, unusual weakness or fatigue Stomach pain that is severe, does not go away, or gets worse Unusual bruising or bleeding Side effects that usually do not require medical attention (report to your care team if they continue or are bothersome): Dizziness Headache Increase in blood pressure Pain, redness, or irritation at injection site Runny or stuffy nose Sore throat Stomach pain This list may not describe all possible side effects. Call your doctor for medical advice about side effects. You may report side effects to FDA at 1-800-FDA-1088. Where should I keep my medication? Keep out of the reach of children and pets. You will be instructed on how to store this  medication. Get rid of any unused medication after the expiration date on the label. To get rid of medications that are no longer needed or have expired: Take the medication to a medication take-back program. Check with your pharmacy or law enforcement to find a location. If you cannot return the medication, ask your pharmacist or care team how to get rid of this medication safely. NOTE: This sheet is a summary. It may not cover all possible information. If you have questions about this medicine, talk to your doctor, pharmacist, or health care provider.  2023 Elsevier/Gold Standard (2021-09-18 00:00:00)

## 2022-10-18 NOTE — Progress Notes (Signed)
Pharmacy Note Subjective: Patient presents today to the [facility] for follow up office visit. Patient seen by the pharmacist for counseling on Actemra for rheumatoid arthritis. Also takes hydroxychloroquine  twice daily Monday through Friday and MTX  po once weekly (w folic acid  daily)  History of hyperlipidemia: No  History of diverticulitis: No  Objective:  CBC    Component Value Date/Time   WBC 6.9 07/22/2022 0951   WBC 8.3 05/24/2022 0941   RBC 4.05 07/22/2022 0951   RBC 4.12 05/24/2022 0941   HGB 12.6 07/22/2022 0951   HCT 37.1 07/22/2022 0951   PLT 334 07/22/2022 0951   MCV 92 07/22/2022 0951   MCH 31.1 07/22/2022 0951   MCH 31.3 05/24/2022 0941   MCHC 34.0 07/22/2022 0951   MCHC 33.8 05/24/2022 0941   RDW 13.2 07/22/2022 0951   LYMPHSABS 0.9 07/22/2022 0951   MONOABS 62 (L) 12/22/2016 1349   EOSABS 0.0 07/22/2022 0951   BASOSABS 0.0 07/22/2022 0951    CMP     Component Value Date/Time   NA 138 07/22/2022 0951   K 3.9 07/22/2022 0951   CL 103 07/22/2022 0951   CO2 19 (L) 07/22/2022 0951   GLUCOSE 104 (H) 07/22/2022 0951   GLUCOSE 91 05/24/2022 0941   BUN 13 07/22/2022 0951   CREATININE 0.69 07/22/2022 0951   CREATININE 0.78 05/28/2021 1416   CALCIUM 9.3 07/22/2022 0951   PROT 7.0 07/22/2022 0951   ALBUMIN 4.3 07/22/2022 0951   AST 14 07/22/2022 0951   ALT 15 07/22/2022 0951   ALKPHOS 89 07/22/2022 0951   BILITOT 0.3 07/22/2022 0951   GFRNONAA >60 05/24/2022 0941   GFRNONAA 121 09/19/2019 0906   GFRAA 134 07/18/2020 1144   GFRAA 140 09/19/2019 0906     Baseline Immunosuppressant Therapy Labs     Latest Ref Rng & Units 10/01/2021    9:38 AM  Quantiferon TB Gold  Quantiferon TB Gold Plus NEGATIVE NEGATIVE        No results found for: "HIV"        Latest Ref Rng & Units 07/22/2022    9:51 AM  Serum Protein Electrophoresis  Total Protein 6.0 - 8.5 g/dL 7.0     No results found for: "G6PDH"  No results found  for: "TPMT"   Lipid panel Lab Results  Component Value Date   CHOL 148 12/01/2021   HDL 57 12/01/2021   LDLCALC 75 12/01/2021   TRIG 81 12/01/2021   CHOLHDL 2.6 12/01/2021    Chest x-ray: 05/24/22 - Mild bronchial wall thickening may suggest bronchitis. No focal airspace consolidation.  Assessment/Plan:  Counseled patient that Actemra is an IL-6 blocking agent.  Counseled patient on purpose, proper use, and adverse effects of Actemra.  Reviewed the most common adverse effects including infections, infusion reactions, bowel injury, and rarely cancer and conditions of the nervous system.  Reviewed risk of lipid abnormalities and elevated liver enzymes. Discussed that there is the possibility of an increased risk of malignancy but it is not well understood if this increased risk is due to the medication or the disease state. Counseled patient that Actemra should be held prior to scheduled surgery for infections or new antibiotic use. Counseled patient that Actemra should be held prior to scheduled surgery. Recommend annual influenza, PCV 15 or PCV20 or Pneumovax 23, and Shingrix as indicated.  Reviewed the importance of regular labs while on Actemra therapy including the need for routine lipid panel.  Will recheck lipid  panel after 4-8 weeks of starting Actemra, then every 6 months routinely. CBC and CMP will be monitored every 3 months. TB gold will be monitored annually. Standing orders placed.  Provided patient with medication education material and answered all questions.  Patient voiced understanding.  Patient consented to Actemra.  Will upload consent into the media tab.    Actemra dose will be /kg every 28 days (for rheumatoid arthritis)  Her last Kevzara dose was on Thursday, 10/14/22. She will be able to start Actemra infusions on or after 10/28/22  Referral placed to [facility]  Will start benefits investigation for Actemra.

## 2022-10-18 NOTE — Progress Notes (Signed)
Office Visit Note  Patient: Shannon David             Date of Birth: 15-Mar-1991           MRN: [id number]             PCP: Lars Mage, NP Referring: Lars Mage, NP Visit Date: 10/18/2022 Occupation: @  Subjective:  Discuss medication options   History of Present Illness: Shannon David is a 32 y.o. female with history of seropositive rheumatoid arthritis and osteoarthritis.  Patient is taking on kevzara 200 mg sq injections every 14 days, methotrexate 8 tablets once weekly and plaquenil 200 mg 1 tablet by mouth twice daily Monday through Friday.  Patient was started on Kevzara on 09/02/2022.  She has been experiencing injection site reactions every 2 weeks with each Kevzara dose.  She would like to discuss other treatment options.  She has not noticed any clinical improvement since initiating Kevzara.  She has been having severe consistent pain in the right fifth MCP joint.  The discomfort has been severe and has been interfering with work.  She also has ongoing pain in both knees and both feet.  She has been tolerating methotrexate and Plaquenil without any side effects.  She is due to update her Plaquenil eye examination today.  She denies any recent or recurrent infections.   Activities of Daily Living:  Patient reports morning stiffness for 2-3 hours.   Patient Reports nocturnal pain.  Difficulty dressing/grooming: Denies Difficulty climbing stairs: Reports Difficulty getting out of chair: Reports Difficulty using hands for taps, buttons, cutlery, and/or writing: Reports  Review of Systems  Constitutional:  Positive for fatigue.  HENT:  Negative for mouth sores and mouth dryness.   Eyes:  Negative for dryness.  Respiratory:  Negative for shortness of breath.   Cardiovascular:  Negative for chest pain and palpitations.  Gastrointestinal:  Negative for blood in stool, constipation and diarrhea.  Endocrine: Negative for increased urination.  Genitourinary:   Negative for involuntary urination.  Musculoskeletal:  Positive for joint pain, gait problem, joint pain and morning stiffness. Negative for joint swelling, myalgias, muscle weakness, muscle tenderness and myalgias.  Skin:  Positive for rash and sensitivity to sunlight. Negative for color change and hair loss.  Allergic/Immunologic: Positive for susceptible to infections.  Neurological:  Negative for dizziness and headaches.  Hematological:  Negative for swollen glands.  Psychiatric/Behavioral:  Positive for depressed mood. Negative for sleep disturbance. The patient is nervous/anxious.     PMFS History:  Patient Active Problem List   Diagnosis Date Noted   Pain in left knee 06/04/2021   History of PCOS 02/21/2017   High risk medication use 09/21/2016   Rheumatoid arthritis involving multiple sites with positive rheumatoid factor 09/21/2016   History of hypothyroidism 09/21/2016   History of depression 09/21/2016    Past Medical History:  Diagnosis Date   Arthritis    COVID    History of depression    History of hypothyroidism    History of PCOS    Rheumatoid arthritis     Family History  Problem Relation Age of Onset   Hypertension Mother    Clotting disorder Mother    Lung cancer Paternal Uncle    Cancer Maternal Grandmother        lung   Past Surgical History:  Procedure Laterality Date   BUNIONECTOMY Right    FOOT SURGERY Right 08/24/2021   Social History   Social History Narrative   Left  Handed    Lives in a one story home   Drinks Caffeine    Immunization History  Administered Date(s) Administered   PFIZER(Purple Top)SARS-COV-2 Vaccination 10/23/2021     Objective: Vital Signs: BP (!) 135/90 (BP Location: Left Arm, Patient Position: Sitting, Cuff Size: Large)   Pulse 79   Resp 16   Ht 5\' 2"  (1.575 m)   Wt 258 lb 12.8 oz (117.4 kg)   BMI 47.34 kg/m    Physical Exam Vitals and nursing note reviewed.  Constitutional:      Appearance: She is  well-developed.  HENT:     Head: Normocephalic and atraumatic.  Eyes:     Conjunctiva/sclera: Conjunctivae normal.  Cardiovascular:     Rate and Rhythm: Normal rate and regular rhythm.     Heart sounds: Normal heart sounds.  Pulmonary:     Effort: Pulmonary effort is normal.     Breath sounds: Normal breath sounds.  Abdominal:     General: Bowel sounds are normal.     Palpations: Abdomen is soft.  Musculoskeletal:     Cervical back: Normal range of motion.  Lymphadenopathy:     Cervical: No cervical adenopathy.  Skin:    General: Skin is warm and dry.     Capillary Refill: Capillary refill takes less than 2 seconds.  Neurological:     Mental Status: She is alert and oriented to person, place, and time.  Psychiatric:        Behavior: Behavior normal.      Musculoskeletal Exam: C-spine, thoracic spine, and lumbar spine good ROM.  Shoulder joints have good range of motion with some discomfort bilaterally.  Elbow joints have good range of motion.  Limited range of motion of both wrist joints.  Thickening of all MCP joints with ulnar deviation especially in the right hand.  Tenderness and synovitis over the right fifth MCP joint.  Hip joints have good range of motion.  Knee joints have good range of motion with some warmth in the left knee.  Ankle joints have good range of motion with no joint tenderness.  CDAI Exam: CDAI Score: 6.2  Patient Global: 6 mm; Provider Global: 6 mm Swollen: 1 ; Tender: 4  Joint Exam 10/18/2022      Right  Left  MCP 2   Tender     MCP 5  Swollen Tender     Knee   Tender   Tender     Investigation: No additional findings.  Imaging: No results found.  Recent Labs: Lab Results  Component Value Date   WBC 6.9 07/22/2022   HGB 12.6 07/22/2022   PLT 334 07/22/2022   NA 138 07/22/2022   K 3.9 07/22/2022   CL 103 07/22/2022   CO2 19 (L) 07/22/2022   GLUCOSE 104 (H) 07/22/2022   BUN 13 07/22/2022   CREATININE 0.69 07/22/2022   BILITOT 0.3  07/22/2022   ALKPHOS 89 07/22/2022   AST 14 07/22/2022   ALT 15 07/22/2022   PROT 7.0 07/22/2022   ALBUMIN 4.3 07/22/2022   CALCIUM 9.3 07/22/2022   GFRAA 134 07/18/2020   QFTBGOLD Negative 08/13/2016   QFTBGOLDPLUS NEGATIVE 10/01/2021    Speciality Comments: PLQ Eye Exam: 01/16/2021 WNL @ [facility] Follow up in 6 months.   Prior therapy: Orencia (inadequate response 9/18-9/19), Enbrel (inadequate response 3/15/5/15), Humira (inadequate response 11/17-3/18), and injectable methotrexate (non-compliance)  Procedures:  No procedures performed Allergies: Keflex [cephalexin]   Assessment / Plan:  Visit Diagnoses: Rheumatoid arthritis involving multiple sites with positive rheumatoid factor - Severe, erosive rheumatoid arthritis involving multiple joints: Patient was started on Kevzara on 09/02/2022.  She has been experiencing injection site reactions with each dose every 14 days.  She has not noticed any clinical benefit since switching from [place] to [place].  She continues to have pain in both hands, both knees, both feet.  On examination she has tenderness and synovitis over the right fifth MCP joint.  Her discomfort has been constant for the past 1 month to the point that it is interfering with her responsibilities at work.  Plan to schedule an ultrasound-guided right left fifth MCP joint injection.   She presents today to discuss other treatment options.  Plan to switch patient to IV Actemra as previously discussed.  Indications, contraindications, and potential side effects of Actemra were discussed today in detail.  Consent was updated.  Patient will remain on Plaquenil and methotrexate as combination therapy.  She will follow-up in the office in about 8 weeks to assess her response.  Medication Counseling: Baseline Immunosuppressant Therapy Labs    Latest Ref Rng & Units 10/01/2021    9:38 AM  Quantiferon TB Gold  Quantiferon TB Gold Plus NEGATIVE NEGATIVE       Latest Ref Rng  & Units 07/22/2022    9:51 AM  Serum Protein Electrophoresis  Total Protein 6.0 - 8.5 g/dL 7.0    Lipid Panel Lab Results  Component Value Date   CHOL 148 12/01/2021   HDL 57 12/01/2021   LDLCALC 75 12/01/2021   TRIG 81 12/01/2021   CHOLHDL 2.6 12/01/2021     Chest x-ray: 05/24/22   Counseled patient that Actemra is an IL-6 blocking agent.  Reviewed Actemra dose of 4 mg/kg every 4 weeks.  Counseled patient on purpose, proper use, and adverse effects of Actemra.  Reviewed the most common adverse effects including infections, injection site reaction, bowel injury, and rarely cancer and conditions of the nervous system.  Reviewed that the medication should be held during infections.  Discussed that there is the possibility of an increased risk of malignancy but it is not well understood if this increased risk is due to the medication or the disease state.  Counseled patient that Actemra should be held prior to scheduled surgery.  Counseled patient to avoid live vaccines while on Actemra.  Recommend patient to get annual influenza vaccine, pneumococcal vaccine and Shingrix as indicated.   Reviewed the importance of regular labs while on Actemra therapy including the need for routine lipid panel. Standing orders placed.  Provided patient with medication education material and answered all questions.  Patient voiced understanding.  Patient consented to Actemra.  Will upload consent into the media tab.  Will submit benefits investigation for Actemra.    High risk medication use -Applying for IV Actemra through her insurance. Plaquenil 200 mg 1 tablet BID Monday through Friday (restarted 10/2021), Methotrexate 8 tablets by mouth once wkly, and folic acid 2 mg daily.  Inadequate response to [place], rinvoq, orencia, humira, enbrel, and injectable MTX (compliance issue), kevzara-discontinued due to injection site reactions.   PLQ Eye Exam: 01/16/2021 WNL @ [facility].  Patient is scheduled to update a  Plaquenil eye examination today.  Patient was given a Plaquenil eye examination to take with her to her upcoming appointment. CBC and CMP updated on 07/22/22. TB gold negative on 10/01/2021.  Order for TB gold released today. Discussed the importance of holding actemra if she  develops signs or symptoms of an infection and to resume once the infection has completely cleared.   Plan: QuantiFERON-TB Gold Plus, Lipid panel, CBC with Differential/Platelet, COMPLETE METABOLIC PANEL WITH GFR  Screening for tuberculosis -Order for TB gold released today. Plan: QuantiFERON-TB Gold Plus  Lipid screening - Lipid panel updated today. Patient is fasting. 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  She has been having constant discomfort in her right fifth MCP joint.  She has some tenderness and mild inflammation noted on examination today.  Offered to schedule an ultrasound-guided right fifth MCP joint and she was in agreement.  She will be switching from subcutaneous kevzara to IV actemra.    Trigger finger, right ring finger: Not currently locking.   Primary osteoarthritis of both knees: She has been experiencing increased pain in both knee joints intermittently.  On examination today showed good range of motion of both knee joints with mild warmth in the left knee.  No effusion was noted.  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 Marylene Land.  She underwent right foot reconstructive surgery on 08/24/2021 which was successful. She has chronic pain in both feet.  Followed by podiatry.  According to the patient she had recent injections about 2 weeks ago which have been beneficial.   History of depression: Patient was switched from Paxil to Cymbalta.  She is currently taking Cymbalta 60 mg daily.  Other medical conditions are listed as  follows:  History of hypothyroidism  History of PCOS  History of pneumonia   Orders: Orders Placed This Encounter  Procedures   QuantiFERON-TB Gold Plus   Lipid panel   CBC with Differential/Platelet   COMPLETE METABOLIC PANEL WITH GFR   No orders of the defined types were placed in this encounter.    Follow-Up Instructions: Return in about 8 weeks (around 12/13/2022) for Rheumatoid arthritis.   Gearldine Bienenstock, PA-C  Note - This record has been created using Dragon software.  Chart creation errors have been sought, but may not always  have been located. Such creation errors do not reflect on  the standard of medical care.

## 2022-10-18 NOTE — Progress Notes (Signed)
Referral to [facility] placed for IV Actemra infusions  Dose: Actemra /kg every 4 weeks.  Can consider increasing to /kg in future if needed. CBC w diff and CMP at Week 4 then every 12 weeks  Premedications: acetaminophen  PO + diphenhydramine  PO  Chesley Mires, PharmD, MPH, BCPS, CPP Clinical Pharmacist (Rheumatology and Pulmonology)

## 2022-10-19 ENCOUNTER — Telehealth: Payer: Self-pay

## 2022-10-19 DIAGNOSIS — Z79899 Other long term (current) drug therapy: Secondary | ICD-10-CM

## 2022-10-19 LAB — COMPLETE METABOLIC PANEL WITH GFR
AG Ratio: 2 (calc) (ref 1.0–2.5)
Chloride: 102 mmol/L (ref 98–110)
Potassium: 4.1 mmol/L (ref 3.5–5.3)
Total Protein: 6.8 g/dL (ref 6.1–8.1)

## 2022-10-19 LAB — CBC WITH DIFFERENTIAL/PLATELET
Eosinophils Relative: 0 %
MCHC: 33.8 g/dL (ref 32.0–36.0)
MCV: 89.4 fL (ref 80.0–100.0)
MPV: 11 fL (ref 7.5–12.5)
RDW: 14.7 % (ref 11.0–15.0)

## 2022-10-19 LAB — LIPID PANEL
LDL Cholesterol (Calc): 74 mg/dL (calc)
Non-HDL Cholesterol (Calc): 87 mg/dL (calc) (ref <130)
Triglycerides: 54 mg/dL (ref <150)

## 2022-10-19 NOTE — Progress Notes (Signed)
CMP WNL Lipid panel WNL  WBC count is low-2.6.  Absolute neutrophils and eosinophils are low.   Plan to discontinue kevzara.   She will require close monitoring switching to IV actemra.  Repeat CBC With diff in 1 month.

## 2022-10-19 NOTE — Telephone Encounter (Signed)
-----   Message from Gearldine Bienenstock, PA-C sent at 10/19/2022  7:15 AM EDT ----- CMP WNL Lipid panel WNL  WBC count is low-2.6.  Absolute neutrophils and eosinophils are low.   Plan to discontinue kevzara.   She will require close monitoring switching to IV actemra.  Repeat CBC With diff in 1 month.

## 2022-10-20 LAB — QUANTIFERON-TB GOLD PLUS
Mitogen-NIL: 8.94 IU/mL
NIL: 0.01 IU/mL
QuantiFERON-TB Gold Plus: NEGATIVE
TB1-NIL: 0 IU/mL
TB2-NIL: 0 IU/mL

## 2022-10-20 LAB — COMPLETE METABOLIC PANEL WITH GFR
ALT: 20 U/L (ref 6–29)
AST: 20 U/L (ref 10–30)
Albumin: 4.5 g/dL (ref 3.6–5.1)
Alkaline phosphatase (APISO): 62 U/L (ref 31–125)
BUN: 12 mg/dL (ref 7–25)
CO2: 29 mmol/L (ref 20–32)
Calcium: 9.3 mg/dL (ref 8.6–10.2)
Creat: 0.69 mg/dL (ref 0.50–0.97)
Globulin: 2.3 g/dL (calc) (ref 1.9–3.7)
Glucose, Bld: 93 mg/dL (ref 65–99)
Sodium: 138 mmol/L (ref 135–146)
Total Bilirubin: 0.4 mg/dL (ref 0.2–1.2)
eGFR: 117 mL/min/{1.73_m2} (ref >=60)

## 2022-10-20 LAB — CBC WITH DIFFERENTIAL/PLATELET
Absolute Monocytes: 504 cells/uL (ref 200–950)
Basophils Absolute: 21 cells/uL (ref 0–200)
Basophils Relative: 0.8 %
Eosinophils Absolute: 0 cells/uL — ABNORMAL LOW (ref 15–500)
Hemoglobin: 12.8 g/dL (ref 11.7–15.5)
MCH: 30.2 pg (ref 27.0–33.0)
Monocytes Relative: 19.4 %
Neutrophils Relative %: 38.3 %
Platelets: 214 10*3/uL (ref 140–400)
RBC: 4.24 10*6/uL (ref 3.80–5.10)
WBC: 2.6 10*3/uL — ABNORMAL LOW (ref 3.8–10.8)

## 2022-10-20 LAB — LIPID PANEL
Cholesterol: 152 mg/dL (ref <200)
HDL: 65 mg/dL (ref >=50)
Total CHOL/HDL Ratio: 2.3 (calc) (ref <5.0)

## 2022-10-20 NOTE — Progress Notes (Signed)
TB gold negative

## 2022-10-21 ENCOUNTER — Telehealth: Payer: Self-pay | Admitting: Pharmacy Technician

## 2022-10-21 DIAGNOSIS — M0579 Rheumatoid arthritis with rheumatoid factor of multiple sites without organ or systems involvement: Secondary | ICD-10-CM

## 2022-10-21 NOTE — Telephone Encounter (Addendum)
Devki, The denial has been scanned to media tab for your review. (Medical benefit denied)  Pharmacy benefit: approved (appeal approved)  Auth Submission:  Site of care: Site of care: [facility] Payer: BCBS Medication & CPT/J Code(s) submitted: Actemra (Tocilizumab) [id number]) Route of submission (phone, fax, portal):  Phone # [phone] Fax # Auth type: pharmacy benefit Units/visits requested:  Reference number: [id number] A [place] Approval from: 09/29/22 to  10/29/23  DENIED due to patient has not tried and or failed preferred medications. 1, avsola 2. Remicade 3. Simponi Aria (DENIAL: APPROVED)  Actemra is an excluded medication under Medical benefit. Must use pharmacy benefit (specialty pharmacy) [facility]: [phone] Rep: Ronald Lobo

## 2022-10-27 NOTE — Telephone Encounter (Signed)
Submitted an URGENT appeal to [facility] for  IV ACTEMRA .  Reference # [id number] Phone: [phone] Fax: [phone]   Chesley Mires, PharmD, MPH, BCPS, CPP Clinical Pharmacist (Rheumatology and Pulmonology)

## 2022-11-01 ENCOUNTER — Encounter: Payer: Self-pay | Admitting: Physician Assistant

## 2022-11-01 NOTE — Telephone Encounter (Addendum)
Devki, Appeal has been approved. [facility]. Approval: [id number] A-ME DATE: 09/29/22 - 10/29/23  PHONE: [phone]

## 2022-11-02 MED ORDER — ACTEMRA 80 MG/4ML IV SOLN
INTRAVENOUS | 5 refills | Status: DC
Start: 2022-11-02 — End: 2022-11-12

## 2022-11-02 NOTE — Addendum Note (Signed)
Addended by: Murrell Redden on: 11/02/2022 02:21 PM   Modules accepted: Orders

## 2022-11-02 NOTE — Telephone Encounter (Signed)
Shannon David, Noted thanks

## 2022-11-02 NOTE — Telephone Encounter (Signed)
Rx for Actemra 80mg /4 mL vial [facility]  Dose: 4mg /kg = 469.6mg  --> 6 vials needed (24mL)  Routing to KIM C  Chesley Mires, PharmD, MPH, BCPS, CPP Clinical Pharmacist (Rheumatology and Pulmonology)

## 2022-11-09 ENCOUNTER — Other Ambulatory Visit: Payer: BC Managed Care – PPO | Admitting: Rheumatology

## 2022-11-11 ENCOUNTER — Other Ambulatory Visit: Payer: Self-pay

## 2022-11-11 ENCOUNTER — Emergency Department (HOSPITAL_COMMUNITY)
Admission: EM | Admit: 2022-11-11 | Discharge: 2022-11-11 | Disposition: A | Discharge status: Home / Self Care | Payer: BC Managed Care – PPO | Attending: Emergency Medicine | Admitting: Emergency Medicine

## 2022-11-11 ENCOUNTER — Encounter: Payer: Self-pay | Admitting: Physician Assistant

## 2022-11-11 ENCOUNTER — Ambulatory Visit (HOSPITAL_COMMUNITY)
Admission: EM | Admit: 2022-11-11 | Discharge: 2022-11-12 | Disposition: A | Discharge status: Home / Self Care | Payer: BC Managed Care – PPO | Attending: Psychiatry | Admitting: Psychiatry

## 2022-11-11 ENCOUNTER — Encounter (HOSPITAL_COMMUNITY): Payer: Self-pay

## 2022-11-11 DIAGNOSIS — E039 Hypothyroidism, unspecified: Secondary | ICD-10-CM | POA: Insufficient documentation

## 2022-11-11 DIAGNOSIS — Z79899 Other long term (current) drug therapy: Secondary | ICD-10-CM | POA: Insufficient documentation

## 2022-11-11 DIAGNOSIS — R45851 Suicidal ideations: Secondary | ICD-10-CM | POA: Diagnosis not present

## 2022-11-11 DIAGNOSIS — Z8616 Personal history of COVID-19: Secondary | ICD-10-CM | POA: Insufficient documentation

## 2022-11-11 DIAGNOSIS — R946 Abnormal results of thyroid function studies: Secondary | ICD-10-CM | POA: Diagnosis not present

## 2022-11-11 DIAGNOSIS — F3341 Major depressive disorder, recurrent, in partial remission: Secondary | ICD-10-CM

## 2022-11-11 DIAGNOSIS — F339 Major depressive disorder, recurrent, unspecified: Secondary | ICD-10-CM | POA: Diagnosis not present

## 2022-11-11 DIAGNOSIS — F331 Major depressive disorder, recurrent, moderate: Secondary | ICD-10-CM

## 2022-11-11 DIAGNOSIS — F329 Major depressive disorder, single episode, unspecified: Secondary | ICD-10-CM | POA: Diagnosis not present

## 2022-11-11 DIAGNOSIS — R4589 Other symptoms and signs involving emotional state: Secondary | ICD-10-CM

## 2022-11-11 DIAGNOSIS — F332 Major depressive disorder, recurrent severe without psychotic features: Secondary | ICD-10-CM

## 2022-11-11 HISTORY — DX: Suicidal ideations: R45.851

## 2022-11-11 LAB — COMPREHENSIVE METABOLIC PANEL
ALT: 19 U/L (ref 0–44)
AST: 17 U/L (ref 15–41)
Albumin: 3.6 g/dL (ref 3.5–5.0)
Alkaline Phosphatase: 53 U/L (ref 38–126)
Anion gap: 8 (ref 5–15)
BUN: 10 mg/dL (ref 6–20)
CO2: 24 mmol/L (ref 22–32)
Calcium: 8.6 mg/dL — ABNORMAL LOW (ref 8.9–10.3)
Chloride: 103 mmol/L (ref 98–111)
Creatinine, Ser: 0.64 mg/dL (ref 0.44–1.00)
GFR, Estimated: >60 mL/min (ref >60)
Glucose, Bld: 95 mg/dL (ref 70–99)
Potassium: 3.8 mmol/L (ref 3.5–5.1)
Sodium: 135 mmol/L (ref 135–145)
Total Bilirubin: 0.7 mg/dL (ref 0.3–1.2)
Total Protein: 7 g/dL (ref 6.5–8.1)

## 2022-11-11 LAB — ACETAMINOPHEN LEVEL: Acetaminophen (Tylenol), Serum: <10 ug/mL — ABNORMAL LOW (ref 10–30)

## 2022-11-11 LAB — RAPID URINE DRUG SCREEN, HOSP PERFORMED
Amphetamines: NOT DETECTED
Barbiturates: NOT DETECTED
Benzodiazepines: NOT DETECTED
Cocaine: NOT DETECTED
Opiates: NOT DETECTED
Tetrahydrocannabinol: NOT DETECTED

## 2022-11-11 LAB — CBC
HCT: 38.1 % (ref 36.0–46.0)
Hemoglobin: 12.7 g/dL (ref 12.0–15.0)
MCH: 31.2 pg (ref 26.0–34.0)
MCHC: 33.3 g/dL (ref 30.0–36.0)
MCV: 93.6 fL (ref 80.0–100.0)
Platelets: 265 10*3/uL (ref 150–400)
RBC: 4.07 MIL/uL (ref 3.87–5.11)
RDW: 13.6 % (ref 11.5–15.5)
WBC: 5 10*3/uL (ref 4.0–10.5)
nRBC: 0 % (ref 0.0–0.2)

## 2022-11-11 LAB — I-STAT BETA HCG BLOOD, ED (MC, WL, AP ONLY): I-stat hCG, quantitative: <5 m[IU]/mL (ref <5)

## 2022-11-11 LAB — TSH: TSH: 5.062 u[IU]/mL — ABNORMAL HIGH (ref 0.350–4.500)

## 2022-11-11 LAB — SALICYLATE LEVEL: Salicylate Lvl: <7 mg/dL — ABNORMAL LOW (ref 7.0–30.0)

## 2022-11-11 LAB — ETHANOL: Alcohol, Ethyl (B): <10 mg/dL (ref <10)

## 2022-11-11 MED ORDER — METHOTREXATE SODIUM 2.5 MG PO TABS: 10.0000 mg | ORAL_TABLET | ORAL | Status: DC

## 2022-11-11 MED ORDER — ACETAMINOPHEN 500 MG PO TABS
1000.0000 mg | ORAL_TABLET | Freq: Once | ORAL | Status: AC
  Administered 2022-11-11: 1000 mg via ORAL
  Filled 2022-11-11: qty 2

## 2022-11-11 MED ORDER — ONDANSETRON HCL 4 MG PO TABS: 4.0000 mg | ORAL_TABLET | ORAL | Status: DC

## 2022-11-11 MED ORDER — ALBUTEROL SULFATE HFA 108 (90 BASE) MCG/ACT IN AERS: 1.0000 | INHALATION_SPRAY | Freq: Four times a day (QID) | RESPIRATORY_TRACT | Status: DC | PRN

## 2022-11-11 MED ORDER — OLANZAPINE 5 MG PO TBDP: 5.0000 mg | ORAL_TABLET | Freq: Three times a day (TID) | ORAL | Status: DC | PRN

## 2022-11-11 MED ORDER — PAROXETINE HCL 20 MG PO TABS: 20.0000 mg | ORAL_TABLET | Freq: Every day | ORAL | 11 refills | Status: DC

## 2022-11-11 MED ORDER — ALUM & MAG HYDROXIDE-SIMETH 200-200-20 MG/5ML PO SUSP: 30.0000 mL | ORAL | Status: DC | PRN

## 2022-11-11 MED ORDER — ZIPRASIDONE MESYLATE 20 MG IM SOLR: 20.0000 mg | INTRAMUSCULAR | Status: DC | PRN

## 2022-11-11 MED ORDER — TRAMADOL HCL 50 MG PO TABS: 50.0000 mg | ORAL_TABLET | Freq: Three times a day (TID) | ORAL | Status: DC | PRN

## 2022-11-11 MED ORDER — ACETAMINOPHEN 325 MG PO TABS: 650.0000 mg | ORAL_TABLET | Freq: Four times a day (QID) | ORAL | Status: DC | PRN

## 2022-11-11 MED ORDER — LEVOTHYROXINE SODIUM 100 MCG PO TABS: 100.0000 ug | ORAL_TABLET | Freq: Every day | ORAL | Status: DC

## 2022-11-11 MED ORDER — GABAPENTIN 300 MG PO CAPS: 300.0000 mg | ORAL_CAPSULE | Freq: Three times a day (TID) | ORAL | Status: DC | PRN

## 2022-11-11 MED ORDER — ALUM & MAG HYDROXIDE-SIMETH 200-200-20 MG/5ML PO SUSP: 30.0000 mL | Freq: Four times a day (QID) | ORAL | Status: DC | PRN

## 2022-11-11 MED ORDER — ONDANSETRON HCL 4 MG PO TABS: 4.0000 mg | ORAL_TABLET | Freq: Three times a day (TID) | ORAL | Status: DC | PRN

## 2022-11-11 MED ORDER — LORAZEPAM 1 MG PO TABS: 1.0000 mg | ORAL_TABLET | ORAL | Status: DC | PRN

## 2022-11-11 MED ORDER — MAGNESIUM HYDROXIDE 400 MG/5ML PO SUSP: 30.0000 mL | Freq: Every day | ORAL | Status: DC | PRN

## 2022-11-11 MED ORDER — ACETAMINOPHEN 325 MG PO TABS: 650.0000 mg | ORAL_TABLET | ORAL | Status: DC | PRN

## 2022-11-11 NOTE — Discharge Summary (Signed)
[facility] ED Discharge  11/11/2022 5:05 PM Shannon David  MRN:  [id number]  Principal Problem: Passive suicidal ideations Discharge Diagnoses: Principal Problem:   Passive suicidal ideations Active Problems:   Major depressive disorder, recurrent episode (HCC)  Clinical Impression:  Final diagnoses:  Suicidal ideation  Episode of recurrent major depressive disorder, unspecified depression episode severity (HCC)   ED Assessment Time Calculation: Start Time: 0315 Stop Time: 0345 Total Time in Minutes (Assessment Completion): 30   Subjective: "I've been having bad thoughts for almost one week now"  Shannon David 32 y.o., female patient presented [facility], voluntarily, at the request of her PCP for mental health evaluation due to on-going suicidal ideations x 1 week.   [name], 32 y.o., female with a mental health history significant MDD and chronic Rheumatoid Arthritis patient seen face to face by this provider, consulted with Dr. Lucianne Muss; and chart reviewed on 11/11/22.    On evaluation Shannon David reports that she recently consulted with her PCP a few weeks ago requesting to change her antidepressant from Paxil to Cymbalta with goal of managing depressive symptoms and chronic pain related to RA. Per patient, PCP tapered her off the Paxil and started Cymbalta two weeks ago and she began to experience suicidal ideations approximately one week ago. She denies any prior suicide attempts and has never been psychiatrically admitted for any mental health treatment. She denies any specific plan, however endorses that thoughts are present and she feels depressed since making the recent medication change. She last took Cymbalta on Tuesday. She saw her PCP on Tuesday and she advised patient to go to the ER for mental health evaluation. Patient presented yesterday to [facility] ED however, left due to the wait time. Patient denies any illicit substance use or alcohol dependence. Patient would  like to establish with a mental health provider for medication management and therapy.   During evaluation Shannon David is calm laying on stretcher with head of the bed elevated, in no acute distress.  She is alert, oriented x 4, calm, cooperative and attentive. Her mood is euthymic with congruent affect.  She has normal speech, and behavior.  Objectively there is no evidence of psychosis/mania or delusional thinking.  Patient is able to converse coherently, goal directed thoughts, no distractibility, or pre-occupation.  She also denies suicidal/self-harm/homicidal ideation, psychosis, and paranoia.  Patient answered question appropriately.  Patient gives permission to speak with her husband, [name], [phone] who endorses that he has no safety concerns with patient returning home. He endorses that patient will be safe at home and that he will monitor patient and ensure she follow-ups with PCP and mental health provider once she is established.    Patient is able to contract for safety and do not meet inpatient or IVC criteria and will be discharged home with resources and will restart Paxil 20 mg daily.  Past Psychiatric History:   Past Medical History:  Past Medical History:  Diagnosis Date   Arthritis    COVID    History of depression    History of hypothyroidism    History of PCOS    Rheumatoid arthritis (HCC)     Past Surgical History:  Procedure Laterality Date   BUNIONECTOMY Right    FOOT SURGERY Right 08/24/2021   Family History:  Family History  Problem Relation Age of Onset   Hypertension Mother    Clotting disorder Mother    Lung cancer Paternal Uncle    Cancer Maternal Grandmother  lung   Social History:  Social History   Substance and Sexual Activity  Alcohol Use No     Social History   Substance and Sexual Activity  Drug Use No    Social History   Socioeconomic History   Marital status: Married    Spouse name: Not on file   Number of  children: 0   Years of education: Not on file   Highest education level: Not on file  Occupational History   Not on file  Tobacco Use   Smoking status: Never    Passive exposure: Never   Smokeless tobacco: Never  Vaping Use   Vaping Use: Never used  Substance and Sexual Activity   Alcohol use: No   Drug use: No   Sexual activity: Not on file  Other Topics Concern   Not on file  Social History Narrative   Left Handed    Lives in a one story home   Drinks Caffeine    Social Determinants of Health   Financial Resource Strain: Not on file  Food Insecurity: Not on file  Transportation Needs: Not on file  Physical Activity: Not on file  Stress: Not on file  Social Connections: Not on file    Tobacco Cessation:  N/A, patient does not currently use tobacco products  Current Medications: Current Facility-Administered Medications  Medication Dose Route Frequency Provider Last Rate Last Admin   acetaminophen (TYLENOL) tablet 650 mg  650 mg Oral Q4H PRN Long, Arlyss Repress, MD       albuterol (VENTOLIN HFA) 108 (90 Base) MCG/ACT inhaler 1-2 puff  1-2 puff Inhalation Q6H PRN Long, Arlyss Repress, MD       alum & mag hydroxide-simeth (MAALOX/MYLANTA) 200-200-20 MG/5ML suspension 30 mL  30 mL Oral Q6H PRN Long, Arlyss Repress, MD       gabapentin (NEURONTIN) capsule 300 mg  300 mg Oral TID PRN Long, Arlyss Repress, MD       [START ON 11/12/2022] levothyroxine (SYNTHROID) tablet 100 mcg  100 mcg Oral Q0600 Long, Arlyss Repress, MD       [START ON 11/17/2022] methotrexate (RHEUMATREX) tablet 10 mg  10 mg Oral 2 times per day on Wed Long, Joshua G, MD       ondansetron [facility]) tablet 4 mg  4 mg Oral Q8H PRN Long, Arlyss Repress, MD       [START ON 11/17/2022] ondansetron (ZOFRAN) tablet 4 mg  4 mg Oral 2 times per day on Wed Long, Joshua G, MD       traMADol Janean Sark) tablet 50 mg  50 mg Oral TID PRN Long, Arlyss Repress, MD       Current Outpatient Medications  Medication Sig Dispense Refill   albuterol (VENTOLIN HFA) 108 (90  Base) MCG/ACT inhaler Inhale 1-2 puffs into the lungs every 6 (six) hours as needed for wheezing or shortness of breath. 18 g 0   Cholecalciferol (VITAMIN D3) 50 MCG (2000 UT) capsule Take 2,000 Units by mouth daily.     folic acid (FOLVITE) 1 MG tablet Take 2 tablets (2 mg total) by mouth daily. 180 tablet 3   gabapentin (NEURONTIN) 300 MG capsule Take 300 mg by mouth 3 (three) times daily as needed (for neuropathy).     hydroxychloroquine (PLAQUENIL) 200 MG tablet TAKE 1 TABLET BY MOUTH TWICE A DAY MONDAY-FRIDAY (Patient taking differently: Take 400 mg by mouth every Monday, Tuesday, Wednesday, Thursday, and Friday.) 40 tablet 2   ibuprofen (IBU-200) 200 MG tablet Take  400 mg by mouth every 6 (six) hours as needed for mild pain or headache.     levothyroxine (SYNTHROID) 100 MCG tablet Take 100 mcg by mouth daily before breakfast.     methotrexate (RHEUMATREX) 2.5 MG tablet TAKE 8 TABLETS (20 MG TOTAL) BY MOUTH ONCE A WEEK. CAUTION:CHEMOTHERAPY. PROTECT FROM LIGHT. (Patient taking differently: Take 10 mg by mouth See admin instructions. Take 10 mg by mouth in the morning AND evening every Wednesday. CAUTION:CHEMOTHERAPY. PROTECT FROM LIGHT.) 64 tablet 0   naloxone (NARCAN) nasal spray 4 mg/0.1 mL Place 1 spray into the nose once as needed (AS DIRECTED).     ondansetron (ZOFRAN) 4 MG tablet Take 1 tablet (4 mg total) by mouth as needed. (Patient taking differently: Take 4 mg by mouth See admin instructions. Take 4 mg by mouth before each dose of Methotrexate tablets taken on Wednesdays- and an additional 4 mg once a day as needed for nausea and/or vomiting) 30 tablet 1   PARoxetine (PAXIL) 20 MG tablet Take 1 tablet (20 mg total) by mouth daily. 30 tablet 11   traMADol (ULTRAM) 50 MG tablet Take 50 mg by mouth 3 (three) times daily as needed (for pain).     ibuprofen (ADVIL) 800 MG tablet Take 1 tablet (800 mg total) by mouth every 8 (eight) hours as needed. For mild pain and inflammation while you are  off your rheumatologic meds (Patient not taking: Reported on 11/11/2022) 30 tablet 0   predniSONE (DELTASONE) 5 MG tablet Take 4 tabs po x 7 days, 3  tabs po x 7 days, 2  tabs po x 7 days, 1  tab po x 7 days (Patient not taking: Reported on 10/18/2022) 70 tablet 0   promethazine (PHENERGAN) 12.5 MG tablet Take 1 tablet (12.5 mg total) by mouth every 6 (six) hours as needed for nausea or vomiting. (Patient not taking: Reported on 11/11/2022) 30 tablet 0   Tocilizumab (ACTEMRA) 80 MG/4ML SOLN injection Infuse 4mg /kg every 4 weeks (weight 117.4kg on 10/18/22). Deliver to infusion center (Patient not taking: Reported on 11/11/2022) 24 mL 5   PTA Medications: (Not in a hospital admission)   [place] Scale:  Flowsheet Row ED from 11/11/2022 in [facility] Emergency Department at [facility]  C-SSRS RISK CATEGORY Low Risk      Psychiatric Specialty Exam: Presentation  General Appearance: Appropriate for Environment  Eye Contact:Good  Speech:Clear and Coherent  Speech Volume:Normal  Handedness:Right   Mood and Affect  Mood:Euthymic  Affect:Appropriate   Thought Process  Thought Processes:Coherent; Goal Directed; Linear  Descriptions of Associations:Intact  Orientation:Full (Time, Place and Person)  Thought Content:Logical  History of Schizophrenia/Schizoaffective disorder:No data recorded Duration of Psychotic Symptoms:No data recorded Hallucinations:Hallucinations: None  Ideas of Reference:None  Suicidal Thoughts:Suicidal Thoughts: Yes, Passive SI Passive Intent and/or Plan: Without Intent; Without Plan  Homicidal Thoughts:Homicidal Thoughts: No   Sensorium  Memory:Immediate Good; Recent Good; Remote Good  Judgment:Good  Insight:Good   Executive Functions  Concentration:Good  Attention Span:Good  Recall:Good  Fund of Knowledge:Good  Language:Good   Psychomotor Activity  Psychomotor Activity:Psychomotor Activity: Normal   Assets   Assets:Communication Skills; Desire for Improvement; Physical Health   Sleep  Sleep:Sleep: Good Number of Hours of Sleep: 0 (Takes Gabapentin for pain and this medication helps with sleep)    Physical Exam: Physical Exam Vitals reviewed.  Constitutional:      Appearance: Normal appearance.  HENT:     Head: Normocephalic.  Eyes:     Extraocular Movements:  Extraocular movements intact.     Pupils: Pupils are equal, round, and reactive to light.  Cardiovascular:     Rate and Rhythm: Normal rate and regular rhythm.  Pulmonary:     Effort: Pulmonary effort is normal.     Breath sounds: Normal breath sounds.  Musculoskeletal:        General: Normal range of motion.     Cervical back: Normal range of motion.  Neurological:     General: No focal deficit present.     Mental Status: She is alert.  Psychiatric:        Mood and Affect: Mood normal.        Behavior: Behavior normal.    Review of Systems  Psychiatric/Behavioral:  Positive for depression and suicidal ideas.        Passive SI no plan   Blood pressure (!) 136/95, pulse 89, temperature 98.1 F (36.7 C), temperature source Oral, resp. rate 18, height 5\' 2"  (1.575 m), weight 117.9 kg, SpO2 95 %. Body mass index is 47.55 kg/m.   Demographic Factors:  Caucasian  Loss Factors: NA  Historical Factors: NA  Risk Reduction Factors:   Living with another person, especially a relative, Positive social support, and Positive therapeutic relationship  Continued Clinical Symptoms:  Chronic Pain  Cognitive Features That Contribute To Risk:  None    Suicide Risk:  Mild:  Suicidal ideation of limited frequency, intensity, duration, and specificity.  There are no identifiable plans, no associated intent, mild dysphoria and related symptoms, good self-control (both objective and subjective assessment), few other risk factors, and identifiable protective factors, including available and accessible social  support.    Plan Of Care/Follow-up recommendations:  Referral placed for [facility] outpatient Restart Paxil 20 mg daily with plan to follow-up with PCP within 7-10 days for re-evaluation. Hold/DC Cymbalta  Medical Decision Making: Patient does not meet inpatient criteria for psychiatric treatment. Patient is able to  contract for safety.  Patient discharged home. Collateral obtained from husband. Patient to follow-up with PCP and establish with outpatient psychiatric provider.  Problem 1: Passive Suicidal Ideations Problem 2: Major Depressive Disorder   -Referral placed for [facility] outpatient -Resources given for [facility] , [facility], 988 -Restart Paxil 20 mg daily with plan to follow-up with PCP within 7-10 days for re-evaluation. -Hold/DC Cymbalta    Disposition: Data Unavailable  Joaquin Courts, NP 11/11/2022, 5:05 PM

## 2022-11-11 NOTE — ED Triage Notes (Signed)
Patient presents with her husband. Said her doctor wanted her to have a psychiatric evaluation due to patient feeling suicidal for 1 week. Does not have a plan, only thoughts. Had a change in medication. Was on Paxil and changed to Cymbalta.

## 2022-11-11 NOTE — ED Provider Notes (Signed)
Emergency Department Provider Note   I have reviewed the triage vital signs and the nursing notes.   HISTORY  Chief Complaint Suicidal   HPI Shannon David is a 32 y.o. female with PMH reviewed below including depression presents to the ED with increased suicidal thinking since switching from Paxil to Cymbalta approx 2 weeks prior.  Symptoms have been managed well on Paxil but she was seeing her PCP who thought that switching to Cymbalta may help with some of her RA related pain.  Shortly after the switch, the patient developed increasingly intrusive suicidal thoughts.  She has not developed a plan to harm herself or others.  She does not see a psychiatrist or other counselor.  No alcohol or drug use.  She was referred to the emergency department by her PCP after expressing these thoughts and initially went to the [facility] emergency department but ultimately left without being seen following a Nyari Olsson wait yesterday.  They returned today for psychiatry evaluation. No AH, VH. No HI.    Past Medical History:  Diagnosis Date   Arthritis    COVID    History of depression    History of hypothyroidism    History of PCOS    Rheumatoid arthritis (HCC)     Review of Systems  Constitutional: No fever/chills Cardiovascular: Denies chest pain. Respiratory: Denies shortness of breath. Gastrointestinal: No abdominal pain.  No nausea, no vomiting.  No diarrhea.  No constipation. Genitourinary: Negative for dysuria. Musculoskeletal: Negative for back pain. Skin: Negative for rash. Neurological: Negative for headaches, focal weakness or numbness.  ____________________________________________   PHYSICAL EXAM:  VITAL SIGNS: ED Triage Vitals  Enc Vitals Group     BP 11/11/22 1214 (!) 136/95     Pulse Rate 11/11/22 1214 89     Resp 11/11/22 1214 18     Temp 11/11/22 1214 98.1 F (36.7 C)     Temp Source 11/11/22 1214 Oral     SpO2 11/11/22 1214 95 %     Weight 11/11/22 1217 260 lb  (117.9 kg)     Height 11/11/22 1217 5\' 2"  (1.575 m)   Constitutional: Alert and oriented. Well appearing and in no acute distress. Eyes: Conjunctivae are normal.  Head: Atraumatic. Nose: No congestion/rhinnorhea. Mouth/Throat: Mucous membranes are moist.  Neck: No stridor.   Cardiovascular: Normal rate, regular rhythm. Good peripheral circulation. Grossly normal heart sounds.   Respiratory: Normal respiratory effort.  No retractions. Lungs CTAB. Gastrointestinal: Soft and nontender. No distention.  Musculoskeletal: No lower extremity tenderness nor edema. No gross deformities of extremities. Neurologic:  Normal speech and language. No gross focal neurologic deficits are appreciated.  Skin:  Skin is warm, dry and intact. No rash noted. Psychiatric: Mood and affect are flat. Speech and behavior are normal.  ____________________________________________   LABS (all labs ordered are listed, but only abnormal results are displayed)  Labs Reviewed  COMPREHENSIVE METABOLIC PANEL - Abnormal; Notable for the following components:      Result Value   Calcium 8.6 (*)    All other components within normal limits  SALICYLATE LEVEL - Abnormal; Notable for the following components:   Salicylate Lvl <7.0 (*)    All other components within normal limits  ACETAMINOPHEN LEVEL - Abnormal; Notable for the following components:   Acetaminophen (Tylenol), Serum <10 (*)    All other components within normal limits  TSH - Abnormal; Notable for the following components:   TSH 5.062 (*)    All other components within normal  limits  ETHANOL  CBC  RAPID URINE DRUG SCREEN, HOSP PERFORMED  I-STAT BETA HCG BLOOD, ED ([facility], [facility], [facility] ONLY)   ____________________________________________   PROCEDURES  Procedure(s) performed:   Procedures  None  ____________________________________________   INITIAL IMPRESSION / ASSESSMENT AND PLAN / ED COURSE  Pertinent labs & imaging results that were available  during my care of the patient were reviewed by me and considered in my medical decision making (see chart for details).   This patient is Presenting for Evaluation of SI, which does require a range of treatment options, and is a complaint that involves a high risk of morbidity and mortality.  The Differential Diagnoses include recurrent depressive state, medication side effect, acute psychosis, etc.  Critical Interventions-    Medications  alum & mag hydroxide-simeth (MAALOX/MYLANTA) 200-200-20 MG/5ML suspension 30 mL (has no administration in time range)  ondansetron (ZOFRAN) tablet 4 mg (has no administration in time range)  acetaminophen (TYLENOL) tablet 650 mg (has no administration in time range)  acetaminophen (TYLENOL) tablet 1,000 mg (1,000 mg Oral Given 11/11/22 1335)    Reassessment after intervention: HA improved.    I did obtain Additional Historical Information from husband at bedside.   Clinical Laboratory Tests Ordered, included   Social Determinants of Health Risk denies EtOH or drug use.   Consult complete with TTS. Evaluation pending.   Medical Decision Making: Summary:  Presents emergency department with increasing suicidal thoughts after changing from Paxil to Cymbalta.  No specific plan to harm herself at this time. No medical complaints. Plan for medical clearance and TTS evaluation.   Reevaluation with update and discussion with patient. Labs reassuring. Mild TSH elevation. Will continue synthroid. Patient medically clear for TTS evaluation. Patient is voluntary.   Patient's presentation is most consistent with acute presentation with potential threat to life or bodily function.   Disposition: pending  ____________________________________________  FINAL CLINICAL IMPRESSION(S) / ED DIAGNOSES  Final diagnoses:  Suicidal ideation    Note:  This document was prepared using Dragon voice recognition software and may include unintentional dictation  errors.  Alona Bene, MD, [facility] Emergency Medicine    Zorawar Strollo, Arlyss Repress, MD 11/11/22 1450

## 2022-11-11 NOTE — ED Provider Notes (Signed)
  Physical Exam  BP (!) 136/95 (BP Location: Left Arm)   Pulse 89   Temp 98.1 F (36.7 C) (Oral)   Resp 18   Ht 5\' 2"  (1.575 m)   Wt 117.9 kg   SpO2 95%   BMI 47.55 kg/m   Physical Exam  Procedures  Procedures  ED Course / MDM    Medical Decision Making Amount and/or Complexity of Data Reviewed Labs: ordered.  Risk OTC drugs. Prescription drug management.   Patient seen psychiatry in discharge.  Restarted Paxil.       Benjiman Core, MD 11/11/22 [phone]

## 2022-11-11 NOTE — Progress Notes (Signed)
   11/11/22 2248  [facility] Triage Screening (Walk-ins at [facility] only)  How Did You Hear About [place]? Family/Friend  What Is the Reason for Your Visit/Call Today? Shannon David, 32 y.o., female with a mental health history significant MDD and RA. She presents to the [facility], accompanied by her spouse Shannon David). Her complaint is reported as: "I've been having bad thoughts for almost one week now". Current SI (no plan/no intent). She denies any prior suicide attempts and has never been psychiatrically admitted for any mental health treatment. Her PCP recently changed her Paxil to Cymbalta with goal of managing depressive symptoms and chronic pain related to RA. Per patient, PCP tapered her off the Paxil and started Cymbalta two weeks ago and then she began to experience the suicidal ideations. She presented to [facility] this evening with the same complaint, patient was assessed by a [facility] psychiatric provider, and discharged home with outpatient resources and medication changes. Her spouse states when she arrived home she told him that she wanted to cut herself. Therefore, her spouse decided to bring her to the [facility] for another psychiatric assessment. He believes the medications are making his wife suicidal and wants directive on how to help her.  Patient lives at home with spouse and step daughter. Employed.  How Long Has This Been Causing You Problems? 1 wk - 1 month  Have You Recently Had Any Thoughts About Hurting Yourself? Yes  How long ago did you have thoughts about hurting yourself? Patient reports having suicidal thoughts for the past week.  Are You Planning to Commit Suicide/Harm Yourself At This time? No  Have you Recently Had Thoughts About Hurting Someone Shannon David? No  Are You Planning To Harm Someone At This Time? No  Are you currently experiencing any auditory, visual or other hallucinations? No  Have You Used Any Alcohol or Drugs in the Past 24 Hours? No  Do you have any current medical co-morbidities that  require immediate attention? No  Clinician description of patient physical appearance/behavior: Patient is calm and cooperative. Depressed mood and affect.  What Do You Feel Would Help You the Most Today? Treatment for Depression or other mood problem;Stress Management;Medication(s)  If access to [facility] was not available, would you have sought care in the Emergency Department? No  Determination of Need Routine (7 days)  Options For Referral Outpatient Therapy;Medication Management;Partial Hospitalization;Intensive Outpatient Therapy

## 2022-11-11 NOTE — Discharge Instructions (Addendum)
Discontinue Cymbalta for now. I am restarting Paxil 20 mg daily. Schedule a follow-up with your primary care provider for 7-10 days for medications dose adjustment. Per my records, you were taking Paxil 40 mg daily however, I want to start you back on a lower dose since you've been off the  medication for a few weeks.  If your suicidal feeling become more intrusive or if you develop a plan, go to nearest emergency department, call [phone] or [phone] for help immediately.  I am referring you to [facility], there information is listed on your discharge paperwork.    Safety Plan Shannon David will reach out to her call 911 or call mobile crisis, or go to nearest emergency room if condition worsens or if suicidal thoughts become active Patients' will follow up with PCP and has been referred to outpatient psychiatric services (therapy/medication management).  The suicide prevention education provided includes the following: Suicide risk factors Suicide prevention and interventions National Suicide Hotline telephone number [facility] assessment telephone number [facility] Emergency Assistance 911 [facility] and/or Residential Mobile Crisis Unit telephone number Request made of family/significant other t Remove weapons (e.g., guns, rifles, knives), all items previously/currently identified as safety concern.   Remove drugs/medications (over the counter, prescriptions, illicit drugs), all items previously/currently identified as a safety concern.           Same-day appointments Operating hours and clinician availability may delay appointments until the next business day. [facility] [place] and Current Patients  Psychiatry hours Monday-Friday, 8am-4:30pm (Eastern Time) If you are experiencing problems accessing [facility] send email to: [email or web address] or call [phone], Monday-Friday, 8:00am-4:30pm If you are having a psychiatric or  medical emergency, please call 911 or go to the nearest emergency department. To reach the Suicide and Crisis Lifeline, please call or text [phone].  What is [facility]?  [facility] is an online service that provides same-day access to psychiatry to meet urgent mental health needs. The goal is to provide patients with timely intervention and keep them in an outpatient setting.  How long will I wait to see a clinician?  Our goal is to provide same-day care. However, operating hours and clinician availability may delay appointments until the next business day.  What if I can't get an appointment?  Please call [facility] at [phone] 8am to 5pm [place] Time or email [place]:  [email or web address]  to request the next available time. If you are having a psychiatric or medical emergency, please call 911 or go to the nearest emergency department. To reach the Suicide and Crisis Lifeline, please call or text [phone]. Do you accept insurance?  Yes. We accept most commercial insurance plans. What information do you need from me? New patients should be ready to provide:  Photo ID  Insurance card  Payment information  For current patients, our specialists will confirm your documentation is on file.   Can I continue with regular care after my [facility] session?  Yes. Our goal is to make sure you have the follow-up care you need. Our specialist can help you schedule an appointment with an ongoing provider in addition to your [facility] Demand session.  Can my current clinician provide treatment on [facility]?  No. Our [facility] clinicians are trained to assist with more immediate needs and provide support between regular appointments with your clinician.  What device can I use to connect with [facility]?  You can  use any Wi-Fi-enabled device with a camera and a microphone, such as a smartphone, tablet, or computer.  Do I have to be at home to connect with [facility]?  No. As long as you are located within [place] or [place] you can connect with a provider. We do request that you connect from a safe and private location. Your clinician is required to document your location for emergency purposes.

## 2022-11-11 NOTE — ED Provider Notes (Signed)
[facility] Urgent Care Continuous Assessment Admission H&P  Date: 11/12/22 Patient Name: Shannon David MRN: [id number] Chief Complaint: not feeling myself,  having thoughts of hurting myself, which include cutting, driving my car into her wreck. Diagnoses:  Final diagnoses:  Suicidal thoughts  Anxious appearance  Recurrent major depressive disorder, remission status unspecified (HCC)    HPI: Shannon David,  32 y/o female with a history of major depressive disorder and RAD presented to [facility] accompanied by her husband.  Per patient I am not feeling myself, I have been having bad thoughts to hurt himself,  by driving my car into a wreck.   Per the patient she has been having suicidal thoughts x 1 week since her provider changed her Paxil.  A review of patient records show that patient was seen in the ED by psych today and discharge home.  However according to the patient has been after arriving at home patient went to to take a shower when she got out of the shower she looked really different and she stated she had thoughts of hurting herself.  Per the husband they reached out to the PCP and left them a message however they are still waiting to hear back from him.  According to the patient and her husband patient had started Cymbalta on Tuesday after stopping the Paxil.  Per the patient has been they do have guns in the home but they are now locked away.  Patient lives at home with her husband and her stepdaughters patient is gainfully employed.  Copied from triage notes: Shannon David, 32 y.o., female with a mental health history significant MDD and RA. She presents to the [facility], accompanied by her spouse Shannon David). Her complaint is reported as: "I've been having bad thoughts for almost one week now". Current SI (no plan/no intent). She denies any prior suicide attempts and has never been psychiatrically admitted for any mental health treatment. Her PCP recently changed her Paxil to Cymbalta with  goal of managing depressive symptoms and chronic pain related to RA. Per patient, PCP tapered her off the Paxil and started Cymbalta two weeks ago and then she began to experience the suicidal ideations. She presented to [facility] this evening with the same complaint, patient was assessed by a [facility] psychiatric provider, and discharged home with outpatient resources and medication changes. Her spouse states when she arrived home she told him that she wanted to cut herself. Therefore, her spouse decided to bring her to the [facility] for another psychiatric assessment. He believes the medications are making his wife suicidal and wants directive on how to help her.  Patient lives at home with spouse and step daughter  Face-to-face observation of patient, patient is alert and oriented x 4, speech is clear, maintaining eye contact.  Patient appearance is withdrawn and anxious affect is flat patient stated she had thoughts after going home this afternoon patient denies HI, AH VH or paranoia at this time.  Patient denies alcohol use or smoking, patient denies illicit drug use.  Writer discussed with patient the need for overnight observation and continuous evaluation in the a.m.  Patient and husband were given the opportunity to ask questions on the questions that were asked were answered. Patient does not seem to be influenced by external or internal stimuli at this time.  Recommend overnight observation.   Total Time spent with patient: 20 minutes  Musculoskeletal  Strength & Muscle Tone: within normal limits Gait & Station: normal Patient leans: N/A  Psychiatric Specialty  Exam  Presentation General Appearance:  Appropriate for Environment  Eye Contact: Good  Speech: Clear and Coherent  Speech Volume: Decreased  Handedness: Right   Mood and Affect  Mood: Anxious; Depressed  Affect: Depressed; Flat   Thought Process  Thought Processes: Coherent  Descriptions of  Associations:Circumstantial  Orientation:Full (Time, Place and Person)  Thought Content:Logical    Hallucinations:Hallucinations: None  Ideas of Reference:None  Suicidal Thoughts:Suicidal Thoughts: Yes, Passive SI Passive Intent and/or Plan: With Intent; With Plan  Homicidal Thoughts:Homicidal Thoughts: No   Sensorium  Memory: Immediate Good  Judgment: Good  Insight: Good   Executive Functions  Concentration: Good  Attention Span: Good  Recall: Good  Fund of Knowledge: Good  Language: Good   Psychomotor Activity  Psychomotor Activity: Psychomotor Activity: Decreased   Assets  Assets: Communication Skills   Sleep  Sleep: Sleep: Fair Number of Hours of Sleep: 6   Nutritional Assessment (For OBS and FBC admissions only) Has the patient had a weight loss or gain of 10 pounds or more in the last 3 months?: No Has the patient had a decrease in food intake/or appetite?: No Does the patient have dental problems?: No Does the patient have eating habits or behaviors that may be indicators of an eating disorder including binging or inducing vomiting?: No Has the patient recently lost weight without trying?: 0 Has the patient been eating poorly because of a decreased appetite?: 0 Malnutrition Screening Tool Score: 0    Physical Exam HENT:     Head: Normocephalic.     Nose: Nose normal.  Cardiovascular:     Rate and Rhythm: Normal rate.  Pulmonary:     Effort: Pulmonary effort is normal.  Musculoskeletal:        General: Normal range of motion.     Cervical back: Normal range of motion.  Neurological:     General: No focal deficit present.     Mental Status: She is alert.  Psychiatric:        Mood and Affect: Mood normal.        Behavior: Behavior normal.        Thought Content: Thought content normal.        Judgment: Judgment normal.    Review of Systems  Constitutional: Negative.   HENT: Negative.    Eyes: Negative.   Respiratory:  Negative.    Cardiovascular: Negative.   Gastrointestinal: Negative.   Genitourinary: Negative.   Musculoskeletal: Negative.   Skin: Negative.   Neurological: Negative.   Endo/Heme/Allergies: Negative.   Psychiatric/Behavioral:  Positive for depression and suicidal ideas. The patient is nervous/anxious.     Blood pressure 129/80, pulse 83, temperature 98.1 F (36.7 C), temperature source Tympanic, resp. rate 18, SpO2 98 %. There is no height or weight on file to calculate BMI.  Past Psychiatric History: Major depressive disorder,  Is the patient at risk to self? Yes  Has the patient been a risk to self in the past 6 months? Yes .    Has the patient been a risk to self within the distant past? Yes   Is the patient a risk to others? No   Has the patient been a risk to others in the past 6 months? No   Has the patient been a risk to others within the distant past? No   Past Medical History: See chart  Family History: Unknown  Social History: none  Last Labs:  Admission on 11/11/2022  Component Date Value Ref Range Status  Preg Test, Ur 11/12/2022 Negative  Negative Final   POC Amphetamine UR 11/12/2022 None Detected  NONE DETECTED (Cut Off Level 1000 ng/mL) Final   POC Secobarbital (BAR) 11/12/2022 None Detected  NONE DETECTED (Cut Off Level 300 ng/mL) Final   POC Buprenorphine (BUP) 11/12/2022 None Detected  NONE DETECTED (Cut Off Level 10 ng/mL) Final   POC Oxazepam (BZO) 11/12/2022 None Detected  NONE DETECTED (Cut Off Level 300 ng/mL) Final   POC Cocaine UR 11/12/2022 None Detected  NONE DETECTED (Cut Off Level 300 ng/mL) Final   POC Methamphetamine UR 11/12/2022 None Detected  NONE DETECTED (Cut Off Level 1000 ng/mL) Final   POC Morphine 11/12/2022 None Detected  NONE DETECTED (Cut Off Level 300 ng/mL) Final   POC Methadone UR 11/12/2022 None Detected  NONE DETECTED (Cut Off Level 300 ng/mL) Final   POC Oxycodone UR 11/12/2022 None Detected  NONE DETECTED (Cut Off Level  100 ng/mL) Final   POC Marijuana UR 11/12/2022 None Detected  NONE DETECTED (Cut Off Level 50 ng/mL) Final   Preg Test, Ur 11/12/2022 NEGATIVE  NEGATIVE Final   Comment:        THE SENSITIVITY OF THIS METHODOLOGY IS >24 mIU/mL   Admission on 11/11/2022, Discharged on 11/11/2022  Component Date Value Ref Range Status   Sodium 11/11/2022 135  135 - 145 mmol/L Final   Potassium 11/11/2022 3.8  3.5 - 5.1 mmol/L Final   Chloride 11/11/2022 103  98 - 111 mmol/L Final   CO2 11/11/2022 24  22 - 32 mmol/L Final   Glucose, Bld 11/11/2022 95  70 - 99 mg/dL Final   Glucose reference range applies only to samples taken after fasting for at least 8 hours.   BUN 11/11/2022 10  6 - 20 mg/dL Final   Creatinine, Ser 11/11/2022 0.64  0.44 - 1.00 mg/dL Final   Calcium 16/03/9603 8.6 (L)  8.9 - 10.3 mg/dL Final   Total Protein 54/02/8118 7.0  6.5 - 8.1 g/dL Final   Albumin 14/78/2956 3.6  3.5 - 5.0 g/dL Final   AST 21/30/8657 17  15 - 41 U/L Final   ALT 11/11/2022 19  0 - 44 U/L Final   Alkaline Phosphatase 11/11/2022 53  38 - 126 U/L Final   Total Bilirubin 11/11/2022 0.7  0.3 - 1.2 mg/dL Final   GFR, Estimated 11/11/2022 >60  >60 mL/min Final   Comment: (NOTE) Calculated using the CKD-EPI Creatinine Equation (2021)    Anion gap 11/11/2022 8  5 - 15 Final   Performed at [facility], [address], [place], [place] [address]   Alcohol, Ethyl (B) 11/11/2022 <10  <10 mg/dL Final   Comment: (NOTE) Lowest detectable limit for serum alcohol is 10 mg/dL.  For medical purposes only. Performed at [facility], [address] [place], [place] [address]    Salicylate Lvl 11/11/2022 <7.0 (L)  7.0 - 30.0 mg/dL Final   Performed at [facility], [address], [place], [place] [address]   Acetaminophen (Tylenol), Serum 11/11/2022 <10 (L)  10 - 30 ug/mL Final   Comment: (NOTE) Therapeutic concentrations vary significantly. A range of 10-30 ug/mL   may be an effective concentration for many patients. However, some  are best treated at concentrations outside of this range. Acetaminophen concentrations >150 ug/mL at 4 hours after ingestion  and >50 ug/mL at 12 hours after ingestion are often associated with  toxic reactions.  Performed at [facility], [address], [place], [place] [address]    WBC 11/11/2022 5.0  4.0 - 10.5 K/uL Final   RBC 11/11/2022 4.07  3.87 - 5.11 MIL/uL Final   Hemoglobin 11/11/2022 12.7  12.0 - 15.0 g/dL Final   HCT 60/45/4098 38.1  36.0 - 46.0 % Final   MCV 11/11/2022 93.6  80.0 - 100.0 fL Final   MCH 11/11/2022 31.2  26.0 - 34.0 pg Final   MCHC 11/11/2022 33.3  30.0 - 36.0 g/dL Final   RDW 11/91/4782 13.6  11.5 - 15.5 % Final   Platelets 11/11/2022 265  150 - 400 K/uL Final   nRBC 11/11/2022 0.0  0.0 - 0.2 % Final   Performed at [facility], [address], [place], [place] [address]   Opiates 11/11/2022 NONE DETECTED  NONE DETECTED Final   Cocaine 11/11/2022 NONE DETECTED  NONE DETECTED Final   Benzodiazepines 11/11/2022 NONE DETECTED  NONE DETECTED Final   Amphetamines 11/11/2022 NONE DETECTED  NONE DETECTED Final   Tetrahydrocannabinol 11/11/2022 NONE DETECTED  NONE DETECTED Final   Barbiturates 11/11/2022 NONE DETECTED  NONE DETECTED Final   Comment: (NOTE) DRUG SCREEN FOR MEDICAL PURPOSES ONLY.  IF CONFIRMATION IS NEEDED FOR ANY PURPOSE, NOTIFY LAB WITHIN 5 DAYS.  LOWEST DETECTABLE LIMITS FOR URINE DRUG SCREEN Drug Class                     Cutoff (ng/mL) Amphetamine and metabolites    1000 Barbiturate and metabolites    200 Benzodiazepine                 200 Opiates and metabolites        300 Cocaine and metabolites        300 THC                            50 Performed at [facility], [address] [place], [place] [address]    I-stat hCG, quantitative 11/11/2022 <5.0  <5 mIU/mL Final   Comment 3 11/11/2022           Final   Comment:   GEST. AGE      CONC.  (mIU/mL)   <=1 WEEK        5 - 50     2 WEEKS       50 - 500     3 WEEKS       100 - 10,000     4 WEEKS     1,000 - 30,000        FEMALE AND NON-PREGNANT FEMALE:     LESS THAN 5 mIU/mL    TSH 11/11/2022 5.062 (H)  0.350 - 4.500 uIU/mL Final   Comment: Performed by a 3rd Generation assay with a functional sensitivity of <=0.01 uIU/mL. Performed at [facility], [address], [place], [place] [address]   Office Visit on 10/18/2022  Component Date Value Ref Range Status   QuantiFERON-TB Gold Plus 10/18/2022 NEGATIVE  NEGATIVE Final   Comment: Negative test result. M. tuberculosis complex  infection unlikely.    NIL 10/18/2022 0.01  IU/mL Final   Mitogen-NIL 10/18/2022 8.94  IU/mL Final   TB1-NIL 10/18/2022 0.00  IU/mL Final   TB2-NIL 10/18/2022 0.00  IU/mL Final   Comment: . The Nil tube value reflects the background interferon gamma immune response of the patient's blood sample. This value has been subtracted from the patient's displayed  TB and Mitogen results. . Lower than expected results with the Mitogen tube prevent false-negative Quantiferon readings by detecting a patient with a potential immune suppressive condition and/or suboptimal pre-analytical specimen handling. . The TB1 Antigen tube is coated with the M. tuberculosis-specific antigens designed to elicit responses from TB antigen primed CD4+ helper T-lymphocytes. . The TB2 Antigen tube is coated with the M. tuberculosis-specific antigens designed to elicit responses from TB antigen primed CD4+ helper and CD8+ cytotoxic T-lymphocytes. . For additional information, please refer to https://education.[facility].com/faq/FAQ204 (This link is being provided for informational/ educational purposes only.) .    Cholesterol 10/18/2022 152  <200 mg/dL Final   HDL 16/03/9603 65  > OR = 50 mg/dL Final   Triglycerides 54/02/8118 54  <150 mg/dL Final    LDL Cholesterol (Calc) 10/18/2022 74  mg/dL (calc) Final   Comment: Reference range: <100 . Desirable range <100 mg/dL for primary prevention;   <70 mg/dL for patients with CHD or diabetic patients  with > or = 2 CHD risk factors. Marland Kitchen LDL-C is now calculated using the Martin-Hopkins  calculation, which is a validated novel method providing  better accuracy than the Friedewald equation in the  estimation of LDL-C.  Horald Pollen et al. Lenox Ahr. 1478;295(62): 2061-2068  (http://education.[facility].com/faq/FAQ164)    Total CHOL/HDL Ratio 10/18/2022 2.3  <1.3 (calc) Final   Non-HDL Cholesterol (Calc) 10/18/2022 87  <130 mg/dL (calc) Final   Comment: For patients with diabetes plus 1 major ASCVD risk  factor, treating to a non-HDL-C goal of <100 mg/dL  (LDL-C of <08 mg/dL) is considered a therapeutic  option.    WBC 10/18/2022 2.6 (L)  3.8 - 10.8 Thousand/uL Final   RBC 10/18/2022 4.24  3.80 - 5.10 Million/uL Final   Hemoglobin 10/18/2022 12.8  11.7 - 15.5 g/dL Final   HCT 65/78/4696 37.9  35.0 - 45.0 % Final   MCV 10/18/2022 89.4  80.0 - 100.0 fL Final   MCH 10/18/2022 30.2  27.0 - 33.0 pg Final   MCHC 10/18/2022 33.8  32.0 - 36.0 g/dL Final   RDW 29/52/8413 14.7  11.0 - 15.0 % Final   Platelets 10/18/2022 214  140 - 400 Thousand/uL Final   MPV 10/18/2022 11.0  7.5 - 12.5 fL Final   Neutro Abs 10/18/2022 996 (L)  1,500 - 7,800 cells/uL Final   Lymphs Abs 10/18/2022 1,079  850 - 3,900 cells/uL Final   Absolute Monocytes 10/18/2022 504  200 - 950 cells/uL Final   Eosinophils Absolute 10/18/2022 0 (L)  15 - 500 cells/uL Final   Basophils Absolute 10/18/2022 21  0 - 200 cells/uL Final   Neutrophils Relative % 10/18/2022 38.3  % Final   Total Lymphocyte 10/18/2022 41.5  % Final   Monocytes Relative 10/18/2022 19.4  % Final   Eosinophils Relative 10/18/2022 0.0  % Final   Basophils Relative 10/18/2022 0.8  % Final   Glucose, Bld 10/18/2022 93  65 - 99 mg/dL Final   Comment: .             Fasting reference interval .    BUN 10/18/2022 12  7 - 25 mg/dL Final   Creat 24/40/1027 0.69  0.50 - 0.97 mg/dL Final   eGFR 25/36/6440 117  > OR = 60 mL/min/1.45m2 Final   BUN/Creatinine Ratio 10/18/2022 SEE NOTE:  6 - 22 (calc) Final   Comment:    Not Reported: BUN and Creatinine are within    reference range. .    Sodium 10/18/2022 138  135 - 146 mmol/L Final   Potassium 10/18/2022 4.1  3.5 - 5.3 mmol/L Final   Chloride 10/18/2022 102  98 - 110 mmol/L Final   CO2 10/18/2022 29  20 - 32 mmol/L Final   Calcium 10/18/2022 9.3  8.6 - 10.2 mg/dL Final   Total Protein 16/03/9603 6.8  6.1 - 8.1 g/dL Final   Albumin 54/02/8118 4.5  3.6 - 5.1 g/dL Final   Globulin 14/78/2956 2.3  1.9 - 3.7 g/dL (calc) Final   AG Ratio 10/18/2022 2.0  1.0 - 2.5 (calc) Final   Total Bilirubin 10/18/2022 0.4  0.2 - 1.2 mg/dL Final   Alkaline phosphatase (APISO) 10/18/2022 62  31 - 125 U/L Final   AST 10/18/2022 20  10 - 30 U/L Final   ALT 10/18/2022 20  6 - 29 U/L Final  Orders Only on 07/22/2022  Component Date Value Ref Range Status   Glucose 07/22/2022 104 (H)  70 - 99 mg/dL Final   BUN 21/30/8657 13  6 - 20 mg/dL Final   Creatinine, Ser 07/22/2022 0.69  0.57 - 1.00 mg/dL Final   eGFR 84/69/6295 117  >59 mL/min/1.73 Final   BUN/Creatinine Ratio 07/22/2022 19  9 - 23 Final   Sodium 07/22/2022 138  134 - 144 mmol/L Final   Potassium 07/22/2022 3.9  3.5 - 5.2 mmol/L Final   Chloride 07/22/2022 103  96 - 106 mmol/L Final   CO2 07/22/2022 19 (L)  20 - 29 mmol/L Final   Calcium 07/22/2022 9.3  8.7 - 10.2 mg/dL Final   Total Protein 28/41/3244 7.0  6.0 - 8.5 g/dL Final   Albumin 06/30/7251 4.3  3.9 - 4.9 g/dL Final   Globulin, Total 07/22/2022 2.7  1.5 - 4.5 g/dL Final   Albumin/Globulin Ratio 07/22/2022 1.6  1.2 - 2.2 Final   Bilirubin Total 07/22/2022 0.3  0.0 - 1.2 mg/dL Final   Alkaline Phosphatase 07/22/2022 89  44 - 121 IU/L Final   AST 07/22/2022 14  0 - 40 IU/L Final   ALT 07/22/2022 15  0 -  32 IU/L Final   WBC 07/22/2022 6.9  3.4 - 10.8 x10E3/uL Final   RBC 07/22/2022 4.05  3.77 - 5.28 x10E6/uL Final   Hemoglobin 07/22/2022 12.6  11.1 - 15.9 g/dL Final   Hematocrit 66/44/0347 37.1  34.0 - 46.6 % Final   MCV 07/22/2022 92  79 - 97 fL Final   MCH 07/22/2022 31.1  26.6 - 33.0 pg Final   MCHC 07/22/2022 34.0  31.5 - 35.7 g/dL Final   RDW 42/59/5638 13.2  11.7 - 15.4 % Final   Platelets 07/22/2022 334  150 - 450 x10E3/uL Final   Neutrophils 07/22/2022 76  Not Estab. % Final   Lymphs 07/22/2022 14  Not Estab. % Final   Monocytes 07/22/2022 10  Not Estab. % Final   Eos 07/22/2022 0  Not Estab. % Final   Basos 07/22/2022 0  Not Estab. % Final   Neutrophils Absolute 07/22/2022 5.2  1.4 - 7.0 x10E3/uL Final   Lymphocytes Absolute 07/22/2022 0.9  0.7 - 3.1 x10E3/uL Final   Monocytes Absolute 07/22/2022 0.7  0.1 - 0.9 x10E3/uL Final   EOS (ABSOLUTE) 07/22/2022 0.0  0.0 - 0.4 x10E3/uL Final   Basophils Absolute 07/22/2022 0.0  0.0 - 0.2 x10E3/uL Final   Immature Granulocytes 07/22/2022 0  Not Estab. % Final   Immature Grans (Abs) 07/22/2022 0.0  0.0 - 0.1 x10E3/uL Final  Admission on 05/24/2022, Discharged on 05/24/2022  Component Date Value Ref Range Status   Sodium 05/24/2022 135  135 - 145 mmol/L Final   Potassium 05/24/2022 4.0  3.5 - 5.1 mmol/L Final   Chloride 05/24/2022 105  98 - 111 mmol/L Final   CO2 05/24/2022 22  22 - 32 mmol/L Final   Glucose, Bld 05/24/2022 91  70 - 99 mg/dL Final   Glucose reference range applies only to samples taken after fasting for at least 8 hours.   BUN 05/24/2022 13  6 - 20 mg/dL Final   Creatinine, Ser 05/24/2022 0.62  0.44 - 1.00 mg/dL Final   Calcium 08/65/7846 8.9  8.9 - 10.3 mg/dL Final   GFR, Estimated 05/24/2022 >60  >60 mL/min Final   Comment: (NOTE) Calculated using the CKD-EPI Creatinine Equation (2021)    Anion gap 05/24/2022 8  5 - 15 Final   Performed at [facility], [address] [place], [place] [address]   WBC  05/24/2022 8.3  4.0 - 10.5 K/uL Final   RBC 05/24/2022 4.12  3.87 - 5.11 MIL/uL Final   Hemoglobin 05/24/2022 12.9  12.0 - 15.0 g/dL Final   HCT 28/41/3244 38.2  36.0 - 46.0 % Final   MCV 05/24/2022 92.7  80.0 - 100.0 fL Final   MCH 05/24/2022 31.3  26.0 - 34.0 pg Final   MCHC 05/24/2022 33.8  30.0 - 36.0 g/dL Final   RDW 06/30/7251 13.3  11.5 - 15.5 % Final   Platelets 05/24/2022 266  150 - 400 K/uL Final   nRBC 05/24/2022 0.0  0.0 - 0.2 % Final   Performed at [facility], [address] [place], [place] [address]   Troponin I (High Sensitivity) 05/24/2022 <2  <18 ng/L Final   Comment: (NOTE) Elevated high sensitivity troponin I (hsTnI) values and significant  changes across serial measurements may suggest ACS but many other  chronic and acute conditions are known to elevate hsTnI results.  Refer to the "Links" section for chest pain algorithms and additional  guidance. Performed at [facility], [address] [place], [place] [address]    Preg Test, Ur 05/24/2022 NEGATIVE  NEGATIVE Final   Comment:        THE SENSITIVITY OF THIS METHODOLOGY IS >20 mIU/mL. Performed at [facility], [address] [place], [place] [address]    D-Dimer, Quant 05/24/2022 1.31 (H)  0.00 - 0.50 ug/mL-FEU Final   Comment: (NOTE) At the manufacturer cut-off value of 0.5 g/mL FEU, this assay has a negative predictive value of 95-100%.This assay is intended for use in conjunction with a clinical pretest probability (PTP) assessment model to exclude pulmonary embolism (PE) and deep venous thrombosis (DVT) in outpatients suspected of PE or DVT. Results should be correlated with clinical presentation. Performed at [facility], [address] [place], [place] [address]     Allergies: Keflex [cephalexin]  Medications:  Facility Ordered Medications  Medication   [COMPLETED] acetaminophen (TYLENOL) tablet 1,000 mg   acetaminophen (TYLENOL) tablet 650  mg   alum & mag hydroxide-simeth (MAALOX/MYLANTA) 200-200-20 MG/5ML suspension 30 mL   magnesium hydroxide (MILK OF MAGNESIA) suspension 30 mL   OLANZapine zydis (ZYPREXA) disintegrating tablet 5 mg   And   LORazepam (ATIVAN) tablet 1 mg   And   ziprasidone (GEODON) injection 20 mg   gabapentin (NEURONTIN) capsule 300 mg   levothyroxine (SYNTHROID) tablet 100 mcg   methotrexate (RHEUMATREX) tablet 10 mg  ondansetron (ZOFRAN) tablet 4 mg   PARoxetine (PAXIL) tablet 20 mg   albuterol (VENTOLIN HFA) 108 (90 Base) MCG/ACT inhaler 1-2 puff   Vitamin D3 2,000 Units   folic acid (FOLVITE) tablet 2 mg   PTA Medications  Medication Sig   ibuprofen (ADVIL) 800 MG tablet Take 1 tablet (800 mg total) by mouth every 8 (eight) hours as needed. For mild pain and inflammation while you are off your rheumatologic meds (Patient not taking: Reported on 11/11/2022)   promethazine (PHENERGAN) 12.5 MG tablet Take 1 tablet (12.5 mg total) by mouth every 6 (six) hours as needed for nausea or vomiting. (Patient not taking: Reported on 11/11/2022)   Cholecalciferol (VITAMIN D3) 50 MCG (2000 UT) capsule Take 2,000 Units by mouth daily.   gabapentin (NEURONTIN) 300 MG capsule Take 300 mg by mouth 3 (three) times daily as needed (for neuropathy).   ondansetron (ZOFRAN) 4 MG tablet Take 1 tablet (4 mg total) by mouth as needed. (Patient taking differently: Take 4 mg by mouth See admin instructions. Take 4 mg by mouth before each dose of Methotrexate tablets taken on Wednesdays- and an additional 4 mg once a day as needed for nausea and/or vomiting)   albuterol (VENTOLIN HFA) 108 (90 Base) MCG/ACT inhaler Inhale 1-2 puffs into the lungs every 6 (six) hours as needed for wheezing or shortness of breath.   hydroxychloroquine (PLAQUENIL) 200 MG tablet TAKE 1 TABLET BY MOUTH TWICE A DAY MONDAY-FRIDAY (Patient taking differently: Take 400 mg by mouth every Monday, Tuesday, Wednesday, Thursday, and Friday.)   methotrexate  (RHEUMATREX) 2.5 MG tablet TAKE 8 TABLETS (20 MG TOTAL) BY MOUTH ONCE A WEEK. CAUTION:CHEMOTHERAPY. PROTECT FROM LIGHT. (Patient taking differently: Take 10 mg by mouth See admin instructions. Take 10 mg by mouth in the morning AND evening every Wednesday. CAUTION:CHEMOTHERAPY. PROTECT FROM LIGHT.)   folic acid (FOLVITE) 1 MG tablet Take 2 tablets (2 mg total) by mouth daily.   predniSONE (DELTASONE) 5 MG tablet Take 4 tabs po x 7 days, 3  tabs po x 7 days, 2  tabs po x 7 days, 1  tab po x 7 days (Patient not taking: Reported on 10/18/2022)   traMADol (ULTRAM) 50 MG tablet Take 50 mg by mouth 3 (three) times daily as needed (for pain).   Tocilizumab (ACTEMRA) 80 MG/4ML SOLN injection Infuse 4mg /kg every 4 weeks (weight 117.4kg on 10/18/22). Deliver to infusion center (Patient not taking: Reported on 11/11/2022)   naloxone (NARCAN) nasal spray 4 mg/0.1 mL Place 1 spray into the nose once as needed (AS DIRECTED).      Medical Decision Making  Inpatient observation   Meds ordered this encounter  Medications   acetaminophen (TYLENOL) tablet 650 mg   alum & mag hydroxide-simeth (MAALOX/MYLANTA) 200-200-20 MG/5ML suspension 30 mL   magnesium hydroxide (MILK OF MAGNESIA) suspension 30 mL   AND Linked Order Group    OLANZapine zydis (ZYPREXA) disintegrating tablet 5 mg    LORazepam (ATIVAN) tablet 1 mg    ziprasidone (GEODON) injection 20 mg   gabapentin (NEURONTIN) capsule 300 mg   levothyroxine (SYNTHROID) tablet 100 mcg   methotrexate (RHEUMATREX) tablet 10 mg   ondansetron (ZOFRAN) tablet 4 mg   PARoxetine (PAXIL) tablet 20 mg   albuterol (VENTOLIN HFA) 108 (90 Base) MCG/ACT inhaler 1-2 puff   Vitamin D3 2,000 Units   folic acid (FOLVITE) tablet 2 mg    Lab Orders         SARS Coronavirus 2 by RT  PCR (hospital order, performed in [facility] hospital lab) *cepheid single result test* Anterior Nasal Swab         POC urine preg, ED         POCT Urine Drug Screen - (I-Screen)          Pregnancy, urine POC      Recommendations  Based on my evaluation the patient appears to have an emergency medical condition for which I recommend the patient be transferred to the emergency department for further evaluation.  Sindy Guadeloupe, NP 11/12/22  6:05 AM

## 2022-11-11 NOTE — ED Notes (Addendum)
Pt dressed out in purple scrubs. Both pt and husband wanded by security. Pt's belongings were given to husband who put them in their vehicle.

## 2022-11-12 ENCOUNTER — Other Ambulatory Visit: Payer: Self-pay

## 2022-11-12 ENCOUNTER — Telehealth: Payer: Self-pay | Admitting: *Deleted

## 2022-11-12 DIAGNOSIS — R339 Retention of urine, unspecified: Secondary | ICD-10-CM

## 2022-11-12 DIAGNOSIS — R45851 Suicidal ideations: Secondary | ICD-10-CM

## 2022-11-12 DIAGNOSIS — F339 Major depressive disorder, recurrent, unspecified: Secondary | ICD-10-CM | POA: Diagnosis not present

## 2022-11-12 LAB — POCT URINE DRUG SCREEN - MANUAL ENTRY (I-SCREEN)
POC Amphetamine UR: NOT DETECTED
POC Buprenorphine (BUP): NOT DETECTED
POC Cocaine UR: NOT DETECTED
POC Marijuana UR: NOT DETECTED
POC Methadone UR: NOT DETECTED
POC Methamphetamine UR: NOT DETECTED
POC Morphine: NOT DETECTED
POC Oxazepam (BZO): NOT DETECTED
POC Oxycodone UR: NOT DETECTED
POC Secobarbital (BAR): NOT DETECTED

## 2022-11-12 LAB — POC URINE PREG, ED: Preg Test, Ur: NEGATIVE

## 2022-11-12 LAB — POCT PREGNANCY, URINE: Preg Test, Ur: NEGATIVE

## 2022-11-12 MED ORDER — PAROXETINE HCL 20 MG PO TABS: 20.0000 mg | ORAL_TABLET | Freq: Every day | ORAL | 0 refills | Status: DC

## 2022-11-12 MED ORDER — VITAMIN D 25 MCG (1000 UNIT) PO TABS
2000.0000 [IU] | ORAL_TABLET | Freq: Every day | ORAL | Status: DC
  Filled 2022-11-12: qty 2

## 2022-11-12 MED ORDER — GABAPENTIN 300 MG PO CAPS: 300.0000 mg | ORAL_CAPSULE | Freq: Three times a day (TID) | ORAL | Status: DC | PRN

## 2022-11-12 MED ORDER — PAROXETINE HCL 20 MG PO TABS
20.0000 mg | ORAL_TABLET | Freq: Every day | ORAL | Status: DC
  Filled 2022-11-12: qty 1

## 2022-11-12 MED ORDER — LEVOTHYROXINE SODIUM 100 MCG PO TABS
100.0000 ug | ORAL_TABLET | Freq: Every day | ORAL | Status: DC
  Administered 2022-11-12: 100 ug via ORAL
  Filled 2022-11-12: qty 1

## 2022-11-12 MED ORDER — METHOTREXATE SODIUM 2.5 MG PO TABS: 10.0000 mg | ORAL_TABLET | ORAL | Status: DC

## 2022-11-12 MED ORDER — ONDANSETRON HCL 4 MG PO TABS: 4.0000 mg | ORAL_TABLET | ORAL | Status: DC

## 2022-11-12 MED ORDER — ALBUTEROL SULFATE HFA 108 (90 BASE) MCG/ACT IN AERS: 1.0000 | INHALATION_SPRAY | Freq: Four times a day (QID) | RESPIRATORY_TRACT | Status: DC | PRN

## 2022-11-12 MED ORDER — FOLIC ACID 1 MG PO TABS
2.0000 mg | ORAL_TABLET | Freq: Every day | ORAL | Status: DC
  Filled 2022-11-12: qty 2

## 2022-11-12 NOTE — Telephone Encounter (Signed)
[facility] calling regarding patient's Actemra RX. Please advise.

## 2022-11-12 NOTE — Discharge Instructions (Addendum)
Discharge recommendations:   Medications: Patient is to take medications as prescribed. The patient or patient's guardian is to contact a medical professional and/or outpatient provider to address any new side effects that develop. The patient or the patient's guardian should update outpatient providers of any new medications and/or medication changes.   Stop taking the Cymbalta.   Continue taking Paxil 20 mg by mouth daily for depression. Follow up with outpatient psychiatry or your PCP for medication adjustments.   Outpatient Follow up: Please review list of outpatient resources for psychiatry and counseling. Please follow up with your primary care provider for all medical related needs.   Therapy: We recommend that patient participate in individual therapy to address mental health concerns.  Safety:   The following safety precautions should be taken:   No sharp objects. This includes scissors, razors, scrapers, and putty knives.   Chemicals should be removed and locked up.   Medications should be removed and locked up.   Weapons should be removed and locked up. This includes firearms, knives and instruments that can be used to cause injury.   The patient should abstain from use of illicit substances/drugs and abuse of any medications.  If symptoms worsen or do not continue to improve or if the patient becomes actively suicidal or homicidal then it is recommended that the patient return to the closest hospital emergency department, the [facility], or call 911 for further evaluation and treatment. National Suicide Prevention Lifeline 1-800-SUICIDE or (302)728-9340.  About 988 988 offers 24/7 access to trained crisis counselors who can help people experiencing mental health-related distress. People can call or text 988 or chat 988lifeline.org for themselves or if they are worried about a loved one who may need crisis support.   Please contact one of the  following facilities to start medication management and therapy services:   [facility] at [facility] [address] [place] [address]  [place], [place] [address] [phone]   [facility]  [address] [place], [place] [address] [phone]  [facility] [place]  [address] [place], [place] [address] [phone]  [facility]  [address]  [place], [place] [address] [phone]  [facility]  [address] [place], [place] [address] [phone]  [facility]  [address],  [place], [place] [address] [phone]

## 2022-11-12 NOTE — ED Notes (Signed)
Patient refused moring medication. Patient A&O x 4, ambulatory. Patient discharged in no acute distress. Patient denied SI/HI, A/VH upon discharge. Patient verbalized understanding of all discharge instructions explained by staff, to include follow up appointments, RX's and safety plan. Patient reported mood 10/10.  Pt belongings returned to patient from locker #   31 intact. Patient escorted to lobby via staff for transport to destination. Safety maintained.

## 2022-11-12 NOTE — ED Provider Notes (Signed)
FBC/OBS ASAP Discharge Summary  Date and Time: 11/12/2022 8:36 AM  Name: Shannon David  MRN:  [id number]   Discharge Diagnoses:  Final diagnoses:  Suicidal thoughts  Recurrent major depressive disorder, remission status unspecified (HCC)    Subjective: Shannon David is a 32 year old female patient with a past psychiatric history significant for MDD. Patient seen and evaluated face-to-face provided, chart reviewed and case discussed with Dr. Lucianne Muss. On evaluation today, patient is lying down in bed in no acute discuss. She is alert and oriented x 4. Her thought process is linear and speech is clear and coherent at a moderate tone. Her mood is euthymic and affect is appropriate. Today, patient denies suicidal ideations. She reports last feeling suicidal prior to coming in for an evaluation. She denies past suicide attempts. She contracts for safety and verbalizes readiness to return home today. She denies homicidal ideations. She denies auditory or visual hallucinations. There is no objective evidence that the patient is currently responding to internal or external stimuli on exam. She did denies depressive symptoms. She reports fair appetite. She reports fair sleep. She denies current stressors or triggers that could have attributed to her suicidal ideations and believe that the suicidal ideations are a result of her and starting the Cymbalta. I discussed with the patient that typically Cymbalta will excrete from the body in 3-4 days and should begin to see a significant improvement in her symptoms. Patient advised to continue taking the Paxil for depression and follow up with outpatient psychiatry or her PCP for medication adjustments. Patient currently receiving mediation management by her PCP and is awaiting new patient services with outpatient psychiatry. Patient denies access to firearms in the home. She states that her husband removed the firearms from the home. Safety planning completed with the  patient.   With the patient's consent I contacted her husband Joe to safety plan and confirm that the firearm has been removed and locked away. Joe states that he removed the firearms from the home and took them to his uncle house. He denies safety concerns with the patient returning home today, he states that he is not worried about her doing anything to hurt herself. He was advised that if the patient's symptoms worsen or if she expresses active suicidal ideations with a plan to bring her to the [facility] behavioral health urgent care, nearest emergency department or call the behavioral health response team for evaluation. He was informed that the patient was advised to follow-up with outpatient psychiatry and therapy to address depressive symptoms and mental health concerns.  Stay Summary: Per Triage Note: "Shannon David, 32 y.o., female with a mental health history significant MDD and RA. She presents to the [facility], accompanied by her spouse Shannon David). Her complaint is reported as: "I've been having bad thoughts for almost one week now". Current SI (no plan/no intent). She denies any prior suicide attempts and has never been psychiatrically admitted for any mental health treatment. Her PCP recently changed her Paxil to Cymbalta with goal of managing depressive symptoms and chronic pain related to RA. Per patient, PCP tapered her off the Paxil and started Cymbalta two weeks ago and then she began to experience the suicidal ideations. She presented to [facility] this evening with the same complaint, patient was assessed by a [facility] psychiatric provider, and discharged home with outpatient resources and medication changes. Her spouse states when she arrived home she told him that she wanted to cut herself. Therefore, her spouse decided to bring her  to the [facility] for another psychiatric assessment. He believes the medications are making his wife suicidal and wants directive on how to help her. Patient lives at  home with spouse and step daughter."   Total Time spent with patient: 30 minutes  Past Psychiatric History: History of MDD.   Past Medical History: History of RA.   Family History: No reported history.  Family Psychiatric History: No reported history.  Social History: Patient lives at home with spouse and step daughter. Patient works full-time in a [employer]. No drug use.   Tobacco Cessation:  N/A, patient does not currently use tobacco products  Current Medications:  Current Facility-Administered Medications  Medication Dose Route Frequency Provider Last Rate Last Admin   acetaminophen (TYLENOL) tablet 650 mg  650 mg Oral Q6H PRN Sindy Guadeloupe, NP       albuterol (VENTOLIN HFA) 108 (90 Base) MCG/ACT inhaler 1-2 puff  1-2 puff Inhalation Q6H PRN Sindy Guadeloupe, NP       alum & mag hydroxide-simeth (MAALOX/MYLANTA) 200-200-20 MG/5ML suspension 30 mL  30 mL Oral Q4H PRN Sindy Guadeloupe, NP       cholecalciferol (VITAMIN D3) 25 MCG (1000 UNIT) tablet 2,000 Units  2,000 Units Oral Daily Sindy Guadeloupe, NP       folic acid (FOLVITE) tablet 2 mg  2 mg Oral Daily Sindy Guadeloupe, NP       gabapentin (NEURONTIN) capsule 300 mg  300 mg Oral TID PRN Sindy Guadeloupe, NP       levothyroxine (SYNTHROID) tablet 100 mcg  100 mcg Oral Q0600 Sindy Guadeloupe, NP   100 mcg at 11/12/22 0623   OLANZapine zydis (ZYPREXA) disintegrating tablet 5 mg  5 mg Oral Q8H PRN Sindy Guadeloupe, NP       And   LORazepam (ATIVAN) tablet 1 mg  1 mg Oral PRN Sindy Guadeloupe, NP       And   ziprasidone (GEODON) injection 20 mg  20 mg Intramuscular PRN Sindy Guadeloupe, NP       magnesium hydroxide (MILK OF MAGNESIA) suspension 30 mL  30 mL Oral Daily PRN Sindy Guadeloupe, NP       Melene Muller ON 11/17/2022] methotrexate (RHEUMATREX) tablet 10 mg  10 mg Oral Q Zenovia Jarred, Channing Mutters, NP       Melene Muller ON 11/17/2022] ondansetron [facility]) tablet 4 mg  4 mg Oral Q Thressa Sheller, NP       PARoxetine (PAXIL) tablet 20 mg  20 mg Oral Daily  Sindy Guadeloupe, NP       Current Outpatient Medications  Medication Sig Dispense Refill   albuterol (VENTOLIN HFA) 108 (90 Base) MCG/ACT inhaler Inhale 1-2 puffs into the lungs every 6 (six) hours as needed for wheezing or shortness of breath. 18 g 0   Cholecalciferol (VITAMIN D3) 50 MCG (2000 UT) capsule Take 2,000 Units by mouth daily.     folic acid (FOLVITE) 1 MG tablet Take 2 tablets (2 mg total) by mouth daily. 180 tablet 3   gabapentin (NEURONTIN) 300 MG capsule Take 300 mg by mouth 3 (three) times daily as needed (for neuropathy).     hydroxychloroquine (PLAQUENIL) 200 MG tablet TAKE 1 TABLET BY MOUTH TWICE A DAY MONDAY-FRIDAY (Patient taking differently: Take 200 mg by mouth every Monday, Tuesday, Wednesday, Thursday, and Friday.) 40 tablet 2   ibuprofen (IBU-200) 200 MG tablet Take 400 mg by mouth every 6 (six) hours as needed for mild pain or headache.     levothyroxine (SYNTHROID) 100  MCG tablet Take 100 mcg by mouth daily before breakfast.     methotrexate (RHEUMATREX) 2.5 MG tablet TAKE 8 TABLETS (20 MG TOTAL) BY MOUTH ONCE A WEEK. CAUTION:CHEMOTHERAPY. PROTECT FROM LIGHT. (Patient taking differently: Take 10 mg by mouth See admin instructions. Take 10 mg by mouth in the morning AND evening every Wednesday. CAUTION:CHEMOTHERAPY. PROTECT FROM LIGHT.) 64 tablet 0   naloxone (NARCAN) nasal spray 4 mg/0.1 mL Place 1 spray into the nose once as needed (AS DIRECTED).     ondansetron (ZOFRAN) 4 MG tablet Take 1 tablet (4 mg total) by mouth as needed. (Patient taking differently: Take 4 mg by mouth See admin instructions. Take 4 mg by mouth before each dose of Methotrexate tablets taken on Wednesdays- and an additional 4 mg once a day as needed for nausea and/or vomiting) 30 tablet 1   PARoxetine (PAXIL) 20 MG tablet Take 1 tablet (20 mg total) by mouth daily for 14 days. 14 tablet 0   traMADol (ULTRAM) 50 MG tablet Take 50 mg by mouth 3 (three) times daily as needed (for pain).      PTA  Medications:  Facility Ordered Medications  Medication   [COMPLETED] acetaminophen (TYLENOL) tablet 1,000 mg   acetaminophen (TYLENOL) tablet 650 mg   alum & mag hydroxide-simeth (MAALOX/MYLANTA) 200-200-20 MG/5ML suspension 30 mL   magnesium hydroxide (MILK OF MAGNESIA) suspension 30 mL   OLANZapine zydis (ZYPREXA) disintegrating tablet 5 mg   And   LORazepam (ATIVAN) tablet 1 mg   And   ziprasidone (GEODON) injection 20 mg   gabapentin (NEURONTIN) capsule 300 mg   levothyroxine (SYNTHROID) tablet 100 mcg   [START ON 11/17/2022] methotrexate (RHEUMATREX) tablet 10 mg   [START ON 11/17/2022] ondansetron (ZOFRAN) tablet 4 mg   PARoxetine (PAXIL) tablet 20 mg   albuterol (VENTOLIN HFA) 108 (90 Base) MCG/ACT inhaler 1-2 puff   cholecalciferol (VITAMIN D3) 25 MCG (1000 UNIT) tablet 2,000 Units   folic acid (FOLVITE) tablet 2 mg   PTA Medications  Medication Sig   Cholecalciferol (VITAMIN D3) 50 MCG (2000 UT) capsule Take 2,000 Units by mouth daily.   gabapentin (NEURONTIN) 300 MG capsule Take 300 mg by mouth 3 (three) times daily as needed (for neuropathy).   ondansetron (ZOFRAN) 4 MG tablet Take 1 tablet (4 mg total) by mouth as needed. (Patient taking differently: Take 4 mg by mouth See admin instructions. Take 4 mg by mouth before each dose of Methotrexate tablets taken on Wednesdays- and an additional 4 mg once a day as needed for nausea and/or vomiting)   albuterol (VENTOLIN HFA) 108 (90 Base) MCG/ACT inhaler Inhale 1-2 puffs into the lungs every 6 (six) hours as needed for wheezing or shortness of breath.   hydroxychloroquine (PLAQUENIL) 200 MG tablet TAKE 1 TABLET BY MOUTH TWICE A DAY MONDAY-FRIDAY (Patient taking differently: Take 200 mg by mouth every Monday, Tuesday, Wednesday, Thursday, and Friday.)   methotrexate (RHEUMATREX) 2.5 MG tablet TAKE 8 TABLETS (20 MG TOTAL) BY MOUTH ONCE A WEEK. CAUTION:CHEMOTHERAPY. PROTECT FROM LIGHT. (Patient taking differently: Take 10 mg by mouth See  admin instructions. Take 10 mg by mouth in the morning AND evening every Wednesday. CAUTION:CHEMOTHERAPY. PROTECT FROM LIGHT.)   folic acid (FOLVITE) 1 MG tablet Take 2 tablets (2 mg total) by mouth daily.   traMADol (ULTRAM) 50 MG tablet Take 50 mg by mouth 3 (three) times daily as needed (for pain).   naloxone (NARCAN) nasal spray 4 mg/0.1 mL Place 1 spray into the nose  once as needed (AS DIRECTED).   PARoxetine (PAXIL) 20 MG tablet Take 1 tablet (20 mg total) by mouth daily for 14 days.        No data to display          Flowsheet Row ED from 11/11/2022 in [facility] Most recent reading at 11/12/2022  1:47 AM ED from 11/11/2022 in [facility] at [facility] Most recent reading at 11/11/2022 12:18 PM  C-SSRS RISK CATEGORY High Risk Low Risk       Musculoskeletal  Strength & Muscle Tone: within normal limits Gait & Station: normal Patient leans: N/A  Psychiatric Specialty Exam  Presentation  General Appearance:  Appropriate for Environment  Eye Contact: Fair  Speech: Clear and Coherent  Speech Volume: Normal  Handedness: Right   Mood and Affect  Mood: Euthymic  Affect: Appropriate   Thought Process  Thought Processes: Coherent  Descriptions of Associations:Intact  Orientation:Full (Time, Place and Person)  Thought Content:Logical  Diagnosis of Schizophrenia or Schizoaffective disorder in past: No    Hallucinations:Hallucinations: None  Ideas of Reference:None  Suicidal Thoughts:Suicidal Thoughts: No SI Passive Intent and/or Plan: With Intent; With Plan  Homicidal Thoughts:Homicidal Thoughts: No   Sensorium  Memory: Immediate Fair; Recent Fair; Remote Fair  Judgment: Fair  Insight: Fair   Chartered certified accountant: Fair  Attention Span: Fair  Recall: [employer] of Knowledge: Fair  Language: Fair   Psychomotor Activity  Psychomotor  Activity: Psychomotor Activity: Normal   Assets  Assets: Manufacturing systems engineer; Desire for Improvement; Financial Resources/Insurance; Housing; Intimacy; Leisure Time; Physical Health; Social Support; Vocational/Educational   Sleep  Sleep: Sleep: Fair Number of Hours of Sleep: 6   Nutritional Assessment (For OBS and FBC admissions only) Has the patient had a weight loss or gain of 10 pounds or more in the last 3 months?: No Has the patient had a decrease in food intake/or appetite?: No Does the patient have dental problems?: No Does the patient have eating habits or behaviors that may be indicators of an eating disorder including binging or inducing vomiting?: No Has the patient recently lost weight without trying?: 0 Has the patient been eating poorly because of a decreased appetite?: 0 Malnutrition Screening Tool Score: 0    Physical Exam  Physical Exam HENT:     Head: Normocephalic.     Nose: Nose normal.  Eyes:     Conjunctiva/sclera: Conjunctivae normal.  Cardiovascular:     Rate and Rhythm: Normal rate.  Pulmonary:     Effort: Pulmonary effort is normal.  Musculoskeletal:        General: Normal range of motion.     Cervical back: Normal range of motion.  Neurological:     Mental Status: She is alert and oriented to person, place, and time.    Review of Systems  Constitutional: Negative.   HENT: Negative.    Eyes: Negative.   Respiratory: Negative.    Cardiovascular: Negative.   Gastrointestinal: Negative.   Genitourinary: Negative.   Musculoskeletal: Negative.   Neurological: Negative.   Endo/Heme/Allergies: Negative.    Blood pressure (!) 116/59, pulse 82, temperature 98.2 F (36.8 C), temperature source Oral, resp. rate 18, SpO2 98 %. There is no height or weight on file to calculate BMI.  Demographic Factors:  Caucasian  Loss Factors: NA  Historical Factors: NA  Risk Reduction Factors:   Sense of responsibility to family, Employed, Living  with another person, especially a relative,  Positive social support, and Positive coping skills or problem solving skills  Continued Clinical Symptoms:  Previous Psychiatric Diagnoses and Treatments  Cognitive Features That Contribute To Risk:  None    Suicide Risk:  Minimal: No identifiable suicidal ideation.  Patients presenting with no risk factors but with morbid ruminations; may be classified as minimal risk based on the severity of the depressive symptoms  Plan Of Care/Follow-up recommendations:   Medications: Patient is to take medications as prescribed. The patient or patient's guardian is to contact a medical professional and/or outpatient provider to address any new side effects that develop. The patient or the patient's guardian should update outpatient providers of any new medications and/or medication changes.   -Stop taking the Cymbalta.   -Continue taking Paxil 20 mg by mouth daily for depression. Paxil 20 mg po daily x 14 days sent to the patient's pharmacy one file. Patient advised to follow up with outpatient psychiatry or PCP for medication adjustments.   Outpatient Follow up: Please review list of outpatient resources for psychiatry and counseling. Please follow up with your primary care provider for all medical related needs.   Therapy: We recommend that patient participate in individual therapy to address mental health concerns.  Safety:   The following safety precautions should be taken:   No sharp objects. This includes scissors, razors, scrapers, and putty knives.   Chemicals should be removed and locked up.   Medications should be removed and locked up.   Weapons should be removed and locked up. This includes firearms, knives and instruments that can be used to cause injury.   The patient should abstain from use of illicit substances/drugs and abuse of any medications.  If symptoms worsen or do not continue to improve or if the patient becomes actively  suicidal or homicidal then it is recommended that the patient return to the closest hospital emergency department, the [facility], or call 911 for further evaluation and treatment. National Suicide Prevention Lifeline 1-800-SUICIDE or 209-370-2595.  About 988 988 offers 24/7 access to trained crisis counselors who can help people experiencing mental health-related distress. People can call or text 988 or chat 988lifeline.org for themselves or if they are worried about a loved one who may need crisis support.   Please contact one of the following facilities to start medication management and therapy services:   [facility] [address] [place] [address]  [place], [place] [address] [phone]   [facility]  [address] [place], [place] [address] [phone]  [facility] [place]  [address] [place], [place] [address] [phone]  [facility]  [address]  [place], [place] [address] [phone]  [facility]  [address] [place], [place] [address] [phone]  [facility]  [address],  [place], [place] [address] [phone]  Disposition: Discharge home. Patient's husband Gabriel Rung will pick her up today around 9:30 am.   Although this patient presented with SI she does not appear to be at imminent risk of dangerousness to self and dangerousness to others at this time. While future psychiatric events cannot be accurately predicted, the patient does not necessitate nor desire further acute inpatient psychiatric care at this time. This patient presents with risk factors that include adverse reaction to starting Cymbalta. These are mitigated by protective factors which include lack of active SI/HI, no  history of previous suicide attempts , no history of violence, sense of responsibility to family  and social supports, presence of an available support system, employment, enjoyment of leisure actvities, expresses purpose for living, and effective problem solving skills.  Layla Barter, NP 11/12/2022, 8:36 AM

## 2022-11-12 NOTE — ED Notes (Signed)
Pt sleeping@this time. Breathing even and unlabored. Will continue to monitor for safety 

## 2022-11-12 NOTE — ED Notes (Signed)
Pt A&O x 4, presents with depression and suicidal thoughts, feeling to cut herself.  Denies HI or AVH.  Monitoring for safety.

## 2022-11-12 NOTE — ED Notes (Signed)
Patient alert and oriented x 3. Denies SI/HI/AVH. Denies intent or plan to harm self or others. Routine conducted according to faculty protocol. Encourage patient to notify staff with any needs or concerns. Patient verbalized agreement and understanding. Will continue to monitor for safety. 
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
## 2022-11-16 NOTE — Progress Notes (Signed)
Actemra infusion schedled for 11/30/22

## 2022-11-20 ENCOUNTER — Other Ambulatory Visit: Payer: Self-pay | Admitting: Physician Assistant

## 2022-11-23 NOTE — Telephone Encounter (Signed)
Last Fill: 04/05/2022  Next Visit: 12/20/2022  Last Visit: 10/18/2022  Dx: Rheumatoid arthritis involving multiple sites with positive rheumatoid factor -   Current Dose per office note on 4/22/204: not mentioned  Okay to refill Zofran?

## 2022-11-30 ENCOUNTER — Ambulatory Visit (INDEPENDENT_AMBULATORY_CARE_PROVIDER_SITE_OTHER): Payer: BC Managed Care – PPO

## 2022-11-30 VITALS — BP 119/80 | HR 78 | Temp 98.1°F | Resp 18 | Ht 62.0 in | Wt 261.6 lb

## 2022-11-30 DIAGNOSIS — Z79899 Other long term (current) drug therapy: Secondary | ICD-10-CM | POA: Diagnosis not present

## 2022-11-30 DIAGNOSIS — M0579 Rheumatoid arthritis with rheumatoid factor of multiple sites without organ or systems involvement: Secondary | ICD-10-CM | POA: Diagnosis not present

## 2022-11-30 MED ORDER — TOCILIZUMAB 400 MG/20ML IV SOLN
4.0000 mg/kg | Freq: Once | INTRAVENOUS | Status: AC
  Administered 2022-11-30: 474 mg via INTRAVENOUS
  Filled 2022-11-30: qty 23.7

## 2022-11-30 MED ORDER — DIPHENHYDRAMINE HCL 25 MG PO CAPS
25.0000 mg | ORAL_CAPSULE | Freq: Once | ORAL | Status: AC
  Administered 2022-11-30: 25 mg via ORAL
  Filled 2022-11-30: qty 1

## 2022-11-30 MED ORDER — ACETAMINOPHEN 325 MG PO TABS
650.0000 mg | ORAL_TABLET | Freq: Once | ORAL | Status: AC
  Administered 2022-11-30: 650 mg via ORAL
  Filled 2022-11-30: qty 2

## 2022-11-30 NOTE — Patient Instructions (Signed)
Tocilizumab Injection What is this medication? TOCILIZUMAB (TOE si LIZ ue mab) treats autoimmune conditions, such as arthritis. It works by slowing down an overactive immune system. It may also be used to treat severe COVID-19 in people who are hospitalized. It is a monoclonal antibody. This medicine may be used for other purposes; ask your health care provider or pharmacist if you have questions. COMMON BRAND NAME(S): Actemra What should I tell my care team before I take this medication? They need to know if you have any of these conditions: Cancer Diabetes Heart disease History of or current hepatitis B infection High blood pressure High cholesterol Immune system problems Infection Liver disease Low blood counts, such as low white cell, platelet, or red cell counts Multiple sclerosis Recent or upcoming vaccine Stomach or intestine problems Stroke An unusual or allergic reaction to tocilizumab, other medications, foods, dyes, or preservatives Pregnant or trying to get pregnant Breast-feeding How should I use this medication? This medication is injected into a vein or under the skin. It may be given by your care team in a hospital or clinic setting. It may also be given at home. If you get this medication at home, you will be taught how to prepare and give it. Use it exactly as directed. Take it as directed on the prescription label. Keep taking it unless your care team tells you to stop. If you use a pen, be sure to take off the outer needle cover before using the dose. It is important that you put your used needles and syringes in a special sharps container. Do not put them in a trash can. If you do not have a sharps container, call your pharmacist or care team to get one. A special MedGuide will be given to you by the pharmacist with each prescription and refill. Be sure to read this information carefully each time. Talk to your care team about the use of this medication in children.  While it may be prescribed for children as young as 2 years for selected conditions, precautions do apply. Overdosage: If you think you have taken too much of this medicine contact a poison control center or emergency room at once. NOTE: This medicine is only for you. Do not share this medicine with others. What if I miss a dose? If you get this medication at the hospital or clinic: It is important not to miss your dose. Call your care team if you are unable to keep an appointment. If you give yourself this medication at home: If you miss a dose, take it as soon as you can. If it is almost time for your next dose, take only that dose. Do not take double or extra doses. Call your care team with questions. What may interact with this medication? Do not take this medication with any of the following: Live virus vaccines This medication may also interact with the following: Biologic medications, such as abatacept, adalimumab, anakinra, certolizumab, etanercept, golimumab, infliximab, rituximab, secukinumab, ustekinumab Certain medications for cholesterol, such as atorvastatin, lovastatin, simvastatin Cyclosporine Estrogen or progestin hormones Omeprazole Steroid medications, such as prednisone or cortisone Theophylline Vaccines Warfarin This list may not describe all possible interactions. Give your health care provider a list of all the medicines, herbs, non-prescription drugs, or dietary supplements you use. Also tell them if you smoke, drink alcohol, or use illegal drugs. Some items may interact with your medicine. What should I watch for while using this medication? Visit your care team for regular checks on  your progress. Your condition will be monitored carefully while you are receiving this medication. Tell your care team if your symptoms do not start to get better or if they get worse. You may need blood work done while taking this medication. You will be tested for tuberculosis (TB) before  you start this medication. If your care team prescribes any medication for TB, you should start taking the TB medication before starting this medication. Make sure to finish the full course of TB medication. This medication may increase your risk of getting an infection. Call your care team for advice if you get a fever, chills, sore throat, or other symptoms of a cold or flu. Do not treat yourself. Try to avoid being around people who are sick. Talk to your care team about your risk of cancer. You may be more at risk for certain types of cancers if you take this medication. What side effects may I notice from receiving this medication? Side effects that you should report to your care team as soon as possible: Allergic reactions--skin rash, itching, hives, swelling of the face, lips, tongue, or throat Infection--fever, chills, cough, sore throat, wounds that don't heal, pain or trouble when passing urine, general feeling of discomfort or being unwell Liver injury--right upper belly pain, loss of appetite, nausea, light-colored stool, dark yellow or brown urine, yellowing skin or eyes, unusual weakness or fatigue Stomach pain that is severe, does not go away, or gets worse Unusual bruising or bleeding Side effects that usually do not require medical attention (report to your care team if they continue or are bothersome): Dizziness Headache Increase in blood pressure Pain, redness, or irritation at injection site Runny or stuffy nose Sore throat Stomach pain This list may not describe all possible side effects. Call your doctor for medical advice about side effects. You may report side effects to FDA at 1-800-FDA-1088. Where should I keep my medication? Keep out of the reach of children and pets. You will be instructed on how to store this medication. Get rid of any unused medication after the expiration date on the label. To get rid of medications that are no longer needed or have expired: Take  the medication to a medication take-back program. Check with your pharmacy or law enforcement to find a location. If you cannot return the medication, ask your pharmacist or care team how to get rid of this medication safely. NOTE: This sheet is a summary. It may not cover all possible information. If you have questions about this medicine, talk to your doctor, pharmacist, or health care provider.  2024 Elsevier/Gold Standard (2021-09-23 00:00:00)

## 2022-11-30 NOTE — Progress Notes (Signed)
Diagnosis: Rheumatoid Arthritis  Provider:  Chilton Greathouse MD  Procedure: IV Infusion  IV Type: Peripheral, IV Location: L Antecubital  Actemra (Tocilizumab), Dose: 474mg   Infusion Start Time: 0935  Infusion Stop Time: 1037  Post Infusion IV Care: Observation period completed and Peripheral IV Discontinued  Discharge: Condition: Good, Destination: Home . AVS Provided  Performed by:  Garnette Czech, RN

## 2022-12-01 ENCOUNTER — Telehealth: Payer: Self-pay | Admitting: *Deleted

## 2022-12-01 NOTE — Telephone Encounter (Signed)
FMLA paperwork completed. Patient advised paperwork is completed and ready for pick up. Paperwork placed at front desk for patient.

## 2022-12-06 ENCOUNTER — Ambulatory Visit (INDEPENDENT_AMBULATORY_CARE_PROVIDER_SITE_OTHER): Payer: Self-pay | Admitting: Adult Health

## 2022-12-06 DIAGNOSIS — Z0389 Encounter for observation for other suspected diseases and conditions ruled out: Secondary | ICD-10-CM

## 2022-12-06 NOTE — Progress Notes (Signed)
Patient no show appointment. ? ?

## 2022-12-06 NOTE — Progress Notes (Signed)
Office Visit Note  Patient: Shannon David             Date of Birth: Jan 13, 1991           MRN: [id number]             PCP: Lars Mage, NP Referring: Lars Mage, NP Visit Date: 12/20/2022 Occupation: @GUAROCC @  Subjective:  Medication monitoring   History of Present Illness: Shannon David is a 32 y.o. female with history of seropositive rheumatoid arthritis.  Patient is currently prescribed IV Actemra, Plaquenil 200 mg 1 tablet BID Monday through Friday (restarted 10/2021), Methotrexate 8 tablets by mouth once wkly, and folic acid 2 mg daily.  Her first IV actemra dose was administered on 11/30/22.  She tolerated the infusion without any side effects.  Patient continues to experience intermittent flares.  Over the past 2 weeks she has been having increased pain involving multiple joints.  Her pain was severe on Saturday to the point that she had to take 2 Tylenol's for relief.  She remains on gabapentin and tramadol as prescribed by pain management.  Patient states that she recently had a left foot fracture.  She is currently in a postop shoe.  She states that she does not require surgical intervention.  Patient states that she had to discontinue Cymbalta and was switched back to Paxil due to increased depression.  She is currently under the care of her primary care but is waiting for consultation with psychiatry.  Patient states that her mood has improved since resuming Paxil.  Activities of Daily Living:  Patient reports morning stiffness for all day. Patient Reports nocturnal pain.  Difficulty dressing/grooming: Reports Difficulty climbing stairs: Reports Difficulty getting out of chair: Reports Difficulty using hands for taps, buttons, cutlery, and/or writing: Reports  Review of Systems  Constitutional:  Positive for fatigue.  HENT:  Negative for mouth sores and mouth dryness.   Eyes:  Negative for dryness.  Respiratory:  Negative for shortness of breath.   Cardiovascular:   Negative for chest pain and palpitations.  Gastrointestinal:  Negative for blood in stool, constipation and diarrhea.  Endocrine: Negative for increased urination.  Genitourinary:  Negative for involuntary urination.  Musculoskeletal:  Positive for joint pain, gait problem, joint pain, joint swelling, myalgias, morning stiffness, muscle tenderness and myalgias. Negative for muscle weakness.  Skin:  Positive for sensitivity to sunlight. Negative for color change, rash and hair loss.  Allergic/Immunologic: Positive for susceptible to infections.  Neurological:  Negative for dizziness and headaches.  Hematological:  Negative for swollen glands.  Psychiatric/Behavioral:  Positive for depressed mood and sleep disturbance. The patient is nervous/anxious.     PMFS History:  Patient Active Problem List   Diagnosis Date Noted   Passive suicidal ideations 11/11/2022   Major depressive disorder, recurrent episode (HCC) 11/11/2022   Pain in left knee 06/04/2021   History of PCOS 02/21/2017   High risk medication use 09/21/2016   Rheumatoid arthritis involving multiple sites with positive rheumatoid factor (HCC) 09/21/2016   History of hypothyroidism 09/21/2016   History of depression 09/21/2016    Past Medical History:  Diagnosis Date   Arthritis    COVID    History of depression    History of hypothyroidism    History of PCOS    Rheumatoid arthritis (HCC)     Family History  Problem Relation Age of Onset   Hypertension Mother    Clotting disorder Mother    Rheum arthritis Brother  Lung cancer Paternal Uncle    Cancer Maternal Grandmother        lung   Past Surgical History:  Procedure Laterality Date   BUNIONECTOMY Right    FOOT SURGERY Right 08/24/2021   Social History   Social History Narrative   Left Handed    Lives in a one story home   Drinks Caffeine    Immunization History  Administered Date(s) Administered   PFIZER(Purple Top)SARS-COV-2 Vaccination 10/23/2021      Objective: Vital Signs: BP 115/77 (BP Location: Left Arm, Patient Position: Sitting, Cuff Size: Large)   Pulse 76   Resp 17   Ht 5\' 2"  (1.575 m)   Wt 265 lb 6.4 oz (120.4 kg)   BMI 48.54 kg/m    Physical Exam Vitals and nursing note reviewed.  Constitutional:      Appearance: She is well-developed.  HENT:     Head: Normocephalic and atraumatic.  Eyes:     Conjunctiva/sclera: Conjunctivae normal.  Cardiovascular:     Rate and Rhythm: Normal rate and regular rhythm.     Heart sounds: Normal heart sounds.  Pulmonary:     Effort: Pulmonary effort is normal.     Breath sounds: Normal breath sounds.  Abdominal:     General: Bowel sounds are normal.     Palpations: Abdomen is soft.  Musculoskeletal:     Cervical back: Normal range of motion.  Lymphadenopathy:     Cervical: No cervical adenopathy.  Skin:    General: Skin is warm and dry.     Capillary Refill: Capillary refill takes less than 2 seconds.  Neurological:     Mental Status: She is alert and oriented to person, place, and time.  Psychiatric:        Behavior: Behavior normal.      Musculoskeletal Exam: C-spine, thoracic spine, lumbar spine have good range of motion.  No midline spinal tenderness.  Shoulder joints have good range of motion with no discomfort.  Elbow joints have good range of motion with no tenderness or inflammation along the joint line.  Limited range of motion of both wrist joints.  Thickening of all MCP joints with ulnar deviation.  Tenderness of the right second MCP joint.  Hip joints have good range of motion with no groin pain.  Knee joints have good range of motion with warmth in the left knee.  Ankle joints have good range of motion without joint tenderness.  Left foot is in a postop shoe.  CDAI Exam: CDAI Score: -- Patient Global: 50 / 100; Provider Global: 50 / 100 Swollen: --; Tender: -- Joint Exam 12/20/2022   No joint exam has been documented for this visit   There is currently  no information documented on the homunculus. Go to the Rheumatology activity and complete the homunculus joint exam.  Investigation: No additional findings.  Imaging: No results found.  Recent Labs: Lab Results  Component Value Date   WBC 3.0 (L) 12/14/2022   HGB 13.7 12/14/2022   PLT 210 12/14/2022   NA 137 12/14/2022   K 4.4 12/14/2022   CL 101 12/14/2022   CO2 23 12/14/2022   GLUCOSE 92 12/14/2022   BUN 8 12/14/2022   CREATININE 0.68 12/14/2022   BILITOT 0.5 12/14/2022   ALKPHOS 82 12/14/2022   AST 29 12/14/2022   ALT 50 (H) 12/14/2022   PROT 6.6 12/14/2022   ALBUMIN 4.3 12/14/2022   CALCIUM 9.2 12/14/2022   GFRAA 134 07/18/2020   QFTBGOLD Negative 08/13/2016  [facility] NEGATIVE 10/18/2022    Speciality Comments: PLQ Eye Exam: 10/19/2022 WNL @ [facility] Follow up in 12 months.   Prior therapy: Orencia (inadequate response 9/18-9/19), Enbrel (inadequate response 3/15/5/15), Humira (inadequate response 11/17-3/18), and injectable methotrexate (non-compliance)  Procedures:  No procedures performed Allergies: Keflex [cephalexin]   Assessment / Plan:     Visit Diagnoses: Rheumatoid arthritis involving multiple sites with positive rheumatoid factor (HCC) - Severe, erosive rheumatoid arthritis involving multiple joints: Patient continues to experience recurrent flares.  Her most recent flare on side today involved her left shoulder, left hand, and left knee.  She took 2 doses of Tylenol on Saturday and has been taking tramadol as needed for symptomatic relief.  She is currently on IV Actemra, Plaquenil 200 mg 1 tablet by mouth twice daily Monday to Friday, methotrexate tablets by mouth once weekly.  Her first dose of IV Actemra was administered on 11/30/2022.  She tolerated the infusion without any side effects.  She has not had any recent or recurrent infections.  She has not yet noticed any clinical benefit since switching to IV Actemra.  She is willing to give the  combination more time and for [place] to reassess for the full efficacy in 2 to 3 months.  She remains under the care of pain management and was encouraged to avoid the use of Tylenol and NSAIDs. She had to discontinue Cymbalta due to worsening depression and suicidal ideation.  She has resumed Paxil as prescribed and will be establishing care with psychiatry.  Her mood has gradually improved since switching back to Paxil. She will require close lab monitoring given low white blood cell count and elevated ALT on 12/14/2022.  Plan on updating CBC and CMP with her next infusion on 12/28/2022.  She will follow-up in the office in 2 to 3 months to reassess her response.  High risk medication use - IV Actemra-first infusion administered on 11/30/2022, Plaquenil 200 mg 1 tablet BID Monday through Friday (restarted 10/2021), Methotrexate 8 tablets by mouth once wkly, and folic acid 2 mg daily. CBC and CMP updated on 12/14/22. ALT 50. WBC count low-3.0.  She will be getting lab work with infusions.  Plan to have updated lab work with her infusion scheduled on 12/28/2022. Advised patient to avoid tylenol, NSAID, and alcohol use while taking methotrexate.  TB gold negative on 10/18/22.  Lipid panel WNL on 10/18/22.  No recent or recurrent infections.  Discussed the importance of holding Actemra and Methotrexate if she develops signs or symptoms of an infection and to resume once the infection has completely cleared.   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  Patient had a flare in the left hand this past weekend which has since subsided.  She will remain on triple therapy as discussed above.  Trigger finger, right ring finger: Resolved.   Primary osteoarthritis of both knees: Patient has good range of motion of both knee joints on examination today.  Warmth in the left knee was noted.  No knee joint effusion noted.  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 Marylene Land.  She underwent right foot reconstructive surgery on 08/24/2021 which was successful.  Chronic pain in both feet.  Patient had a left foot fracture recently and is in a postop shoe.  According to the patient she will be having monthly x-rays to assess for routine healing.  Other medical conditions  are listed as follows:  History of depression - Discontinued cymbalta-increased depression/SI.  Resumed paxil.  Under care of PCP and will be establishing care with psychiatry.   History of hypothyroidism  History of pneumonia  History of PCOS  Orders: No orders of the defined types were placed in this encounter.  No orders of the defined types were placed in this encounter.    Follow-Up Instructions: Return in about 3 months (around 03/22/2023) for Rheumatoid arthritis.   Gearldine Bienenstock, PA-C  Note - This record has been created using Dragon software.  Chart creation errors have been sought, but may not always  have been located. Such creation errors do not reflect on  the standard of medical care.

## 2022-12-07 ENCOUNTER — Other Ambulatory Visit: Payer: Self-pay | Admitting: Physician Assistant

## 2022-12-07 DIAGNOSIS — M0579 Rheumatoid arthritis with rheumatoid factor of multiple sites without organ or systems involvement: Secondary | ICD-10-CM

## 2022-12-07 DIAGNOSIS — Z79899 Other long term (current) drug therapy: Secondary | ICD-10-CM

## 2022-12-07 NOTE — Telephone Encounter (Signed)
Last Fill: 09/02/2022  Eye exam: 10/19/2022 WNL    Labs: 10/18/2022 CMP WNL WBC count is low-2.6. Absolute neutrophils and eosinophils are low.    Next Visit: 12/20/2022  Last Visit: 10/18/2022  DX: Rheumatoid arthritis involving multiple sites with positive rheumatoid factor   Current Dose per office note 10/18/2022: Plaquenil 200 mg 1 tablet BID Monday through Friday   Okay to refill Plaquenil?

## 2022-12-14 ENCOUNTER — Telehealth: Payer: Self-pay | Admitting: Physician Assistant

## 2022-12-14 DIAGNOSIS — Z79899 Other long term (current) drug therapy: Secondary | ICD-10-CM

## 2022-12-14 NOTE — Telephone Encounter (Signed)
Lab Orders released.  

## 2022-12-14 NOTE — Telephone Encounter (Signed)
Pt requesting labs sent over to [facility] in [place] on [address] [facility] [address] [facility].

## 2022-12-15 LAB — CMP14+EGFR
ALT: 50 IU/L — ABNORMAL HIGH (ref 0–32)
AST: 29 IU/L (ref 0–40)
Albumin: 4.3 g/dL (ref 3.9–4.9)
Alkaline Phosphatase: 82 IU/L (ref 44–121)
BUN/Creatinine Ratio: 12 (ref 9–23)
BUN: 8 mg/dL (ref 6–20)
Bilirubin Total: 0.5 mg/dL (ref 0.0–1.2)
CO2: 23 mmol/L (ref 20–29)
Calcium: 9.2 mg/dL (ref 8.7–10.2)
Chloride: 101 mmol/L (ref 96–106)
Creatinine, Ser: 0.68 mg/dL (ref 0.57–1.00)
Globulin, Total: 2.3 g/dL (ref 1.5–4.5)
Glucose: 92 mg/dL (ref 70–99)
Potassium: 4.4 mmol/L (ref 3.5–5.2)
Sodium: 137 mmol/L (ref 134–144)
Total Protein: 6.6 g/dL (ref 6.0–8.5)
eGFR: 118 mL/min/{1.73_m2} (ref >59)

## 2022-12-15 LAB — CBC WITH DIFFERENTIAL/PLATELET
Basophils Absolute: 0 10*3/uL (ref 0.0–0.2)
Basos: 1 %
EOS (ABSOLUTE): 0 10*3/uL (ref 0.0–0.4)
Eos: 0 %
Hematocrit: 40.4 % (ref 34.0–46.6)
Hemoglobin: 13.7 g/dL (ref 11.1–15.9)
Immature Grans (Abs): 0 10*3/uL (ref 0.0–0.1)
Immature Granulocytes: 0 %
Lymphocytes Absolute: 0.9 10*3/uL (ref 0.7–3.1)
Lymphs: 29 %
MCH: 31.5 pg (ref 26.6–33.0)
MCHC: 33.9 g/dL (ref 31.5–35.7)
MCV: 93 fL (ref 79–97)
Monocytes Absolute: 0.4 10*3/uL (ref 0.1–0.9)
Monocytes: 12 %
Neutrophils Absolute: 1.7 10*3/uL (ref 1.4–7.0)
Neutrophils: 58 %
Platelets: 210 10*3/uL (ref 150–450)
RBC: 4.35 x10E6/uL (ref 3.77–5.28)
RDW: 12.7 % (ref 11.7–15.4)
WBC: 3 10*3/uL — ABNORMAL LOW (ref 3.4–10.8)

## 2022-12-20 ENCOUNTER — Other Ambulatory Visit: Payer: Self-pay | Admitting: Pharmacist

## 2022-12-20 ENCOUNTER — Encounter: Payer: Self-pay | Admitting: Physician Assistant

## 2022-12-20 ENCOUNTER — Ambulatory Visit: Payer: BC Managed Care – PPO | Attending: Physician Assistant | Admitting: Physician Assistant

## 2022-12-20 VITALS — BP 115/77 | HR 76 | Resp 17 | Ht 62.0 in | Wt 265.4 lb

## 2022-12-20 DIAGNOSIS — M0579 Rheumatoid arthritis with rheumatoid factor of multiple sites without organ or systems involvement: Secondary | ICD-10-CM | POA: Diagnosis not present

## 2022-12-20 DIAGNOSIS — Z8659 Personal history of other mental and behavioral disorders: Secondary | ICD-10-CM

## 2022-12-20 DIAGNOSIS — M65341 Trigger finger, right ring finger: Secondary | ICD-10-CM

## 2022-12-20 DIAGNOSIS — M069 Rheumatoid arthritis, unspecified: Secondary | ICD-10-CM

## 2022-12-20 DIAGNOSIS — Z8701 Personal history of pneumonia (recurrent): Secondary | ICD-10-CM

## 2022-12-20 DIAGNOSIS — Z79899 Other long term (current) drug therapy: Secondary | ICD-10-CM | POA: Diagnosis not present

## 2022-12-20 DIAGNOSIS — M170 Bilateral primary osteoarthritis of knee: Secondary | ICD-10-CM

## 2022-12-20 DIAGNOSIS — M21962 Unspecified acquired deformity of left lower leg: Secondary | ICD-10-CM

## 2022-12-20 DIAGNOSIS — Z8742 Personal history of other diseases of the female genital tract: Secondary | ICD-10-CM

## 2022-12-20 DIAGNOSIS — M21941 Unspecified acquired deformity of hand, right hand: Secondary | ICD-10-CM | POA: Diagnosis not present

## 2022-12-20 DIAGNOSIS — M21961 Unspecified acquired deformity of right lower leg: Secondary | ICD-10-CM

## 2022-12-20 DIAGNOSIS — M21942 Unspecified acquired deformity of hand, left hand: Secondary | ICD-10-CM

## 2022-12-20 DIAGNOSIS — Z8639 Personal history of other endocrine, nutritional and metabolic disease: Secondary | ICD-10-CM

## 2022-12-20 NOTE — Progress Notes (Signed)
Yes, we have added it to her visit notes and will be sure to redraw.

## 2022-12-20 NOTE — Progress Notes (Signed)
Per Sherron Ales, PA-C, would like CBC and CMET labs redrawn with upcoming Actemra infusion at [facility] on 12/28/22. Routing to infusion center staff   Chesley Mires, PharmD, MPH, BCPS, CPP Clinical Pharmacist (Rheumatology and Pulmonology)

## 2022-12-23 ENCOUNTER — Ambulatory Visit (HOSPITAL_COMMUNITY): Payer: BC Managed Care – PPO | Admitting: Clinical

## 2022-12-23 ENCOUNTER — Encounter (HOSPITAL_COMMUNITY): Payer: Self-pay | Admitting: Clinical

## 2022-12-23 ENCOUNTER — Encounter (HOSPITAL_COMMUNITY): Payer: Self-pay

## 2022-12-23 DIAGNOSIS — F331 Major depressive disorder, recurrent, moderate: Secondary | ICD-10-CM
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
## 2022-12-27 ENCOUNTER — Other Ambulatory Visit: Payer: Self-pay | Admitting: Physician Assistant

## 2022-12-27 DIAGNOSIS — M0579 Rheumatoid arthritis with rheumatoid factor of multiple sites without organ or systems involvement: Secondary | ICD-10-CM

## 2022-12-27 DIAGNOSIS — Z79899 Other long term (current) drug therapy: Secondary | ICD-10-CM

## 2022-12-27 NOTE — Telephone Encounter (Signed)
Last Fill: 09/02/2022  Labs: 09/13/2022 WBC 3.0  Next Visit: 03/22/2023  Last Visit: 12/20/2022  DX: Rheumatoid arthritis involving multiple sites with positive rheumatoid factor   Current Dose per office note 12/20/2022: Methotrexate 8 tablets by mouth once wkly   Okay to refill Methotrexate?

## 2022-12-28 ENCOUNTER — Ambulatory Visit (INDEPENDENT_AMBULATORY_CARE_PROVIDER_SITE_OTHER): Payer: BC Managed Care – PPO

## 2022-12-28 ENCOUNTER — Telehealth: Payer: Self-pay | Admitting: *Deleted

## 2022-12-28 ENCOUNTER — Other Ambulatory Visit (INDEPENDENT_AMBULATORY_CARE_PROVIDER_SITE_OTHER): Payer: BC Managed Care – PPO

## 2022-12-28 VITALS — BP 103/69 | HR 78 | Temp 98.3°F | Resp 18 | Ht 62.0 in | Wt 263.4 lb

## 2022-12-28 DIAGNOSIS — Z79899 Other long term (current) drug therapy: Secondary | ICD-10-CM

## 2022-12-28 DIAGNOSIS — M0579 Rheumatoid arthritis with rheumatoid factor of multiple sites without organ or systems involvement: Secondary | ICD-10-CM

## 2022-12-28 LAB — COMPREHENSIVE METABOLIC PANEL
ALT: 21 U/L (ref 0–35)
AST: 19 U/L (ref 0–37)
Albumin: 4.2 g/dL (ref 3.5–5.2)
Alkaline Phosphatase: 68 U/L (ref 39–117)
BUN: 10 mg/dL (ref 6–23)
CO2: 24 mEq/L (ref 19–32)
Calcium: 9.2 mg/dL (ref 8.4–10.5)
Chloride: 103 mEq/L (ref 96–112)
Creatinine, Ser: 0.62 mg/dL (ref 0.40–1.20)
GFR: 116.57 mL/min (ref >60.00)
Glucose, Bld: 112 mg/dL — ABNORMAL HIGH (ref 70–99)
Potassium: 3.4 mEq/L — ABNORMAL LOW (ref 3.5–5.1)
Sodium: 134 mEq/L — ABNORMAL LOW (ref 135–145)
Total Bilirubin: 0.7 mg/dL (ref 0.2–1.2)
Total Protein: 7.3 g/dL (ref 6.0–8.3)

## 2022-12-28 LAB — CBC WITH DIFFERENTIAL/PLATELET
Basophils Absolute: 0 10*3/uL (ref 0.0–0.1)
Basophils Relative: 0.6 % (ref 0.0–3.0)
Eosinophils Absolute: 0 10*3/uL (ref 0.0–0.7)
Eosinophils Relative: 0.7 % (ref 0.0–5.0)
HCT: 38 % (ref 36.0–46.0)
Hemoglobin: 12.9 g/dL (ref 12.0–15.0)
Lymphocytes Relative: 15 % (ref 12.0–46.0)
Lymphs Abs: 0.7 10*3/uL (ref 0.7–4.0)
MCHC: 34 g/dL (ref 30.0–36.0)
MCV: 93.7 fl (ref 78.0–100.0)
Monocytes Absolute: 0.6 10*3/uL (ref 0.1–1.0)
Monocytes Relative: 12.8 % — ABNORMAL HIGH (ref 3.0–12.0)
Neutro Abs: 3.5 10*3/uL (ref 1.4–7.7)
Neutrophils Relative %: 70.9 % (ref 43.0–77.0)
Platelets: 257 10*3/uL (ref 150.0–400.0)
RBC: 4.06 Mil/uL (ref 3.87–5.11)
RDW: 13.6 % (ref 11.5–15.5)
WBC: 4.9 10*3/uL (ref 4.0–10.5)

## 2022-12-28 LAB — LIPID PANEL
Cholesterol: 151 mg/dL (ref 0–200)
HDL: 48.5 mg/dL (ref >39.00)
LDL Cholesterol: 89 mg/dL (ref 0–99)
NonHDL: 102.34
Total CHOL/HDL Ratio: 3
Triglycerides: 65 mg/dL (ref 0.0–149.0)
VLDL: 13 mg/dL (ref 0.0–40.0)

## 2022-12-28 MED ORDER — ACETAMINOPHEN 325 MG PO TABS
650.0000 mg | ORAL_TABLET | Freq: Once | ORAL | Status: AC
  Administered 2022-12-28: 650 mg via ORAL
  Filled 2022-12-28: qty 2

## 2022-12-28 MED ORDER — TOCILIZUMAB 400 MG/20ML IV SOLN
4.0000 mg/kg | Freq: Once | INTRAVENOUS | Status: AC
  Administered 2022-12-28: 482 mg via INTRAVENOUS
  Filled 2022-12-28 (×3): qty 24.1

## 2022-12-28 MED ORDER — DIPHENHYDRAMINE HCL 25 MG PO CAPS
25.0000 mg | ORAL_CAPSULE | Freq: Once | ORAL | Status: AC
  Administered 2022-12-28: 25 mg via ORAL
  Filled 2022-12-28: qty 1

## 2022-12-28 NOTE — Progress Notes (Signed)
Diagnosis: Rheumatoid Arthritis  Provider:  Chilton Greathouse MD  Procedure: IV Infusion  IV Type: Peripheral, IV Location: L Antecubital  Actemra (Tocilizumab), Dose: 482 mg  Infusion Start Time: 0949  Infusion Stop Time: 1100  Post Infusion IV Care: Peripheral IV Discontinued  Discharge: Condition: Good, Destination: Home . AVS Declined  Performed by:  Loney Hering, LPN

## 2022-12-28 NOTE — Telephone Encounter (Signed)
Shannon David from infusion center called to advise patient was seen for her infusion today and had her labs drawn today. Her labs are in her chart for review.

## 2022-12-28 NOTE — Telephone Encounter (Signed)
Patient advised White blood cell count has returned to within normal limits.  LFTs have returned to within normal limits.  Lipid panel was within normal limits. Potassium was borderline low. Patient denies any recent diarrhea or vomiting. We will continue to monitor lab work closely.

## 2022-12-28 NOTE — Telephone Encounter (Signed)
White blood cell count has returned to within normal limits.  LFTs have returned to within normal limits.  Lipid panel was within normal limits. Potassium was borderline low. Any recent diarrhea or vomiting? We will continue to monitor lab work closely.

## 2023-01-19 ENCOUNTER — Ambulatory Visit (INDEPENDENT_AMBULATORY_CARE_PROVIDER_SITE_OTHER): Payer: BC Managed Care – PPO | Admitting: Clinical

## 2023-01-19 ENCOUNTER — Encounter (HOSPITAL_COMMUNITY): Payer: Self-pay | Admitting: Clinical

## 2023-01-19 DIAGNOSIS — F331 Major depressive disorder, recurrent, moderate: Secondary | ICD-10-CM | POA: Diagnosis not present

## 2023-01-19 DIAGNOSIS — F419 Anxiety disorder, unspecified: Secondary | ICD-10-CM | POA: Diagnosis not present
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
## 2023-01-24 ENCOUNTER — Ambulatory Visit (HOSPITAL_COMMUNITY): Payer: BC Managed Care – PPO | Admitting: Student

## 2023-01-24 ENCOUNTER — Other Ambulatory Visit (HOSPITAL_COMMUNITY): Payer: Self-pay | Admitting: Student

## 2023-01-24 VITALS — BP 136/82 | HR 95 | Ht 62.0 in | Wt 270.4 lb

## 2023-01-24 DIAGNOSIS — F0631 Mood disorder due to known physiological condition with depressive features: Secondary | ICD-10-CM

## 2023-01-24 DIAGNOSIS — F331 Major depressive disorder, recurrent, moderate: Secondary | ICD-10-CM | POA: Diagnosis not present

## 2023-01-24 MED ORDER — PAROXETINE HCL 40 MG PO TABS: 40.0000 mg | ORAL_TABLET | Freq: Every day | ORAL | 1 refills | Status: DC

## 2023-01-24 MED ORDER — TRAZODONE HCL 50 MG PO TABS: 50.0000 mg | ORAL_TABLET | Freq: Every evening | ORAL | 1 refills | Status: DC | PRN

## 2023-01-24 MED ORDER — PAROXETINE HCL 20 MG PO TABS: 20.0000 mg | ORAL_TABLET | Freq: Every day | ORAL | 1 refills | Status: DC
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
## 2023-01-25 ENCOUNTER — Encounter (HOSPITAL_COMMUNITY): Payer: Self-pay | Admitting: Student

## 2023-01-25 DIAGNOSIS — F0631 Mood disorder due to known physiological condition with depressive features: Secondary | ICD-10-CM | POA: Insufficient documentation

## 2023-01-26 NOTE — Addendum Note (Signed)
Addended by: Everlena Cooper on: 01/26/2023 09:30 AM   Modules accepted: Level of Service

## 2023-01-27 DIAGNOSIS — Z8619 Personal history of other infectious and parasitic diseases: Secondary | ICD-10-CM

## 2023-01-27 HISTORY — DX: Personal history of other infectious and parasitic diseases: Z86.19

## 2023-02-01 ENCOUNTER — Ambulatory Visit (INDEPENDENT_AMBULATORY_CARE_PROVIDER_SITE_OTHER): Payer: BC Managed Care – PPO

## 2023-02-01 ENCOUNTER — Other Ambulatory Visit: Payer: Self-pay | Admitting: Physician Assistant

## 2023-02-01 VITALS — BP 117/78 | HR 80 | Temp 98.3°F | Resp 18 | Ht 62.0 in | Wt 262.0 lb

## 2023-02-01 DIAGNOSIS — Z79899 Other long term (current) drug therapy: Secondary | ICD-10-CM

## 2023-02-01 DIAGNOSIS — M0579 Rheumatoid arthritis with rheumatoid factor of multiple sites without organ or systems involvement: Secondary | ICD-10-CM

## 2023-02-01 MED ORDER — DIPHENHYDRAMINE HCL 25 MG PO CAPS
25.0000 mg | ORAL_CAPSULE | Freq: Once | ORAL | Status: AC
  Administered 2023-02-01: 25 mg via ORAL
  Filled 2023-02-01: qty 1

## 2023-02-01 MED ORDER — TOCILIZUMAB 400 MG/20ML IV SOLN
4.0000 mg/kg | Freq: Once | INTRAVENOUS | Status: AC
  Administered 2023-02-01: 476 mg via INTRAVENOUS
  Filled 2023-02-01: qty 20

## 2023-02-01 MED ORDER — ACETAMINOPHEN 325 MG PO TABS
650.0000 mg | ORAL_TABLET | Freq: Once | ORAL | Status: AC
  Administered 2023-02-01: 650 mg via ORAL
  Filled 2023-02-01: qty 2

## 2023-02-01 NOTE — Progress Notes (Signed)
Diagnosis: Rheumatoid Arthritis  Provider:  Chilton Greathouse MD  Procedure: IV Infusion  IV Type: Peripheral, IV Location: L Hand  Actemra (Tocilizumab), Dose: 476  Infusion Start Time: 1011  Infusion Stop Time: 1120  Post Infusion IV Care: Peripheral IV Discontinued  Discharge: Condition: Good, Destination: Home . AVS Declined  Performed by:  Rico Ala, LPN

## 2023-02-02 ENCOUNTER — Ambulatory Visit (INDEPENDENT_AMBULATORY_CARE_PROVIDER_SITE_OTHER): Payer: BC Managed Care – PPO | Admitting: Clinical

## 2023-02-02 ENCOUNTER — Other Ambulatory Visit: Payer: Self-pay | Admitting: Rheumatology

## 2023-02-02 ENCOUNTER — Encounter (HOSPITAL_COMMUNITY): Payer: Self-pay | Admitting: Clinical

## 2023-02-02 DIAGNOSIS — F331 Major depressive disorder, recurrent, moderate: Secondary | ICD-10-CM | POA: Diagnosis not present

## 2023-02-02 DIAGNOSIS — F419 Anxiety disorder, unspecified: Secondary | ICD-10-CM | POA: Diagnosis not present

## 2023-02-02 MED ORDER — ONDANSETRON HCL 4 MG PO TABS: 4.0000 mg | ORAL_TABLET | ORAL | 1 refills | Status: DC | PRN

## 2023-02-02 NOTE — Telephone Encounter (Signed)
Last Fill: 11/23/2022  Next Visit: 03/22/2023  Last Visit: 12/20/2022  Dx: Rheumatoid arthritis involving multiple sites with positive rheumatoid factor   Current Dose per office note on 12/20/2022: not discussed  Okay to refill Zofran?

## 2023-02-02 NOTE — Telephone Encounter (Signed)
Zofran 4 mg tablet, 1 tablet p.o. every 8 hours as needed nausea.

## 2023-02-02 NOTE — Progress Notes (Signed)
THERAPIST PROGRESS NOTE  Session Time: 3:05-4:02pm  Session #3  Participation Level: Active  Behavioral Response: Casual Alert Anxious and Excited  Type of Therapy: Individual Therapy  Treatment Goals addressed:  LTG: Shannon David will score less than 5 on the Generalized Anxiety Disorder 7 Scale (GAD-7) STG: Shannon David will practice problem solving skills 3 times per week for the next 4 weeks. STG: Shannon David will reduce frequency of avoidant behaviors by 50% as evidenced by self-report in therapy sessions LTG: Reduce frequency, intensity, and duration of depression symptoms so that daily functioning is improved LTG: Shannon David will score less than 5 on the Patient Health Questionnaire (PHQ-9) STG: Shannon David will participate in at least 80% of scheduled STG: Shannon David will complete at least 80% of assigned homework STG: Shannon David will identify cognitive patterns and beliefs that support depression  ProgressTowards Goals: Progressing  Interventions: CBT, Supportive, and Social Skills Training "I" statements  Summary: Shannon David is a 32 y.o. female who presents with Major Depressive Disorder that has worsened recently.  She expressed increasing confidence in her mental health treatment since the increase in her medicine and the addition of Trazodone for sleep.  She has had 4 good days in a row, which is a significant contrast to last week.  We talked about using mindfulness practices to lower her anxiety baseline overall over time.  She was assigned to do 5-finger breathing once back and forth on each hand (for a total breath count of 20) twice daily until next session.  Even though this technique had been taught previously she reported not using it.  She is supposed to go back to work on 9/2, will be wearing tennis shoes since she is now out of the boot.  She is concerned about changes that have been made at work and continue to be made, even including elimination of positions due to profit issues.   She conceded that she is no longer concerned about any gossip that may be circulating about her.  She has cognitive dissonance over feeling guilty for being out of work for this long and simultaneously being grateful for not having to work through the pain on a daily basis.  Her husband has not been as understanding as she wishes he would be.  When she shuts down due to the pain, he tells her that she is so distant he is just going to leave her alone.  She has told him that only serves to make things worse.  This week, when they had been out all day and very active, she laid down to rest for a few minutes.  He was confrontational about this, demanding to know if she was depressed or if she was "just going to stay in bed."  She tried to let him know it was not a good week for her.  Later, he had a bad weekend so she pointed out the similarities, felt he was dismissive of her.  CSW introduced "I" statements and we practicesn ways that she could perhaps get her point across to her husband in a way he might actually hear it instead of feeling attacked.  CSW introduced the concept of CBT.  Specifically, CSW explained how our thoughts affect and often determine our feelings.  Age-appropriate examples were provided.  She was able to teach back in an age-appropriate way.  CSW provided patient with a graphic, easy worksheet of the most common cognitive distortions, and we reviewed these with a variety of examples given.  CSW moved on to  explain how it will be helpful for her to continue to review the worksheet after session and start to recognize thoughts that are distorted.  We then reviewed a worksheet called "Challenging Negative Thoughts" and used examples with her husband" to work through the questions.  At the conclusion of that exercise, patient stated that she believes she could replace some of her thoughts regarding husband with more healthy, realistic, helpful ones.  CSW also introduced the Anger Iceberg to  the patient, asking her to just take a look at that over the next 2 weeks to think about what emotion(s) might lie beneath husband's anger.  Suicidal/Homicidal: No without intent/plan  Therapist Response:  Patient is progressing AEB engaging in scheduled therapy session.  She presented oriented x5 and stated she was feeling "more confident and better."  CSW evaluated patient's medication compliance and self-care since last session.  CSW reviewed the last session with patient who reported that she is feeling much better than she did then.  Patient stated the medicine has made a difference.  CSW taught CBT-related coping skills, specifically cognitive distortions and challenging negative thoughts.  Patient received these willingly and could discuss them in a way to indicate good comprehension.  CSW assigned patient the task of trying out these new coping skills (handouts given for cognitive distortions, challenging negative thoughts, anger iceberg) prior to next session on 8/21.    Throughout the session, CSW gave patient the opportunity to explore thoughts and feelings associated with current life situations and past/present external stressors.   CSW encouraged patient's expression of feelings and validated patient's thoughts using empathy, active listening, open body language, and unconditional positive regard.    Plan: Return again in 2 weeks.  Next appointment:  8/21  Recommendations:  Return to therapy in 2 weeks, engage in self care behaviors, work on cognitive distortions recognition and challenges, "I" statements, and thinking about the anger iceberg in relation to husband  Diagnosis:  Major depressive disorder, recurrent episode, moderate degree (HCC)  Anxiety disorder, unspecified type  Collaboration of Care: Psychiatrist AEB - psychiatric provider can see therapy notes as needed, therapist read psychiatric note prior to session  Patient/Guardian was advised Release of Information must  be obtained prior to any record release in order to collaborate their care with an outside provider. Patient/Guardian was advised if they have not already done so to contact the registration department to sign all necessary forms in order for [place] to release information regarding their care.   Consent: Patient/Guardian gives verbal consent for treatment and assignment of benefits for services provided during this visit. Patient/Guardian expressed understanding and agreed to proceed.   Lynnell Chad, LCSW 02/02/2023

## 2023-02-15 ENCOUNTER — Other Ambulatory Visit (HOSPITAL_COMMUNITY): Payer: Self-pay | Admitting: Student

## 2023-02-16 ENCOUNTER — Encounter (HOSPITAL_COMMUNITY): Payer: Self-pay | Admitting: Clinical

## 2023-02-16 ENCOUNTER — Ambulatory Visit (INDEPENDENT_AMBULATORY_CARE_PROVIDER_SITE_OTHER): Payer: BC Managed Care – PPO | Admitting: Clinical

## 2023-02-16 DIAGNOSIS — F419 Anxiety disorder, unspecified: Secondary | ICD-10-CM | POA: Diagnosis not present

## 2023-02-16 DIAGNOSIS — F331 Major depressive disorder, recurrent, moderate: Secondary | ICD-10-CM

## 2023-02-16 NOTE — Progress Notes (Signed)
THERAPIST PROGRESS NOTE  Session Time: 1:05pm-2:00pm  Session #4  Participation Level: Active  Behavioral Response: Casual Alert Anxious and Depressed  Type of Therapy: Individual Therapy  Treatment Goals addressed:  LTG: Shannon David will score less than 5 on the Generalized Anxiety Disorder 7 Scale (GAD-7) STG: Shannon David will practice problem solving skills 3 times per week for the next 4 weeks. STG: Shannon David will reduce frequency of avoidant behaviors by 50% as evidenced by self-report in therapy sessions LTG: Reduce frequency, intensity, and duration of depression symptoms so that daily functioning is improved LTG: Shannon David will score less than 5 on the Patient Health Questionnaire (PHQ-9) STG: Shannon David will participate in at least 80% of scheduled  STG: Shannon David will complete at least 80% of assigned homework STG: Shannon David will identify cognitive patterns and beliefs that support depression  ProgressTowards Goals: Progressing  Interventions: CBT, Supportive, and Other: gratitude  "I" statements  Summary: Shannon David is a 32 y.o. female who presents with Major Depressive Disorder that has worsened recently.  She is not feeling quite as positive as last session but still feels like making progress physically toward returning to work.  She reported bickering with her husband over the course of days, although he has shared his pride that she has gone back to school.  Nonetheless he tells her that he does not think she is happy.  He expressed that she should let her past go and this feels comparative and dismissive to her.  CSW explored with her the possibility that the two of them have contrasting and conflicted core beliefs, which she found quite plausible and helpful.   CSW encouraged patient to continue to look at what could underlie husband's anger and help him by starting to be reassuring in that area of his life.  She is going back to work 9/3, was reminded to work on cognitive  distortions that people will be talking about her, think about other possible responses.  Was reassured that not wanting to have sex with husband is a natural response when tired, suggested talking to him when they are calm/rested to come up with solutions for the future.  Asked her to continue with Cognitive Distortions review and Challenging Negative Thoughts process.  Assigned the task of working on writing out 3 things she is grateful for daily.   Suicidal/Homicidal: No without intent/plan  Therapist Response:  Patient is progressing AEB engaging in scheduled therapy session.  She presented oriented x5 and stated she was feeling "okay, tired, not sleeping well."  CSW evaluated patient's medication compliance and self-care since last session.  CSW reviewed the last session with patient who reported she has been looking at the Cognitive Distortions but not really using the Challenging Negative Thoughts yet.  Patient stated she has started back to school and enjoys it but it does create other stress in her life.  CSW explored core beliefs with her.  Throughout the session, CSW gave patient the opportunity to explore thoughts and feelings associated with current life situations and past/present external stressors.   CSW encouraged patient's expression of feelings and validated patient's thoughts using empathy, active listening, open body language, and unconditional positive regard.    Plan: Return again in 2 weeks.  Next appointment:  9/4  Recommendations:  Return to therapy in 2 weeks, engage in self care behaviors, continue to work on cognitive distortions recognition and challenges, "I" statements, and thinking about the anger iceberg in relation to husband, work on journaling about 3 things she  is grateful for every day  Diagnosis:  Major depressive disorder, recurrent episode, moderate degree (HCC)  Anxiety disorder, unspecified type  Collaboration of Care: Psychiatrist AEB - psychiatric  provider can see therapy notes as needed, therapist read psychiatric note prior to session  Patient/Guardian was advised Release of Information must be obtained prior to any record release in order to collaborate their care with an outside provider. Patient/Guardian was advised if they have not already done so to contact the registration department to sign all necessary forms in order for [place] to release information regarding their care.   Consent: Patient/Guardian gives verbal consent for treatment and assignment of benefits for services provided during this visit. Patient/Guardian expressed understanding and agreed to proceed.   Lynnell Chad, LCSW 02/16/2023

## 2023-03-01 ENCOUNTER — Ambulatory Visit: Payer: BC Managed Care – PPO

## 2023-03-02 ENCOUNTER — Ambulatory Visit (INDEPENDENT_AMBULATORY_CARE_PROVIDER_SITE_OTHER): Payer: BC Managed Care – PPO

## 2023-03-02 ENCOUNTER — Ambulatory Visit (INDEPENDENT_AMBULATORY_CARE_PROVIDER_SITE_OTHER): Payer: BC Managed Care – PPO | Admitting: Clinical

## 2023-03-02 ENCOUNTER — Encounter (HOSPITAL_COMMUNITY): Payer: Self-pay | Admitting: Clinical

## 2023-03-02 VITALS — BP 110/75 | HR 83 | Temp 98.1°F | Resp 20 | Ht 62.0 in | Wt 270.6 lb

## 2023-03-02 DIAGNOSIS — M0579 Rheumatoid arthritis with rheumatoid factor of multiple sites without organ or systems involvement: Secondary | ICD-10-CM

## 2023-03-02 DIAGNOSIS — Z79899 Other long term (current) drug therapy: Secondary | ICD-10-CM | POA: Diagnosis not present

## 2023-03-02 DIAGNOSIS — Z91198 Patient's noncompliance with other medical treatment and regimen for other reason: Secondary | ICD-10-CM

## 2023-03-02 MED ORDER — ACETAMINOPHEN 325 MG PO TABS
650.0000 mg | ORAL_TABLET | Freq: Once | ORAL | Status: AC
  Administered 2023-03-02: 650 mg via ORAL
  Filled 2023-03-02: qty 2

## 2023-03-02 MED ORDER — DIPHENHYDRAMINE HCL 25 MG PO CAPS
25.0000 mg | ORAL_CAPSULE | Freq: Once | ORAL | Status: AC
  Administered 2023-03-02: 25 mg via ORAL
  Filled 2023-03-02: qty 1

## 2023-03-02 MED ORDER — TOCILIZUMAB 400 MG/20ML IV SOLN
4.0000 mg/kg | Freq: Once | INTRAVENOUS | Status: AC
  Administered 2023-03-02: 476 mg via INTRAVENOUS
  Filled 2023-03-02: qty 23.8

## 2023-03-02 NOTE — Progress Notes (Signed)
Patient ID: Shannon David, female   DOB: March 24, 1991, 32 y.o.   MRN: [id number]  Therapy Progress Note  Patient had an appointment scheduled with therapist on 03/02/2023  at 1:00pm.  CSW called patient at 9:06 and was informed she was on the way.  She did not arrive for the session by 9:30, so a second phone call was made.  She was in line for an infusion, also with [facility].  She was not aware that she had been double booked.  [facility] policy is to charge for a no-show, but due to these circumstances, "no charge" will be incurred.   Encounter Diagnosis  Name Primary?   Failure to attend appointment with reason given Yes     Ambrose Mantle, LCSW 03/02/2023, 1:54 PM

## 2023-03-02 NOTE — Progress Notes (Signed)
Diagnosis: Rheumatoid Arthritis  Provider:  Chilton Greathouse MD  Procedure: IV Infusion  IV Type: Peripheral, IV Location: L Antecubital  Actemra (Tocilizumab), Dose: 476 mg  Infusion Start Time: 1405  Infusion Stop Time: 1508  Post Infusion IV Care: Patient declined observation and Peripheral IV Discontinued  Discharge: Condition: Stable, Destination: Home . AVS Declined  Performed by:  Loney Hering, LPN

## 2023-03-08 NOTE — Progress Notes (Unsigned)
Office Visit Note  Patient: Shannon David             Date of Birth: 1991/01/27           MRN: [id number]             PCP: Lars Mage, NP Referring: Lars Mage, NP Visit Date: 03/22/2023 Occupation: @GUAROCC @  Subjective:  Medication monitoring   History of Present Illness: Almitra Mere is a 32 y.o. female with history of seropositive rheumatoid arthritis and osteoarthritis.  Patient is prescribed IV Actemra-first infusion on 11/30/2022, Plaquenil 200 mg 1 tablet BID Monday-Friday (restarted 10/2021), MTX 8 tablets once wkly, and folic acid 2 mg daily.  Patient's last dose of Actemra was administered on 02/01/2023.  She has been off of methotrexate since June 2024.  Patient was diagnosed with C. difficile and has been under the care of Dr. Loreta Ave. She has several days left of vancomycin.  Patient states that the diarrhea has improved and she has had about 2 bowel movements per day.  Patient states she will be starting a probiotic as recommended by Dr. Loreta Ave.  Patient does not plan to resume Actemra or methotrexate until she has been cleared by Dr. Loreta Ave. Patient continues to have chronic pain involving the right hand.  She has noticed worsening ulnar deviation and has had intermittent swelling.  Patient states for the past several days she is also been experiencing pain in her right hip joint.  She has been taking ibuprofen and tramadol for pain relief.  She denies any other joint pain or joint swelling at this time.   Activities of Daily Living:  Patient reports morning stiffness for 1-2 hours.   Patient Reports nocturnal pain.  Difficulty dressing/grooming: Reports Difficulty climbing stairs: Denies Difficulty getting out of chair: Denies Difficulty using hands for taps, buttons, cutlery, and/or writing: Reports  Review of Systems  Constitutional:  Positive for fatigue.  HENT:  Negative for mouth sores and mouth dryness.   Eyes:  Negative for dryness.  Respiratory:  Negative for  shortness of breath.   Cardiovascular:  Negative for chest pain and palpitations.  Gastrointestinal:  Positive for abdominal pain and diarrhea. Negative for blood in stool, constipation, nausea and vomiting.  Endocrine: Negative for increased urination.  Genitourinary:  Negative for involuntary urination.  Musculoskeletal:  Positive for joint pain, joint pain, joint swelling, myalgias, morning stiffness, muscle tenderness and myalgias. Negative for gait problem and muscle weakness.  Skin:  Positive for sensitivity to sunlight. Negative for color change, rash and hair loss.  Allergic/Immunologic: Positive for susceptible to infections.  Neurological:  Negative for dizziness and headaches.  Hematological:  Negative for swollen glands.  Psychiatric/Behavioral:  Positive for depressed mood and sleep disturbance. The patient is nervous/anxious.     PMFS History:  Patient Active Problem List   Diagnosis Date Noted   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   Passive suicidal ideations 11/11/2022   Major depressive disorder, recurrent episode, moderate degree (HCC) 11/11/2022   Pain in left knee 06/04/2021   History of PCOS 02/21/2017   High risk medication use 09/21/2016   Rheumatoid arthritis involving multiple sites with positive rheumatoid factor (HCC) 09/21/2016   History of hypothyroidism 09/21/2016   History of depression 09/21/2016    Past Medical History:  Diagnosis Date   Arthritis    COVID    History of Clostridium difficile infection 01/2023   History of depression    History of hypothyroidism    History  of PCOS    Rheumatoid arthritis (HCC)     Family History  Problem Relation Age of Onset   Hypertension Mother    Clotting disorder Mother    Rheum arthritis Brother    Lung cancer Paternal Uncle    Cancer Maternal Grandmother        lung   Past Surgical History:  Procedure Laterality Date   BUNIONECTOMY Right    FOOT SURGERY Right 08/24/2021   Social  History   Social History Narrative   Left Handed    Lives in a one story home   Drinks Caffeine    Immunization History  Administered Date(s) Administered   PFIZER(Purple Top)SARS-COV-2 Vaccination 10/23/2021     Objective: Vital Signs: BP (!) 132/90 (BP Location: Left Arm, Patient Position: Sitting, Cuff Size: Large)   Pulse 80   Resp 14   Ht 5\' 2"  (1.575 m)   Wt 271 lb 12.8 oz (123.3 kg)   BMI 49.71 kg/m    Physical Exam Vitals and nursing note reviewed.  Constitutional:      Appearance: She is well-developed.  HENT:     Head: Normocephalic and atraumatic.  Eyes:     Conjunctiva/sclera: Conjunctivae normal.  Cardiovascular:     Rate and Rhythm: Normal rate and regular rhythm.     Heart sounds: Normal heart sounds.  Pulmonary:     Effort: Pulmonary effort is normal.     Breath sounds: Normal breath sounds.  Abdominal:     General: Bowel sounds are normal.     Palpations: Abdomen is soft.  Musculoskeletal:     Cervical back: Normal range of motion.  Lymphadenopathy:     Cervical: No cervical adenopathy.  Skin:    General: Skin is warm and dry.     Capillary Refill: Capillary refill takes less than 2 seconds.  Neurological:     Mental Status: She is alert and oriented to person, place, and time.  Psychiatric:        Behavior: Behavior normal.      Musculoskeletal Exam: C-spine has good range of motion.  No midline spinal tenderness.  Shoulder joints have good range of motion with no discomfort or tenderness.  Elbow joints have good range of motion with no joint tenderness.  Limited range of motion of both wrist joints.  Thickening of all MCP joints with ulnar deviation.  Hip joints have good range of motion with discomfort in the right hip.  Knee joints have good range of motion with no warmth or effusion.  Ankle joints have good range of motion with no tenderness or joint swelling.  CDAI Exam: CDAI Score: -- Patient Global: 40 / 100; Provider Global: 40 /  100 Swollen: --; Tender: -- Joint Exam 03/22/2023   No joint exam has been documented for this visit   There is currently no information documented on the homunculus. Go to the Rheumatology activity and complete the homunculus joint exam.  Investigation: No additional findings.  Imaging: No results found.  Recent Labs: Lab Results  Component Value Date   WBC 4.9 12/28/2022   HGB 12.9 12/28/2022   PLT 257.0 12/28/2022   NA 134 (L) 12/28/2022   K 3.4 (L) 12/28/2022   CL 103 12/28/2022   CO2 24 12/28/2022   GLUCOSE 112 (H) 12/28/2022   BUN 10 12/28/2022   CREATININE 0.62 12/28/2022   BILITOT 0.7 12/28/2022   ALKPHOS 68 12/28/2022   AST 19 12/28/2022   ALT 21 12/28/2022   PROT  7.3 12/28/2022   ALBUMIN 4.2 12/28/2022   CALCIUM 9.2 12/28/2022   GFRAA 134 07/18/2020   QFTBGOLD Negative 08/13/2016   QFTBGOLDPLUS NEGATIVE 10/18/2022    Speciality Comments: PLQ Eye Exam: 10/19/2022 WNL @ [facility] Follow up in 12 months.   Prior therapy: Orencia (inadequate response 9/18-9/19), Enbrel (inadequate response 3/15/5/15), Humira (inadequate response 11/17-3/18), and injectable methotrexate (non-compliance)  Procedures:  No procedures performed Allergies: Keflex [cephalexin]      Assessment / Plan:     Visit Diagnoses: Rheumatoid arthritis involving multiple sites with positive rheumatoid factor (HCC) - Severe, erosive rheumatoid arthritis involving multiple joints: Patient presents today with ongoing pain involving the right hand.  She has synovial thickening of all MCP joints with ulnar deviation.  She has noticed some progression of deformity.  X-rays of both hands were obtained on 12/08/2020 which were consistent with severe erosive rheumatoid arthritis.  Possible increased narrowing was noted in the left second MCP and right fourth MCP joint when compared to x-rays from 2020.  Offered to update x-rays today but she would like to hold off until her follow-up visit. Patient  is currently prescribed IV Actemra monthly injections, Plaquenil 200 mg 1 tablet by mouth twice daily Monday through Friday, methotrexate tablets by mouth once weekly.  Her last dose of IV Actemra was administered on 02/01/2023.  She has been off of methotrexate since the end of June 2024.  She is currently under the care of Dr. Loreta Ave for management of C. difficile colitis.  She has not yet finished her course of vancomycin but her symptoms have improved.  She will be starting a probiotic as recommended by Dr. Loreta Ave.  Discussed that she will require clearance by Dr. Loreta Ave prior to resuming Actemra infusions and oral methotrexate.  A refill of methotrexate and Plaquenil were sent to the pharmacy today. She was advised to notify us if she develops any new or worsening symptoms.  She will follow-up in the office in 3 months or sooner if needed. - Plan: methotrexate (RHEUMATREX) 2.5 MG tablet, hydroxychloroquine (PLAQUENIL) 200 MG tablet  High risk medication use - IV Actemra-first infusion on 11/30/2022, Plaquenil 200 mg 1 tablet BID Monday-Friday (restarted 10/2021), Methotrexate 8 tablets once wkly, and folic acid 2 mg daily.  CBC and CMP updated on 12/28/22.  Patient will have updated lab work with her next infusion. TB Gold negative on 10/18/22.  Patient is currently being treated with vancomycin for C. difficile.  Her last Actemra infusion was administered on 02/01/2023 and she has been holding methotrexate since the end of June 2024.  Discussed the importance of holding actemra and methotrexate if she develops signs or symptoms of an infection and to resume once the infection has completely cleared.  PLQ Eye Exam: 10/19/2022 WNL @ [facility] Follow up in 12 months.  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 Patient has limited range of motion of both wrist joints.  Synovial thickening and ulnar deviation of the MCPs.  Trigger finger, right ring finger: Not currently symptomatic.   Primary osteoarthritis of both knees: She has good range of motion of both knee joints on examination today.  No warmth or effusion noted.  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 Marylene Land.  She underwent right foot  reconstructive surgery on 08/24/2021 which was successful.  Other medical conditions are listed as follows:  History of depression  History of hypothyroidism  History of pneumonia  History of PCOS   Orders: No orders of the defined types were placed in this encounter.  Meds ordered this encounter  Medications   methotrexate (RHEUMATREX) 2.5 MG tablet    Sig: TAKE 8 TABLETS (20 MG TOTAL) BY MOUTH ONCE A WEEK. CAUTION:CHEMOTHERAPY. PROTECT FROM LIGHT.    Dispense:  96 tablet    Refill:  0   hydroxychloroquine (PLAQUENIL) 200 MG tablet    Sig: TAKE 1 TABLET BY MOUTH TWICE A DAY MONDAY-FRIDAY    Dispense:  120 tablet    Refill:  0    Follow-Up Instructions: Return in about 3 months (around 06/21/2023) for Rheumatoid arthritis, Osteoarthritis.   Gearldine Bienenstock, PA-C  Note - This record has been created using Dragon software.  Chart creation errors have been sought, but may not always  have been located. Such creation errors do not reflect on  the standard of medical care.

## 2023-03-16 ENCOUNTER — Ambulatory Visit (INDEPENDENT_AMBULATORY_CARE_PROVIDER_SITE_OTHER): Payer: BC Managed Care – PPO | Admitting: Clinical

## 2023-03-16 ENCOUNTER — Encounter (HOSPITAL_COMMUNITY): Payer: Self-pay | Admitting: Clinical

## 2023-03-16 DIAGNOSIS — F331 Major depressive disorder, recurrent, moderate: Secondary | ICD-10-CM | POA: Diagnosis not present

## 2023-03-16 DIAGNOSIS — F411 Generalized anxiety disorder: Secondary | ICD-10-CM | POA: Diagnosis not present

## 2023-03-16 NOTE — Progress Notes (Signed)
THERAPIST PROGRESS NOTE  Session Time: 1:33:1-58pm  Session #5  Virtual Visit via Video Note  I connected with Shannon David on 03/16/23 at  1:00 PM EDT by a video enabled telemedicine application and verified that I am speaking with the correct person using two identifiers.  Location: Patient: home Provider: [facility] outpatient therapy office -[place]   I discussed the limitations of evaluation and management by telemedicine and the availability of in person appointments. The patient expressed understanding and agreed to proceed.   I discussed the assessment and treatment plan with the patient. The patient was provided an opportunity to ask questions and all were answered. The patient agreed with the plan and demonstrated an understanding of the instructions.   The patient was advised to call back or seek an in-person evaluation if the symptoms worsen or if the condition fails to improve as anticipated.  I provided 25 minutes of non-face-to-face time during this encounter.  Lynnell Chad, LCSW   Participation Level: Active  Behavioral Response: Casual Alert and Lethargic Anxious, Depressed, and Exhausted  Type of Therapy: Individual Therapy  Treatment Goals addressed:  LTG: Shannon David will score less than 5 on the Generalized Anxiety Disorder 7 Scale (GAD-7) STG: Shannon David will practice problem solving skills 3 times per week for the next 4 weeks. STG: Shannon David will reduce frequency of avoidant behaviors by 50% as evidenced by self-report in therapy sessions LTG: Reduce frequency, intensity, and duration of depression symptoms so that daily functioning is improved LTG: Shannon David will score less than 5 on the Patient Health Questionnaire (PHQ-9) STG: Shannon David will participate in at least 80% of scheduled therapy sessions STG: Shannon David will complete at least 80% of assigned homework STG: Shannon David will identify cognitive patterns and beliefs that support  depression  ProgressTowards Goals: Progressing  Interventions: CBT, Supportive, and Other: illness  "I" statements  Summary: Shannon David is a 32 y.o. female who presents with Major Depressive Disorder that has worsened recently.  She had forgotten the session but called back after CSW left her a message, was able to do a shorter virtual session.  She has been having trouble keeping track of time due to being diagnosed with C-diff and needing to take medications, per doctor, every 6 hours exactly.  She is on her second round of antibiotics due to not knowing to do that the first round.  She stated that she apparently has had C-diff for 3 months without it being diagnosed. She thought her diarrhea was caused by her psychiatric medications so did not go to the doctor earlier.  She is back at work, did go to work last night but left early because she felt so bad.  She is exhausted, which was very obvious from looking at her.  CSW encouraged her to consider that she might be contagious and should not be at work, given that she is still symptomatic.  She does work with food.  She is concerned about the fact that she was just out for her surgery for an extended time.  We processed how she can go about making this decision with input from her gastroenterologist's office.  She continues to have anxiety over her husband's opinion  of her having medical issues, stating "He probably looks at me like I go to the doctor too much."  She shared that even when she tells him how tired she is, he does not offer to help with any chores.  CSW encouraged her to use "I" statements to convey her  needs and wants.  She is reluctant to do this.  She does feel overwhelmed but the cognitive distortion of what she "should" do and "should be able to do" are really interfering with her ability to let herself rest and recover.  She is going to a friend's house this afternoon to check on a friend and her new baby because the electricity in  the house went out.  CSW asked her to stay away from the baby due to her likely contagious status.    In addition to the pressure of returning to work and being sick, patient is in school on-line.  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  At last sesson, asked patient to continue with Cognitive Distortions review and Challenging Negative Thoughts process.  Assigned the task of working on writing out 3 things she is grateful for daily.  This was not discussed during this abbreviated session and needs to be addressed next session.  Suicidal/Homicidal: No without intent/plan  Therapist Response:  Patient is progressing AEB engaging in scheduled therapy session.  She presented oriented x5 and stated she feels a little down and overwhelmed because of going back to work and being diagnosed with C-diff, which she has apparently had for the last 3 months without knowing it.  The diarrhea has depleted her energy.   CSW evaluated patient's medication compliance and self-care since last session.  She is doing well with her medicines and stated she is taking care of herself by taking her medicine including now her antibiotics CSW reviewed the last session with patient who reported she has not really been able to follow through with assigned tasks. CSW encouraged her to put herself first in order to be able to recover and thus take care of other people and other obligations.  Throughout the session, CSW gave patient the opportunity to explore thoughts and feelings associated with current life situations and past/present external stressors.   CSW encouraged patient's expression of feelings and validated patient's thoughts using empathy, active listening, open body language, and unconditional positive regard.      Plan: Return again in 2 weeks.  Next appointment:  10/2  Recommendations:  Return to therapy in 2 weeks, engage in self care behaviors, continue to work on cognitive distortions recognition  and challenges, employ "I" statements with husband and workplace as needed about her current illness,  work on journaling about 3 things she is grateful for every day  Diagnosis:  Major depressive disorder, recurrent episode, moderate degree (HCC)  Generalized anxiety disorder  Collaboration of Care: Psychiatrist AEB - psychiatric provider can see therapy notes as needed, therapist can read psychiatric notes  Patient/Guardian was advised Release of Information must be obtained prior to any record release in order to collaborate their care with an outside provider. Patient/Guardian was advised if they have not already done so to contact the registration department to sign all necessary forms in order for [place] to release information regarding their care.   Consent: Patient/Guardian gives verbal consent for treatment and assignment of benefits for services provided during this visit. Patient/Guardian expressed understanding and agreed to proceed.   Lynnell Chad, LCSW 03/16/2023

## 2023-03-22 ENCOUNTER — Other Ambulatory Visit (HOSPITAL_COMMUNITY): Payer: Self-pay | Admitting: Gastroenterology

## 2023-03-22 ENCOUNTER — Encounter: Payer: Self-pay | Admitting: Physician Assistant

## 2023-03-22 ENCOUNTER — Ambulatory Visit: Payer: BC Managed Care – PPO | Attending: Physician Assistant | Admitting: Physician Assistant

## 2023-03-22 VITALS — BP 132/90 | HR 80 | Resp 14 | Ht 62.0 in | Wt 271.8 lb

## 2023-03-22 DIAGNOSIS — M0579 Rheumatoid arthritis with rheumatoid factor of multiple sites without organ or systems involvement: Secondary | ICD-10-CM

## 2023-03-22 DIAGNOSIS — Z8742 Personal history of other diseases of the female genital tract: Secondary | ICD-10-CM

## 2023-03-22 DIAGNOSIS — M65341 Trigger finger, right ring finger: Secondary | ICD-10-CM | POA: Diagnosis not present

## 2023-03-22 DIAGNOSIS — Z79899 Other long term (current) drug therapy: Secondary | ICD-10-CM | POA: Diagnosis not present

## 2023-03-22 DIAGNOSIS — M21941 Unspecified acquired deformity of hand, right hand: Secondary | ICD-10-CM | POA: Diagnosis not present

## 2023-03-22 DIAGNOSIS — Z8639 Personal history of other endocrine, nutritional and metabolic disease: Secondary | ICD-10-CM

## 2023-03-22 DIAGNOSIS — R1011 Right upper quadrant pain: Secondary | ICD-10-CM

## 2023-03-22 DIAGNOSIS — M21961 Unspecified acquired deformity of right lower leg: Secondary | ICD-10-CM

## 2023-03-22 DIAGNOSIS — Z8659 Personal history of other mental and behavioral disorders: Secondary | ICD-10-CM

## 2023-03-22 DIAGNOSIS — M21962 Unspecified acquired deformity of left lower leg: Secondary | ICD-10-CM

## 2023-03-22 DIAGNOSIS — Z8701 Personal history of pneumonia (recurrent): Secondary | ICD-10-CM

## 2023-03-22 DIAGNOSIS — M21942 Unspecified acquired deformity of hand, left hand: Secondary | ICD-10-CM

## 2023-03-22 DIAGNOSIS — M170 Bilateral primary osteoarthritis of knee: Secondary | ICD-10-CM

## 2023-03-22 DIAGNOSIS — M069 Rheumatoid arthritis, unspecified: Secondary | ICD-10-CM

## 2023-03-22 MED ORDER — HYDROXYCHLOROQUINE SULFATE 200 MG PO TABS
ORAL_TABLET | ORAL | 0 refills | Status: DC
Start: 2023-03-22 — End: 2024-03-17

## 2023-03-22 MED ORDER — METHOTREXATE SODIUM 2.5 MG PO TABS
ORAL_TABLET | ORAL | 0 refills | Status: DC
Start: 2023-03-22 — End: 2023-06-15

## 2023-03-25 ENCOUNTER — Ambulatory Visit (HOSPITAL_COMMUNITY): Payer: BC Managed Care – PPO

## 2023-03-25 ENCOUNTER — Encounter (HOSPITAL_COMMUNITY): Payer: Self-pay

## 2023-03-30 ENCOUNTER — Ambulatory Visit (INDEPENDENT_AMBULATORY_CARE_PROVIDER_SITE_OTHER): Payer: BC Managed Care – PPO | Admitting: Clinical

## 2023-03-30 ENCOUNTER — Other Ambulatory Visit (INDEPENDENT_AMBULATORY_CARE_PROVIDER_SITE_OTHER): Payer: BC Managed Care – PPO

## 2023-03-30 ENCOUNTER — Encounter (HOSPITAL_COMMUNITY): Payer: Self-pay | Admitting: Clinical

## 2023-03-30 ENCOUNTER — Ambulatory Visit: Payer: BC Managed Care – PPO

## 2023-03-30 ENCOUNTER — Ambulatory Visit (HOSPITAL_COMMUNITY): Payer: BC Managed Care – PPO | Admitting: Student

## 2023-03-30 VITALS — BP 118/78 | HR 104 | Temp 98.2°F | Resp 18 | Ht 62.0 in | Wt 271.2 lb

## 2023-03-30 DIAGNOSIS — M0579 Rheumatoid arthritis with rheumatoid factor of multiple sites without organ or systems involvement: Secondary | ICD-10-CM

## 2023-03-30 DIAGNOSIS — F331 Major depressive disorder, recurrent, moderate: Secondary | ICD-10-CM

## 2023-03-30 DIAGNOSIS — F411 Generalized anxiety disorder: Secondary | ICD-10-CM | POA: Diagnosis not present

## 2023-03-30 DIAGNOSIS — Z79899 Other long term (current) drug therapy: Secondary | ICD-10-CM

## 2023-03-30 LAB — COMPREHENSIVE METABOLIC PANEL
ALT: 17 U/L (ref 0–35)
AST: 19 U/L (ref 0–37)
Albumin: 4.2 g/dL (ref 3.5–5.2)
Alkaline Phosphatase: 65 U/L (ref 39–117)
BUN: 9 mg/dL (ref 6–23)
CO2: 24 meq/L (ref 19–32)
Calcium: 9.1 mg/dL (ref 8.4–10.5)
Chloride: 105 meq/L (ref 96–112)
Creatinine, Ser: 0.74 mg/dL (ref 0.40–1.20)
GFR: 105.72 mL/min (ref >60.00)
Glucose, Bld: 121 mg/dL — ABNORMAL HIGH (ref 70–99)
Potassium: 3.1 meq/L — ABNORMAL LOW (ref 3.5–5.1)
Sodium: 138 meq/L (ref 135–145)
Total Bilirubin: 0.6 mg/dL (ref 0.2–1.2)
Total Protein: 7 g/dL (ref 6.0–8.3)

## 2023-03-30 LAB — CBC WITH DIFFERENTIAL/PLATELET
Basophils Absolute: 0 10*3/uL (ref 0.0–0.1)
Basophils Relative: 0.5 % (ref 0.0–3.0)
Eosinophils Absolute: 0.1 10*3/uL (ref 0.0–0.7)
Eosinophils Relative: 0.9 % (ref 0.0–5.0)
HCT: 38.9 % (ref 36.0–46.0)
Hemoglobin: 13.1 g/dL (ref 12.0–15.0)
Lymphocytes Relative: 21.9 % (ref 12.0–46.0)
Lymphs Abs: 1.4 10*3/uL (ref 0.7–4.0)
MCHC: 33.7 g/dL (ref 30.0–36.0)
MCV: 94 fL (ref 78.0–100.0)
Monocytes Absolute: 0.6 10*3/uL (ref 0.1–1.0)
Monocytes Relative: 9.1 % (ref 3.0–12.0)
Neutro Abs: 4.3 10*3/uL (ref 1.4–7.7)
Neutrophils Relative %: 67.6 % (ref 43.0–77.0)
Platelets: 231 10*3/uL (ref 150.0–400.0)
RBC: 4.14 Mil/uL (ref 3.87–5.11)
RDW: 13.6 % (ref 11.5–15.5)
WBC: 6.3 10*3/uL (ref 4.0–10.5)

## 2023-03-30 MED ORDER — ACETAMINOPHEN 325 MG PO TABS
650.0000 mg | ORAL_TABLET | Freq: Once | ORAL | Status: AC
  Administered 2023-03-30: 650 mg via ORAL
  Filled 2023-03-30: qty 2

## 2023-03-30 MED ORDER — DIPHENHYDRAMINE HCL 25 MG PO CAPS
25.0000 mg | ORAL_CAPSULE | Freq: Once | ORAL | Status: AC
  Administered 2023-03-30: 25 mg via ORAL
  Filled 2023-03-30: qty 1

## 2023-03-30 MED ORDER — TOCILIZUMAB 400 MG/20ML IV SOLN
4.0000 mg/kg | Freq: Once | INTRAVENOUS | Status: AC
  Administered 2023-03-30: 492 mg via INTRAVENOUS
  Filled 2023-03-30: qty 24.6

## 2023-03-30 NOTE — Progress Notes (Signed)
Diagnosis: Rheumatoid Arthritis  Provider:  Chilton Greathouse MD  Procedure: IV Infusion  IV Type: Peripheral, IV Location: L Antecubital  Actemra (Tocilizumab), Dose: 492 mg  Infusion Start Time: 0929  Infusion Stop Time: 1030  Post Infusion IV Care: Patient declined observation and Peripheral IV Discontinued  Discharge: Condition: Stable, Destination: Home . AVS declined.  Performed by:  Rico Ala, LPN

## 2023-03-30 NOTE — Progress Notes (Unsigned)
THERAPIST PROGRESS NOTE  Session Time: 1:03-2:00pm  Session #6  Participation Level: Active  Behavioral Response: Casual Alert and Lethargic Anxious, Depressed, and Exhausted  ***  Type of Therapy: Individual Therapy  Treatment Goals addressed: *** LTG: Basil will score less than 5 on the Generalized Anxiety Disorder 7 Scale (GAD-7) STG: Amariss will practice problem solving skills 3 times per week for the next 4 weeks. STG: Cleta will reduce frequency of avoidant behaviors by 50% as evidenced by self-report in therapy sessions LTG: Reduce frequency, intensity, and duration of depression symptoms so that daily functioning is improved LTG: Raffaella will score less than 5 on the Patient Health Questionnaire (PHQ-9) STG: Lilyann will participate in at least 80% of scheduled therapy sessions STG: Mariapaula will complete at least 80% of assigned homework STG: Shonette will identify cognitive patterns and beliefs that support depression  ProgressTowards Goals: Progressing  Interventions: CBT, Supportive, and Other: illness  "I" statements ***  Summary: Carrah Eppolito is a 32 y.o. female who presents with Major Depressive Disorder that has worsened recently.  ***  At last sesson, asked patient to continue with Cognitive Distortions review and Challenging Negative Thoughts process.  Assigned the task of working on writing out 3 things she is grateful for daily.  This was not discussed during this abbreviated session and needs to be addressed next session.  Suicidal/Homicidal: No without intent/plan  Therapist Response: Patient is progressing AEB engaging in scheduled therapy session.  ***e presented oriented x5 and stated ***he was feeling "***."  CSW evaluated patient's medication compliance and self-care since last session.  CSW reviewed the last session with patient who reported ***.  Patient stated ***.  CSW taught CBT-related coping skills, specifically *** and ***.  Patient received  these willingly and could discuss them in a way to indicate good comprehension.  CSW assigned patient the task of trying out these new coping skills (***) prior to next session on ***.  CSW encouraged patient to schedule more therapy sessions for the future, as ***  Throughout the session, CSW gave patient the opportunity to explore thoughts and feelings associated with current life situations and past/present external stressors.   CSW encouraged patient's expression of feelings and validated patient's thoughts using empathy, active listening, open body language, and unconditional positive regard.       Plan: Return again in 4 weeks.  Next appointment:  11/4  Recommendations:  Return to therapy on 11/4, then every 2 weeks, engage in self care behaviors, continue to work on cognitive distortions recognition and challenges, employ "I" statements with husband and workplace as needed about her current illness,  work on journaling about 3 things she is grateful for every day ***  Diagnosis:  Major depressive disorder, recurrent episode, moderate degree (HCC)  Generalized anxiety disorder  Collaboration of Care: Psychiatrist AEB - psychiatric provider can see therapy notes as needed, therapist can read psychiatric notes  Patient/Guardian was advised Release of Information must be obtained prior to any record release in order to collaborate their care with an outside provider. Patient/Guardian was advised if they have not already done so to contact the registration department to sign all necessary forms in order for [place] to release information regarding their care.   Consent: Patient/Guardian gives verbal consent for treatment and assignment of benefits for services provided during this visit. Patient/Guardian expressed understanding and agreed to proceed.   Lynnell Chad, LCSW 03/30/2023

## 2023-03-31 ENCOUNTER — Encounter (HOSPITAL_COMMUNITY): Payer: Self-pay | Admitting: Clinical

## 2023-04-09 ENCOUNTER — Other Ambulatory Visit: Payer: Self-pay

## 2023-04-09 ENCOUNTER — Emergency Department (HOSPITAL_COMMUNITY): Payer: BC Managed Care – PPO

## 2023-04-09 ENCOUNTER — Emergency Department (HOSPITAL_COMMUNITY)
Admission: EM | Admit: 2023-04-09 | Discharge: 2023-04-10 | Disposition: A | Discharge status: Home / Self Care | Payer: BC Managed Care – PPO | Attending: Emergency Medicine | Admitting: Emergency Medicine

## 2023-04-09 DIAGNOSIS — R1013 Epigastric pain: Secondary | ICD-10-CM | POA: Insufficient documentation

## 2023-04-09 DIAGNOSIS — E871 Hypo-osmolality and hyponatremia: Secondary | ICD-10-CM

## 2023-04-09 DIAGNOSIS — R197 Diarrhea, unspecified: Secondary | ICD-10-CM | POA: Insufficient documentation

## 2023-04-09 DIAGNOSIS — E860 Dehydration: Secondary | ICD-10-CM | POA: Insufficient documentation

## 2023-04-09 DIAGNOSIS — R1011 Right upper quadrant pain: Secondary | ICD-10-CM | POA: Diagnosis present

## 2023-04-09 DIAGNOSIS — D72819 Decreased white blood cell count, unspecified: Secondary | ICD-10-CM

## 2023-04-09 LAB — COMPREHENSIVE METABOLIC PANEL
ALT: 44 U/L (ref 0–44)
AST: 30 U/L (ref 15–41)
Albumin: 4 g/dL (ref 3.5–5.0)
Alkaline Phosphatase: 55 U/L (ref 38–126)
Anion gap: 7 (ref 5–15)
BUN: 15 mg/dL (ref 6–20)
CO2: 22 mmol/L (ref 22–32)
Calcium: 8 mg/dL — ABNORMAL LOW (ref 8.9–10.3)
Chloride: 104 mmol/L (ref 98–111)
Creatinine, Ser: 0.6 mg/dL (ref 0.44–1.00)
GFR, Estimated: >60 mL/min (ref >60)
Glucose, Bld: 90 mg/dL (ref 70–99)
Potassium: 3.7 mmol/L (ref 3.5–5.1)
Sodium: 133 mmol/L — ABNORMAL LOW (ref 135–145)
Total Bilirubin: 0.8 mg/dL (ref 0.3–1.2)
Total Protein: 6.9 g/dL (ref 6.5–8.1)

## 2023-04-09 LAB — URINALYSIS, ROUTINE W REFLEX MICROSCOPIC
Bilirubin Urine: NEGATIVE
Glucose, UA: NEGATIVE mg/dL
Hgb urine dipstick: NEGATIVE
Ketones, ur: NEGATIVE mg/dL
Nitrite: NEGATIVE
Protein, ur: NEGATIVE mg/dL
Specific Gravity, Urine: 1.023 (ref 1.005–1.030)
pH: 5 (ref 5.0–8.0)

## 2023-04-09 LAB — CBC
HCT: 36.6 % (ref 36.0–46.0)
Hemoglobin: 12.5 g/dL (ref 12.0–15.0)
MCH: 32.1 pg (ref 26.0–34.0)
MCHC: 34.2 g/dL (ref 30.0–36.0)
MCV: 93.8 fL (ref 80.0–100.0)
Platelets: 226 10*3/uL (ref 150–400)
RBC: 3.9 MIL/uL (ref 3.87–5.11)
RDW: 12.4 % (ref 11.5–15.5)
WBC: 3.7 10*3/uL — ABNORMAL LOW (ref 4.0–10.5)
nRBC: 0 % (ref 0.0–0.2)

## 2023-04-09 LAB — LIPASE, BLOOD: Lipase: 30 U/L (ref 11–51)

## 2023-04-09 LAB — HCG, SERUM, QUALITATIVE: Preg, Serum: NEGATIVE

## 2023-04-09 MED ORDER — SODIUM CHLORIDE 0.9 % IV BOLUS
1000.0000 mL | Freq: Once | INTRAVENOUS | Status: AC
  Administered 2023-04-09: 1000 mL via INTRAVENOUS

## 2023-04-09 MED ORDER — IOHEXOL 300 MG/ML  SOLN
100.0000 mL | Freq: Once | INTRAMUSCULAR | Status: AC | PRN
  Administered 2023-04-09: 100 mL via INTRAVENOUS

## 2023-04-09 NOTE — ED Triage Notes (Signed)
Pt c/o ongoing upper abdominal cramping pain over the past month. Pain associated with nausea and loose stools.  Reports completing two round of abx for concerns for c diff with [facility].

## 2023-04-09 NOTE — ED Provider Notes (Signed)
[place] EMERGENCY DEPARTMENT AT [facility] Provider Note   CSN: [id number] Arrival date & time: 04/09/23  1903     History  Chief Complaint  Patient presents with   Abdominal Pain    Shannon David is a 32 y.o. female history of C. difficile here presenting with right upper quadrant pain.  Patient states that she was diagnosed with C. difficile in the beginning of September.  Patient finished 2 courses of p.o. vancomycin.  Patient states that she still has 5-6 episodes of diarrhea per day.  Patient states that she has been having some epigastric pain after she eats.  She states that she follows with Dr. Loreta Ave and she was ordered for outpatient right upper quadrant ultrasound but she did not have it yet.  Patient was prescribed Zofran and took it prior to arrival  The history is provided by the patient.       Home Medications Prior to Admission medications   Medication Sig Start Date End Date Taking? Authorizing Provider  albuterol (VENTOLIN HFA) 108 (90 Base) MCG/ACT inhaler Inhale 1-2 puffs into the lungs every 6 (six) hours as needed for wheezing or shortness of breath. 05/24/22   Cecil Cobbs, PA-C  Cholecalciferol (VITAMIN D3) 50 MCG (2000 UT) capsule Take 2,000 Units by mouth daily. 10/13/21   [provider]  folic acid (FOLVITE) 1 MG tablet Take 2 tablets (2 mg total) by mouth daily. 09/02/22   Gearldine Bienenstock, PA-C  gabapentin (NEURONTIN) 300 MG capsule Take 300 mg by mouth 3 (three) times daily as needed (for neuropathy).    [provider]  hydroxychloroquine (PLAQUENIL) 200 MG tablet TAKE 1 TABLET BY MOUTH TWICE A DAY MONDAY-FRIDAY 03/22/23   Gearldine Bienenstock, PA-C  ibuprofen (IBU-200) 200 MG tablet Take 400 mg by mouth every 6 (six) hours as needed for mild pain or headache.    [provider]  levothyroxine (SYNTHROID) 100 MCG tablet Take 100 mcg by mouth daily before breakfast. Patient not taking: Reported on 03/22/2023     [provider]  levothyroxine (SYNTHROID) 112 MCG tablet Take 112 mcg by mouth daily. 12/23/22   [provider]  methotrexate (RHEUMATREX) 2.5 MG tablet TAKE 8 TABLETS (20 MG TOTAL) BY MOUTH ONCE A WEEK. CAUTION:CHEMOTHERAPY. PROTECT FROM LIGHT. 03/22/23   Gearldine Bienenstock, PA-C  naloxone [facility]) nasal spray 4 mg/0.1 mL Place 1 spray into the nose once as needed (AS DIRECTED). 11/01/22   [provider]  ondansetron (ZOFRAN) 4 MG tablet Take 1 tablet (4 mg total) by mouth every 8 (eight) hours as needed. 02/03/23   Pollyann Savoy, MD  PARoxetine (PAXIL) 20 MG tablet Take 1 tablet (20 mg total) by mouth daily. Along with 40 mg tablets for a total of 60 mg. 01/24/23 03/25/23  Lamar Sprinkles, MD  PARoxetine (PAXIL) 40 MG tablet Take 1 tablet (40 mg total) by mouth daily. Along with 20 mg for a total of 60 mg daily. 01/24/23 03/25/23  Lamar Sprinkles, MD  SRONYX 0.1-20 MG-MCG tablet Take 1 tablet by mouth daily. 12/14/22   [provider]  Tocilizumab (ACTEMRA IV) Inject into the vein. Patient not taking: Reported on 03/22/2023    [provider]  traMADol (ULTRAM) 50 MG tablet Take 50 mg by mouth 3 (three) times daily as needed (for pain).    [provider]  traZODone (DESYREL) 50 MG tablet Take 1 tablet (50 mg total) by mouth at bedtime as needed for sleep. 01/24/23  03/25/23  Lamar Sprinkles, MD  vancomycin (VANCOCIN) 125 MG capsule 1 cap(s) orally every 6 hours for 14 days 03/08/23   [provider]      Allergies    Keflex [cephalexin]    Review of Systems   Review of Systems  Gastrointestinal:  Positive for abdominal pain and diarrhea.  All other systems reviewed and are negative.   Physical Exam Updated Vital Signs BP (!) 152/95 (BP Location: Right Arm)   Pulse 92   Temp 98.5 F (36.9 C) (Oral)   Resp 19   Ht 5\' 2"  (1.575 m)   Wt 122.9 kg   LMP 04/01/2023   SpO2 100%   BMI 49.57 kg/m  Physical Exam Vitals and nursing note  reviewed.  Constitutional:      Comments: Slightly dehydrated  HENT:     Head: Normocephalic.     Mouth/Throat:     Mouth: Mucous membranes are moist.  Eyes:     Extraocular Movements: Extraocular movements intact.     Pupils: Pupils are equal, round, and reactive to light.  Cardiovascular:     Rate and Rhythm: Normal rate and regular rhythm.     Heart sounds: Normal heart sounds.  Pulmonary:     Effort: Pulmonary effort is normal.     Breath sounds: Normal breath sounds.  Abdominal:     General: Abdomen is flat.     Comments: Mild epigastric and RUQ tenderness   Skin:    General: Skin is warm.     Capillary Refill: Capillary refill takes less than 2 seconds.  Neurological:     General: No focal deficit present.  Psychiatric:        Mood and Affect: Mood normal.     ED Results / Procedures / Treatments   Labs (all labs ordered are listed, but only abnormal results are displayed) Labs Reviewed  URINALYSIS, ROUTINE W REFLEX MICROSCOPIC - Abnormal; Notable for the following components:      Result Value   APPearance HAZY (*)    Leukocytes,Ua TRACE (*)    Bacteria, UA RARE (*)    All other components within normal limits  C DIFFICILE QUICK SCREEN W PCR REFLEX    GASTROINTESTINAL PANEL BY PCR, STOOL (REPLACES STOOL CULTURE)  LIPASE, BLOOD  COMPREHENSIVE METABOLIC PANEL  CBC  HCG, SERUM, QUALITATIVE    EKG EKG Interpretation Date/Time:  Saturday April 09 2023 19:27:59 EDT Ventricular Rate:  87 PR Interval:  168 QRS Duration:  92 QT Interval:  356 QTC Calculation: 429 R Axis:   59  Text Interpretation: Sinus rhythm No significant change since last tracing Confirmed by Richardean Canal [phone]) on 04/09/2023 8:58:45 PM  Radiology No results found.  Procedures Procedures    Medications Ordered in ED Medications  sodium chloride 0.9 % bolus 1,000 mL (1,000 mLs Intravenous New Bag/Given 04/09/23 2104)    ED Course/ Medical Decision Making/ A&P                                  Medical Decision Making Shannon David is a 32 y.o. female here presenting with nausea and epigastric pain and persistent diarrhea.  Consider persistent C. difficile versus colitis versus ileus.  Plan to get CBC and CMP and right upper quadrant ultrasound and CT abdomen pelvis.  Will also get C. difficile and GI pathogen panel.  11:21 PM Patient's labs unremarkable. US unremarkable.  CT abdomen pelvis  and C. difficile is pending. Signed out to Dr. Preston Fleeting to follow up CT and C diff.   Amount and/or Complexity of Data Reviewed Labs: ordered. Radiology: ordered.  Risk Prescription drug management.    Final Clinical Impression(s) / ED Diagnoses Final diagnoses:  None    Rx / DC Orders ED Discharge Orders     None         Charlynne Pander, MD 04/09/23 2323

## 2023-04-09 NOTE — ED Provider Notes (Signed)
Care assumed from Dr. Silverio Lay, patient recently treated for C. Difficile, comes in with upper abdominal pain and ongoing diarrhea. CT abdomen is pending, may need to get additional course of oral vancomycin.  CT shows no acute process.  I have independently viewed the images, and agree with radiologist interpretation.  Patient has been unable to give a stool sample.  Since she has already had 2 courses of vancomycin, I feel that additional antibiotics should be withheld until there is documentation of ongoing C. difficile infection.  I am referring her back to her gastroenterologist.  In the meantime, I am discharging her with a prescription for cholestyramine.  Return precautions discussed.   CT ABDOMEN PELVIS W CONTRAST  Result Date: 04/09/2023 CLINICAL DATA:  Upper abdominal cramping and pain over the past month. Associated with nausea and diarrhea completed 2 rounds of antibiotics for concern for C diff. EXAM: CT ABDOMEN AND PELVIS WITH CONTRAST TECHNIQUE: Multidetector CT imaging of the abdomen and pelvis was performed using the standard protocol following bolus administration of intravenous contrast. RADIATION DOSE REDUCTION: This exam was performed according to the departmental dose-optimization program which includes automated exposure control, adjustment of the mA and/or kV according to patient size and/or use of iterative reconstruction technique. CONTRAST:  OMNIPAQUE IOHEXOL 300 MG/ML  SOLN COMPARISON:  Same day abdominal ultrasound FINDINGS: Lower chest: No acute abnormality. Hepatobiliary: Hepatic steatosis. Normal gallbladder. No biliary dilation. Pancreas: Unremarkable. Spleen: Unremarkable. Adrenals/Urinary Tract: Normal adrenal glands. No urinary calculi or hydronephrosis. Bladder is unremarkable. Stomach/Bowel: Normal caliber large and small bowel. No bowel wall thickening. The appendix is normal.Stomach is within normal limits. Vascular/Lymphatic: No significant vascular findings are  present. No enlarged abdominal or pelvic lymph nodes. Reproductive: Unremarkable. Other: No free intraperitoneal fluid or air. Musculoskeletal: No acute fracture. IMPRESSION: 1. No acute abnormality in the abdomen or pelvis. 2. Hepatic steatosis. Electronically Signed   By: Minerva Fester M.D.   On: 04/09/2023 23:30    Dione Booze, MD 04/10/23 [phone]

## 2023-04-10 MED ORDER — CHOLESTYRAMINE 4 G PO PACK: 4.0000 g | PACK | Freq: Four times a day (QID) | ORAL | 0 refills | Status: DC

## 2023-04-10 NOTE — Discharge Instructions (Addendum)
We need to determine whether your diarrhea is from ongoing infection with C. difficile or from something else.  He will need to give a stool sample for testing.  Please coordinate that with your gastroenterologist.  In the meantime, return if you start vomiting, start running a high fever, or start having severe abdominal pain.

## 2023-04-11 ENCOUNTER — Ambulatory Visit (HOSPITAL_COMMUNITY): Admission: RE | Admit: 2023-04-11 | Payer: BC Managed Care – PPO | Source: Ambulatory Visit

## 2023-04-11 ENCOUNTER — Inpatient Hospital Stay (HOSPITAL_COMMUNITY): Admission: RE | Admit: 2023-04-11 | Payer: BC Managed Care – PPO | Source: Ambulatory Visit

## 2023-04-21 ENCOUNTER — Inpatient Hospital Stay (HOSPITAL_COMMUNITY)
Admission: AD | Admit: 2023-04-21 | Discharge: 2023-04-26 | DRG: 885 | Disposition: A | Discharge status: Home / Self Care | Payer: BC Managed Care – PPO | Source: Other Acute Inpatient Hospital | Attending: Psychiatry | Admitting: Psychiatry

## 2023-04-21 ENCOUNTER — Encounter (HOSPITAL_COMMUNITY): Payer: Self-pay | Admitting: Psychiatry

## 2023-04-21 DIAGNOSIS — I10 Essential (primary) hypertension: Secondary | ICD-10-CM | POA: Diagnosis present

## 2023-04-21 DIAGNOSIS — M069 Rheumatoid arthritis, unspecified: Secondary | ICD-10-CM | POA: Diagnosis present

## 2023-04-21 DIAGNOSIS — Z818 Family history of other mental and behavioral disorders: Secondary | ICD-10-CM

## 2023-04-21 DIAGNOSIS — J45909 Unspecified asthma, uncomplicated: Secondary | ICD-10-CM | POA: Diagnosis present

## 2023-04-21 DIAGNOSIS — Z79890 Hormone replacement therapy (postmenopausal): Secondary | ICD-10-CM | POA: Diagnosis not present

## 2023-04-21 DIAGNOSIS — Z801 Family history of malignant neoplasm of trachea, bronchus and lung: Secondary | ICD-10-CM | POA: Diagnosis not present

## 2023-04-21 DIAGNOSIS — Z79631 Long term (current) use of antimetabolite agent: Secondary | ICD-10-CM | POA: Diagnosis not present

## 2023-04-21 DIAGNOSIS — Z8616 Personal history of COVID-19: Secondary | ICD-10-CM | POA: Diagnosis not present

## 2023-04-21 DIAGNOSIS — F331 Major depressive disorder, recurrent, moderate: Secondary | ICD-10-CM

## 2023-04-21 DIAGNOSIS — Z888 Allergy status to other drugs, medicaments and biological substances status: Secondary | ICD-10-CM

## 2023-04-21 DIAGNOSIS — Z79899 Other long term (current) drug therapy: Secondary | ICD-10-CM

## 2023-04-21 DIAGNOSIS — Z8261 Family history of arthritis: Secondary | ICD-10-CM | POA: Diagnosis not present

## 2023-04-21 DIAGNOSIS — M0579 Rheumatoid arthritis with rheumatoid factor of multiple sites without organ or systems involvement: Secondary | ICD-10-CM

## 2023-04-21 DIAGNOSIS — Z79620 Long term (current) use of immunosuppressive biologic: Secondary | ICD-10-CM | POA: Diagnosis not present

## 2023-04-21 DIAGNOSIS — E039 Hypothyroidism, unspecified: Secondary | ICD-10-CM | POA: Diagnosis present

## 2023-04-21 DIAGNOSIS — Z832 Family history of diseases of the blood and blood-forming organs and certain disorders involving the immune mechanism: Secondary | ICD-10-CM

## 2023-04-21 DIAGNOSIS — F332 Major depressive disorder, recurrent severe without psychotic features: Principal | ICD-10-CM | POA: Diagnosis present

## 2023-04-21 DIAGNOSIS — E669 Obesity, unspecified: Secondary | ICD-10-CM | POA: Diagnosis present

## 2023-04-21 DIAGNOSIS — Z6841 Body Mass Index (BMI) 40.0 and over, adult: Secondary | ICD-10-CM | POA: Diagnosis not present

## 2023-04-21 DIAGNOSIS — Z8249 Family history of ischemic heart disease and other diseases of the circulatory system: Secondary | ICD-10-CM

## 2023-04-21 DIAGNOSIS — Z630 Problems in relationship with spouse or partner: Secondary | ICD-10-CM

## 2023-04-21 DIAGNOSIS — F411 Generalized anxiety disorder: Secondary | ICD-10-CM | POA: Diagnosis present

## 2023-04-21 DIAGNOSIS — Z635 Disruption of family by separation and divorce: Secondary | ICD-10-CM

## 2023-04-21 HISTORY — DX: Major depressive disorder, recurrent severe without psychotic features: F33.2

## 2023-04-21 HISTORY — DX: Major depressive disorder, recurrent, moderate: F33.1

## 2023-04-21 MED ORDER — FOLIC ACID 1 MG PO TABS
2.0000 mg | ORAL_TABLET | Freq: Every day | ORAL | Status: DC
  Administered 2023-04-21 – 2023-04-26 (×6): 2 mg via ORAL
  Filled 2023-04-21 (×8): qty 2

## 2023-04-21 MED ORDER — ACETAMINOPHEN 325 MG PO TABS
650.0000 mg | ORAL_TABLET | Freq: Four times a day (QID) | ORAL | Status: DC | PRN
  Administered 2023-04-22 – 2023-04-24 (×4): 650 mg via ORAL
  Filled 2023-04-21 (×4): qty 2

## 2023-04-21 MED ORDER — PAROXETINE HCL 20 MG PO TABS
40.0000 mg | ORAL_TABLET | Freq: Every day | ORAL | Status: DC
  Administered 2023-04-21 – 2023-04-22 (×2): 40 mg via ORAL
  Filled 2023-04-21 (×4): qty 2

## 2023-04-21 MED ORDER — LEVOTHYROXINE SODIUM 112 MCG PO TABS
112.0000 ug | ORAL_TABLET | Freq: Every day | ORAL | Status: DC
  Administered 2023-04-22 – 2023-04-26 (×5): 112 ug via ORAL
  Filled 2023-04-21 (×7): qty 1

## 2023-04-21 MED ORDER — HYDROXYCHLOROQUINE SULFATE 200 MG PO TABS
200.0000 mg | ORAL_TABLET | Freq: Every day | ORAL | Status: DC
  Administered 2023-04-21 – 2023-04-22 (×2): 200 mg via ORAL
  Filled 2023-04-21 (×4): qty 1

## 2023-04-21 MED ORDER — GABAPENTIN 300 MG PO CAPS
300.0000 mg | ORAL_CAPSULE | Freq: Two times a day (BID) | ORAL | Status: DC
  Administered 2023-04-21 – 2023-04-26 (×10): 300 mg via ORAL
  Filled 2023-04-21 (×13): qty 1

## 2023-04-21 MED ORDER — MAGNESIUM HYDROXIDE 400 MG/5ML PO SUSP: 30.0000 mL | Freq: Every day | ORAL | Status: DC | PRN

## 2023-04-21 MED ORDER — TRAZODONE HCL 50 MG PO TABS
50.0000 mg | ORAL_TABLET | Freq: Every evening | ORAL | Status: DC | PRN
  Administered 2023-04-21 – 2023-04-22 (×2): 50 mg via ORAL
  Filled 2023-04-21 (×2): qty 1

## 2023-04-21 MED ORDER — CHOLESTYRAMINE 4 G PO PACK
4.0000 g | PACK | Freq: Four times a day (QID) | ORAL | Status: DC
  Filled 2023-04-21 (×7): qty 1

## 2023-04-21 MED ORDER — ALBUTEROL SULFATE HFA 108 (90 BASE) MCG/ACT IN AERS: 1.0000 | INHALATION_SPRAY | Freq: Four times a day (QID) | RESPIRATORY_TRACT | Status: DC | PRN

## 2023-04-21 MED ORDER — ALUM & MAG HYDROXIDE-SIMETH 200-200-20 MG/5ML PO SUSP: 30.0000 mL | ORAL | Status: DC | PRN

## 2023-04-21 NOTE — [facility] Group Notes (Signed)
[facility] Group Notes:  (Nursing/MHT/Case Management/Adjunct)  Date:  04/21/2023  Time:  9:13 PM  Type of Therapy:  Psychoeducational Skills  Participation Level:  Minimal  Participation Quality:  Attentive  Affect:  Flat  Cognitive:  Appropriate  Insight:  Limited  Engagement in Group:  Limited  Modes of Intervention:  Education  Summary of Progress/Problems: Patient rated her day as a 4 out of 10 since she hasn't slept well since she was discharged from the emergency room. Her goal for tomorrow is to get back onto her medication.   Hazle Coca S 04/21/2023, 9:13 PM

## 2023-04-22 ENCOUNTER — Encounter (HOSPITAL_COMMUNITY): Payer: Self-pay

## 2023-04-22 ENCOUNTER — Other Ambulatory Visit: Payer: Self-pay

## 2023-04-22 ENCOUNTER — Encounter (HOSPITAL_COMMUNITY): Payer: Self-pay | Admitting: Psychiatry

## 2023-04-22 DIAGNOSIS — T1491XA Suicide attempt, initial encounter: Secondary | ICD-10-CM

## 2023-04-22 DIAGNOSIS — F411 Generalized anxiety disorder: Secondary | ICD-10-CM

## 2023-04-22 HISTORY — DX: Suicide attempt, initial encounter: T14.91XA

## 2023-04-22 LAB — VITAMIN D 25 HYDROXY (VIT D DEFICIENCY, FRACTURES): Vit D, 25-Hydroxy: 40.46 ng/mL (ref 30–100)

## 2023-04-22 LAB — FOLATE: Folate: 39.9 ng/mL (ref >5.9)

## 2023-04-22 LAB — VITAMIN B12: Vitamin B-12: 374 pg/mL (ref 180–914)

## 2023-04-22 MED ORDER — ONDANSETRON 4 MG PO TBDP
4.0000 mg | ORAL_TABLET | Freq: Three times a day (TID) | ORAL | Status: DC | PRN
  Filled 2023-04-22: qty 1

## 2023-04-22 MED ORDER — PAROXETINE HCL 20 MG PO TABS
60.0000 mg | ORAL_TABLET | Freq: Every day | ORAL | Status: DC
  Administered 2023-04-23 – 2023-04-26 (×4): 60 mg via ORAL
  Filled 2023-04-22: qty 2
  Filled 2023-04-22 (×4): qty 3
  Filled 2023-04-22: qty 2

## 2023-04-22 MED ORDER — METHOTREXATE SODIUM 2.5 MG PO TABS
20.0000 mg | ORAL_TABLET | ORAL | Status: DC
  Administered 2023-04-22: 20 mg via ORAL
  Filled 2023-04-22: qty 8

## 2023-04-22 MED ORDER — HYDROXYCHLOROQUINE SULFATE 200 MG PO TABS
200.0000 mg | ORAL_TABLET | ORAL | Status: DC
  Administered 2023-04-22 – 2023-04-26 (×4): 200 mg via ORAL
  Filled 2023-04-22 (×6): qty 1

## 2023-04-22 NOTE — BH IP Treatment Plan (Signed)
Interdisciplinary Treatment and Diagnostic Plan Update  04/22/2023 Time of Session: 10:30 AM  Shannon David MRN: [id number]  Principal Diagnosis: MDD (major depressive disorder), recurrent episode, moderate (HCC)  Secondary Diagnoses: Principal Problem:   MDD (major depressive disorder), recurrent episode, moderate (HCC) Active Problems:   GAD (generalized anxiety disorder)   Current Medications:  Current Facility-Administered Medications  Medication Dose Route Frequency Provider Last Rate Last Admin   acetaminophen (TYLENOL) tablet 650 mg  650 mg Oral Q6H PRN [name], NP   650 mg at 04/22/23 0824   albuterol (VENTOLIN HFA) 108 (90 Base) MCG/ACT inhaler 1-2 puff  1-2 puff Inhalation Q6H PRN [name], NP       alum & mag hydroxide-simeth (MAALOX/MYLANTA) 200-200-20 MG/5ML suspension 30 mL  30 mL Oral Q4H PRN [name], NP       folic acid (FOLVITE) tablet 2 mg  2 mg Oral Daily [name], NP   2 mg at 04/22/23 4782   gabapentin (NEURONTIN) capsule 300 mg  300 mg Oral BID [name], NP   300 mg at 04/22/23 9562   hydroxychloroquine (PLAQUENIL) tablet 200 mg  200 mg Oral 2 times per day on Monday Tuesday Wednesday Thursday Friday Otho Bellows, [facility]       levothyroxine (SYNTHROID) tablet 112 mcg  112 mcg Oral Daily [name], NP   112 mcg at 04/22/23 0645   magnesium hydroxide (MILK OF MAGNESIA) suspension 30 mL  30 mL Oral Daily PRN [name], NP       methotrexate (RHEUMATREX) tablet 20 mg  20 mg Oral Weekly Green, Terri L, RPH       [START ON 04/23/2023] PARoxetine (PAXIL) tablet 60 mg  60 mg Oral Daily Golda Acre, MD       traZODone (DESYREL) tablet 50 mg  50 mg Oral QHS PRN [name], NP   50 mg at 04/21/23 2144   PTA Medications: Medications Prior to Admission  Medication Sig Dispense Refill Last Dose   acetaminophen (TYLENOL) 500 MG tablet Take 1,000 mg by mouth 3 (three) times daily as needed for mild pain (pain score 1-3).       albuterol (VENTOLIN HFA) 108 (90 Base) MCG/ACT inhaler Inhale 1-2 puffs into the lungs every 6 (six) hours as needed for wheezing or shortness of breath. 18 g 0 Past Week   Cholecalciferol (VITAMIN D3) 50 MCG (2000 UT) capsule Take 2,000 Units by mouth daily.   Past Week   folic acid (FOLVITE) 1 MG tablet Take 2 tablets (2 mg total) by mouth daily. 180 tablet 3 Past Week   gabapentin (NEURONTIN) 300 MG capsule Take 300 mg by mouth 2 (two) times daily.   Past Week   hydroxychloroquine (PLAQUENIL) 200 MG tablet TAKE 1 TABLET BY MOUTH TWICE A DAY MONDAY-FRIDAY 120 tablet 0 Past Week   levothyroxine (SYNTHROID) 112 MCG tablet Take 112 mcg by mouth daily.   Past Week   methotrexate (RHEUMATREX) 2.5 MG tablet TAKE 8 TABLETS (20 MG TOTAL) BY MOUTH ONCE A WEEK. CAUTION:CHEMOTHERAPY. PROTECT FROM LIGHT. (Patient taking differently: Take 20 mg by mouth once a week. TAKE 8 TABLETS (20 MG TOTAL) BY MOUTH ONCE A WEEK. CAUTION:CHEMOTHERAPY. PROTECT FROM LIGHT. Takes every Wednesday) 96 tablet 0    ondansetron (ZOFRAN-ODT) 8 MG disintegrating tablet Take 8 mg by mouth every 8 (eight) hours as needed for nausea or vomiting.   Past Week   PARoxetine (PAXIL) 40 MG  tablet Take 60 mg by mouth at bedtime.   Past Week   traMADol (ULTRAM) 50 MG tablet Take 50 mg by mouth 3 (three) times daily as needed for moderate pain (pain score 4-6).   Past Week   traZODone (DESYREL) 50 MG tablet Take 1 tablet (50 mg total) by mouth at bedtime as needed for sleep. 30 tablet 1    ibuprofen (IBU-200) 200 MG tablet Take 400 mg by mouth every 6 (six) hours as needed for mild pain or headache.      naloxone (NARCAN) nasal spray 4 mg/0.1 mL Place 1 spray into the nose once as needed (AS DIRECTED).      Tocilizumab (ACTEMRA IV) Inject into the vein every 30 (thirty) days. 80 mg/4 ml       Patient Stressors: Loss of a relationship    Patient Strengths: Ability for insight  Average or above average intelligence  Capable of independent  living  Motivation for treatment/growth  Supportive family/friends   Treatment Modalities: Medication Management, Group therapy, Case management,  1 to 1 session with clinician, Psychoeducation, Recreational therapy.   Physician Treatment Plan for Primary Diagnosis: MDD (major depressive disorder), recurrent episode, moderate (HCC) Long Term Goal(s):     Short Term Goals:    Medication Management: Evaluate patient's response, side effects, and tolerance of medication regimen.  Therapeutic Interventions: 1 to 1 sessions, Unit Group sessions and Medication administration.  Evaluation of Outcomes: Not Progressing  Physician Treatment Plan for Secondary Diagnosis: Principal Problem:   MDD (major depressive disorder), recurrent episode, moderate (HCC) Active Problems:   GAD (generalized anxiety disorder)  Long Term Goal(s):     Short Term Goals:       Medication Management: Evaluate patient's response, side effects, and tolerance of medication regimen.  Therapeutic Interventions: 1 to 1 sessions, Unit Group sessions and Medication administration.  Evaluation of Outcomes: Not Progressing   RN Treatment Plan for Primary Diagnosis: MDD (major depressive disorder), recurrent episode, moderate (HCC) Long Term Goal(s): Knowledge of disease and therapeutic regimen to maintain health will improve  Short Term Goals: Ability to remain free from injury will improve, Ability to verbalize frustration and anger appropriately will improve, Ability to demonstrate self-control, Ability to participate in decision making will improve, Ability to verbalize feelings will improve, Ability to disclose and discuss suicidal ideas, Ability to identify and develop effective coping behaviors will improve, and Compliance with prescribed medications will improve  Medication Management: RN will administer medications as ordered by provider, will assess and evaluate patient's response and provide education to  patient for prescribed medication. RN will report any adverse and/or side effects to prescribing provider.  Therapeutic Interventions: 1 on 1 counseling sessions, Psychoeducation, Medication administration, Evaluate responses to treatment, Monitor vital signs and CBGs as ordered, Perform/monitor CIWA, COWS, AIMS and Fall Risk screenings as ordered, Perform wound care treatments as ordered.  Evaluation of Outcomes: Not Progressing   LCSW Treatment Plan for Primary Diagnosis: MDD (major depressive disorder), recurrent episode, moderate (HCC) Long Term Goal(s): Safe transition to appropriate next level of care at discharge, Engage patient in therapeutic group addressing interpersonal concerns.  Short Term Goals: Engage patient in aftercare planning with referrals and resources, Increase social support, Increase ability to appropriately verbalize feelings, Increase emotional regulation, Facilitate acceptance of mental health diagnosis and concerns, Facilitate patient progression through stages of change regarding substance use diagnoses and concerns, Identify triggers associated with mental health/substance abuse issues, and Increase skills for wellness and recovery  Therapeutic Interventions: Assess  for all discharge needs, 1 to 1 time with Child psychotherapist, Explore available resources and support systems, Assess for adequacy in community support network, Educate family and significant other(s) on suicide prevention, Complete Psychosocial Assessment, Interpersonal group therapy.  Evaluation of Outcomes: Not Progressing   Progress in Treatment: Attending groups: Yes. Participating in groups: Yes. Taking medication as prescribed: Yes. Toleration medication: Yes. Family/Significant other contact made: No, will contact:  Macarthur Critchley (dad) [phone] Patient understands diagnosis: Yes. Discussing patient identified problems/goals with staff: Yes. Medical problems stabilized or resolved: Yes. Denies  suicidal/homicidal ideation: Yes. Issues/concerns per patient self-inventory: No.  New problem(s) identified: No, Describe:  None reported   New Short Term/Long Term Goal(s): medication stabilization, elimination of SI thoughts, development of comprehensive mental wellness plan.    Patient Goals:  " Get back on my medication and cope with my stress and not want to harm myself "   Discharge Plan or Barriers: Patient recently admitted. CSW will continue to follow and assess for appropriate referrals and possible discharge planning.    Reason for Continuation of Hospitalization: Anxiety Depression Medication stabilization Suicidal ideation  Estimated Length of Stay: 5-7 days   Last 3 [place] Suicide Severity Risk Score: Flowsheet Row [facility] ([facility] from [facility]09[facility]20[facility] in [facility] at [facility] Counselor from 12/23/2022 in [facility] RISK CATEGORY No Risk No Risk No Risk       Last [facility] 2/9 Scores:    12/23/2022    2:02 PM  Depression screen PHQ 2/9  Decreased Interest 1  Down, Depressed, Hopeless 1  PHQ - 2 Score 2  Altered sleeping 3  Tired, decreased energy 3  Change in appetite 3  Feeling bad or failure about yourself  0  Trouble concentrating 0  Moving slowly or fidgety/restless 0  Suicidal thoughts 0  PHQ-9 Score 11  Difficult doing work/chores Not difficult at all    Scribe for Treatment Team: Isabella Bowens, LCSWA 04/22/2023 2:12 PM

## 2023-04-22 NOTE — Plan of Care (Signed)

## 2023-04-22 NOTE — Progress Notes (Signed)
Adult Psychoeducational Group Note  Date:  04/22/2023 Time:  9:53 PM  Group Topic/Focus:  Wrap-Up Group:   The focus of this group is to help patients review their daily goal of treatment and discuss progress on daily workbooks.  Participation Level:  Active  Participation Quality:  Appropriate  Affect:  Appropriate  Cognitive:  Appropriate  Insight: Appropriate  Engagement in Group:  Engaged  Modes of Intervention:  Discussion  Additional Comments:  The purpose of the group was to remember something about the other patient color of the day and Shiyan was able to do that.  Charna Busman Long 04/22/2023, 9:53 PM

## 2023-04-22 NOTE — Group Note (Signed)
Date:  04/22/2023 Time:  9:04 AM  Group Topic/Focus:  Orientation:   The focus of this group is to educate the patient on the purpose and policies of crisis stabilization and provide a format to answer questions about their admission.  The group details unit policies and expectations of patients while admitted.    Participation Level:  Active  Participation Quality:  Appropriate  Affect:  Appropriate  Cognitive:  Alert  Insight: Appropriate  Engagement in Group:  Engaged  Modes of Intervention:  Orientation  Additional Comments:    Beckie Busing 04/22/2023, 9:04 AM
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
## 2023-04-22 NOTE — Progress Notes (Signed)
   04/21/23 2300  Psych Admission Type (Psych Patients Only)  Admission Status Voluntary  Psychosocial Assessment  Patient Complaints Crying spells;Depression;Appetite decrease;Hopelessness;Sadness;Self-harm thoughts;Insomnia  Eye Contact Brief  Facial Expression Fixed smile  Affect Appropriate to circumstance  Speech Logical/coherent  Interaction Assertive  Motor Activity Other (Comment) (steady)  Appearance/Hygiene In scrubs  Behavior Characteristics Cooperative;Appropriate to situation  Mood Depressed  Thought Process  Coherency WDL  Content WDL  Delusions None reported or observed  Perception WDL  Hallucination None reported or observed  Judgment WDL  Confusion None  Danger to Self  Current suicidal ideation?  (denies)  Description of Suicide Plan  (denies)  Self-Injurious Behavior Actively performing acts reported or observed  Agreement Not to Harm Self Yes  Description of Agreement verbal  Danger to Others  Danger to Others None reported or observed

## 2023-04-22 NOTE — Plan of Care (Signed)
  Problem: Education: Goal: Emotional status will improve Outcome: Progressing Goal: Mental status will improve Outcome: Progressing   

## 2023-04-22 NOTE — Progress Notes (Signed)
   04/22/23 1000  Psych Admission Type (Psych Patients Only)  Admission Status Voluntary  Psychosocial Assessment  Patient Complaints Anxiety;Depression  Eye Contact Brief  Facial Expression Fixed smile  Affect Appropriate to circumstance  Speech Logical/coherent  Interaction Assertive  Motor Activity Other (Comment) (WDL)  Appearance/Hygiene Unremarkable  Behavior Characteristics Cooperative;Appropriate to situation  Mood Depressed  Thought Process  Coherency WDL  Content WDL  Delusions None reported or observed  Perception WDL  Hallucination None reported or observed  Judgment Impaired  Confusion None  Danger to Self  Current suicidal ideation? Denies  Description of Suicide Plan No plan  Self-Injurious Behavior No self-injurious ideation or behavior indicators observed or expressed   Agreement Not to Harm Self Yes  Description of Agreement Verbal  Danger to Others  Danger to Others None reported or observed

## 2023-04-22 NOTE — H&P (Signed)
Psychiatric Admission Assessment Adult  Patient Identification: [name] MRN:  [id number] Date of Evaluation:  04/22/2023 Chief Complaint:  MDD (major depressive disorder), recurrent episode, moderate (HCC) [F33.1] Principal Diagnosis: MDD (major depressive disorder), recurrent episode, moderate (HCC) Diagnosis:  Principal Problem:   MDD (major depressive disorder), recurrent episode, moderate (HCC) Active Problems:   GAD (generalized anxiety disorder)  History of Present Illness: Shannon David is a 32 y.o. female who has a past medical history of COVID, History of Clostridium difficile infection (01/2023), History of hypothyroidism, History of PCOS, Major depressive disorder, recurrent severe without psychotic features (HCC), Passive suicidal ideations (11/11/2022), Rheumatoid arthritis (HCC), and Suicidal behavior with attempted self-injury (HCC) (04/22/2023). She presented from an outside hospital for MDD (major depressive disorder), recurrent episode, moderate (HCC) [F33.1].  She reported that she and her husband had an argument Monday and decided to separate on Tuesday.  She reports that she got to work on Wednesday and learn from her colleagues that her husband had posted personal information which had been seen and discussed by her coworkers.  This caused her to feel shame, embarrassment, and humiliation.  She reports that she impulsively took scissors from her pouch and superficially scratched her wrist.  She states that she is not sure if she was trying to kill her self, but felt like she wanted to escape, and "not be there anymore".  She reports that she has numerous psychosocial stressors in her life including school, work, raising 2 stepdaughters with minimal help from her husband.  Associated Signs/Symptoms: Depression Symptoms:  depressed mood, fatigue, suicidal attempt, anxiety, loss of energy/fatigue, (Hypo) Manic Symptoms:  Distractibility, Anxiety Symptoms:  Excessive  Worry, Psychotic Symptoms:   None PTSD Symptoms: Negative Total Time spent with patient: 1 hour  Past Psychiatric History: Patient reports that this is her first inpatient psychiatric admission.  She denies previous suicide attempts.  She reports that she did spend the night in a behavioral health urgent care in May.  She reports that she has been seeing a therapist every 2 weeks and is seeing a psychiatrist once and has an upcoming follow-up appointment.  She is currently taking Paxil 60 mg, which was increased in recent months.  She reports that she was doing relatively well prior to the separation from her husband and subsequent Internet posting seen by her coworkers.  She reports that she had a trial of Cymbalta which caused her to have "bad thoughts".  She denies taking other psychiatric medications.  She denies any previous history of self-harm.  Is the patient at risk to self? Yes.    Has the patient been a risk to self in the past 6 months? Yes.    Has the patient been a risk to self within the distant past? No.  Is the patient a risk to others? No.  Has the patient been a risk to others in the past 6 months? No.  Has the patient been a risk to others within the distant past? No.   [place] Scale:  Flowsheet Row [facility]) [facility]09/2023 in [facility] Counselor from 12/23/2022 in [facility] at [facility] RISK CATEGORY No Risk No Risk No Risk        Prior Inpatient Therapy: No.  Prior Outpatient Therapy: Yes, as detailed in her past psychiatric history.  Alcohol Screening: Patient refused Alcohol Screening Tool: Yes 1. How often do  you have a drink containing alcohol?: Never 2. How many drinks containing alcohol do you have on a typical day when you are drinking?: 1 or 2 3. How often do you have six or more drinks on one  occasion?: Never AUDIT-C Score: 0 4. How often during the last year have you found that you were not able to stop drinking once you had started?: Never 5. How often during the last year have you failed to do what was normally expected from you because of drinking?: Never 6. How often during the last year have you needed a first drink in the morning to get yourself going after a heavy drinking session?: Never 7. How often during the last year have you had a feeling of guilt of remorse after drinking?: Never 8. How often during the last year have you been unable to remember what happened the night before because you had been drinking?: Never 9. Have you or someone else been injured as a result of your drinking?: No 10. Has a relative or friend or a doctor or another health worker been concerned about your drinking or suggested you cut down?: No Alcohol Use Disorder Identification Test Final Score (AUDIT): 0 Alcohol Brief Interventions/Follow-up: Patient Refused Substance Abuse History in the last 12 months:  No. Consequences of Substance Abuse: NA Previous Psychotropic Medications: Yes  Psychological Evaluations: No  Past Medical History:  Past Medical History:  Diagnosis Date   COVID    History of Clostridium difficile infection 01/2023   History of hypothyroidism    History of PCOS    Major depressive disorder, recurrent severe without psychotic features (HCC)    Passive suicidal ideations 11/11/2022   Rheumatoid arthritis (HCC)    Suicidal behavior with attempted self-injury (HCC) 04/22/2023    Past Surgical History:  Procedure Laterality Date   BUNIONECTOMY Right    FOOT SURGERY Right 08/24/2021   Family History:  Family History  Problem Relation Age of Onset   Hypertension Mother    Clotting disorder Mother    Rheum arthritis Brother    Lung cancer Paternal Uncle    Cancer Maternal Grandmother        lung   Family Psychiatric  History: The patient reports that her mother  has bipolar 1 disorder and has been hospitalized twice for psychiatric reasons.  The mother takes medications, but the patient is not sure what the medications are.  She reports that the mother also had a history of alcoholism, but is in remission for many years.  She reports that her brother has depression and anxiety and has been to [facility] a few times.  She denies a family history of suicide attempts or substance abuse not previously described. Tobacco Screening:  Social History   Tobacco Use  Smoking Status Never   Passive exposure: Never  Smokeless Tobacco Never    BH Tobacco Counseling     Are you interested in Tobacco Cessation Medications?  No value filed. Counseled patient on smoking cessation:  No value filed. Reason Tobacco Screening Not Completed: No value filed.       Social History: Prior to admission the patient was living with her husband and 2 stepdaughters.  She reports that they have been together for 8 years and married for 3.  She reports that they have been having marital problems for approximately 1 year.  She thinks they will get divorced.  She states that she plans to live with her father after discharge from the hospital.  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  She is currently taking business classes pursuing a human resources degree.  Social History   Substance and Sexual Activity  Alcohol Use No     Social History   Substance and Sexual Activity  Drug Use No    Additional Social History: Marital status: Married Number of Years Married: 3 Separated, when?: In the process of separating, since Sunday 04/17/23 What types of issues is patient dealing with in the relationship?: Pt stated "we don't communicate well and he never takes responsibility for anything that goes wrong" Additional relationship information: None reported What is your sexual orientation?: Heterosexual Has your sexual activity been affected by  drugs, alcohol, medication, or emotional stress?: No Does patient have children?: No How is patient's relationship with their children?: Pt stated "I mean he has 2 kids but they are not mine"                         Allergies:   Allergies  Allergen Reactions   Keflex [Cephalexin] Hives   Lab Results: No results found for this or any previous visit (from the past 48 hour(s)).  Blood Alcohol level:  Lab Results  Component Value Date   ETH <10 11/11/2022    Metabolic Disorder Labs:  No results found for: "HGBA1C", "MPG" No results found for: "PROLACTIN" Lab Results  Component Value Date   CHOL 151 12/28/2022   TRIG 65.0 12/28/2022   HDL 48.50 12/28/2022   CHOLHDL 3 12/28/2022   VLDL 13.0 12/28/2022   LDLCALC 89 12/28/2022   LDLCALC 74 10/18/2022    Current Medications: Current Facility-Administered Medications  Medication Dose Route Frequency Provider Last Rate Last Admin   acetaminophen (TYLENOL) tablet 650 mg  650 mg Oral Q6H PRN [name], NP   650 mg at 04/22/23 0824   albuterol (VENTOLIN HFA) 108 (90 Base) MCG/ACT inhaler 1-2 puff  1-2 puff Inhalation Q6H PRN [name], NP       alum & mag hydroxide-simeth (MAALOX/MYLANTA) 200-200-20 MG/5ML suspension 30 mL  30 mL Oral Q4H PRN [name], NP       folic acid (FOLVITE) tablet 2 mg  2 mg Oral Daily [name], NP   2 mg at 04/22/23 1027   gabapentin (NEURONTIN) capsule 300 mg  300 mg Oral BID [name], NP   300 mg at 04/22/23 2536   hydroxychloroquine (PLAQUENIL) tablet 200 mg  200 mg Oral 2 times per day on Monday Tuesday Wednesday Thursday Friday Otho Bellows, [facility]       levothyroxine (SYNTHROID) tablet 112 mcg  112 mcg Oral Daily [name], NP   112 mcg at 04/22/23 0645   magnesium hydroxide (MILK OF MAGNESIA) suspension 30 mL  30 mL Oral Daily PRN [name], NP       [START ON 04/23/2023] PARoxetine (PAXIL) tablet 60 mg  60 mg Oral Daily Golda Acre, MD        traZODone (DESYREL) tablet 50 mg  50 mg Oral QHS PRN [name], NP   50 mg at 04/21/23 2144   PTA Medications: Medications Prior to Admission  Medication Sig Dispense Refill Last Dose   acetaminophen (TYLENOL) 500 MG tablet Take 1,000 mg by mouth 3 (three) times daily as needed for mild pain (pain score 1-3).      albuterol (VENTOLIN HFA) 108 (  90 Base) MCG/ACT inhaler Inhale 1-2 puffs into the lungs every 6 (six) hours as needed for wheezing or shortness of breath. 18 g 0 Past Week   Cholecalciferol (VITAMIN D3) 50 MCG (2000 UT) capsule Take 2,000 Units by mouth daily.   Past Week   folic acid (FOLVITE) 1 MG tablet Take 2 tablets (2 mg total) by mouth daily. 180 tablet 3 Past Week   gabapentin (NEURONTIN) 300 MG capsule Take 300 mg by mouth 2 (two) times daily.   Past Week   hydroxychloroquine (PLAQUENIL) 200 MG tablet TAKE 1 TABLET BY MOUTH TWICE A DAY MONDAY-FRIDAY 120 tablet 0 Past Week   levothyroxine (SYNTHROID) 112 MCG tablet Take 112 mcg by mouth daily.   Past Week   methotrexate (RHEUMATREX) 2.5 MG tablet TAKE 8 TABLETS (20 MG TOTAL) BY MOUTH ONCE A WEEK. CAUTION:CHEMOTHERAPY. PROTECT FROM LIGHT. (Patient taking differently: Take 20 mg by mouth once a week. TAKE 8 TABLETS (20 MG TOTAL) BY MOUTH ONCE A WEEK. CAUTION:CHEMOTHERAPY. PROTECT FROM LIGHT. Takes every Wednesday) 96 tablet 0    ondansetron (ZOFRAN-ODT) 8 MG disintegrating tablet Take 8 mg by mouth every 8 (eight) hours as needed for nausea or vomiting.   Past Week   PARoxetine (PAXIL) 40 MG tablet Take 60 mg by mouth at bedtime.   Past Week   traMADol (ULTRAM) 50 MG tablet Take 50 mg by mouth 3 (three) times daily as needed for moderate pain (pain score 4-6).   Past Week   traZODone (DESYREL) 50 MG tablet Take 1 tablet (50 mg total) by mouth at bedtime as needed for sleep. 30 tablet 1    ibuprofen (IBU-200) 200 MG tablet Take 400 mg by mouth every 6 (six) hours as needed for mild pain or headache.      naloxone (NARCAN) nasal  spray 4 mg/0.1 mL Place 1 spray into the nose once as needed (AS DIRECTED).      Tocilizumab (ACTEMRA IV) Inject into the vein every 30 (thirty) days. 80 mg/4 ml       Musculoskeletal: Strength & Muscle Tone: within normal limits Gait & Station: normal Patient leans: N/A        Psychiatric Specialty Exam:  Presentation  General Appearance:  Appropriate for Environment  Eye Contact: Good  Speech: Clear and Coherent  Speech Volume: Decreased  Handedness: Right   Mood and Affect  Mood: Anxious; Depressed  Affect: Congruent; Constricted   Thought Process  Thought Processes: Goal Directed; Linear; Coherent  Duration of Psychotic Symptoms: NA Past Diagnosis of Schizophrenia or Psychoactive disorder: No  Descriptions of Associations:Intact  Orientation:Full (Time, Place and Person)  Thought Content:Logical  Hallucinations:Hallucinations: None  Ideas of Reference:None  Suicidal Thoughts:Suicidal Thoughts: Yes, Passive SI Passive Intent and/or Plan: Without Intent  Homicidal Thoughts:Homicidal Thoughts: No   Sensorium  Memory: Immediate Good; Recent Good; Remote Good  Judgment: Fair  Insight: Fair   Art therapist  Concentration: Good  Attention Span: Good  Recall: Good  Fund of Knowledge: Good  Language: Good   Psychomotor Activity  Psychomotor Activity: Psychomotor Activity: Decreased   Assets  Assets: Communication Skills; Desire for Improvement; Financial Resources/Insurance   Sleep  Sleep: Sleep: Fair    Physical Exam: Physical Exam: General: Sitting comfortably. NAD. HEENT: Normocephalic, atraumatic, MMM, EMOI Lungs: no increased work of breathing noted Heart: no cyanosis Abdomen: Non distended Musculoskeletal: FROM. No obvious deformities Skin: Warm, dry, intact. No rashes noted Neuro: No obvious focal deficits.  Gait and station are normal  Review  of Systems  Constitutional: Negative.   HENT:  Negative.    Eyes: Negative.   Respiratory: Negative.    Cardiovascular: Negative.   Gastrointestinal: Negative.   Genitourinary: Negative.   Skin: Negative.   Neurological: Negative.   Psychiatric/Behavioral:  Positive for depression.    Blood pressure (!) 133/96, pulse 98, temperature 98.3 F (36.8 C), temperature source Oral, resp. rate 18, height 5\' 2"  (1.575 m), weight 122.5 kg, last menstrual period 04/01/2023, SpO2 99%. Body mass index is 49.38 kg/m.  Treatment Plan Summary: Daily contact with patient to assess and evaluate symptoms and progress in treatment and Medication management  Observation Level/Precautions:  15 minute checks  Laboratory:  CBC Chemistry Profile Folic Acid UDS Vitamin B-12  Psychotherapy:  Group  Medications:   Current Facility-Administered Medications:    acetaminophen (TYLENOL) tablet 650 mg, 650 mg, Oral, Q6H PRN, [name], NP, 650 mg at 04/22/23 0824   albuterol (VENTOLIN HFA) 108 (90 Base) MCG/ACT inhaler 1-2 puff, 1-2 puff, Inhalation, Q6H PRN, [name], NP   alum & mag hydroxide-simeth (MAALOX/MYLANTA) 200-200-20 MG/5ML suspension 30 mL, 30 mL, Oral, Q4H PRN, [name], NP   folic acid (FOLVITE) tablet 2 mg, 2 mg, Oral, Daily, Lewis, Tanika N, NP, 2 mg at 04/22/23 1308   gabapentin (NEURONTIN) capsule 300 mg, 300 mg, Oral, BID, Hillery Jacks N, NP, 300 mg at 04/22/23 6578   hydroxychloroquine (PLAQUENIL) tablet 200 mg, 200 mg, Oral, 2 times per day on Monday Tuesday Wednesday Thursday Friday, Otho Bellows, [facility]   levothyroxine (SYNTHROID) tablet 112 mcg, 112 mcg, Oral, Daily, [name], NP, 112 mcg at 04/22/23 0645   magnesium hydroxide (MILK OF MAGNESIA) suspension 30 mL, 30 mL, Oral, Daily PRN, [name], NP   [START ON 04/23/2023] PARoxetine (PAXIL) tablet 60 mg, 60 mg, Oral, Daily, Golda Acre, MD   traZODone (DESYREL) tablet 50 mg, 50 mg, Oral, QHS PRN, [name], NP, 50 mg at 04/21/23 2144    Consultations:  None  Discharge Concerns:  None  EReview of Systems  Constitutional: Negative.   HENT: Negative.    Eyes: Negative.   Respiratory: Negative.    Cardiovascular: Negative.   Gastrointestinal: Negative.   Genitourinary: Negative.   Skin: Negative.   Neurological: Negative.   Psychiatric/Behavioral:  Positive for depression.  stimated LOS: 3-5 days  Other:  NA   Physician Treatment Plan for Primary Diagnosis: MDD (major depressive disorder), recurrent episode, moderate (HCC)  ASSESSMENT: Shannon David is an 32 y.o. female with a past medical and psychiatric history of  has a past medical history of COVID, History of Clostridium difficile infection (01/2023), History of hypothyroidism, History of PCOS, Major depressive disorder, recurrent severe without psychotic features (HCC), Passive suicidal ideations (11/11/2022), Rheumatoid arthritis (HCC), and Suicidal behavior with attempted self-injury (HCC) (04/22/2023)..  She presented on 04/21/2023  6:55 PM for MDD (major depressive disorder), recurrent episode, moderate (HCC).    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   Major depressive disorder, recurrent episode, moderate degree (HCC) 11/11/2022   Pain in left knee 06/04/2021   History of PCOS 02/21/2017   High risk medication use 09/21/2016   Rheumatoid arthritis involving multiple sites with positive rheumatoid factor (HCC) 09/21/2016   History of hypothyroidism 09/21/2016   History  of depression 09/21/2016     PLAN: Safety and Monitoring:  -- Voluntary admission to inpatient psychiatric unit for safety, stabilization and treatment  -- Daily contact with patient to assess and evaluate symptoms and progress in treatment  -- Patient's case to be  discussed in multi-disciplinary team meeting  -- Observation Level : q15 minute checks  -- Vital signs:  q12 hours  -- Precautions: suicide, elopement, and assault  2. Psychiatric Diagnoses and Treatment:   -- Continue Paxil 60 mg daily for mood and anxiety  3. Medical Issues Being Addressed:    Labs reviewed, unremarkable with the exception of: Mild hyponatremia of 133, mild hypocalcemia of 8   Tobacco Use Disorder  --  Patient does not need nicotine replacement  -- Smoking cessation encouraged  4. Discharge Planning:   -- Social work and case management to assist with discharge planning and identification of hospital follow-up needs prior to discharge  -- Estimated LOS: 3-5 days  -- Discharge Concerns: Need to establish a safety plan; Medication compliance and effectiveness  -- Discharge Goals: Return home with outpatient referrals for mental health follow-up including medication management/psychotherapy  5. Short Term Goals:  Improve ability to identify changes in lifestyle to reduce recurrence of condition, verbalize feelings, disclose and discuss suicidal ideas, demonstrate self-control, identify and develop effective coping behaviors, compliance with prescribed medications, identify triggers associated with substance abuse/mental health issues, participate in unit milieu and in scheduled group therapies   6. Long Term Goals: Improvement in symptoms so the patient is ready for discharge   --The risks/benefits/side-effects/alternatives to the medications above were discussed in detail with the patient and time was given for questions. The patient provided informed consent.       Total Time Spent in Direct Patient Care:  I personally spent 60 minutes on the unit in direct patient care. The direct patient care time included face-to-face time with the patient, reviewing the patient's chart, communicating with other professionals, and coordinating care. Greater than 50% of this time  was spent in counseling or coordinating care with the patient regarding goals of hospitalization, psycho-education, and discharge planning needs.       Portions of this note were created using voice recognition software. Minor syntax errors, grammatical content, spelling, or punctuation errors may have occurred unintentionally. Please notify the Thereasa Parkin if the meaning of any statement is unclear.   I certify that inpatient services furnished can reasonably be expected to improve the patient's condition.    Golda Acre, MD 10/25/202412:48 PM

## 2023-04-22 NOTE — Group Note (Signed)
Date:  04/22/2023 Time:  1:00 PM  Group Topic/Focus:  Developing a Wellness Toolbox:   The focus of this group is to help patients develop a "wellness toolbox" with skills and strategies to promote recovery upon discharge.    Participation Level:  Active  Participation Quality:  Appropriate  Affect:  Appropriate  Cognitive:  Alert  Insight: Good  Engagement in Group:  Engaged  Modes of Intervention:  Education  Additional Comments:    Beckie Busing 04/22/2023, 1:00 PM

## 2023-04-22 NOTE — Group Note (Signed)
Date:  04/22/2023 Time:  12:03 PM  Group Topic/Focus:  Goals Group:   The focus of this group is to help patients establish daily goals to achieve during treatment and discuss how the patient can incorporate goal setting into their daily lives to aide in recovery.    Participation Level:  Active  Participation Quality:  Appropriate  Affect:  Appropriate  Cognitive:  Alert  Insight: Good  Engagement in Group:  Engaged  Modes of Intervention:  Discussion  Additional Comments:  Pt stated that her goal was to get the suicidal thoughts out her head.   Beckie Busing 04/22/2023, 12:03 PM

## 2023-04-22 NOTE — Group Note (Signed)
Recreation Therapy Group Note   Group Topic:Team Building  Group Date: 04/22/2023 Start Time: 0935 End Time: 0955 Facilitators: Rodman Recupero-McCall, LRT,CTRS Location: [address] [employer] Therapy Notes  Group Topic: Team Building, Problem Solving  Goal Area(s) Addresses:  Patient will effectively work with peer towards shared goal.  Patient will identify skills used to make activity successful.  Patient will identify how skills used during activity can be applied to reach post d/c goals.   Intervention: STEM Activity- Glass blower/designer  Group Description: Tallest Pharmacist, community. In teams of 5-6, patients were given 11 craft pipe cleaners. Using the materials provided, patients were instructed to compete again the opposing team(s) to build the tallest free-standing structure from floor level. The activity was timed; difficulty increased by Clinical research associate as Production designer, theatre/television/film continued.  Systematically resources were removed with additional directions for example, placing one arm behind their back, working in silence, and shape stipulations. LRT facilitated post-activity discussion reviewing team processes and necessary communication skills involved in completion. Patients were encouraged to reflect how the skills utilized, or not utilized, in this activity can be incorporated to positively impact support systems post discharge.  Education: Pharmacist, community, Scientist, physiological, Discharge Planning   Education Outcome: Acknowledges education/In group clarification offered/Needs additional education.    Affect/Mood: N/A   Participation Level: Did not attend    Clinical Observations/Individualized Feedback:    Plan: Continue to engage patient in RT group sessions 2-3x/week.   Amori Cooperman-McCall, LRT,CTRS  04/22/2023 11:49 AM

## 2023-04-22 NOTE — Tx Team (Signed)
Initial Treatment Plan 04/22/2023 4:27 AM [name] ZOX:[id number]    PATIENT STRESSORS: Loss of a relationship     PATIENT STRENGTHS: Ability for insight  Average or above average intelligence  Capable of independent living  Motivation for treatment/growth  Supportive family/friends    PATIENT IDENTIFIED PROBLEMS: Separation from husband                     DISCHARGE CRITERIA:  Adequate post-discharge living arrangements Improved stabilization in mood, thinking, and/or behavior Reduction of life-threatening or endangering symptoms to within safe limits Safe-care adequate arrangements made  PRELIMINARY DISCHARGE PLAN: Return to previous living arrangement Return to previous work or school arrangements  PATIENT/FAMILY INVOLVEMENT: This treatment plan has been presented to and reviewed with the patient, Shannon David.The patient has been given the opportunity to ask questions and make suggestions.  Lahoma Crocker, RN 04/22/2023, 4:27 AM

## 2023-04-23 DIAGNOSIS — F331 Major depressive disorder, recurrent, moderate: Secondary | ICD-10-CM | POA: Diagnosis not present

## 2023-04-23 LAB — RPR: RPR Ser Ql: NONREACTIVE

## 2023-04-23 MED ORDER — TRAZODONE HCL 100 MG PO TABS
100.0000 mg | ORAL_TABLET | Freq: Every evening | ORAL | Status: DC | PRN
  Administered 2023-04-23 – 2023-04-25 (×3): 100 mg via ORAL
  Filled 2023-04-23 (×3): qty 1

## 2023-04-23 MED ORDER — PROPRANOLOL HCL 10 MG PO TABS
10.0000 mg | ORAL_TABLET | Freq: Two times a day (BID) | ORAL | Status: DC
  Administered 2023-04-23 – 2023-04-26 (×7): 10 mg via ORAL
  Filled 2023-04-23 (×10): qty 1

## 2023-04-23 NOTE — Progress Notes (Signed)
   04/23/23 1253  Psych Admission Type (Psych Patients Only)  Admission Status Voluntary  Psychosocial Assessment  Patient Complaints Anxiety;Depression  Eye Contact Fair  Facial Expression Flat  Affect Appropriate to circumstance  Speech Logical/coherent  Interaction Assertive;Minimal  Motor Activity Slow  Appearance/Hygiene Unremarkable  Behavior Characteristics Cooperative;Appropriate to situation  Mood Depressed  Thought Process  Coherency WDL  Content WDL  Delusions None reported or observed  Perception WDL  Hallucination None reported or observed  Judgment Impaired  Confusion None  Danger to Self  Current suicidal ideation? Denies  Description of Suicide Plan n  Self-Injurious Behavior No self-injurious ideation or behavior indicators observed or expressed   Agreement Not to Harm Self Yes  Description of Agreement verbal  Danger to Others  Danger to Others None reported or observed

## 2023-04-23 NOTE — Progress Notes (Signed)
   04/22/23 2032  Psych Admission Type (Psych Patients Only)  Admission Status Voluntary  Psychosocial Assessment  Patient Complaints Anxiety;Depression  Eye Contact Brief  Facial Expression Fixed smile  Affect Appropriate to circumstance  Speech Logical/coherent  Interaction Assertive;Minimal  Motor Activity Slow  Appearance/Hygiene Unremarkable  Behavior Characteristics Cooperative;Appropriate to situation  Mood Depressed  Thought Process  Coherency WDL  Content WDL  Delusions None reported or observed  Perception WDL  Hallucination None reported or observed  Judgment Impaired  Confusion None  Danger to Self  Current suicidal ideation? Denies  Self-Injurious Behavior No self-injurious ideation or behavior indicators observed or expressed   Agreement Not to Harm Self Yes  Description of Agreement verbal  Danger to Others  Danger to Others None reported or observed

## 2023-04-23 NOTE — Plan of Care (Signed)
?  Problem: Education: ?Goal: Knowledge of  General Education information/materials will improve ?Outcome: Progressing ?Goal: Emotional status will improve ?Outcome: Progressing ?Goal: Mental status will improve ?Outcome: Progressing ?Goal: Verbalization of understanding the information provided will improve ?Outcome: Progressing ?  ?Problem: Activity: ?Goal: Interest or engagement in activities will improve ?Outcome: Progressing ?Goal: Sleeping patterns will improve ?Outcome: Progressing ?  ?Problem: Coping: ?Goal: Ability to verbalize frustrations and anger appropriately will improve ?Outcome: Progressing ?Goal: Ability to demonstrate self-control will improve ?Outcome: Progressing ?  ?Problem: Health Behavior/Discharge Planning: ?Goal: Identification of resources available to assist in meeting health care needs will improve ?Outcome: Progressing ?Goal: Compliance with treatment plan for underlying cause of condition will improve ?Outcome: Progressing ?  ?Problem: Safety: ?Goal: Periods of time without injury will increase ?Outcome: Progressing ?  ?

## 2023-04-23 NOTE — Progress Notes (Signed)
[facility] MD Progress Note  04/23/2023 12:14 PM Shannon David  MRN:  [id number] Subjective:   Shannon David is a 32 yr old female who was admitted to [facility] on 10/25 after an episode of Self Harm (scratching arms with scissors) due to worsening family and work stress.  PPHx is significant for MDD, and no history of Suicide Attempts, Prior episodes of Self Harm, or Prior Suicide Attempts.  Case was discussed in the multidisciplinary team. MAR was reviewed and patient was compliant with medications.  She received PRN Tylenol yesterday.   Psychiatric Team made the following recommendations yesterday: -Continue Paxil 60 mg daily for depression and anxiety    On interview today patient reports she slept fair last night.  She reports her appetite is doing good.  She reports no SI, HI, or AVH.  She reports no Paranoia or Ideas of Reference.  She reports no issues with her medications.  She reports that she is feeling very anxious today.  She reports that part of this may be due to her father coming to visit tonight.  Discussed starting propranolol to address her anxiety and elevated blood pressure.  Discussed potential risks and side effects and she was agreeable to the trial.  She reports that her sleep was not too good last night as she normally takes Benadryl to sleep at night.  Discussed we could trial further increasing her trazodone and she was agreeable with this.  She reports no other concerns at present.  Principal Problem: MDD (major depressive disorder), recurrent episode, moderate (HCC) Diagnosis: Principal Problem:   MDD (major depressive disorder), recurrent episode, moderate (HCC) Active Problems:   GAD (generalized anxiety disorder)  Total Time spent with patient:  I personally spent 35 minutes on the unit in direct patient care. The direct patient care time included face-to-face time with the patient, reviewing the patient's chart, communicating with other professionals, and  coordinating care. Greater than 50% of this time was spent in counseling or coordinating care with the patient regarding goals of hospitalization, psycho-education, and discharge planning needs.   Past Psychiatric History: MDD, and no history of Suicide Attempts, Prior episodes of Self Harm, or Prior Suicide Attempts.  Past Medical History:  Past Medical History:  Diagnosis Date   COVID    History of Clostridium difficile infection 01/2023   History of hypothyroidism    History of PCOS    Major depressive disorder, recurrent severe without psychotic features (HCC)    Passive suicidal ideations 11/11/2022   Rheumatoid arthritis (HCC)    Suicidal behavior with attempted self-injury (HCC) 04/22/2023    Past Surgical History:  Procedure Laterality Date   BUNIONECTOMY Right    FOOT SURGERY Right 08/24/2021   Family History:  Family History  Problem Relation Age of Onset   Hypertension Mother    Clotting disorder Mother    Rheum arthritis Brother    Lung cancer Paternal Uncle    Cancer Maternal Grandmother        lung   Family Psychiatric  History:  Mother- Bipolar Disorder and EtOH Abuse Brother- Depression and Anxiety No Known Suicides  Social History:  Social History   Substance and Sexual Activity  Alcohol Use No     Social History   Substance and Sexual Activity  Drug Use No    Social History   Socioeconomic History   Marital status: Married    Spouse name: Not on file   Number of children: 0   Years of education: Not on  file   Highest education level: Not on file  Occupational History   Not on file  Tobacco Use   Smoking status: Never    Passive exposure: Never   Smokeless tobacco: Never  Vaping Use   Vaping status: Never Used  Substance and Sexual Activity   Alcohol use: No   Drug use: No   Sexual activity: Not on file  Other Topics Concern   Not on file  Social History Narrative   Left Handed    Lives in a one story home   Drinks Caffeine     Social Determinants of Health   Financial Resource Strain: Not on file  Food Insecurity: Patient Declined (04/21/2023)   Hunger Vital Sign    Worried About Running Out of Food in the Last Year: Patient declined    Ran Out of Food in the Last Year: Patient declined  Transportation Needs: No Transportation Needs (04/21/2023)   PRAPARE - Administrator, Civil Service (Medical): No    Lack of Transportation (Non-Medical): No  Physical Activity: Not on file  Stress: Not on file  Social Connections: Not on file   Additional Social History:                         Sleep: Fair  Appetite:  Good  Current Medications: Current Facility-Administered Medications  Medication Dose Route Frequency Provider Last Rate Last Admin   acetaminophen (TYLENOL) tablet 650 mg  650 mg Oral Q6H PRN [name], NP   650 mg at 04/23/23 1010   albuterol (VENTOLIN HFA) 108 (90 Base) MCG/ACT inhaler 1-2 puff  1-2 puff Inhalation Q6H PRN [name], NP       alum & mag hydroxide-simeth (MAALOX/MYLANTA) 200-200-20 MG/5ML suspension 30 mL  30 mL Oral Q4H PRN [name], NP       folic acid (FOLVITE) tablet 2 mg  2 mg Oral Daily [name], NP   2 mg at 04/23/23 0756   gabapentin (NEURONTIN) capsule 300 mg  300 mg Oral BID [name], NP   300 mg at 04/23/23 0756   hydroxychloroquine (PLAQUENIL) tablet 200 mg  200 mg Oral 2 times per day on Monday Tuesday Wednesday Thursday Friday Otho Bellows, RPH   200 mg at 04/22/23 2109   levothyroxine (SYNTHROID) tablet 112 mcg  112 mcg Oral Daily [name], NP   112 mcg at 04/23/23 8295   magnesium hydroxide (MILK OF MAGNESIA) suspension 30 mL  30 mL Oral Daily PRN [name], NP       methotrexate (RHEUMATREX) tablet 20 mg  20 mg Oral Weekly Green, Terri L, RPH   20 mg at 04/22/23 1607   ondansetron (ZOFRAN-ODT) disintegrating tablet 4 mg  4 mg Oral Q8H PRN Starleen Blue, NP       PARoxetine (PAXIL) tablet 60 mg  60  mg Oral Daily Golda Acre, MD   60 mg at 04/23/23 0757   propranolol (INDERAL) tablet 10 mg  10 mg Oral BID Lauro Franklin, MD   10 mg at 04/23/23 1008   traZODone (DESYREL) tablet 100 mg  100 mg Oral QHS PRN Lauro Franklin, MD        Lab Results:  Results for orders placed or performed during the hospital encounter of 04/21/23 (from the past 48 hour(s))  VITAMIN D 25 Hydroxy (Vit-D Deficiency, Fractures)     Status: None  Collection Time: 04/22/23  6:27 PM  Result Value Ref Range   Vit D, 25-Hydroxy 40.46 30 - 100 ng/mL    Comment: (NOTE) Vitamin D deficiency has been defined by the Institute of Medicine  and an Endocrine Society practice guideline as a level of serum 25-OH  vitamin D less than 20 ng/mL (1,2). The Endocrine Society went on to  further define vitamin D insufficiency as a level between 21 and 29  ng/mL (2).  1. IOM (Institute of Medicine). 2010. Dietary reference intakes for  calcium and D. [place]: [employer]. 2. Holick MF, Bink[place], Bischoff-Ferrari HA, et al. Evaluation,  treatment, and prevention of vitamin D deficiency: an Endocrine  Society clinical practice guideline, JCEM. 2011 Jul; 96(7): 1911-30.  Performed at [facility], [address], [place], [place] [address]   Folate     Status: None   Collection Time: 04/22/23  6:27 PM  Result Value Ref Range   Folate 39.9 >5.9 ng/mL    Comment: RESULT CONFIRMED BY MANUAL DILUTION Performed at [facility], [address], [place], [place] [address]   Vitamin B12     Status: None   Collection Time: 04/22/23  6:27 PM  Result Value Ref Range   Vitamin B-12 374 180 - 914 pg/mL    Comment: (NOTE) This assay is not validated for testing neonatal or myeloproliferative syndrome specimens for Vitamin B12 levels. Performed at [facility], [address] [place], [place] [address]   RPR     Status: None   Collection Time:  04/22/23  6:27 PM  Result Value Ref Range   RPR Ser Ql NON REACTIVE NON REACTIVE    Comment: Performed at [facility], [address] [place], [place] [address]    Blood Alcohol level:  Lab Results  Component Value Date   ETH <10 11/11/2022    Metabolic Disorder Labs: No results found for: "HGBA1C", "MPG" No results found for: "PROLACTIN" Lab Results  Component Value Date   CHOL 151 12/28/2022   TRIG 65.0 12/28/2022   HDL 48.50 12/28/2022   CHOLHDL 3 12/28/2022   VLDL 13.0 12/28/2022   LDLCALC 89 12/28/2022   LDLCALC 74 10/18/2022    Physical Findings: AIMS:  , ,  ,  ,    CIWA:    COWS:     Musculoskeletal: Strength & Muscle Tone: within normal limits Gait & Station: normal Patient leans: N/A  Psychiatric Specialty Exam:  Presentation  General Appearance:  Appropriate for Environment; Casual  Eye Contact: Good  Speech: Clear and Coherent; Normal Rate  Speech Volume: Normal  Handedness: Right   Mood and Affect  Mood: Anxious; Dysphoric  Affect: Congruent   Thought Process  Thought Processes: Coherent; Goal Directed  Descriptions of Associations:Intact  Orientation:Full (Time, Place and Person)  Thought Content:Logical; WDL  History of Schizophrenia/Schizoaffective disorder:No  Duration of Psychotic Symptoms:No data recorded Hallucinations:Hallucinations: None  Ideas of Reference:None  Suicidal Thoughts:Suicidal Thoughts: No SI Passive Intent and/or Plan: Without Intent  Homicidal Thoughts:Homicidal Thoughts: No   Sensorium  Memory: Immediate Fair; Recent Fair  Judgment: Fair  Insight: Fair   Art therapist  Concentration: Good  Attention Span: Good  Recall: Good  Fund of Knowledge: Good  Language: Good   Psychomotor Activity  Psychomotor Activity: Psychomotor Activity: Normal   Assets  Assets: Communication Skills; Desire for Improvement; Financial Resources/Insurance;  Resilience   Sleep  Sleep: Sleep: Good Number of Hours  of Sleep: 8.25    Physical Exam: Physical Exam Vitals and nursing note reviewed.  Constitutional:      General: She is not in acute distress.    Appearance: Normal appearance. She is obese. She is not ill-appearing or toxic-appearing.  HENT:     Head: Normocephalic and atraumatic.  Pulmonary:     Effort: Pulmonary effort is normal.  Musculoskeletal:        General: Normal range of motion.  Neurological:     General: No focal deficit present.     Mental Status: She is alert.    Review of Systems  Respiratory:  Negative for cough and shortness of breath.   Cardiovascular:  Negative for chest pain.  Gastrointestinal:  Negative for abdominal pain, constipation, diarrhea, nausea and vomiting.  Neurological:  Negative for dizziness, weakness and headaches.  Psychiatric/Behavioral:  Positive for depression. Negative for hallucinations and suicidal ideas. The patient is nervous/anxious.    Blood pressure (!) 136/100, pulse (!) 105, temperature 98.2 F (36.8 C), temperature source Oral, resp. rate 20, height 5\' 2"  (1.575 m), weight 122.5 kg, last menstrual period 04/01/2023, SpO2 98%. Body mass index is 49.38 kg/m.   Treatment Plan Summary: Daily contact with patient to assess and evaluate symptoms and progress in treatment and Medication management  Shannon David is a 31 yr old female who was admitted to [facility] on 10/25 after an episode of Self Harm (scratching arms with scissors) due to worsening family and work stress.  PPHx is significant for MDD, and no history of Suicide Attempts, Prior episodes of Self Harm, or Prior Suicide Attempts.    Shannon continues to have significant anxiety.  Given this and her hypertension we will start propranolol to address both of these issues.  We will not make any other changes to her medications at this time.  We will continue to monitor.   MDD, Recurrent, Severe, w/out Psychosis   GAD: -Continue Paxil 60 mg daily for depression and anxiety -Start Propanolol 10 mg BID for anxiety and HTN   RA: -Continue Methotrexate 20 mg weekly -Continue Hydroxychloroquine 200 mg BID on week days -Continue Folic Acid 2 mg daily   Asthma: -Continue Albuterol 1-2 puffs q6 PRN   -Continue Gabapentin 300 mg BID -Continue Synthroid 112 mcg daily -Continue Zofran-ODT 4 mg q8 PRN nausea -Increase PRN Trazodone to 100 mg -Continue PRN's: Tylenol, Maalox, Atarax, Milk of Magnesia   Lauro Franklin, MD 04/23/2023, 12:14 PM

## 2023-04-23 NOTE — Group Note (Signed)
Date:  04/23/2023 Time:  3:45 PM  Group Topic/Focus:  Rediscovering Joy:   The focus of this group is to engage in a team building activity to discover joy and working together as a team to achieve a goal.    Participation Level:  Active  Participation Quality:  Appropriate, Attentive, and Supportive  Affect:  Appropriate  Cognitive:  Alert and Appropriate  Insight: Appropriate and Good  Engagement in Group:  Engaged and Supportive  Modes of Intervention:  Activity and Socialization  Additional Comments:  Patients formed 2 teams to compete in a trivia challenge. Shannon David engaged with her team, answered questions, and supported her team, as well as the other.  [name] 04/23/2023, 3:45 PM

## 2023-04-23 NOTE — Group Note (Signed)
Date:  04/23/2023 Time:  10:33 PM  Group Topic/Focus:  Wrap-Up Group:   The focus of this group is to help patients review their daily goal of treatment and discuss progress on daily workbooks.    Participation Level:  Active  Participation Quality:  Appropriate  Affect:  Appropriate  Cognitive:  Appropriate  Insight: Appropriate  Engagement in Group:  Engaged  Modes of Intervention:  Socialization  Additional Comments:  Pt attended the evening wrap-up group. Tech introduced the staff for the evening, reminded group of the evening schedule and reminded them to ask for anything they need. PT worked on Education officer, community through music.  Osa Craver 04/23/2023, 10:33 PM

## 2023-04-24 DIAGNOSIS — F331 Major depressive disorder, recurrent, moderate: Secondary | ICD-10-CM

## 2023-04-24 MED ORDER — IBUPROFEN 400 MG PO TABS
400.0000 mg | ORAL_TABLET | Freq: Four times a day (QID) | ORAL | Status: DC | PRN
  Administered 2023-04-25: 400 mg via ORAL
  Filled 2023-04-24: qty 1

## 2023-04-24 NOTE — [facility] Group Notes (Signed)
Type of Therapy and Topic:  Group Therapy:Gratitude  Participation Level:  Did Not Attend   Description of Group:   In this group, patients shared and discussed the importance of acknowledging the elements in their lives for which they are grateful and how this can positively impact their mood.  The group discussed how bringing the positive elements of their lives to the forefront of their minds can help with recovery from any illness, physical or mental.  An exercise was done as a group in which a list was made of gratitude items in order to encourage participants to consider other potential positives in their lives.  Therapeutic Goals: Patients will identify one or more item for which they are grateful in each of 6 categories:  people, experiences, things, places, skills, and other. Patients will discuss how it is possible to seek out gratitude in even bad situations. Patients will explore other possible items of gratitude that they could remember.   Summary of Patient Progress: NA  Therapeutic Modalities:   Solution-Focused Therapy Activity

## 2023-04-24 NOTE — [facility] Group Notes (Addendum)
[facility] Group Notes:  (Nursing/MHT/Case Management/Adjunct)  Date:  04/24/2023  Time:  2:07 PM  Type of Therapy:  Psychoeducational Skills  Participation Level:  Active  Participation Quality:  Appropriate and Attentive  Affect:  Appropriate  Cognitive:  Appropriate  Insight:  Appropriate  Engagement in Group:  Engaged  Modes of Intervention:  Discussion, Education, and Exploration  Summary of Progress/Problems: Patients were given education on positive reframing and how to identify the impact of negative thinking patterns on mental health. Additionally, a podcast was play by Dr. Yetta Glassman on the impact of negative beliefs. Pt attended and was appropriate.

## 2023-04-24 NOTE — Progress Notes (Signed)
   04/23/23 2044  Psych Admission Type (Psych Patients Only)  Admission Status Voluntary  Psychosocial Assessment  Patient Complaints Anxiety;Depression  Eye Contact Fair  Facial Expression Fixed smile  Affect Appropriate to circumstance  Speech Logical/coherent  Interaction Assertive  Motor Activity Slow  Appearance/Hygiene Unremarkable  Mood Depressed;Anxious  Thought Process  Coherency WDL  Content WDL  Delusions None reported or observed  Perception WDL  Hallucination None reported or observed  Judgment Impaired  Confusion None  Danger to Self  Current suicidal ideation? Denies  Description of Agreement verbal  Danger to Others  Danger to Others None reported or observed

## 2023-04-24 NOTE — Progress Notes (Signed)
[facility] MD Progress Note  04/24/2023 11:36 AM Shannon David  MRN:  [id number] Subjective:   Shannon David is a 32 yr old female who was admitted to [facility] on 10/25 after an episode of Self Harm (scratching arms with scissors) due to worsening family and work stress.  PPHx is significant for MDD, and no history of Suicide Attempts, Prior episodes of Self Harm, or Prior Suicide Attempts.  Case was discussed in the multidisciplinary team. MAR was reviewed and patient was compliant with medications.  She received PRN Tylenol yesterday.   Psychiatric Team made the following recommendations yesterday: -Continue Paxil 60 mg daily for depression and anxiety -Start Propanolol 10 mg BID for anxiety and HTN   On interview today patient reports she slept good last night, she reports it was the best sleep she has had in a while and slept the whole night.  She reports her appetite is doing good.  She reports no SI, HI, or AVH.  She reports no Paranoia or Ideas of Reference.  She reports no issues with her medications.  She reports that the wall has helped reduce her anxiety and while it is still present it is much improved from yesterday.  She reports that the visit with her father last night went really well.  She reports that her father was very supportive.  She reports no other concerns at present.   Principal Problem: MDD (major depressive disorder), recurrent episode, moderate (HCC) Diagnosis: Principal Problem:   MDD (major depressive disorder), recurrent episode, moderate (HCC) Active Problems:   GAD (generalized anxiety disorder)  Total Time spent with patient:  I personally spent 35 minutes on the unit in direct patient care. The direct patient care time included face-to-face time with the patient, reviewing the patient's chart, communicating with other professionals, and coordinating care. Greater than 50% of this time was spent in counseling or coordinating care with the patient regarding goals  of hospitalization, psycho-education, and discharge planning needs.   Past Psychiatric History: MDD, and no history of Suicide Attempts, Prior episodes of Self Harm, or Prior Suicide Attempts.  Past Medical History:  Past Medical History:  Diagnosis Date   COVID    History of Clostridium difficile infection 01/2023   History of hypothyroidism    History of PCOS    Major depressive disorder, recurrent severe without psychotic features (HCC)    Passive suicidal ideations 11/11/2022   Rheumatoid arthritis (HCC)    Suicidal behavior with attempted self-injury (HCC) 04/22/2023    Past Surgical History:  Procedure Laterality Date   BUNIONECTOMY Right    FOOT SURGERY Right 08/24/2021   Family History:  Family History  Problem Relation Age of Onset   Hypertension Mother    Clotting disorder Mother    Rheum arthritis Brother    Lung cancer Paternal Uncle    Cancer Maternal Grandmother        lung   Family Psychiatric  History:  Mother- Bipolar Disorder and EtOH Abuse Brother- Depression and Anxiety No Known Suicides  Social History:  Social History   Substance and Sexual Activity  Alcohol Use No     Social History   Substance and Sexual Activity  Drug Use No    Social History   Socioeconomic History   Marital status: Married    Spouse name: Not on file   Number of children: 0   Years of education: Not on file   Highest education level: Not on file  Occupational History   Not on  file  Tobacco Use   Smoking status: Never    Passive exposure: Never   Smokeless tobacco: Never  Vaping Use   Vaping status: Never Used  Substance and Sexual Activity   Alcohol use: No   Drug use: No   Sexual activity: Not on file  Other Topics Concern   Not on file  Social History Narrative   Left Handed    Lives in a one story home   Drinks Caffeine    Social Determinants of Health   Financial Resource Strain: Not on file  Food Insecurity: Patient Declined (04/21/2023)    Hunger Vital Sign    Worried About Running Out of Food in the Last Year: Patient declined    Ran Out of Food in the Last Year: Patient declined  Transportation Needs: No Transportation Needs (04/21/2023)   PRAPARE - Administrator, Civil Service (Medical): No    Lack of Transportation (Non-Medical): No  Physical Activity: Not on file  Stress: Not on file  Social Connections: Not on file   Additional Social History:                         Sleep: Good  Appetite:  Good  Current Medications: Current Facility-Administered Medications  Medication Dose Route Frequency Provider Last Rate Last Admin   acetaminophen (TYLENOL) tablet 650 mg  650 mg Oral Q6H PRN [name], NP   650 mg at 04/24/23 0741   albuterol (VENTOLIN HFA) 108 (90 Base) MCG/ACT inhaler 1-2 puff  1-2 puff Inhalation Q6H PRN [name], NP       alum & mag hydroxide-simeth (MAALOX/MYLANTA) 200-200-20 MG/5ML suspension 30 mL  30 mL Oral Q4H PRN [name], NP       folic acid (FOLVITE) tablet 2 mg  2 mg Oral Daily [name], NP   2 mg at 04/24/23 0741   gabapentin (NEURONTIN) capsule 300 mg  300 mg Oral BID [name], NP   300 mg at 04/24/23 0347   hydroxychloroquine (PLAQUENIL) tablet 200 mg  200 mg Oral 2 times per day on Monday Tuesday Wednesday Thursday Friday Otho Bellows, RPH   200 mg at 04/22/23 2109   levothyroxine (SYNTHROID) tablet 112 mcg  112 mcg Oral Daily [name], NP   112 mcg at 04/24/23 4259   magnesium hydroxide (MILK OF MAGNESIA) suspension 30 mL  30 mL Oral Daily PRN [name], NP       methotrexate (RHEUMATREX) tablet 20 mg  20 mg Oral Weekly Green, Terri L, RPH   20 mg at 04/22/23 1607   ondansetron (ZOFRAN-ODT) disintegrating tablet 4 mg  4 mg Oral Q8H PRN Starleen Blue, NP       PARoxetine (PAXIL) tablet 60 mg  60 mg Oral Daily Golda Acre, MD   60 mg at 04/24/23 0741   propranolol (INDERAL) tablet 10 mg  10 mg Oral BID Lauro Franklin, MD   10 mg at 04/24/23 0741   traZODone (DESYREL) tablet 100 mg  100 mg Oral QHS PRN Lauro Franklin, MD   100 mg at 04/23/23 2109    Lab Results:  Results for orders placed or performed during the hospital encounter of 04/21/23 (from the past 48 hour(s))  VITAMIN D 25 Hydroxy (Vit-D Deficiency, Fractures)     Status: None   Collection Time: 04/22/23  6:27 PM  Result Value Ref Range  Vit D, 25-Hydroxy 40.46 30 - 100 ng/mL    Comment: (NOTE) Vitamin D deficiency has been defined by the Institute of Medicine  and an Endocrine Society practice guideline as a level of serum 25-OH  vitamin D less than 20 ng/mL (1,2). The Endocrine Society went on to  further define vitamin D insufficiency as a level between 21 and 29  ng/mL (2).  1. IOM (Institute of Medicine). 2010. Dietary reference intakes for  calcium and D. [place]: [employer]. 2. Holick MF, Binkley [place], Bischoff-Ferrari HA, et al. Evaluation,  treatment, and prevention of vitamin D deficiency: an Endocrine  Society clinical practice guideline, JCEM. 2011 Jul; 96(7): 1911-30.  Performed at [facility], [address] [place], [place] [address]   Folate     Status: None   Collection Time: 04/22/23  6:27 PM  Result Value Ref Range   Folate 39.9 >5.9 ng/mL    Comment: RESULT CONFIRMED BY MANUAL DILUTION Performed at [facility], [address], [place], [place] [address]   Vitamin B12     Status: None   Collection Time: 04/22/23  6:27 PM  Result Value Ref Range   Vitamin B-12 374 180 - 914 pg/mL    Comment: (NOTE) This assay is not validated for testing neonatal or myeloproliferative syndrome specimens for Vitamin B12 levels. Performed at [facility], [address] [place], [place] [address]   RPR     Status: None   Collection Time: 04/22/23  6:27 PM  Result Value Ref Range   RPR Ser Ql NON REACTIVE NON REACTIVE    Comment:  Performed at [facility], [address], [place], [place] [address]    Blood Alcohol level:  Lab Results  Component Value Date   ETH <10 11/11/2022    Metabolic Disorder Labs: No results found for: "HGBA1C", "MPG" No results found for: "PROLACTIN" Lab Results  Component Value Date   CHOL 151 12/28/2022   TRIG 65.0 12/28/2022   HDL 48.50 12/28/2022   CHOLHDL 3 12/28/2022   VLDL 13.0 12/28/2022   LDLCALC 89 12/28/2022   LDLCALC 74 10/18/2022    Physical Findings: AIMS:  , ,  ,  ,    CIWA:    COWS:     Musculoskeletal: Strength & Muscle Tone: within normal limits Gait & Station: normal Patient leans: N/A  Psychiatric Specialty Exam:  Presentation  General Appearance:  Appropriate for Environment; Casual  Eye Contact: Good  Speech: Clear and Coherent; Normal Rate  Speech Volume: Normal  Handedness: Right   Mood and Affect  Mood: Anxious (mild)  Affect: Congruent   Thought Process  Thought Processes: Coherent; Goal Directed  Descriptions of Associations:Intact  Orientation:Full (Time, Place and Person)  Thought Content:WDL; Logical  History of Schizophrenia/Schizoaffective disorder:No  Duration of Psychotic Symptoms:No data recorded Hallucinations:Hallucinations: None  Ideas of Reference:None  Suicidal Thoughts:Suicidal Thoughts: No  Homicidal Thoughts:Homicidal Thoughts: No   Sensorium  Memory: Immediate Fair; Recent Fair  Judgment: Fair  Insight: Fair   Art therapist  Concentration: Good  Attention Span: Good  Recall: Good  Fund of Knowledge: Good  Language: Good   Psychomotor Activity  Psychomotor Activity: Psychomotor Activity: Normal   Assets  Assets: Communication Skills; Desire for Improvement; Financial Resources/Insurance; Resilience   Sleep  Sleep: Sleep: Good Number of Hours of Sleep: 7    Physical Exam: Physical Exam Vitals and nursing note reviewed.  Constitutional:  General: She is not in acute distress.    Appearance: Normal appearance. She is obese. She is not ill-appearing or toxic-appearing.  HENT:     Head: Normocephalic and atraumatic.  Pulmonary:     Effort: Pulmonary effort is normal.  Musculoskeletal:        General: Normal range of motion.  Neurological:     General: No focal deficit present.     Mental Status: She is alert.    Review of Systems  Respiratory:  Negative for cough and shortness of breath.   Cardiovascular:  Negative for chest pain.  Gastrointestinal:  Negative for abdominal pain, constipation, diarrhea, nausea and vomiting.  Neurological:  Negative for dizziness, weakness and headaches.  Psychiatric/Behavioral:  Negative for depression, hallucinations and suicidal ideas. The patient is nervous/anxious (mild).    Blood pressure 121/71, pulse 96, temperature 98.3 F (36.8 C), temperature source Oral, resp. rate 16, height 5\' 2"  (1.575 m), weight 122.5 kg, last menstrual period 04/01/2023, SpO2 97%. Body mass index is 49.38 kg/m.   Treatment Plan Summary: Daily contact with patient to assess and evaluate symptoms and progress in treatment and Medication management  Shannon David is a 32 yr old female who was admitted to [facility] on 10/25 after an episode of Self Harm (scratching arms with scissors) due to worsening family and work stress.  PPHx is significant for MDD, and no history of Suicide Attempts, Prior episodes of Self Harm, or Prior Suicide Attempts.    Weslynn has responded well to the propranolol as she is less anxious today and her blood pressure and heart rate are improved.  We will not make any changes to her medication at this time.  If safety planning can be done could consider discharge early next week.  We will continue to monitor.   MDD, Recurrent, Severe, w/out Psychosis  GAD: -Continue Paxil 60 mg daily for depression and anxiety -Continue Propanolol 10 mg BID for anxiety and  HTN   RA: -Continue Methotrexate 20 mg weekly -Continue Hydroxychloroquine 200 mg BID on week days -Continue Folic Acid 2 mg daily   Asthma: -Continue Albuterol 1-2 puffs q6 PRN   -Continue Gabapentin 300 mg BID -Continue Synthroid 112 mcg daily -Continue Zofran-ODT 4 mg q8 PRN nausea -Increase PRN Trazodone to 100 mg -Continue PRN's: Tylenol, Maalox, Atarax, Milk of Magnesia   Lauro Franklin, MD 04/24/2023, 11:36 AM

## 2023-04-24 NOTE — Plan of Care (Signed)

## 2023-04-24 NOTE — Progress Notes (Signed)
   04/24/23 1500  Psych Admission Type (Psych Patients Only)  Admission Status Voluntary  Psychosocial Assessment  Patient Complaints Anxiety;Depression  Eye Contact Fair  Facial Expression Flat  Affect Appropriate to circumstance  Speech Logical/coherent  Interaction Assertive;Minimal  Motor Activity Slow  Appearance/Hygiene Unremarkable  Behavior Characteristics Cooperative  Mood Depressed;Anxious  Thought Process  Coherency WDL  Content WDL  Delusions None reported or observed  Perception WDL  Hallucination None reported or observed  Judgment Impaired  Confusion None  Danger to Self  Current suicidal ideation? Denies  Self-Injurious Behavior No self-injurious ideation or behavior indicators observed or expressed   Agreement Not to Harm Self Yes  Description of Agreement verbal  Danger to Others  Danger to Others None reported or observed

## 2023-04-24 NOTE — [facility] Group Notes (Signed)
[facility] Group Notes:  (Nursing/MHT/Case Management/Adjunct)  Date:  04/24/2023  Time:  9:33 PM  Type of Therapy:  The focus of this group is to help patients review their daily goal of treatment and discuss progress on daily workbooks.   Participation Level:  Active  Participation Quality:  Appropriate, Sharing, and Supportive  Affect:  Appropriate  Cognitive:  Appropriate  Insight:  Appropriate and Good  Engagement in Group:  Engaged and Supportive  Modes of Intervention:  Discussion, Socialization, and Support  Summary of Progress/Problems: Pt attended group  Shannon David 04/24/2023, 9:33 PM

## 2023-04-25 DIAGNOSIS — F331 Major depressive disorder, recurrent, moderate: Secondary | ICD-10-CM | POA: Diagnosis not present

## 2023-04-25 MED ORDER — GABAPENTIN 300 MG PO CAPS: 300.0000 mg | ORAL_CAPSULE | Freq: Two times a day (BID) | ORAL | 0 refills | Status: DC

## 2023-04-25 MED ORDER — PAROXETINE HCL 30 MG PO TABS: 60.0000 mg | ORAL_TABLET | Freq: Every day | ORAL | 0 refills | Status: DC

## 2023-04-25 MED ORDER — FOLIC ACID 1 MG PO TABS: 2.0000 mg | ORAL_TABLET | Freq: Every day | ORAL | 0 refills | Status: DC

## 2023-04-25 MED ORDER — LEVOTHYROXINE SODIUM 112 MCG PO TABS: 112.0000 ug | ORAL_TABLET | Freq: Every day | ORAL | 0 refills | Status: AC

## 2023-04-25 MED ORDER — ALBUTEROL SULFATE HFA 108 (90 BASE) MCG/ACT IN AERS: 1.0000 | INHALATION_SPRAY | Freq: Four times a day (QID) | RESPIRATORY_TRACT | 0 refills | Status: AC | PRN

## 2023-04-25 MED ORDER — PROPRANOLOL HCL 10 MG PO TABS: 10.0000 mg | ORAL_TABLET | Freq: Two times a day (BID) | ORAL | 0 refills | Status: DC

## 2023-04-25 MED ORDER — TRAZODONE HCL 100 MG PO TABS: 100.0000 mg | ORAL_TABLET | Freq: Every evening | ORAL | 0 refills | Status: DC | PRN

## 2023-04-25 NOTE — [facility] Suicide Risk Assessment (Signed)
[facility] INPATIENT:  Family/Significant Other Suicide Prevention Education  Suicide Prevention Education:  Education Completed; Macarthur Critchley (dad) [phone],  (name of family member/significant other) has been identified by the patient as the family member/significant other with whom the patient will be residing, and identified as the person(s) who will aid the patient in the event of a mental health crisis (suicidal ideations/suicide attempt).  With written consent from the patient, the family member/significant other has been provided the following suicide prevention education, prior to the and/or following the discharge of the patient.  There are guns in the home but they are secured in a locked safe, guns are additionally locked in a secondary safe with separate key. Bullets are not stored with the guns. Pt does not have access to any key/safe locks. Medication is not left out.   Dad will pick pt up at d/c, will be evening d/c due to his work schedule.  The suicide prevention education provided includes the following: Suicide risk factors Suicide prevention and interventions National Suicide Hotline telephone number [facility] assessment telephone number [facility] Emergency Assistance 911 [facility] and/or Residential Mobile Crisis Unit telephone number  Request made of family/significant other to: Remove weapons (e.g., guns, rifles, knives), all items previously/currently identified as safety concern.   Remove drugs/medications (over-the-counter, prescriptions, illicit drugs), all items previously/currently identified as a safety concern.  The family member/significant other verbalizes understanding of the suicide prevention education information provided.  The family member/significant other agrees to remove the items of safety concern listed above.  Kathi Der 04/25/2023, 1:19 PM

## 2023-04-25 NOTE — Group Note (Signed)
Date:  04/25/2023 Time:  4:23 PM  Group Topic/Focus:  Healthy Communication:   The focus of this group is to discuss communication, barriers to communication, as well as healthy ways to communicate with others.    Participation Level:  Active  Participation Quality:  Appropriate  Affect:  Appropriate  Cognitive:  Appropriate  Insight: Appropriate  Engagement in Group:  Engaged  Modes of Intervention:  Exploration  Additional Comments:     Reymundo Poll 04/25/2023, 4:23 PM

## 2023-04-25 NOTE — Progress Notes (Signed)
   04/24/23 1958  Psych Admission Type (Psych Patients Only)  Admission Status Voluntary  Psychosocial Assessment  Patient Complaints Anxiety;Depression  Eye Contact Fair  Facial Expression Flat  Affect Appropriate to circumstance  Speech Logical/coherent  Interaction Assertive  Motor Activity Slow  Appearance/Hygiene Unremarkable  Behavior Characteristics Combative  Mood Depressed;Anxious  Thought Process  Coherency WDL  Content WDL  Delusions None reported or observed  Perception WDL  Hallucination None reported or observed  Judgment Impaired  Confusion None  Danger to Self  Current suicidal ideation? Denies  Self-Injurious Behavior No self-injurious ideation or behavior indicators observed or expressed   Agreement Not to Harm Self Yes  Description of Agreement verbal  Danger to Others  Danger to Others None reported or observed

## 2023-04-25 NOTE — Group Note (Signed)
Date:  04/25/2023 Time:  9:53 AM  Group Topic/Focus:  Goals Group:   The focus of this group is to help patients establish daily goals to achieve during treatment and discuss how the patient can incorporate goal setting into their daily lives to aide in recovery.    Participation Level:  Active  Participation Quality:  Appropriate  Affect:  Appropriate  Cognitive:  Appropriate  Insight: Appropriate  Engagement in Group:  Engaged  Modes of Intervention:  Discussion  Additional Comments:     Reymundo Poll 04/25/2023, 9:53 AM

## 2023-04-25 NOTE — Progress Notes (Signed)
D:  Patient's self inventory sheet, patient has fair sleep, sleep medication not helpful.   Rated depression 2, denied hopeless and anxiety.  Denied withdrawals.  Denied SI. Denied physical problems.  Physical pain in hands, RA, worst pain #4 in past 24 hours.  Pain medicine helpful.  Goal is make infusion appointment tomorrow.  Plans to find out time of appointment.  Does have discharge plans. A:  Medications administered per MD orders.  Emotional support and encouragement given patient. R:  Denied SI and HI, contracts for safety.  Denied A/V hallucinations.  Safety maintained with 15 minute checks.

## 2023-04-25 NOTE — Progress Notes (Signed)
   04/25/23 2124  Psych Admission Type (Psych Patients Only)  Admission Status Voluntary  Psychosocial Assessment  Patient Complaints Depression  Eye Contact Fair  Facial Expression Flat  Affect Appropriate to circumstance  Speech Logical/coherent  Interaction Assertive  Motor Activity Other (Comment) (WNL)  Appearance/Hygiene Unremarkable  Behavior Characteristics Cooperative  Mood Depressed;Pleasant  Thought Process  Coherency WDL  Content WDL  Delusions None reported or observed  Perception WDL  Hallucination None reported or observed  Judgment Impaired  Confusion None  Danger to Self  Current suicidal ideation? Denies  Agreement Not to Harm Self Yes  Description of Agreement verbal  Danger to Others  Danger to Others None reported or observed

## 2023-04-25 NOTE — Group Note (Unsigned)
Date:  04/25/2023 Time:  10:50 AM  Group Topic/Focus:  Goals Group:   The focus of this group is to help patients establish daily goals to achieve during treatment and discuss how the patient can incorporate goal setting into their daily lives to aide in recovery.     Participation Level:  {B[facility] PARTICIPATION UXNAT:55732}  Participation Quality:  {B[facility] PARTICIPATION QUALITY:22265}  Affect:  {BHH AFFECT:22266}  Cognitive:  {B[facility] COGNITIVE:22267}  Insight: {BHH Insight2:20797}  Engagement in Group:  {BHH ENGAGEMENT IN KGURK:27062}  Modes of Intervention:  {B[facility] MODES OF INTERVENTION:22269}  Additional Comments:  ***  Reymundo Poll 04/25/2023, 10:50 AM

## 2023-04-25 NOTE — Group Note (Signed)
Occupational Therapy Group Note  Group Topic: Sleep Hygiene  Group Date: 04/25/2023 Start Time: 1430 End Time: 1500 Facilitators: Ted Mcalpine, OT   Group Description: Group encouraged increased participation and engagement through topic focused on sleep hygiene. Patients reflected on the quality of sleep they typically receive and identified areas that need improvement. Group was given background information on sleep and sleep hygiene, including common sleep disorders. Group members also received information on how to improve one's sleep and introduced a sleep diary as a tool that can be utilized to track sleep quality over a length of time. Group session ended with patients identifying one or more strategies they could utilize or implement into their sleep routine in order to improve overall sleep quality.        Therapeutic Goal(s):  Identify one or more strategies to improve overall sleep hygiene  Identify one or more areas of sleep that are negatively impacted (sleep too much, too little, etc)     Participation Level: Engaged   Participation Quality: Independent   Behavior: Appropriate   Speech/Thought Process: Relevant   Affect/Mood: Appropriate   Insight: Fair   Judgement: Fair      Modes of Intervention: Education  Patient Response to Interventions:  Attentive   Plan: Continue to engage patient in OT groups 2 - 3x/week.  04/25/2023  Ted Mcalpine, OT   Kerrin Champagne, OT
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
## 2023-04-25 NOTE — Group Note (Unsigned)
Date:  04/25/2023 Time:  4:28 PM  Group Topic/Focus:  Healthy Communication:   The focus of this group is to discuss communication, barriers to communication, as well as healthy ways to communicate with others.     Participation Level:  {BHH PARTICIPATION WGNFA:21308}  Participation Quality:  {BHH PARTICIPATION QUALITY:22265}  Affect:  {BHH AFFECT:22266}  Cognitive:  {BHH COGNITIVE:22267}  Insight: {BHH Insight2:20797}  Engagement in Group:  {BHH ENGAGEMENT IN MVHQI:69629}  Modes of Intervention:  {BHH MODES OF INTERVENTION:22269}  Additional Comments:  ***  Reymundo Poll 04/25/2023, 4:28 PM

## 2023-04-25 NOTE — Group Note (Signed)
Recreation Therapy Group Note   Group Topic:Problem Solving  Group Date: 04/25/2023 Start Time: 0935 End Time: 1000 Facilitators: Cayman Brogden-McCall, LRT,CTRS Location: [address] Dayroom   Goal Area(s) Addresses:  Patient will effectively work with peer towards shared goal.  Patient will identify skills used to make activity successful.  Patient will identify how skills used during activity can be used to reach post d/c goals.   Intervention: STEM Activity  Group Description: Stage manager. In teams of 3-5, patients were given 12 plastic drinking straws and an equal length of masking tape. Using the materials provided, patients were asked to build a landing pad to catch a golf ball dropped from approximately 5 feet in the air. All materials were required to be used by the team in their design. LRT facilitated post-activity discussion.  Education: Pharmacist, community, Scientist, physiological, Discharge Planning   Education Outcome: Acknowledges education/In group clarification offered/Needs additional education.    Affect/Mood: Appropriate   Participation Level: Engaged   Participation Quality: Independent   Behavior: Appropriate   Speech/Thought Process: Focused   Insight: Good   Judgement: Good   Modes of Intervention: STEM Activity   Patient Response to Interventions:  Engaged   Education Outcome:  In group clarification offered    Clinical Observations/Individualized Feedback: Pt attended and participated in the activity. Pt worked well with peers in completing the project.     Plan: Continue to engage patient in RT group sessions 2-3x/week.   Elfreda Blanchet-McCall, LRT,CTRS 04/25/2023 11:39 AM

## 2023-04-25 NOTE — Plan of Care (Signed)
  Problem: Activity: Goal: Interest or engagement in activities will improve Outcome: Progressing   Problem: Safety: Goal: Periods of time without injury will increase Outcome: Progressing   

## 2023-04-25 NOTE — Plan of Care (Signed)
Nurse discussed anxiety, depression and coping skills with patient.  

## 2023-04-25 NOTE — B[facility] Group Notes (Signed)
B[facility] Group Notes:  (Nursing/MHT/Case Management/Adjunct)  Date:  04/25/2023  Time:  9:24 PM  Type of Therapy:  Group Therapy  Participation Level:  Did Not Attend  Participation Quality:  Resistant  Affect:  Resistant  Cognitive:  Lacking  Insight:  None  Engagement in Group:  None  Modes of Intervention:  Education  Summary of Progress/Problems: The patient did not attend the evening group.   Hazle Coca S 04/25/2023, 9:24 PM
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
## 2023-04-25 NOTE — [facility] Suicide Risk Assessment (Signed)
Suicide Risk Assessment  Discharge Assessment    [facility] Discharge Suicide Risk Assessment   Principal Problem: MDD (major depressive disorder), recurrent episode, moderate (HCC) Discharge Diagnoses: Principal Problem:   MDD (major depressive disorder), recurrent episode, moderate (HCC) Active Problems:   GAD (generalized anxiety disorder)  Brief HPI:   Shannon David is a 32 yr old female who was admitted to [facility] on 10/25 after an episode of Self Harm (scratching arms with scissors) due to worsening family and work stress.  PPHx is significant for MDD, and no history of Suicide Attempts, Prior episodes of Self Harm, or Prior Suicide Attempts.  During the patient's hospitalization, patient had extensive initial psychiatric evaluation, and follow-up psychiatric evaluations every day.  Psychiatric diagnoses provided upon initial assessment: As listed above.  Patient's psychiatric medications were adjusted on admission: As follows: -Continue Paxil 60 mg daily for mood and anxiety  (home medication). During the hospitalization, other adjustments were made to the patient's psychiatric medication regimen: Medications at discharge are as follows:  MDD, Recurrent, Severe, w/out Psychosis  GAD: -Continue Paxil 60 mg daily for depression and anxiety -Continue Propanolol 10 mg BID for anxiety and HTN -Continue Trazodone 100 mg nightly as needed for sleep    RA: -Continue Methotrexate 20 mg weekly -Continue Hydroxychloroquine 200 mg BID on week days -Continue Folic Acid 2 mg daily    Asthma: -Continue Albuterol 1-2 puffs q6 PRN  -Continue Gabapentin 300 mg BID -Continue Synthroid 112 mcg daily   Patient's care was discussed during the interdisciplinary team meeting every day during the hospitalization. The patient denies having side effects to prescribed psychiatric medications. Gradually, patient started adjusting to milieu. The patient was evaluated each day by a clinical provider to ascertain  response to treatment. Improvement was noted by the patient's report of decreasing symptoms, improved sleep and appetite, affect, medication tolerance, behavior, and participation in unit programming.  Patient was asked each day to complete a self inventory noting mood, mental status, pain, new symptoms, anxiety and concerns. Symptoms were reported as significantly decreased or resolved completely by discharge.   On day of discharge, the patient reports that their mood is stable. The patient denied having suicidal thoughts for more than 48 hours prior to discharge.  Patient denies having homicidal thoughts.  Patient denies having auditory hallucinations.  Patient denies any visual hallucinations or other symptoms of psychosis. The patient was motivated to continue taking medication with a goal of continued improvement in mental health.   The patient reports their target psychiatric symptoms of depression, anxiety, insomnia responded well to the psychiatric medications, and the patient reports overall benefit from this psychiatric hospitalization. Supportive psychotherapy was provided to the patient. The patient also participated in regular group therapy while hospitalized. Coping skills, problem solving as well as relaxation therapies were also part of the unit programming.  Labs were reviewed with the patient, and abnormal results were discussed with the patient.  The patient is able to verbalize their individual safety plan to this provider.  # It is recommended to the patient to continue psychiatric medications as prescribed, after discharge from the hospital.    # It is recommended to the patient to follow up with your outpatient psychiatric provider and PCP.  # It was discussed with the patient, the impact of alcohol, drugs, tobacco have been there overall psychiatric and medical wellbeing, and total abstinence from substance use was recommended the patient.ed.  # Prescriptions sent directly to  preferred pharmacy at discharge. Patient agreeable to  plan. Given opportunity to ask questions. Appears to feel comfortable with discharge.    # In the event of worsening symptoms, the patient is instructed to call the crisis hotline (988), 911 and or go to the nearest ED for appropriate evaluation and treatment of symptoms. To follow-up with primary care provider for other medical issues, concerns and or health care needs  # Patient was discharged home with a plan to follow up as noted below.   Total Time spent with patient: 45 minutes  Musculoskeletal: Strength & Muscle Tone: within normal limits Gait & Station: normal Patient leans: N/A  Psychiatric Specialty Exam  Presentation  General Appearance:  Appropriate for Environment; Well Groomed  Eye Contact: Good  Speech: Clear and Coherent  Speech Volume: Normal  Handedness: Right   Mood and Affect  Mood: Euthymic  Duration of Depression Symptoms: Greater than two weeks  Affect: Congruent   Thought Process  Thought Processes: Coherent  Descriptions of Associations:Intact  Orientation:Full (Time, Place and Person)  Thought Content:Logical  History of Schizophrenia/Schizoaffective disorder:No  Duration of Psychotic Symptoms:No data recorded Hallucinations:Hallucinations: None  Ideas of Reference:None  Suicidal Thoughts:Suicidal Thoughts: No  Homicidal Thoughts:Homicidal Thoughts: No   Sensorium  Memory: Immediate Good  Judgment: Good  Insight: Good   Executive Functions  Concentration: Good  Attention Span: Good  Recall: Good  Fund of Knowledge: Good  Language: Good   Psychomotor Activity  Psychomotor Activity: Psychomotor Activity: Normal   Assets  Assets: Communication Skills; Social Support; Resilience   Sleep  Sleep: Sleep: Good Number of Hours of Sleep: 7   Physical Exam: Physical Exam Constitutional:      Appearance: Normal appearance.  HENT:      Head: Normocephalic.  Pulmonary:     Effort: Pulmonary effort is normal.  Musculoskeletal:        General: Normal range of motion.     Cervical back: Normal range of motion.  Neurological:     General: No focal deficit present.     Mental Status: She is alert.    Review of Systems  Constitutional: Negative.   HENT: Negative.    Eyes: Negative.   Respiratory: Negative.    Cardiovascular: Negative.   Gastrointestinal: Negative.   Genitourinary: Negative.   Musculoskeletal: Negative.   Skin: Negative.   Neurological: Negative.   Psychiatric/Behavioral:  Positive for depression (Denies SI/HI, denies plan or intent to harm self or any one else). Negative for hallucinations, memory loss, substance abuse and suicidal ideas. The patient is nervous/anxious (Significantly decreased) and has insomnia (Significantly decreased).   All other systems reviewed and are negative.  Blood pressure 131/71, pulse 86, temperature 98 F (36.7 C), temperature source Oral, resp. rate 16, height 5\' 2"  (1.575 m), weight 122.5 kg, last menstrual period 04/01/2023, SpO2 99%. Body mass index is 49.38 kg/m.  Mental Status Per Nursing Assessment::   On Admission:  NA  Demographic Factors:  Low socioeconomic status  Loss Factors: Loss of significant relationship  Historical Factors: Impulsivity  Risk Reduction Factors:   Sense of responsibility to family and Positive social support  Continued Clinical Symptoms:  Chronic PainPain related to RA, but pt is stable as she is receiving treatments and verbalizes readiness for discharge.  Cognitive Features That Contribute To Risk:  None    Suicide Risk:  Mild:  There are no identifiable suicide plans, no associated intent, mild dysphoria and related symptoms, good self-control (both objective and subjective assessment), few other risk factors, and identifiable protective factors, including  available and accessible social support.    Follow-up Information      [facility] Follow up on 05/02/2023.   Specialty: Behavioral Health Why: You have an appointment for therapy services on 05/02/23 at 8:00 am.  You also have an appointment for medication management services on 05/04/23 at 2:30 pm. Contact information: [address] [place] [address] [phone]        BEHAVIORAL HEALTH PARTIAL HOSPITALIZATION PROGRAM Follow up on 04/28/2023.   Specialty: Behavioral Health Why: You are scheduled for an assessment for the Partial Hospitalization Program on Thursday, 04/28/23 @ 10a. PHP is a virtual group therapy that meets Mon-Fri from 9am-1pm for approximately 2-3 weeks. This assessment appointment will last approximately 1-1.5 hours and will be virtual via [employer]. Please download the [employer] Teams app prior to the appointment if you will use a tablet or phone for the appointment. If you need to cancel or reschedule, please call [phone] Contact information: [address] [place] [address] [phone]                Starleen Blue, NP 04/25/2023, 10:44 PM

## 2023-04-25 NOTE — Group Note (Signed)
Date:  04/25/2023 Time:  8:53 PM  Group Topic/Focus:  Wrap-Up Group:   The focus of this group is to help patients review their daily goal of treatment and discuss progress on daily workbooks.    Participation Level:  Active  Participation Quality:  Appropriate and Sharing  Affect:  Appropriate  Cognitive:  Appropriate  Insight: Appropriate  Engagement in Group:  Engaged  Modes of Intervention:  Activity and Socialization  Additional Comments:  The patient stated that she is feeling "anxious" about being discharge on tomorrow. The patient stated that "stress is one of here triggers and that she known that she'll have to go back to reality". The patient stated that "having the support here, makes her feel good". The patient rated her day a 6/10. The patient participated in the group activity at the end of the group.   Kennieth Francois 04/25/2023, 8:53 PM

## 2023-04-26 DIAGNOSIS — F331 Major depressive disorder, recurrent, moderate: Secondary | ICD-10-CM | POA: Diagnosis not present

## 2023-04-26 NOTE — Progress Notes (Signed)
   04/26/23 0557  15 Minute Checks  Location Bedroom  Visual Appearance Calm  Behavior Sleeping  Sleep (Behavioral Health Patients Only)  Calculate sleep? (Click Yes once per 24 hr at 0600 safety check) Yes  Documented sleep last 24 hours 7.75

## 2023-04-26 NOTE — Progress Notes (Signed)
  [facility] Adult Case Management Discharge Plan :  Will you be returning to the same living situation after discharge:  Yes,  Pt will be returning back with her dad. At discharge, do you have transportation home?: Yes,  Pt will be picked up at 9:00AM  Do you have the ability to pay for your medications: Yes,  Pt is employed and has [employer]  Release of information consent forms completed and in the chart;  Patient's signature needed at discharge.  Patient to Follow up at:  Follow-up Information     [facility] Follow up on 05/02/2023.   Specialty: Behavioral Health Why: You have an appointment for therapy services on 05/02/23 at 8:00 am.  You also have an appointment for medication management services on 05/04/23 at 2:30 pm. Contact information: [address] [place] [address] [phone]        BEHAVIORAL HEALTH PARTIAL HOSPITALIZATION PROGRAM Follow up on 04/28/2023.   Specialty: Behavioral Health Why: You are scheduled for an assessment for the Partial Hospitalization Program on Thursday, 04/28/23 @ 10a. PHP is a virtual group therapy that meets Mon-Fri from 9am-1pm for approximately 2-3 weeks. This assessment appointment will last approximately 1-1.5 hours and will be virtual via [employer]. Please download the [employer] Teams app prior to the appointment if you will use a tablet or phone for the appointment. If you need to cancel or reschedule, please call [phone] Contact information: [address] [place] [address] [phone]                Next level of care provider has access to [facility] Link:yes  Safety Planning and Suicide Prevention discussed: Yes,  Macarthur Critchley (dad) [phone]     Has patient been referred to the Quitline?: Patient does not use tobacco/nicotine products  Patient has been referred for addiction treatment: No known substance use disorder.  Kathi Der, LCSWA 04/26/2023, 8:01 AM

## 2023-04-26 NOTE — Group Note (Signed)
Date:  04/26/2023 Time:  9:23 AM  Group Topic/Focus:  Goals Group:   The focus of this group is to help patients establish daily goals to achieve during treatment and discuss how the patient can incorporate goal setting into their daily lives to aide in recovery.    Participation Level:  Active  Participation Quality:  Appropriate  Affect:  Appropriate  Cognitive:  Appropriate  Insight: Appropriate  Engagement in Group:  Engaged  Modes of Intervention:  Discussion  Additional Comments:    Althea Backs D Jericka Kadar 04/26/2023, 9:23 AM

## 2023-04-26 NOTE — Progress Notes (Signed)
Patient verbalizes readiness for discharge. All patient belongings returned to patient. Discharge instructions read and discussed with patient (appointments, medications, resources). Patient expressed gratitude for care provided. Patient discharged to lobby at [phone] where her father was waiting.

## 2023-04-26 NOTE — Discharge Summary (Signed)
Physician Discharge Summary Note  Patient:  Shannon David is an 32 y.o., female MRN:  [id number] DOB:  Jun 16, 1991 Patient phone:  [phone] (home)  Patient address:   [address] [place] [facility] [address],  Total Time spent with patient: 45 minutes  Date of Admission:  04/21/2023 Date of Discharge: 04/26/2023  Reason for Admission:   Brief HPI:   Shannon David is a 32 yr old female who was admitted to [facility] on 10/25 after an episode of Self Harm (scratching arms with scissors) due to worsening family and work stress.  PPHx is significant for MDD, and no history of Suicide Attempts, Prior episodes of Self Harm, or Prior Suicide Attempts.  Principal Problem: MDD (major depressive disorder), recurrent episode, moderate (HCC) Discharge Diagnoses: Principal Problem:   MDD (major depressive disorder), recurrent episode, moderate (HCC) Active Problems:   GAD (generalized anxiety disorder)  Past Psychiatric History: See H & P  Past Medical History:  Past Medical History:  Diagnosis Date   COVID    History of Clostridium difficile infection 01/2023   History of hypothyroidism    History of PCOS    Major depressive disorder, recurrent severe without psychotic features (HCC)    Passive suicidal ideations 11/11/2022   Rheumatoid arthritis (HCC)    Suicidal behavior with attempted self-injury (HCC) 04/22/2023    Past Surgical History:  Procedure Laterality Date   BUNIONECTOMY Right    FOOT SURGERY Right 08/24/2021   Family History:  Family History  Problem Relation Age of Onset   Hypertension Mother    Clotting disorder Mother    Rheum arthritis Brother    Lung cancer Paternal Uncle    Cancer Maternal Grandmother        lung   Family Psychiatric  History: See H & P Social History:  Social History   Substance and Sexual Activity  Alcohol Use No     Social History   Substance and Sexual Activity  Drug Use No    Social History   Socioeconomic History    Marital status: Married    Spouse name: Not on file   Number of children: 0   Years of education: Not on file   Highest education level: Not on file  Occupational History   Not on file  Tobacco Use   Smoking status: Never    Passive exposure: Never   Smokeless tobacco: Never  Vaping Use   Vaping status: Never Used  Substance and Sexual Activity   Alcohol use: No   Drug use: No   Sexual activity: Not on file  Other Topics Concern   Not on file  Social History Narrative   Left Handed    Lives in a one story home   Drinks Caffeine    Social Determinants of Health   Financial Resource Strain: Not on file  Food Insecurity: Patient Declined (04/21/2023)   Hunger Vital Sign    Worried About Running Out of Food in the Last Year: Patient declined    Ran Out of Food in the Last Year: Patient declined  Transportation Needs: No Transportation Needs (04/21/2023)   PRAPARE - Administrator, Civil Service (Medical): No    Lack of Transportation (Non-Medical): No  Physical Activity: Not on file  Stress: Not on file  Social Connections: Not on file   Hospital Course:   During the patient's hospitalization, patient had extensive initial psychiatric evaluation, and follow-up psychiatric evaluations every day.  Psychiatric diagnoses provided upon initial assessment: As  listed above.   Patient's psychiatric medications were adjusted on admission: As follows: -Continue Paxil 60 mg daily for mood and anxiety  (home medication). During the hospitalization, other adjustments were made to the patient's psychiatric medication regimen: Medications at discharge are as follows:   MDD, Recurrent, Severe, w/out Psychosis  GAD: -Continue Paxil 60 mg daily for depression and anxiety -Continue Propanolol 10 mg BID for anxiety and HTN -Continue Trazodone 100 mg nightly as needed for sleep    RA: -Continue Methotrexate 20 mg weekly -Continue Hydroxychloroquine 200 mg BID on week  days -Continue Folic Acid 2 mg daily    Asthma: -Continue Albuterol 1-2 puffs q6 PRN  -Continue Gabapentin 300 mg BID -Continue Synthroid 112 mcg daily    Patient's care was discussed during the interdisciplinary team meeting every day during the hospitalization. The patient denies having side effects to prescribed psychiatric medications. Gradually, patient started adjusting to milieu. The patient was evaluated each day by a clinical provider to ascertain response to treatment. Improvement was noted by the patient's report of decreasing symptoms, improved sleep and appetite, affect, medication tolerance, behavior, and participation in unit programming.  Patient was asked each day to complete a self inventory noting mood, mental status, pain, new symptoms, anxiety and concerns. Symptoms were reported as significantly decreased or resolved completely by discharge.    On day of discharge, the patient reports that their mood is stable. The patient denied having suicidal thoughts for more than 48 hours prior to discharge.  Patient denies having homicidal thoughts.  Patient denies having auditory hallucinations.  Patient denies any visual hallucinations or other symptoms of psychosis. The patient was motivated to continue taking medication with a goal of continued improvement in mental health.    The patient reports their target psychiatric symptoms of depression, anxiety, insomnia responded well to the psychiatric medications, and the patient reports overall benefit from this psychiatric hospitalization. Supportive psychotherapy was provided to the patient. The patient also participated in regular group therapy while hospitalized. Coping skills, problem solving as well as relaxation therapies were also part of the unit programming.   Labs were reviewed with the patient, and abnormal results were discussed with the patient.   The patient is able to verbalize their individual safety plan to this  provider.   # It is recommended to the patient to continue psychiatric medications as prescribed, after discharge from the hospital.     # It is recommended to the patient to follow up with your outpatient psychiatric provider and PCP.   # It was discussed with the patient, the impact of alcohol, drugs, tobacco have been there overall psychiatric and medical wellbeing, and total abstinence from substance use was recommended the patient.ed.   # Prescriptions sent directly to preferred pharmacy at discharge. Patient agreeable to plan. Given opportunity to ask questions. Appears to feel comfortable with discharge.    # In the event of worsening symptoms, the patient is instructed to call the crisis hotline (988), 911 and or go to the nearest ED for appropriate evaluation and treatment of symptoms. To follow-up with primary care provider for other medical issues, concerns and or health care needs   # Patient was discharged home with a plan to follow up as noted below.    Total Time spent with patient: 45 minutes  Physical Findings: AIMS: 0 CIWA: n/a COWS:n/a  Musculoskeletal: Strength & Muscle Tone: within normal limits Gait & Station: normal Patient leans: N/A  Psychiatric Specialty Exam:  Presentation  General Appearance:  Appropriate for Environment; Well Groomed  Eye Contact: Good  Speech: Clear and Coherent  Speech Volume: Normal  Handedness: Right   Mood and Affect  Mood: Euthymic  Affect: Congruent   Thought Process  Thought Processes: Coherent  Descriptions of Associations:Intact  Orientation:Full (Time, Place and Person)  Thought Content:Logical  History of Schizophrenia/Schizoaffective disorder:No  Duration of Psychotic Symptoms:No data recorded Hallucinations:Hallucinations: None  Ideas of Reference:None  Suicidal Thoughts:Suicidal Thoughts: No  Homicidal Thoughts:Homicidal Thoughts: No   Sensorium  Memory: Immediate  Good  Judgment: Good  Insight: Good   Executive Functions  Concentration: Good  Attention Span: Good  Recall: Good  Fund of Knowledge: Good  Language: Good   Psychomotor Activity  Psychomotor Activity: Psychomotor Activity: Normal   Assets  Assets: Communication Skills; Social Support; Resilience   Sleep  Sleep: Sleep: Good    Physical Exam: Physical Exam Review of Systems  Constitutional: Negative.   HENT: Negative.    Eyes: Negative.   Respiratory: Negative.    Cardiovascular: Negative.   Gastrointestinal: Negative.   Genitourinary: Negative.   Musculoskeletal: Negative.   Skin: Negative.   Neurological: Negative.   Psychiatric/Behavioral:  Positive for depression (Denies SI/HI'/AVH. Denies intent or plan to harm self or others in the community). Negative for hallucinations, memory loss, substance abuse and suicidal ideas. The patient is nervous/anxious (Resolving) and has insomnia (Resolving).   All other systems reviewed and are negative.  Blood pressure 110/75, pulse 90, temperature 97.9 F (36.6 C), temperature source Oral, resp. rate 16, height 5\' 2"  (1.575 m), weight 122.5 kg, last menstrual period 04/01/2023, SpO2 100%. Body mass index is 49.38 kg/m.   Social History   Tobacco Use  Smoking Status Never   Passive exposure: Never  Smokeless Tobacco Never   Tobacco Cessation:  A prescription for an FDA-approved tobacco cessation medication provided at discharge   Blood Alcohol level:  Lab Results  Component Value Date   ETH <10 11/11/2022    Metabolic Disorder Labs:  No results found for: "HGBA1C", "MPG" No results found for: "PROLACTIN" Lab Results  Component Value Date   CHOL 151 12/28/2022   TRIG 65.0 12/28/2022   HDL 48.50 12/28/2022   CHOLHDL 3 12/28/2022   VLDL 13.0 12/28/2022   LDLCALC 89 12/28/2022   LDLCALC 74 10/18/2022    See Psychiatric Specialty Exam and Suicide Risk Assessment completed by Attending  Physician prior to discharge.  Discharge destination:  Home  Is patient on multiple antipsychotic therapies at discharge:  No   Has Patient had three or more failed trials of antipsychotic monotherapy by history:  No  Recommended Plan for Multiple Antipsychotic Therapies: NA   Allergies as of 04/26/2023       Reactions   Keflex [cephalexin] Hives        Medication List     STOP taking these medications    acetaminophen 500 MG tablet Commonly known as: TYLENOL   ACTEMRA IV   IBU-200 200 MG tablet Generic drug: ibuprofen   naloxone 4 MG/0.1ML Liqd nasal spray kit Commonly known as: NARCAN   ondansetron 8 MG disintegrating tablet Commonly known as: ZOFRAN-ODT   traMADol 50 MG tablet Commonly known as: ULTRAM       TAKE these medications      Indication  albuterol 108 (90 Base) MCG/ACT inhaler Commonly known as: VENTOLIN HFA Inhale 1-2 puffs into the lungs every 6 (six) hours as needed for wheezing or shortness of breath.  Indication: Asthma  folic acid 1 MG tablet Commonly known as: FOLVITE Take 2 tablets (2 mg total) by mouth daily.  Indication: nutritional supplementation   gabapentin 300 MG capsule Commonly known as: NEURONTIN Take 1 capsule (300 mg total) by mouth 2 (two) times daily.  Indication: Generalized Anxiety Disorder   hydroxychloroquine 200 MG tablet Commonly known as: PLAQUENIL TAKE 1 TABLET BY MOUTH TWICE A DAY MONDAY-FRIDAY  Indication: Rheumatoid Arthritis   levothyroxine 112 MCG tablet Commonly known as: SYNTHROID Take 1 tablet (112 mcg total) by mouth daily.  Indication: Underactive Thyroid   methotrexate 2.5 MG tablet Commonly known as: RHEUMATREX TAKE 8 TABLETS (20 MG TOTAL) BY MOUTH ONCE A WEEK. CAUTION:CHEMOTHERAPY. PROTECT FROM LIGHT. What changed:  how much to take how to take this when to take this additional instructions  Indication: Rheumatoid Arthritis   PARoxetine 30 MG tablet Commonly known as:  PAXIL Take 2 tablets (60 mg total) by mouth daily. What changed:  medication strength how much to take additional instructions Another medication with the same name was removed. Continue taking this medication, and follow the directions you see here.  Indication: Major Depressive Disorder   propranolol 10 MG tablet Commonly known as: INDERAL Take 1 tablet (10 mg total) by mouth 2 (two) times daily.  Indication: Feeling Anxious   traZODone 100 MG tablet Commonly known as: DESYREL Take 1 tablet (100 mg total) by mouth at bedtime as needed for sleep. What changed:  medication strength how much to take  Indication: Trouble Sleeping   Vitamin D3 50 MCG (2000 UT) capsule Take 2,000 Units by mouth daily.  Indication: Osteoporosis        Follow-up Information     [facility] Follow up on 05/02/2023.   Specialty: Behavioral Health Why: You have an appointment for therapy services on 05/02/23 at 8:00 am.  You also have an appointment for medication management services on 05/04/23 at 2:30 pm. Contact information: [address] [place] [address] [phone]        BEHAVIORAL HEALTH PARTIAL HOSPITALIZATION PROGRAM Follow up on 04/28/2023.   Specialty: Behavioral Health Why: You are scheduled for an assessment for the Partial Hospitalization Program on Thursday, 04/28/23 @ 10a. PHP is a virtual group therapy that meets Mon-Fri from 9am-1pm for approximately 2-3 weeks. This assessment appointment will last approximately 1-1.5 hours and will be virtual via [employer] Please download the [employer] Teams app prior to the appointment if you will use a tablet or phone for the appointment. If you need to cancel or reschedule, please call [phone] Contact information: [address] [place] [address] [phone]               Signed: Starleen Blue, NP 04/26/2023, 2:46  PM

## 2023-04-27 ENCOUNTER — Ambulatory Visit (INDEPENDENT_AMBULATORY_CARE_PROVIDER_SITE_OTHER): Payer: BC Managed Care – PPO

## 2023-04-27 VITALS — BP 106/73 | HR 76 | Temp 98.0°F | Resp 16 | Ht 62.0 in | Wt 276.4 lb

## 2023-04-27 DIAGNOSIS — Z79899 Other long term (current) drug therapy: Secondary | ICD-10-CM | POA: Diagnosis not present

## 2023-04-27 DIAGNOSIS — M0579 Rheumatoid arthritis with rheumatoid factor of multiple sites without organ or systems involvement: Secondary | ICD-10-CM | POA: Diagnosis not present

## 2023-04-27 MED ORDER — DIPHENHYDRAMINE HCL 25 MG PO CAPS
25.0000 mg | ORAL_CAPSULE | Freq: Once | ORAL | Status: AC
  Administered 2023-04-27: 25 mg via ORAL
  Filled 2023-04-27: qty 1

## 2023-04-27 MED ORDER — TOCILIZUMAB 400 MG/20ML IV SOLN
480.0000 mg | Freq: Once | INTRAVENOUS | Status: AC
  Administered 2023-04-27: 480 mg via INTRAVENOUS
  Filled 2023-04-27: qty 24

## 2023-04-27 MED ORDER — TOCILIZUMAB 400 MG/20ML IV SOLN
4.0000 mg/kg | Freq: Once | INTRAVENOUS | Status: DC
  Filled 2023-04-27: qty 24.3

## 2023-04-27 MED ORDER — ACETAMINOPHEN 325 MG PO TABS
650.0000 mg | ORAL_TABLET | Freq: Once | ORAL | Status: AC
  Administered 2023-04-27: 650 mg via ORAL
  Filled 2023-04-27: qty 2

## 2023-04-27 NOTE — Progress Notes (Signed)
Diagnosis: Rheumatoid Arthritis  Provider:  Chilton Greathouse MD  Procedure: IV Infusion  IV Type: Peripheral, IV Location: L Antecubital  Actemra (Tocilizumab), Dose: 480 mg  Infusion Start Time: 0941  Infusion Stop Time: 1049  Post Infusion IV Care: Patient declined observation and Peripheral IV Discontinued  Discharge: Condition: Stable, Destination: Home . AVS Declined  Performed by:  Wyvonne Lenz, RN

## 2023-04-28 ENCOUNTER — Encounter (HOSPITAL_COMMUNITY): Payer: Self-pay

## 2023-04-28 ENCOUNTER — Other Ambulatory Visit (HOSPITAL_COMMUNITY): Payer: BC Managed Care – PPO | Attending: Psychiatry | Admitting: Licensed Clinical Social Worker

## 2023-04-28 ENCOUNTER — Telehealth (HOSPITAL_COMMUNITY): Payer: Self-pay | Admitting: Licensed Clinical Social Worker

## 2023-04-28 DIAGNOSIS — F331 Major depressive disorder, recurrent, moderate: Secondary | ICD-10-CM

## 2023-04-28 DIAGNOSIS — F329 Major depressive disorder, single episode, unspecified: Secondary | ICD-10-CM | POA: Insufficient documentation

## 2023-04-28 DIAGNOSIS — F411 Generalized anxiety disorder: Secondary | ICD-10-CM
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
## 2023-04-29 ENCOUNTER — Ambulatory Visit (HOSPITAL_COMMUNITY): Payer: BC Managed Care – PPO

## 2023-05-02 ENCOUNTER — Telehealth (HOSPITAL_COMMUNITY): Payer: Self-pay | Admitting: Licensed Clinical Social Worker

## 2023-05-02 ENCOUNTER — Ambulatory Visit (INDEPENDENT_AMBULATORY_CARE_PROVIDER_SITE_OTHER): Payer: BC Managed Care – PPO | Admitting: Clinical

## 2023-05-02 ENCOUNTER — Encounter (HOSPITAL_COMMUNITY): Payer: Self-pay | Admitting: Clinical

## 2023-05-02 DIAGNOSIS — F411 Generalized anxiety disorder: Secondary | ICD-10-CM

## 2023-05-02 DIAGNOSIS — F332 Major depressive disorder, recurrent severe without psychotic features: Secondary | ICD-10-CM | POA: Diagnosis not present
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
## 2023-05-03 ENCOUNTER — Other Ambulatory Visit (HOSPITAL_COMMUNITY): Payer: BC Managed Care – PPO | Attending: Psychiatry

## 2023-05-03 ENCOUNTER — Other Ambulatory Visit: Payer: Self-pay | Admitting: Physician Assistant

## 2023-05-03 ENCOUNTER — Other Ambulatory Visit (HOSPITAL_COMMUNITY): Payer: BC Managed Care – PPO | Attending: Psychiatry | Admitting: Licensed Clinical Social Worker

## 2023-05-03 ENCOUNTER — Other Ambulatory Visit: Payer: Self-pay

## 2023-05-03 DIAGNOSIS — F411 Generalized anxiety disorder: Secondary | ICD-10-CM | POA: Diagnosis present

## 2023-05-03 DIAGNOSIS — M0579 Rheumatoid arthritis with rheumatoid factor of multiple sites without organ or systems involvement: Secondary | ICD-10-CM

## 2023-05-03 DIAGNOSIS — F332 Major depressive disorder, recurrent severe without psychotic features: Secondary | ICD-10-CM

## 2023-05-03 DIAGNOSIS — F0631 Mood disorder due to known physiological condition with depressive features: Secondary | ICD-10-CM

## 2023-05-03 DIAGNOSIS — R4589 Other symptoms and signs involving emotional state: Secondary | ICD-10-CM

## 2023-05-03 DIAGNOSIS — F331 Major depressive disorder, recurrent, moderate: Secondary | ICD-10-CM

## 2023-05-03 NOTE — Telephone Encounter (Signed)
Last Fill: 11/02/2022  Labs: 04/09/2023 WBC 3.7, Sodium 133, Calcium 8.0  TB Gold: 10/18/2022 Neg    Next Visit: 06/24/2023  Last Visit: 03/22/2023  DX: Rheumatoid arthritis involving multiple sites with positive rheumatoid factor   Current Dose per office note 03/22/2023: IV Actemra monthly injections   Okay to refill Actemra?

## 2023-05-03 NOTE — Telephone Encounter (Signed)
Is the refill going to [facility]? The patient should be going to the infusion center monthly

## 2023-05-03 NOTE — Telephone Encounter (Signed)
[facility] sends the meds to the infusion center for administration.

## 2023-05-04 ENCOUNTER — Other Ambulatory Visit (HOSPITAL_COMMUNITY): Payer: BC Managed Care – PPO | Attending: Psychiatry | Admitting: Licensed Clinical Social Worker

## 2023-05-04 ENCOUNTER — Other Ambulatory Visit (HOSPITAL_COMMUNITY): Payer: BC Managed Care – PPO

## 2023-05-04 ENCOUNTER — Ambulatory Visit (HOSPITAL_COMMUNITY): Payer: BC Managed Care – PPO | Admitting: Student

## 2023-05-04 DIAGNOSIS — M0579 Rheumatoid arthritis with rheumatoid factor of multiple sites without organ or systems involvement: Secondary | ICD-10-CM

## 2023-05-04 DIAGNOSIS — R45851 Suicidal ideations: Secondary | ICD-10-CM | POA: Diagnosis not present

## 2023-05-04 DIAGNOSIS — G8929 Other chronic pain: Secondary | ICD-10-CM | POA: Diagnosis not present

## 2023-05-04 DIAGNOSIS — R4589 Other symptoms and signs involving emotional state: Secondary | ICD-10-CM

## 2023-05-04 DIAGNOSIS — F411 Generalized anxiety disorder: Secondary | ICD-10-CM | POA: Diagnosis present

## 2023-05-04 DIAGNOSIS — F332 Major depressive disorder, recurrent severe without psychotic features: Secondary | ICD-10-CM | POA: Diagnosis not present
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
## 2023-05-05 ENCOUNTER — Other Ambulatory Visit (HOSPITAL_COMMUNITY): Payer: BC Managed Care – PPO

## 2023-05-06 ENCOUNTER — Other Ambulatory Visit (HOSPITAL_COMMUNITY): Payer: BC Managed Care – PPO

## 2023-05-06 ENCOUNTER — Encounter (HOSPITAL_COMMUNITY): Payer: Self-pay

## 2023-05-06 ENCOUNTER — Other Ambulatory Visit (HOSPITAL_COMMUNITY): Payer: BC Managed Care – PPO | Attending: Psychiatry | Admitting: Licensed Clinical Social Worker

## 2023-05-06 DIAGNOSIS — F332 Major depressive disorder, recurrent severe without psychotic features: Secondary | ICD-10-CM

## 2023-05-06 DIAGNOSIS — R45851 Suicidal ideations: Secondary | ICD-10-CM | POA: Insufficient documentation

## 2023-05-06 DIAGNOSIS — R4589 Other symptoms and signs involving emotional state: Secondary | ICD-10-CM

## 2023-05-06 DIAGNOSIS — F411 Generalized anxiety disorder: Secondary | ICD-10-CM

## 2023-05-06 HISTORY — DX: Major depressive disorder, recurrent severe without psychotic features: F33.2

## 2023-05-06 NOTE — Therapy (Signed)
[facility] PARTIAL HOSPITALIZATION PROGRAM [address] [place], [place], [address] Phone: [phone]   Fax:  [phone]  Occupational Therapy Evaluation Virtual Visit via Video Note  I connected with Shannon David on 05/06/23 at  8:00 AM EST by a video enabled telemedicine application and verified that I am speaking with the correct person using two identifiers.  Location: Patient: home Provider: office   I discussed the limitations of evaluation and management by telemedicine and the availability of in person appointments. The patient expressed understanding and agreed to proceed.    The patient was advised to call back or seek an in-person evaluation if the symptoms worsen or if the condition fails to improve as anticipated.  I provided 85 minutes of non-face-to-face time during this encounter.   Patient Details  Name: Shannon David MRN: [id number] Date of Birth: 12-13-1990 No data recorded  Encounter Date: 05/03/2023   OT End of Session - 05/06/23 0941     Visit Number 1    Number of Visits 20    Date for OT Re-Evaluation 06/05/23    OT Start Time 0930    OT Stop Time 1255   eval: 30; Tx: 55   OT Time Calculation (min) 205 min    Activity Tolerance Patient tolerated treatment well             Past Medical History:  Diagnosis Date   COVID    History of Clostridium difficile infection 01/2023   History of hypothyroidism    History of PCOS    Major depressive disorder, recurrent severe without psychotic features (HCC)    Passive suicidal ideations 11/11/2022   Rheumatoid arthritis (HCC)    Suicidal behavior with attempted self-injury (HCC) 04/22/2023    Past Surgical History:  Procedure Laterality Date   BUNIONECTOMY Right    FOOT SURGERY Right 08/24/2021    There were no vitals filed for this visit.   Subjective Assessment - 05/06/23 0940     Subjective  I hope to learn new coping skills and boundries while I am  here.    Pertinent History MDD, GAD    Currently in Pain? No/denies    Pain Score 0-No pain    Multiple Pain Sites No                 Group Session:   O:  Patient participated in a virtual occupational therapy session focusing on five alternative goal-setting methods: CLEAR Goals, OKRs (Objectives and Key Results), Backward Goal Setting, Vision Boards, and [employer]. The therapist introduced each method and facilitated discussions and activities related to applying these strategies. Patients actively contributed to the discussions by sharing personal insights and examples.    A: Patient demonstrated a strong grasp of the alternative goal-setting methods. Their active participation and willingness to share personal experiences indicate a high level of motivation and readiness to incorporate these strategies into their daily routines. This engagement is expected to positively impact their progress toward occupational therapy goals.   OCAIRS Mental Health Interview Summary of Client Scores:  Facilitates participation in occupation Allows participation in occupation Inhibits participation in occupation Restricts participation in occupation Comments:  Roles    X   Habits    X   Personal Causation   X    Values  X     Interests  X     Skills   X    Short-Term Goals   X    Long-term Goals  X    Interpretation of Past Experiences  X     Physical Environment  X     Social Environment   X    Readiness for Change   X      Need for Occupational Therapy:  4 Shows positive occupational participation, no need for OT.   3 Need for minimal intervention/consultative participation   2 Need for OT intervention indicated to restore/improve participation   1 Need for extensive OT intervention indicated to improve participation.  Referral for follow up services also recommended.   Assessment:  Patient demonstrates behavior that INHIBITS participation in occupation.  Patient will  benefit from occupational therapy intervention in order to improve time management, financial management, stress management, job readiness skills, social skills, and health management skills in preparation to return to full time community living and to be a productive community member.    Plan:  Patient will participate in skilled occupational therapy sessions individually or in a group setting to improve coping skills, psychosocial skills, and emotional skills required to return to prior level of function. Treatment will be 4-5 times per week for 4 weeks.              OT Education - 05/06/23 0941     Education Details OCAIRS / Five Alt Goal Setting Methods    Person(s) Educated Patient    Methods Explanation;Handout    Comprehension Verbalized understanding              OT Short Term Goals - 05/06/23 0943       OT SHORT TERM GOAL #1   Title Patient will be educated on strategies to improve psychosocial skills needed to participate fully in all daily, work, and leisure activities.    Time 4    Period Weeks    Status On-going    Target Date 06/05/23      OT SHORT TERM GOAL #2   Title Pt will apply psychosocial skills and coping mechanisms to daily activities in order to function independently and reintegrate into community dwelling    Status On-going      OT SHORT TERM GOAL #3   Title Pt will choose and/or engage in 1-3 socially engaging leisure activities to improve social participation skills upon reintegrating into community    Status On-going                      Plan - 05/06/23 0942     Clinical Impression Statement Pt presents w/ deficits across multiple psychosocial doamins that inhibit overall occupationla performance    OT Occupational Profile and History Problem Focused Assessment - Including review of records relating to presenting problem    Occupational performance deficits (Please refer to evaluation for details): Rest and  Sleep;Work;Leisure;Social Participation    Rehab Potential Good    Clinical Decision Making Limited treatment options, no task modification necessary    Comorbidities Affecting Occupational Performance: None    Modification or Assistance to Complete Evaluation  No modification of tasks or assist necessary to complete eval    OT Frequency 5x / week    OT Duration 4 weeks    OT Treatment/Interventions Psychosocial skills training;Coping strategies training    Consulted and Agree with Plan of Care Patient             Patient will benefit from skilled therapeutic intervention in order to improve the following deficits and impairments:  Visit Diagnosis: Difficulty coping  Severe episode of recurrent major depressive disorder, without psychotic features (HCC)  GAD (generalized anxiety disorder)  MDD (major depressive disorder), recurrent episode, moderate (HCC)  Major depressive disorder, recurrent episode, moderate degree (HCC)  Depression due to physical illness    Problem List Patient Active Problem List   Diagnosis Date Noted   GAD (generalized anxiety disorder) 04/22/2023   MDD (major depressive disorder), recurrent episode, moderate (HCC) 04/21/2023   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   Major depressive disorder, recurrent episode, moderate degree (HCC) 11/11/2022   Pain in left knee 06/04/2021   History of PCOS 02/21/2017   High risk medication use 09/21/2016   Rheumatoid arthritis involving multiple sites with positive rheumatoid factor (HCC) 09/21/2016   History of hypothyroidism 09/21/2016   History of depression 09/21/2016    Ted Mcalpine, OT 05/06/2023, 9:45 AM  Kerrin Champagne, OT   [facility] HOSPITALIZATION PROGRAM [address] [place], [place], [address] Phone: [phone]   Fax:  [phone]  Name: Casidee Branson MRN: [id number] Date of Birth: 07-25-1990
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
## 2023-05-06 NOTE — Therapy (Signed)
[facility] PARTIAL HOSPITALIZATION PROGRAM [address] [place], [place], [address] Phone: [phone]   Fax:  [phone]  Occupational Therapy Treatment Virtual Visit via Video Note  I connected with [name] on 05/06/23 at  8:00 AM EST by a video enabled telemedicine application and verified that I am speaking with the correct person using two identifiers.  Location: Patient: home Provider: office   I discussed the limitations of evaluation and management by telemedicine and the availability of in person appointments. The patient expressed understanding and agreed to proceed.    The patient was advised to call back or seek an in-person evaluation if the symptoms worsen or if the condition fails to improve as anticipated.  I provided 55 minutes of non-face-to-face time during this encounter.   Patient Details  Name: Shannon David MRN: [id number] Date of Birth: [birth year] No data recorded  Encounter Date: 05/04/2023   OT End of Session - 05/06/23 0953     Visit Number 2    Number of Visits 20    Date for OT Re-Evaluation 06/05/23    OT Start Time 1200    OT Stop Time 1255    OT Time Calculation (min) 55 min             Past Medical History:  Diagnosis Date   COVID    History of Clostridium difficile infection 01/2023   History of hypothyroidism    History of PCOS    Major depressive disorder, recurrent severe without psychotic features (HCC)    Passive suicidal ideations 11/11/2022   Rheumatoid arthritis (HCC)    Suicidal behavior with attempted self-injury (HCC) 04/22/2023    Past Surgical History:  Procedure Laterality Date   BUNIONECTOMY Right    FOOT SURGERY Right 08/24/2021    There were no vitals filed for this visit.   Subjective Assessment - 05/06/23 0953     Currently in Pain? No/denies    Pain Score 0-No pain                Group Session:  S: Doing okay today.   O:  Patient participated in  a virtual occupational therapy session focusing on five alternative goal-setting methods: CLEAR Goals, OKRs (Objectives and Key Results), Backward Goal Setting, Vision Boards, and [employer]. The therapist introduced each method and facilitated discussions and activities related to applying these strategies. Patients actively contributed to the discussions by sharing personal insights and examples.    A: Patient demonstrated a strong grasp of the alternative goal-setting methods. Their active participation and willingness to share personal experiences indicate a high level of motivation and readiness to incorporate these strategies into their daily routines. This engagement is expected to positively impact their progress toward occupational therapy goals.   P: Continue to attend PHP OT group sessions 5x week for 4 weeks to promote daily structure, social engagement, and opportunities to develop and utilize adaptive strategies to maximize functional performance in preparation for safe transition and integration back into school, work, and the community. Plan to address topic of tbd in next OT group session.                   OT Education - 05/06/23 0953     Education Details Five Alt Goal Setting Methods              OT Short Term Goals - 05/06/23 0943       OT SHORT TERM GOAL #1  Title Patient will be educated on strategies to improve psychosocial skills needed to participate fully in all daily, work, and leisure activities.    Time 4    Period Weeks    Status On-going    Target Date 06/05/23      OT SHORT TERM GOAL #2   Title Pt will apply psychosocial skills and coping mechanisms to daily activities in order to function independently and reintegrate into community dwelling    Status On-going      OT SHORT TERM GOAL #3   Title Pt will choose and/or engage in 1-3 socially engaging leisure activities to improve social participation skills upon reintegrating  into community    Status On-going                      Plan - 05/06/23 0954     Occupational performance deficits (Please refer to evaluation for details): Rest and Sleep;Work;Leisure;Social Participation             Patient will benefit from skilled therapeutic intervention in order to improve the following deficits and impairments:           Visit Diagnosis: Difficulty coping    Problem List Patient Active Problem List   Diagnosis Date Noted   GAD (generalized anxiety disorder) 04/22/2023   MDD (major depressive disorder), recurrent episode, moderate (HCC) 04/21/2023   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 [place], [place], [address] Phone: [phone]   Fax:  [phone]  Name: Kanyla Zeien MRN: [id number] Date of Birth: 1991-02-05

## 2023-05-06 NOTE — Psych (Addendum)
Virtual Visit via Video Note  I connected with Shannon David on 05/06/23 at  9:00 AM EST by a video enabled telemedicine application and verified that I am speaking with the correct person using two identifiers.  Location: Patient: pt's father's home in [place], [place] Provider: clinical office in [place], [place]   I discussed the limitations of evaluation and management by telemedicine and the availability of in person appointments. The patient expressed understanding and agreed to proceed.    I discussed the assessment and treatment plan with the patient. The patient was provided an opportunity to ask questions and all were answered. The patient agreed with the plan and demonstrated an understanding of the instructions.   The patient was advised to call back or seek an in-person evaluation if the symptoms worsen or if the condition fails to improve as anticipated.  I provided 240 minutes of non-face-to-face time during this encounter.   Robb Matar   [facility] PHP THERAPIST PROGRESS NOTE  Shannon David [id number]   Session Time: 9:00 am - 10:00 am  Participation Level: Active  Behavioral Response: CasualAlertDepressed  Type of Therapy: Group Therapy  Treatment Goals addressed: Coping  Progress Towards Goals: Progressing  Interventions: CBT, DBT, Solution Focused, Strength-based, Supportive, and Reframing  Therapist Response: Clinician led check-in regarding current stressors and situation, and review of patient completed daily inventory. Clinician utilized active listening and empathetic response and validated patient emotions. Clinician facilitated processing group on pertinent issues.?   Summary: Patient arrived within time allowed. Patient rates her mood at a 6 on a scale of 1-10 with 10 being best. Pt reported, "I'm not sick today. And seeing Shannon David, that makes me really happy." Reports she slept well. She states her appetite was poor due to being nauseated  and so she didn't eat much. Denies SI/SH thoughts and states she would like to discuss boundaries during processing. Pt able to process.?Pt engaged in discussion.?      Session Time: 10:00 am - 11:00 am  Participation Level: Active  Behavioral Response: CasualAlertDepressed  Type of Therapy: Group Therapy  Treatment Goals addressed: Coping  Progress Towards Goals: Progressing  Interventions: CBT, DBT, Solution Focused, Strength-based, Supportive, and Reframing  Therapist Response: Clinician led group on boundaries and reviewed the Assertive Bill of Rights. Clinician utilized CBT principles to inform discussion.   Summary: Pt engaged in discussion. She shares some background history regarding her parents and explains how she has had to set boundaries for her mother. She reports she particularly struggles with setting boundaries with her husband.    Session Time: 11:00 am - 12:00 pm  Participation Level: Active  Behavioral Response: CasualAlertDepressed  Type of Therapy: Group Therapy  Treatment Goals addressed: Coping  Progress Towards Goals: Progressing  Interventions: CBT, DBT, Solution Focused, Strength-based, Supportive, and Reframing  Therapist Response: Clinician led group on self-esteem. Group members were asked to complete handout entitled "My Strengths and Qualities" and then were invited to share their answers and process their feelings related to the assignment. Clinician utilized CBT principles to inform discussion.    Summary: Pt engaged in discussion. Pt shared some of her answers and discussed how it felt to identify strengths and qualities. Pt also shared that she has never had a safe space in which to share her feelings until she was hospitalized. She expresses support for her peers and is receptive to feedback.   Session Time: 12:00 pm - 1:00 pm  Participation Level: Active  Behavioral Response: CasualAlertDepressed  Type  of Therapy: Group  Therapy  Treatment Goals addressed: Coping  Progress Towards Goals: Progressing  Interventions: CBT, DBT, Solution Focused, Strength-based, Supportive, and Reframing  Therapist Response: 12:00 - 12:50 pm: See OT note. 12:50 - 1:00 pm: Clinician led check-out. Clinician assessed for immediate needs, medication compliance and efficacy, and safety concerns?  Summary: 12:00 - 12:50 pm: See OT note 12:50 - 1:00 pm: At check-out, patient contracts for safety.?Patient demonstrates progress as evidenced by her continued engagement and by being receptive to treatment. Patient denies SI/HI/self-harm thoughts at the end of group and agrees to seek help should those thoughts/feelings occur.?   Suicidal/Homicidal: Nowithout intent/plan  Plan: ?Pt will continue in PHP and medication management while continuing to work on decreasing depression symptoms,?SI, and anxiety symptoms,?and increasing the ability to self manage symptoms.   Collaboration of Care: Medication Management AEB Hillery Jacks, NP  Patient/Guardian was advised Release of Information must be obtained prior to any record release in order to collaborate their care with an outside provider. Patient/Guardian was advised if they have not already done so to contact the registration department to sign all necessary forms in order for [place] to release information regarding their care.   Consent: Patient/Guardian gives verbal consent for treatment and assignment of benefits for services provided during this visit. Patient/Guardian expressed understanding and agreed to proceed.   Diagnosis: GAD (generalized anxiety disorder) [F41.1]    1. GAD (generalized anxiety disorder)   2. Severe recurrent major depression without psychotic features [facility])      Shannon Lora, LCSW 05/06/2023

## 2023-05-09 ENCOUNTER — Telehealth (HOSPITAL_COMMUNITY): Payer: Self-pay | Admitting: Licensed Clinical Social Worker

## 2023-05-09 ENCOUNTER — Encounter (HOSPITAL_COMMUNITY): Payer: Self-pay

## 2023-05-09 ENCOUNTER — Other Ambulatory Visit (HOSPITAL_COMMUNITY): Payer: BC Managed Care – PPO | Attending: Psychiatry | Admitting: Professional

## 2023-05-09 ENCOUNTER — Other Ambulatory Visit (HOSPITAL_COMMUNITY): Payer: BC Managed Care – PPO

## 2023-05-09 DIAGNOSIS — F411 Generalized anxiety disorder: Secondary | ICD-10-CM | POA: Insufficient documentation

## 2023-05-09 DIAGNOSIS — F332 Major depressive disorder, recurrent severe without psychotic features: Secondary | ICD-10-CM | POA: Diagnosis present

## 2023-05-09 NOTE — Therapy (Signed)
[facility] PARTIAL HOSPITALIZATION PROGRAM [address] [place], [place], [address] Phone: [phone]   Fax:  [phone]  Occupational Therapy Treatment Virtual Visit via Video Note  I connected with Shannon David on 05/09/23 at  8:00 AM EST by a video enabled telemedicine application and verified that I am speaking with the correct person using two identifiers.  Location: Patient: home Provider: office   I discussed the limitations of evaluation and management by telemedicine and the availability of in person appointments. The patient expressed understanding and agreed to proceed.    The patient was advised to call back or seek an in-person evaluation if the symptoms worsen or if the condition fails to improve as anticipated.  I provided 55 minutes of non-face-to-face time during this encounter.   Patient Details  Name: Shannon David MRN: [id number] Date of Birth: 1991/04/17 No data recorded  Encounter Date: 05/06/2023   OT End of Session - 05/09/23 1015     Visit Number 3    Number of Visits 20    Date for OT Re-Evaluation 06/05/23    OT Start Time 1200    OT Stop Time 1255    OT Time Calculation (min) 55 min    Activity Tolerance Patient tolerated treatment well             Past Medical History:  Diagnosis Date   Anxiety    COVID    History of Clostridium difficile infection 01/2023   History of hypothyroidism    History of PCOS    Major depressive disorder, recurrent severe without psychotic features (HCC)    Passive suicidal ideations 11/11/2022   Rheumatoid arthritis (HCC)    Suicidal behavior with attempted self-injury (HCC) 04/22/2023    Past Surgical History:  Procedure Laterality Date   BUNIONECTOMY Right    FOOT SURGERY Right 08/24/2021    There were no vitals filed for this visit.   Subjective Assessment - 05/09/23 1014     Currently in Pain? No/denies    Pain Score 0-No pain                 Group Session:  S: Feeling better today.   O: The objective of this presentation is to provide a comprehensive understanding of the concept of "motivation" and its role in human behavior and well-being. The content covers various theories of motivation, including intrinsic and extrinsic motivators, and explores the psychological mechanisms that drive individuals to achieve goals, overcome obstacles, and make decisions. By diving into real-world applications, the presentation aims to offer actionable strategies for enhancing motivation in different life domains, such as work, relationships, and personal growth. Utilizing a multi-disciplinary approach, this presentation integrates insights from psychology, neuroscience, and behavioral economics to present a holistic view of motivation. The objective is not only to educate the audience about the complexities and driving forces behind motivation but also to equip them with practical tools and techniques to improve their own motivation levels. By the end of the presentation, attendees should have a well-rounded understanding of what motivates human actions and how to harness this knowledge for personal and professional betterment.   A: The patient demonstrates a high level of engagement during the session, actively participating in discussions about motivation theories and their applicability to their own life. They show keen interest in learning new strategies to improve their motivation and even offer examples from their own experiences that align with the theories presented. Their level of self-awareness and willingness to  invest in self-improvement suggest that they are well-positioned to benefit from the practical tools and techniques discussed. The patient's ability to articulate their goals and challenges further supports the likelihood of successfully implementing the strategies presented.    P: Continue to attend PHP OT group sessions 5x  week for 4 weeks to promote daily structure, social engagement, and opportunities to develop and utilize adaptive strategies to maximize functional performance in preparation for safe transition and integration back into school, work, and the community. Plan to address topic of pt 3 Motivation in next OT group session.                   OT Education - 05/09/23 1015     Education Details Motivation 2              OT Short Term Goals - 05/06/23 0943       OT SHORT TERM GOAL #1   Title Patient will be educated on strategies to improve psychosocial skills needed to participate fully in all daily, work, and leisure activities.    Time 4    Period Weeks    Status On-going    Target Date 06/05/23      OT SHORT TERM GOAL #2   Title Pt will apply psychosocial skills and coping mechanisms to daily activities in order to function independently and reintegrate into community dwelling    Status On-going      OT SHORT TERM GOAL #3   Title Pt will choose and/or engage in 1-3 socially engaging leisure activities to improve social participation skills upon reintegrating into community    Status On-going                      Plan - 05/09/23 1016     Occupational performance deficits (Please refer to evaluation for details): Rest and Sleep;Work;Leisure;Social Participation    Psychosocial Skills Coping Strategies;Habits;Interpersonal Interaction;Routines and Behaviors             Patient will benefit from skilled therapeutic intervention in order to improve the following deficits and impairments:       Psychosocial Skills: Coping Strategies, Habits, Interpersonal Interaction, Routines and Behaviors   Visit Diagnosis: Difficulty coping    Problem List Patient Active Problem List   Diagnosis Date Noted   Severe recurrent major depression without psychotic features (HCC) 05/06/2023   GAD (generalized anxiety disorder) 04/22/2023   MDD (major depressive  disorder), recurrent episode, moderate (HCC) 04/21/2023   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   Major depressive disorder, recurrent episode, moderate degree (HCC) 11/11/2022   Pain in left knee 06/04/2021   History of PCOS 02/21/2017   High risk medication use 09/21/2016   Rheumatoid arthritis involving multiple sites with positive rheumatoid factor (HCC) 09/21/2016   History of hypothyroidism 09/21/2016   History of depression 09/21/2016    Ted Mcalpine, OT 05/09/2023, 10:16 AM  Kerrin Champagne, OT   [facility] HOSPITALIZATION PROGRAM [address] [place], [place], [address] Phone: [phone]   Fax:  [phone]  Name: Shannon David MRN: [id number] Date of Birth: 1990-08-24

## 2023-05-10 ENCOUNTER — Other Ambulatory Visit (HOSPITAL_COMMUNITY): Payer: BC Managed Care – PPO

## 2023-05-10 ENCOUNTER — Other Ambulatory Visit (HOSPITAL_COMMUNITY): Payer: BC Managed Care – PPO | Attending: Psychiatry | Admitting: Licensed Clinical Social Worker

## 2023-05-10 DIAGNOSIS — F411 Generalized anxiety disorder: Secondary | ICD-10-CM | POA: Insufficient documentation

## 2023-05-10 DIAGNOSIS — F332 Major depressive disorder, recurrent severe without psychotic features: Secondary | ICD-10-CM | POA: Insufficient documentation

## 2023-05-10 DIAGNOSIS — Z79899 Other long term (current) drug therapy: Secondary | ICD-10-CM | POA: Diagnosis not present

## 2023-05-10 DIAGNOSIS — R4589 Other symptoms and signs involving emotional state: Secondary | ICD-10-CM

## 2023-05-10 DIAGNOSIS — M069 Rheumatoid arthritis, unspecified: Secondary | ICD-10-CM | POA: Diagnosis not present

## 2023-05-11 ENCOUNTER — Other Ambulatory Visit (HOSPITAL_COMMUNITY): Payer: BC Managed Care – PPO | Admitting: Licensed Clinical Social Worker

## 2023-05-11 ENCOUNTER — Other Ambulatory Visit (HOSPITAL_COMMUNITY): Payer: BC Managed Care – PPO

## 2023-05-11 DIAGNOSIS — Z79899 Other long term (current) drug therapy: Secondary | ICD-10-CM

## 2023-05-11 DIAGNOSIS — F332 Major depressive disorder, recurrent severe without psychotic features: Secondary | ICD-10-CM | POA: Diagnosis not present

## 2023-05-11 DIAGNOSIS — R4589 Other symptoms and signs involving emotional state: Secondary | ICD-10-CM

## 2023-05-11 DIAGNOSIS — F331 Major depressive disorder, recurrent, moderate: Secondary | ICD-10-CM

## 2023-05-11 NOTE — Progress Notes (Signed)
Spoke with patient via Teams video call, used 2 identifiers to correctly identify patient. States that groups are going well, she is learning coping skills and finds it helpful. She is having increased blood pressure when she takes it at home and asking if her medication needs to be increased. Explained that she needs to be seen at her PCP office to discuss hypertension and to get a recommendation on medication. She is agreeable to being seen for evaluation. On scale 1-10 as 10 being worst she rates depression at 4 and anxiety at 2. Denies SI/HI or AV hallucinations. Denies any side effects from medication. No issues or complaints
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
## 2023-05-12 ENCOUNTER — Other Ambulatory Visit (HOSPITAL_COMMUNITY): Payer: BC Managed Care – PPO

## 2023-05-12 ENCOUNTER — Telehealth (HOSPITAL_COMMUNITY): Payer: Self-pay | Admitting: Licensed Clinical Social Worker

## 2023-05-12 ENCOUNTER — Encounter (HOSPITAL_COMMUNITY): Payer: Self-pay

## 2023-05-12 NOTE — Therapy (Signed)
[facility] PARTIAL HOSPITALIZATION PROGRAM [address] [place], [place], [address] Phone: [phone]   Fax:  [phone]  Occupational Therapy Treatment Virtual Visit via Video Note  I connected with Shannon David on 05/12/23 at  8:00 AM EST by a video enabled telemedicine application and verified that I am speaking with the correct person using two identifiers.  Location: Patient: home Provider: office   I discussed the limitations of evaluation and management by telemedicine and the availability of in person appointments. The patient expressed understanding and agreed to proceed.    The patient was advised to call back or seek an in-person evaluation if the symptoms worsen or if the condition fails to improve as anticipated.  I provided 55 minutes of non-face-to-face time during this encounter.   Patient Details  Name: Shannon David MRN: [id number] Date of Birth: 08-Feb-1991 No data recorded  Encounter Date: 05/10/2023   OT End of Session - 05/12/23 2003     Visit Number 4    Number of Visits 20    Date for OT Re-Evaluation 06/05/23    OT Start Time 1200    OT Stop Time 1255    OT Time Calculation (min) 55 min             Past Medical History:  Diagnosis Date   Anxiety    COVID    History of Clostridium difficile infection 01/2023   History of hypothyroidism    History of PCOS    Major depressive disorder, recurrent severe without psychotic features (HCC)    Passive suicidal ideations 11/11/2022   Rheumatoid arthritis (HCC)    Suicidal behavior with attempted self-injury (HCC) 04/22/2023    Past Surgical History:  Procedure Laterality Date   BUNIONECTOMY Right    FOOT SURGERY Right 08/24/2021    There were no vitals filed for this visit.   Subjective Assessment - 05/12/23 2002     Currently in Pain? No/denies    Pain Score 0-No pain                 Group Session:  S: Doing better today.   O: The  objective of this presentation is to provide a comprehensive understanding of the concept of "motivation" and its role in human behavior and well-being. The content covers various theories of motivation, including intrinsic and extrinsic motivators, and explores the psychological mechanisms that drive individuals to achieve goals, overcome obstacles, and make decisions. By diving into real-world applications, the presentation aims to offer actionable strategies for enhancing motivation in different life domains, such as work, relationships, and personal growth. Utilizing a multi-disciplinary approach, this presentation integrates insights from psychology, neuroscience, and behavioral economics to present a holistic view of motivation. The objective is not only to educate the audience about the complexities and driving forces behind motivation but also to equip them with practical tools and techniques to improve their own motivation levels. By the end of the presentation, attendees should have a well-rounded understanding of what motivates human actions and how to harness this knowledge for personal and professional betterment.   A: The patient demonstrates a high level of engagement during the session, actively participating in discussions about motivation theories and their applicability to their own life. They show keen interest in learning new strategies to improve their motivation and even offer examples from their own experiences that align with the theories presented. Their level of self-awareness and willingness to invest in self-improvement suggest that they are well-positioned  to benefit from the practical tools and techniques discussed. The patient's ability to articulate their goals and challenges further supports the likelihood of successfully implementing the strategies presented.    P: Continue to attend PHP OT group sessions 5x week for 4 weeks to promote daily structure, social engagement,  and opportunities to develop and utilize adaptive strategies to maximize functional performance in preparation for safe transition and integration back into school, work, and the community. Plan to address topic of tbd in next OT group session.                  OT Education - 05/12/23 2003     Education Details Motivation              OT Short Term Goals - 05/06/23 0943       OT SHORT TERM GOAL #1   Title Patient will be educated on strategies to improve psychosocial skills needed to participate fully in all daily, work, and leisure activities.    Time 4    Period Weeks    Status On-going    Target Date 06/05/23      OT SHORT TERM GOAL #2   Title Pt will apply psychosocial skills and coping mechanisms to daily activities in order to function independently and reintegrate into community dwelling    Status On-going      OT SHORT TERM GOAL #3   Title Pt will choose and/or engage in 1-3 socially engaging leisure activities to improve social participation skills upon reintegrating into community    Status On-going                      Plan - 05/12/23 2003     Psychosocial Skills Coping Strategies;Habits;Interpersonal Interaction;Routines and Behaviors             Patient will benefit from skilled therapeutic intervention in order to improve the following deficits and impairments:       Psychosocial Skills: Coping Strategies, Habits, Interpersonal Interaction, Routines and Behaviors   Visit Diagnosis: Difficulty coping    Problem List Patient Active Problem List   Diagnosis Date Noted   Severe recurrent major depression without psychotic features (HCC) 05/06/2023   GAD (generalized anxiety disorder) 04/22/2023   MDD (major depressive disorder), recurrent episode, moderate (HCC) 04/21/2023   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   Major depressive disorder, recurrent episode, moderate degree (HCC) 11/11/2022   Pain in left knee  06/04/2021   History of PCOS 02/21/2017   High risk medication use 09/21/2016   Rheumatoid arthritis involving multiple sites with positive rheumatoid factor (HCC) 09/21/2016   History of hypothyroidism 09/21/2016   History of depression 09/21/2016    Ted Mcalpine, OT 05/12/2023, 8:04 PM  Kerrin Champagne, OT   [facility] HOSPITALIZATION PROGRAM [address] [place], [place], [address] Phone: [phone]   Fax:  [phone]  Name: Shannon David MRN: [id number] Date of Birth: 24-Apr-1991

## 2023-05-13 ENCOUNTER — Other Ambulatory Visit (HOSPITAL_COMMUNITY): Payer: BC Managed Care – PPO

## 2023-05-13 ENCOUNTER — Other Ambulatory Visit (HOSPITAL_COMMUNITY): Payer: BC Managed Care – PPO | Admitting: Licensed Clinical Social Worker

## 2023-05-13 DIAGNOSIS — R4589 Other symptoms and signs involving emotional state: Secondary | ICD-10-CM

## 2023-05-13 DIAGNOSIS — F332 Major depressive disorder, recurrent severe without psychotic features: Secondary | ICD-10-CM

## 2023-05-13 DIAGNOSIS — F411 Generalized anxiety disorder: Secondary | ICD-10-CM

## 2023-05-13 NOTE — Psych (Signed)
Virtual Visit via Video Note  I connected with Shannon David on 05/09/23 at  9:00 AM EST by a video enabled telemedicine application and verified that I am speaking with the correct person using two identifiers.  Location: Patient: pt's father's home  Provider: clinical  home office   I discussed the limitations of evaluation and management by telemedicine and the availability of in person appointments. The patient expressed understanding and agreed to proceed.    I discussed the assessment and treatment plan with the patient. The patient was provided an opportunity to ask questions and all were answered. The patient agreed with the plan and demonstrated an understanding of the instructions.   The patient was advised to call back or seek an in-person evaluation if the symptoms worsen or if the condition fails to improve as anticipated.  I provided 240 minutes of non-face-to-face time during this encounter.   Quinn Axe, [facility]   [facility] PHP THERAPIST PROGRESS NOTE  Shannon David [phone]   Session Time: 9:00 am - 10:00 am  Participation Level: Active  Behavioral Response: CasualAlertDepressed  Type of Therapy: Group Therapy  Treatment Goals addressed: Coping  Progress Towards Goals: Progressing  Interventions: CBT, DBT, Solution Focused, Strength-based, Supportive, and Reframing  Therapist Response: Clinician led check-in regarding current stressors and situation, and review of patient completed daily inventory. Clinician utilized active listening and empathetic response and validated patient emotions. Clinician facilitated processing group on pertinent issues.?   Summary: Patient arrived within time allowed. Patient rates her mood at a 5 on a scale of 1-10 with 10 being best. Pt reported, "I'm tired." Reports she slept about 7 hours but it was very broken. She states her appetite is still decreased. She reports she got up on Saturday, put on make-up, and went to  run errands. She went to her "husband's house" and spent time with her dog. Sunday, she woke up with high blood pressure and didn't feel well. Pt reports it gives her hope to see pt's further along in PHP and is reminded this is a journey with ups and downs. Pt able to process.?Pt engaged in discussion.?      Session Time: 10:00 am - 11:00 am  Participation Level: Active  Behavioral Response: CasualAlertAnxious  Type of Therapy: Group Therapy  Treatment Goals addressed: Coping  Progress Towards Goals: Progressing  Interventions: CBT, DBT, Solution Focused, Strength-based, Supportive, and Reframing  Therapist Response: Clinician led group on but/and/should statements. Group discussed how more than one thing can be true at the same time using "and" statements. For example, "I am making progress AND I am still working on my depression." Group discussed the importance of how we speak to self and others. Clinician utilized CBT principles to inform discussion.  Summary: Pt engaged in discussion. Pt reports she relates to the issues that negative self-talk can bring and how we speak to self is important. Pt reports understanding.     Session Time: 11:00 am - 12:00 pm  Participation Level: Active  Behavioral Response: CasualAlertAnxious  Type of Therapy: Group Therapy  Treatment Goals addressed: Coping  Progress Towards Goals: Progressing  Interventions: CBT, DBT, Solution Focused, Strength-based, Supportive, and Reframing  Therapist Response: Clinician led group on mindfulness. Group discussed the "What" and "How" skills of mindfulness. Group discussed ways to set aside time to practice mindfulness, such as doing puzzles, and how to build it into everyday life, for example, when doing chores or other activities done on "autopilot."  Summary: Pt engaged  in discussion. Pt shared she would like to practice mindfulness while on social media, showering, driving, and school.   Session  Time: 12:00 pm - 1:00 pm  Participation Level: Active  Behavioral Response: CasualAlertDepressed  Type of Therapy: Group Therapy  Treatment Goals addressed: Coping  Progress Towards Goals: Progressing  Interventions: CBT, DBT, Solution Focused, Strength-based, Supportive, and Reframing  Therapist Response: 12:00 - 12:50 pm: See OT note. 12:50 - 1:00 pm: Clinician led check-out. Clinician assessed for immediate needs, medication compliance and efficacy, and safety concerns?  Summary: 12:00 - 12:50 pm: See OT note 12:50 - 1:00 pm: At check-out, patient contracts for safety.?Patient demonstrates progress as evidenced by her continued engagement and by being receptive to treatment. Patient denies SI/HI/self-harm thoughts at the end of group and agrees to seek help should those thoughts/feelings occur.?   Suicidal/Homicidal: Nowithout intent/plan  Plan: ?Pt will continue in PHP and medication management while continuing to work on decreasing depression symptoms,?SI, and anxiety symptoms,?and increasing the ability to self manage symptoms.   Collaboration of Care: Medication Management AEB Hillery Jacks, NP  Patient/Guardian was advised Release of Information must be obtained prior to any record release in order to collaborate their care with an outside provider. Patient/Guardian was advised if they have not already done so to contact the registration department to sign all necessary forms in order for [place] to release information regarding their care.   Consent: Patient/Guardian gives verbal consent for treatment and assignment of benefits for services provided during this visit. Patient/Guardian expressed understanding and agreed to proceed.   Diagnosis: Severe recurrent major depression without psychotic features (HCC) [F33.2]    1. Severe recurrent major depression without psychotic features (HCC)   2. GAD (generalized anxiety disorder)      Quinn Axe, [facility]

## 2023-05-16 ENCOUNTER — Other Ambulatory Visit (HOSPITAL_COMMUNITY): Payer: BC Managed Care – PPO | Admitting: Licensed Clinical Social Worker

## 2023-05-16 ENCOUNTER — Ambulatory Visit (HOSPITAL_COMMUNITY): Payer: BC Managed Care – PPO | Admitting: Clinical

## 2023-05-16 DIAGNOSIS — F411 Generalized anxiety disorder: Secondary | ICD-10-CM

## 2023-05-16 DIAGNOSIS — F332 Major depressive disorder, recurrent severe without psychotic features: Secondary | ICD-10-CM

## 2023-05-17 ENCOUNTER — Encounter (HOSPITAL_COMMUNITY): Payer: Self-pay | Admitting: Family

## 2023-05-17 ENCOUNTER — Other Ambulatory Visit (HOSPITAL_COMMUNITY): Payer: BC Managed Care – PPO | Admitting: Licensed Clinical Social Worker

## 2023-05-17 ENCOUNTER — Other Ambulatory Visit (HOSPITAL_COMMUNITY): Payer: BC Managed Care – PPO

## 2023-05-17 DIAGNOSIS — F331 Major depressive disorder, recurrent, moderate: Secondary | ICD-10-CM

## 2023-05-17 DIAGNOSIS — F411 Generalized anxiety disorder: Secondary | ICD-10-CM

## 2023-05-17 DIAGNOSIS — R4589 Other symptoms and signs involving emotional state: Secondary | ICD-10-CM

## 2023-05-17 DIAGNOSIS — F332 Major depressive disorder, recurrent severe without psychotic features: Secondary | ICD-10-CM

## 2023-05-17 MED ORDER — TRAZODONE HCL 100 MG PO TABS: 100.0000 mg | ORAL_TABLET | Freq: Every evening | ORAL | 0 refills | Status: DC | PRN

## 2023-05-17 NOTE — Progress Notes (Signed)
Virtual Visit via Video Note  I connected with Shannon David on 05/17/23 at  9:00 AM EST by a video enabled telemedicine application and verified that I am speaking with the correct person using two identifiers.  Location: Patient: Home Provider: Office   I discussed the limitations of evaluation and management by telemedicine and the availability of in person appointments. The patient expressed understanding and agreed to proceed.   I discussed the assessment and treatment plan with the patient. The patient was provided an opportunity to ask questions and all were answered. The patient agreed with the plan and demonstrated an understanding of the instructions.   The patient was advised to call back or seek an in-person evaluation if the symptoms worsen or if the condition fails to improve as anticipated.  I provided 00 minutes of non-face-to-face time during this encounter.   Shannon Rack, NP   [facility] MD/PA/NP OP Progress Note  05/17/2023 11:50 AM Shannon David  MRN:  [id number]  Chief Complaint: I do not have any concerns at this time I had a really good day yesterday some tired today.  HPI: Shannon David was seen and evaluated via telehealth assessment.  She is pleasant, calm and cooperative throughout this assessment.  She presents with a bright affect.  Appears elated related to having a good day on yesterday.  denying suicidal or homicidal ideations.  Reports she has been taking medications as indicated.  Patient is requesting a refill with her trazodone.  States she had a very good weekend and a very good day on yesterday.  States she was hanging out with friends.  Unable to identify any goals at this visit.  Reports a good appetite.  States she is resting well throughout the night.  Rating her depression and anxiety 5 out of 10 with 10 being the worst.  Patient to continue with partial hospitalization programming.  During evaluation [name] is sitting, she is  alert/oriented x 4; calm/cooperative; and mood congruent with affect.  Patient is speaking in a clear tone at moderate volume, and normal pace; with good eye contact. Her thought process is coherent and relevant; There is no indication that she is currently responding to internal/external stimuli or experiencing delusional thought content.  Patient denies suicidal/self-harm/homicidal ideation, psychosis, and paranoia.  Patient has remained calm throughout assessment and has answered questions appropriately.   Visit Diagnosis:    ICD-10-CM   1. MDD (major depressive disorder), recurrent episode, moderate (HCC)  F33.1     2. GAD (generalized anxiety disorder)  F41.1       Past Psychiatric History: Major depressive disorder, generalized anxiety disorder, self injures behaviors.  Past Medical History:  Past Medical History:  Diagnosis Date   Anxiety    COVID    History of Clostridium difficile infection 01/2023   History of hypothyroidism    History of PCOS    Major depressive disorder, recurrent severe without psychotic features (HCC)    Passive suicidal ideations 11/11/2022   Rheumatoid arthritis (HCC)    Suicidal behavior with attempted self-injury (HCC) 04/22/2023    Past Surgical History:  Procedure Laterality Date   BUNIONECTOMY Right    FOOT SURGERY Right 08/24/2021    Family Psychiatric History: See chart  Family History:  Family History  Problem Relation Age of Onset   Depression Mother    Bipolar disorder Mother    Hypertension Mother    Clotting disorder Mother    Depression Brother    Rheum arthritis Brother  Lung cancer Paternal Uncle    Cancer Maternal Grandmother        lung    Social History:  Social History   Socioeconomic History   Marital status: Legally Separated    Spouse name: Not on file   Number of children: 0   Years of education: Not on file   Highest education level: High school graduate  Occupational History   Not on file  Tobacco  Use   Smoking status: Never    Passive exposure: Never   Smokeless tobacco: Never  Vaping Use   Vaping status: Never Used  Substance and Sexual Activity   Alcohol use: No   Drug use: No   Sexual activity: Not on file  Other Topics Concern   Not on file  Social History Narrative   Left Handed    Lives in a one story home   Drinks Caffeine    Social Determinants of Health   Financial Resource Strain: Not on file  Food Insecurity: Patient Declined (04/21/2023)   Hunger Vital Sign    Worried About Running Out of Food in the Last Year: Patient declined    Ran Out of Food in the Last Year: Patient declined  Transportation Needs: No Transportation Needs (04/21/2023)   PRAPARE - Administrator, Civil Service (Medical): No    Lack of Transportation (Non-Medical): No  Physical Activity: Not on file  Stress: Not on file  Social Connections: Not on file    Allergies:  Allergies  Allergen Reactions   Keflex [Cephalexin] Hives    Metabolic Disorder Labs: No results found for: "HGBA1C", "MPG" No results found for: "PROLACTIN" Lab Results  Component Value Date   CHOL 151 12/28/2022   TRIG 65.0 12/28/2022   HDL 48.50 12/28/2022   CHOLHDL 3 12/28/2022   VLDL 13.0 12/28/2022   LDLCALC 89 12/28/2022   LDLCALC 74 10/18/2022   Lab Results  Component Value Date   TSH 5.062 (H) 11/11/2022    Therapeutic Level Labs: No results found for: "LITHIUM" No results found for: "VALPROATE" No results found for: "CBMZ"  Current Medications: Current Outpatient Medications  Medication Sig Dispense Refill   albuterol (VENTOLIN HFA) 108 (90 Base) MCG/ACT inhaler Inhale 1-2 puffs into the lungs every 6 (six) hours as needed for wheezing or shortness of breath. 1 each 0   Cholecalciferol (VITAMIN D3) 50 MCG (2000 UT) capsule Take 2,000 Units by mouth daily.     folic acid (FOLVITE) 1 MG tablet Take 2 tablets (2 mg total) by mouth daily. 60 tablet 0   gabapentin (NEURONTIN) 300  MG capsule Take 1 capsule (300 mg total) by mouth 2 (two) times daily. 60 capsule 0   hydroxychloroquine (PLAQUENIL) 200 MG tablet TAKE 1 TABLET BY MOUTH TWICE A DAY MONDAY-FRIDAY 120 tablet 0   levothyroxine (SYNTHROID) 112 MCG tablet Take 1 tablet (112 mcg total) by mouth daily. 30 tablet 0   methotrexate (RHEUMATREX) 2.5 MG tablet TAKE 8 TABLETS (20 MG TOTAL) BY MOUTH ONCE A WEEK. CAUTION:CHEMOTHERAPY. PROTECT FROM LIGHT. (Patient taking differently: Take 20 mg by mouth once a week. TAKE 8 TABLETS (20 MG TOTAL) BY MOUTH ONCE A WEEK. CAUTION:CHEMOTHERAPY. PROTECT FROM LIGHT. Takes every Wednesday) 96 tablet 0   PARoxetine (PAXIL) 30 MG tablet Take 2 tablets (60 mg total) by mouth daily. 60 tablet 0   propranolol (INDERAL) 10 MG tablet Take 1 tablet (10 mg total) by mouth 2 (two) times daily. 60 tablet 0   Tocilizumab (  ACTEMRA) 80 MG/4ML SOLN injection INFUSE 4MG /KG INTRAVENOUSLY EVERY 4 WEEKS 24 mL 5   traZODone (DESYREL) 100 MG tablet Take 1 tablet (100 mg total) by mouth at bedtime as needed for sleep. 30 tablet 0   No current facility-administered medications for this visit.     Musculoskeletal: Telehealth assessment- AI background  Psychiatric Specialty Exam: Review of Systems  Psychiatric/Behavioral:  Positive for sleep disturbance. Negative for agitation. The patient is not nervous/anxious.   All other systems reviewed and are negative.   There were no vitals taken for this visit.There is no height or weight on file to calculate BMI.  General Appearance: Casual  Eye Contact:  Good  Speech:  Clear and Coherent  Volume:  Normal  Mood:  Anxious and Depressed  Affect:  Congruent  Thought Process:  Coherent  Orientation:  Full (Time, Place, and Person)  Thought Content: WDL   Suicidal Thoughts:  No  Homicidal Thoughts:  No  Memory:  Immediate;   Good Recent;   Good  Judgement:  Good  Insight:  Good  Psychomotor Activity:  Normal  Concentration:  Concentration: Fair  Recall:   Good  Fund of Knowledge: Good  Language: Good  Akathisia:  No  Handed:  Right  AIMS (if indicated): not done  Assets:  Communication Skills Desire for Improvement Resilience Social Support  ADL's:  Intact  Cognition: WNL  Sleep:  Fair   Screenings: AUDIT    Flowsheet Row [facility]  Alcohol Use Disorder Identification Test Final Score (AUDIT) 0      GAD-7    Flowsheet Row Counselor from 12/23/2022 in [facility]  Total GAD-7 Score 2      PHQ2-9    Flowsheet Row Counselor from [facility] Counselor from [facility] Clinical Support from 04/27/2023 in [facility] from 12/23/2022 in [facility] Total Score 2 2 1 2   PHQ-9 Total Score 13 15 4 11       Flowsheet Row Counselor from 05/06/2023 [facility] Counselor from 04/28/2023 [facility] Ad[facility] ([facility]  C-SSRS RISK CATEGORY Low Risk High Risk No Risk        Assessment and Plan:  Continue partial hospitalization programming Continue medications as directed Refill to trazodone 100 mg nightly as needed made available  Collaboration of Care: Collaboration of Care: Psychiatrist AEB MD Esmeralda Arthur and therapist Ruthine Dose  Patient/Guardian was advised Release of Information must be obtained prior to any record release in order to collaborate their care with an outside provider. Patient/Guardian was advised if they have not already done so to contact the registration department to sign all necessary forms in order for [place] to release information regarding their care.    Consent: Patient/Guardian gives verbal consent for treatment and assignment of benefits for services provided during this visit. Patient/Guardian expressed understanding and agreed to proceed.    Shannon Rack, NP 05/17/2023, 11:50 AM

## 2023-05-18 ENCOUNTER — Encounter (HOSPITAL_COMMUNITY): Payer: Self-pay

## 2023-05-18 ENCOUNTER — Other Ambulatory Visit (HOSPITAL_COMMUNITY): Payer: BC Managed Care – PPO | Admitting: Licensed Clinical Social Worker

## 2023-05-18 ENCOUNTER — Other Ambulatory Visit (HOSPITAL_COMMUNITY): Payer: BC Managed Care – PPO

## 2023-05-18 DIAGNOSIS — F411 Generalized anxiety disorder: Secondary | ICD-10-CM

## 2023-05-18 DIAGNOSIS — F332 Major depressive disorder, recurrent severe without psychotic features: Secondary | ICD-10-CM

## 2023-05-18 DIAGNOSIS — R4589 Other symptoms and signs involving emotional state: Secondary | ICD-10-CM

## 2023-05-18 NOTE — Therapy (Signed)
[facility] PARTIAL HOSPITALIZATION PROGRAM [address] [place], [place], [address] Phone: [phone]   Fax:  [phone]  Occupational Therapy Treatment Virtual Visit via Video Note  I connected with Shannon David on 05/18/23 at  8:00 AM EST by a video enabled telemedicine application and verified that I am speaking with the correct person using two identifiers.  Location: Patient: home Provider: office   I discussed the limitations of evaluation and management by telemedicine and the availability of in person appointments. The patient expressed understanding and agreed to proceed.    The patient was advised to call back or seek an in-person evaluation if the symptoms worsen or if the condition fails to improve as anticipated.  I provided 55 minutes of non-face-to-face time during this encounter.   Patient Details  Name: Shannon David MRN: [id number] Date of Birth: [birth year] No data recorded  Encounter Date: 05/11/2023   OT End of Session - 05/18/23 0857     Visit Number 5    Number of Visits 20    Date for OT Re-Evaluation 06/05/23    OT Start Time 1200    OT Stop Time 1255    OT Time Calculation (min) 55 min             Past Medical History:  Diagnosis Date   Anxiety    COVID    History of Clostridium difficile infection 01/2023   History of hypothyroidism    History of PCOS    Major depressive disorder, recurrent severe without psychotic features (HCC)    Passive suicidal ideations 11/11/2022   Rheumatoid arthritis (HCC)    Suicidal behavior with attempted self-injury (HCC) 04/22/2023    Past Surgical History:  Procedure Laterality Date   BUNIONECTOMY Right    FOOT SURGERY Right 08/24/2021    There were no vitals filed for this visit.   Subjective Assessment - 05/18/23 0856     Currently in Pain? No/denies    Pain Score 0-No pain                 Group Session:  S: Doing better today I think.   O:  The primary objective of this group therapy session is to equip participants with practical strategies and coping mechanisms to effectively manage accumulated tasks that seem insurmountable due to mental health challenges such as depression and anxiety. The group aims to build a supportive environment wherein individuals can openly discuss their struggles, share experiences, and learn from one another. The session's strategies will encompass goal setting, time management, task breakdown, creating conducive environments, and mindfulness practices. We aim to help participants perceive tasks as manageable units rather than overwhelming piles and encourage an approach of progress over perfection.   A: Patient demonstrated active engagement throughout the session. They contributed valuable insights during discussions, asked relevant questions, and showed enthusiasm towards learning new strategies. Their interaction with other group members was respectful and empathetic, contributing to a supportive group dynamic. Patient appeared to resonate with the strategies of "breaking it down" and "creating a conducive environment," and they shared plans to incorporate these strategies into their daily routine. Their active participation, willingness to share personal experiences, and acceptance of new strategies demonstrate a strong benefit from this therapy session.    P: Continue to attend PHP OT group sessions 5x week for 4 weeks to promote daily structure, social engagement, and opportunities to develop and utilize adaptive strategies to maximize functional performance in preparation for safe transition  and integration back into school, work, and the community. Plan to address topic of pt 2 in next OT group session.                  OT Education - 05/18/23 0856     Education Details Task Accumulation              OT Short Term Goals - 05/06/23 0943       OT SHORT TERM GOAL #1   Title  Patient will be educated on strategies to improve psychosocial skills needed to participate fully in all daily, work, and leisure activities.    Time 4    Period Weeks    Status On-going    Target Date 06/05/23      OT SHORT TERM GOAL #2   Title Pt will apply psychosocial skills and coping mechanisms to daily activities in order to function independently and reintegrate into community dwelling    Status On-going      OT SHORT TERM GOAL #3   Title Pt will choose and/or engage in 1-3 socially engaging leisure activities to improve social participation skills upon reintegrating into community    Status On-going                      Plan - 05/18/23 0857     Psychosocial Skills Coping Strategies;Habits;Interpersonal Interaction;Routines and Behaviors             Patient will benefit from skilled therapeutic intervention in order to improve the following deficits and impairments:       Psychosocial Skills: Coping Strategies, Habits, Interpersonal Interaction, Routines and Behaviors   Visit Diagnosis: Difficulty coping    Problem List Patient Active Problem List   Diagnosis Date Noted   Severe recurrent major depression without psychotic features (HCC) 05/06/2023   GAD (generalized anxiety disorder) 04/22/2023   MDD (major depressive disorder), recurrent episode, moderate (HCC) 04/21/2023   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   Major depressive disorder, recurrent episode, moderate degree (HCC) 11/11/2022   Pain in left knee 06/04/2021   History of PCOS 02/21/2017   High risk medication use 09/21/2016   Rheumatoid arthritis involving multiple sites with positive rheumatoid factor (HCC) 09/21/2016   History of hypothyroidism 09/21/2016   History of depression 09/21/2016    Ted Mcalpine, OT 05/18/2023, 8:57 AM Kerrin Champagne, OT   [facility] HOSPITALIZATION PROGRAM [address] [place], [place],  [address] Phone: [phone]   Fax:  [phone]  Name: Shannon David MRN: [id number] Date of Birth: 06/08/1991

## 2023-05-18 NOTE — Therapy (Signed)
[facility] PARTIAL HOSPITALIZATION PROGRAM [address] [place], [place], [address] Phone: [phone]   Fax:  [phone]  Occupational Therapy Treatment Virtual Visit via Video Note  I connected with [name] on 05/18/23 at  8:00 AM EST by a video enabled telemedicine application and verified that I am speaking with the correct person using two identifiers.  Location: Patient: home Provider: office   I discussed the limitations of evaluation and management by telemedicine and the availability of in person appointments. The patient expressed understanding and agreed to proceed.    The patient was advised to call back or seek an in-person evaluation if the symptoms worsen or if the condition fails to improve as anticipated.  I provided 55 minutes of non-face-to-face time during this encounter.  Patient Details  Name: Shannon David MRN: [id number] Date of Birth: 03-04-1991 No data recorded  Encounter Date: 05/13/2023   OT End of Session - 05/18/23 0924     Visit Number 6    Number of Visits 20    Date for OT Re-Evaluation 06/05/23    OT Start Time 1200    OT Stop Time 1255    OT Time Calculation (min) 55 min             Past Medical History:  Diagnosis Date   Anxiety    COVID    History of Clostridium difficile infection 01/2023   History of hypothyroidism    History of PCOS    Major depressive disorder, recurrent severe without psychotic features (HCC)    Passive suicidal ideations 11/11/2022   Rheumatoid arthritis (HCC)    Suicidal behavior with attempted self-injury (HCC) 04/22/2023    Past Surgical History:  Procedure Laterality Date   BUNIONECTOMY Right    FOOT SURGERY Right 08/24/2021    There were no vitals filed for this visit.   Subjective Assessment - 05/18/23 0923     Currently in Pain? No/denies    Pain Score 0-No pain                 Group Session:  S: Feeling better today.   O: In this  communication group therapy session, facilitated by an occupational therapist, participants explored several key subtopics aimed at enhancing their interpersonal skills. The session began with a discussion on the use of "I" and "AND" statements, emphasizing personal responsibility and constructive language in expressing feelings and needs. Participants then practiced active listening techniques to improve their ability to fully understand and respond to others. The group also delved into assertive communication, learning how to express themselves confidently and respectfully. Emotional regulation skills were addressed, providing strategies for managing emotions during interactions. Social skills training included role-playing scenarios to build confidence in various social contexts. Feedback and reflection were integral parts of the session, with participants offering and receiving constructive feedback to foster personal growth. The group identified common barriers to effective communication, such as anxiety, fear of judgment, and past negative experiences, and discussed strategies to overcome these challenges, including mindfulness practices and building a supportive network.   A: The patient actively participated in the group therapy session, demonstrating a high level of engagement and enthusiasm. They contributed to discussions on "I" and "AND" statements by sharing personal examples and insights. During the active listening exercises, the patient was attentive and provided thoughtful feedback to peers. They effectively practiced assertive communication techniques and showed a clear understanding of emotional regulation strategies. In social skills role-playing, the patient was confident  and responsive, indicating a good grasp of the concepts. The patient also engaged in the feedback and reflection segment, offering constructive comments and showing receptivity to feedback from others. Their proactive  approach and willingness to explore personal barriers to communication suggest significant progress and motivation to improve interpersonal skills.    P: Continue to attend PHP OT group sessions 5x week for 4 weeks to promote daily structure, social engagement, and opportunities to develop and utilize adaptive strategies to maximize functional performance in preparation for safe transition and integration back into school, work, and the community. Plan to address topic of pt 2 in next OT group session.                  OT Education - 05/18/23 0924     Education Details communication              OT Short Term Goals - 05/06/23 0943       OT SHORT TERM GOAL #1   Title Patient will be educated on strategies to improve psychosocial skills needed to participate fully in all daily, work, and leisure activities.    Time 4    Period Weeks    Status On-going    Target Date 06/05/23      OT SHORT TERM GOAL #2   Title Pt will apply psychosocial skills and coping mechanisms to daily activities in order to function independently and reintegrate into community dwelling    Status On-going      OT SHORT TERM GOAL #3   Title Pt will choose and/or engage in 1-3 socially engaging leisure activities to improve social participation skills upon reintegrating into community    Status On-going                      Plan - 05/18/23 0924     Psychosocial Skills Coping Strategies;Habits;Interpersonal Interaction;Routines and Behaviors             Patient will benefit from skilled therapeutic intervention in order to improve the following deficits and impairments:       Psychosocial Skills: Coping Strategies, Habits, Interpersonal Interaction, Routines and Behaviors   Visit Diagnosis: Difficulty coping    Problem List Patient Active Problem List   Diagnosis Date Noted   Severe recurrent major depression without psychotic features (HCC) 05/06/2023   GAD  (generalized anxiety disorder) 04/22/2023   MDD (major depressive disorder), recurrent episode, moderate (HCC) 04/21/2023   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   Major depressive disorder, recurrent episode, moderate degree (HCC) 11/11/2022   Pain in left knee 06/04/2021   History of PCOS 02/21/2017   High risk medication use 09/21/2016   Rheumatoid arthritis involving multiple sites with positive rheumatoid factor (HCC) 09/21/2016   History of hypothyroidism 09/21/2016   History of depression 09/21/2016    Ted Mcalpine, OT 05/18/2023, 9:25 AM  Kerrin Champagne, OT   [facility] HOSPITALIZATION PROGRAM [address] [place], [place], [address] Phone: [phone]   Fax:  [phone]  Name: Shannon David MRN: [id number] Date of Birth: 22-Aug-1990

## 2023-05-19 ENCOUNTER — Other Ambulatory Visit (HOSPITAL_COMMUNITY): Payer: BC Managed Care – PPO

## 2023-05-19 ENCOUNTER — Other Ambulatory Visit (HOSPITAL_COMMUNITY): Payer: BC Managed Care – PPO | Admitting: Licensed Clinical Social Worker

## 2023-05-19 DIAGNOSIS — F332 Major depressive disorder, recurrent severe without psychotic features: Secondary | ICD-10-CM

## 2023-05-19 DIAGNOSIS — R4589 Other symptoms and signs involving emotional state: Secondary | ICD-10-CM

## 2023-05-19 DIAGNOSIS — F411 Generalized anxiety disorder: Secondary | ICD-10-CM

## 2023-05-20 ENCOUNTER — Other Ambulatory Visit (HOSPITAL_COMMUNITY): Payer: BC Managed Care – PPO

## 2023-05-20 ENCOUNTER — Encounter (HOSPITAL_COMMUNITY): Payer: Self-pay

## 2023-05-20 ENCOUNTER — Other Ambulatory Visit (HOSPITAL_COMMUNITY): Payer: BC Managed Care – PPO | Admitting: Licensed Clinical Social Worker

## 2023-05-20 DIAGNOSIS — F332 Major depressive disorder, recurrent severe without psychotic features: Secondary | ICD-10-CM | POA: Diagnosis not present

## 2023-05-20 DIAGNOSIS — R4589 Other symptoms and signs involving emotional state: Secondary | ICD-10-CM

## 2023-05-20 DIAGNOSIS — F411 Generalized anxiety disorder: Secondary | ICD-10-CM

## 2023-05-20 NOTE — Progress Notes (Unsigned)
Met with patient through virtual session today as she presented with normal affect, depressed mood, and denied any current suicidal or homicidal ideations, no plan, intent or means to want to harm self or others.  Patient stated she started PHP a few weeks prior after an incident where she cut herself at work, was send to the ER and then to the [facility] for several days after the incident.  Patient stated she had had thoughts to harm self prior to  this date but that thsi was the only time in her history she actually cut or tried to harm herself.  Patient stated no current suicidal or homicidal ideations, plan, intent or means to want to harm self or others.  Patient stated the incident occurred after her ex had put something on Facebook that shared too much with her work family as he use to work in the same place but now was no longer there and knew it would be a lot for her to deal with in being shared with those she worked. Patient acknowledged she was nervous about restarting work later and agreed to work with PHP therapists to prepare for those first few days of returning to work and how she could use learned skills to manage.  Patient rated her current level of depression a 4 and anxiety a 4 on a scale of 0-10 with 0 being none and 10 the worst.  Patient reviewed all medications with this nurse and denied any issues.  Patient stated she had not tried the increase in Trazodone to 200 mg at bedtime yet but planned to do this over the weekend to help with sleeping longer.  Patient agreed to keep this nurse or PHP staff informed if any issues with change.  Patient admitted she as a little down today as it would take until the coming week before she finally gets another pay-check from her short-term disability and has not gotten any checks since she had the incident to harm herself and left work several week back.  Patient stated she had been staying with her father at this time who she states is  supportive.  States plan to move out into her own apartment in the coming week and is looking forward to this.  Patient reported no problems at this time and agreed to let this nurse or PHP staff know if any issues or worsening of symptoms. Patient stated feeling PHP has been helpful with planning and her transition to moving out on her own.

## 2023-05-20 NOTE — Psych (Signed)
Virtual Visit via Video Note  I connected with Delisia Makarewicz on 05/03/23 at  9:00 AM EST by a video enabled telemedicine application and verified that I am speaking with the correct person using two identifiers.  Location: Patient: patient home Provider: clinical home office   I discussed the limitations of evaluation and management by telemedicine and the availability of in person appointments. The patient expressed understanding and agreed to proceed.  I discussed the assessment and treatment plan with the patient. The patient was provided an opportunity to ask questions and all were answered. The patient agreed with the plan and demonstrated an understanding of the instructions.   The patient was advised to call back or seek an in-person evaluation if the symptoms worsen or if the condition fails to improve as anticipated.  Pt was provided 240 minutes of non-face-to-face time during this encounter.   Donia Guiles, LCSW   [facility] PHP THERAPIST PROGRESS NOTE  Rosalynne Pryer [id number]  Session Time: 9:00 - 10:00  Participation Level: Active  Behavioral Response: CasualAlertDepressed  Type of Therapy: Group Therapy  Treatment Goals addressed: Coping  Progress Towards Goals: Initial  Interventions: CBT, DBT, Supportive, and Reframing  Summary: Keitlyn Bonner is a 32 y.o. female who presents with depression and anxiety symptoms.  Clinician led check-in regarding current stressors and situation, and review of patient completed daily inventory. Clinician utilized active listening and empathetic response and validated patient emotions. Clinician facilitated processing group on pertinent issues.?    Therapist Response:  Patient arrived within time allowed. Patient rates her mood at a 4 on a scale of 1-10 with 10 being best. Pt states she feels "down/sad." Pt states she slept 6 broken hours and ate 1x. Pt reports decreased appetite and decreased sleep due to being on the couch  currently.  Pt reports struggling with anger, confusion, and resentment in the relationship with her husband. Pt states she struggling with keeping up with her online college classes. Patient able to process. Patient engaged in discussion.          Session Time: 10:00 am - 11:00 am   Participation Level: Active   Behavioral Response: CasualAlertDepressed   Type of Therapy: Group Therapy   Treatment Goals addressed: Coping   Progress Towards Goals: Progressing   Interventions: CBT, DBT, Solution Focused, Strength-based, Supportive, and Reframing   Therapist Response: Cln continued topic of DBT distress tolerance skills. Cln introduced Self-Soothe skills. Group discussed ways they can utilize the five senses to soothe themselves when struggling.    Therapist Response:  Pt engaged in discussion and identifies way to utilize the skill.           Session Time: 11:00 -12:00   Participation Level: Active   Behavioral Response: CasualAlertDepressed   Type of Therapy: Group Therapy   Treatment Goals addressed: Coping   Progress Towards Goals: Progressing   Interventions: CBT, DBT, Solution Focused, Strength-based, Supportive, and Reframing   Summary: Cln continued topic of boundaries. Cln discussed the different ways boundaries present: physical, emotional, intellectual, sexual, material, and time. Group talked about the ways in which each type presents for them and is a struggle.    Therapist Response: Pt engaged in discussion and identified struggles with each type.           Session Time: 12:00 -1:00   Participation Level: Active   Behavioral Response: CasualAlertDepressed   Type of Therapy: Group therapy   Treatment Goals addressed: Coping   Progress Towards Goals: Progressing  Interventions: OT group   Summary: 12:00 - 12:50: Occupational Therapy group with cln E. Hollan.  12:50 - 1:00 Clinician assessed for immediate needs, medication compliance and  efficacy, and safety concerns.   Therapist Response: 12:00 - 12:50: See note 12:50 - 1:00 pm: At check-out, patient reports no immediate concerns. Patient demonstrates progress as evidenced by participating in first group session. Patient denies SI/HI/self-harm thoughts at the end of group.    Suicidal/Homicidal: Nowithout intent/plan  Plan: Pt will continue in PHP while working to decrease depression and anxiety symptoms, increase emotion regulation, and increase ability to manage symptoms in a healthy manner.   Collaboration of Care: Medication Management AEB C Cosby  Patient/Guardian was advised Release of Information must be obtained prior to any record release in order to collaborate their care with an outside provider. Patient/Guardian was advised if they have not already done so to contact the registration department to sign all necessary forms in order for [place] to release information regarding their care.   Consent: Patient/Guardian gives verbal consent for treatment and assignment of benefits for services provided during this visit. Patient/Guardian expressed understanding and agreed to proceed.   Diagnosis: Severe episode of recurrent major depressive disorder, without psychotic features (HCC) [F33.2]    1. Severe episode of recurrent major depressive disorder, without psychotic features (HCC)   2. GAD (generalized anxiety disorder)      Donia Guiles, LCSW

## 2023-05-20 NOTE — Therapy (Signed)
[facility] PARTIAL HOSPITALIZATION PROGRAM [address] [place], [place], [address] Phone: [phone]   Fax:  [phone]  Occupational Therapy Treatment Virtual Visit via Video Note  I connected with [name] on 05/20/23 at  8:00 AM EST by a video enabled telemedicine application and verified that I am speaking with the correct person using two identifiers.  Location: Patient: home Provider: office   I discussed the limitations of evaluation and management by telemedicine and the availability of in person appointments. The patient expressed understanding and agreed to proceed.    The patient was advised to call back or seek an in-person evaluation if the symptoms worsen or if the condition fails to improve as anticipated.  I provided 55 minutes of non-face-to-face time during this encounter.  Patient Details  Name: Shannon David MRN: [id number] Date of Birth: 1990-07-02 No data recorded  Encounter Date: 05/17/2023   OT End of Session - 05/20/23 0952     Visit Number 7    Number of Visits 20    Date for OT Re-Evaluation 06/05/23    OT Start Time 1200    OT Stop Time 1255    OT Time Calculation (min) 55 min             Past Medical History:  Diagnosis Date   Anxiety    COVID    History of Clostridium difficile infection 01/2023   History of hypothyroidism    History of PCOS    Major depressive disorder, recurrent severe without psychotic features (HCC)    Passive suicidal ideations 11/11/2022   Rheumatoid arthritis (HCC)    Suicidal behavior with attempted self-injury (HCC) 04/22/2023    Past Surgical History:  Procedure Laterality Date   BUNIONECTOMY Right    FOOT SURGERY Right 08/24/2021    There were no vitals filed for this visit.   Subjective Assessment - 05/20/23 0952     Currently in Pain? No/denies    Pain Score 0-No pain                Group Session:  S: Doing better today.   O:  In this  communication group therapy session, facilitated by an occupational therapist, participants explored several key subtopics aimed at enhancing their interpersonal skills. The session began with a discussion on the use of "I" and "AND" statements, emphasizing personal responsibility and constructive language in expressing feelings and needs. Participants then practiced active listening techniques to improve their ability to fully understand and respond to others. The group also delved into assertive communication, learning how to express themselves confidently and respectfully. Emotional regulation skills were addressed, providing strategies for managing emotions during interactions. Social skills training included role-playing scenarios to build confidence in various social contexts. Feedback and reflection were integral parts of the session, with participants offering and receiving constructive feedback to foster personal growth. The group identified common barriers to effective communication, such as anxiety, fear of judgment, and past negative experiences, and discussed strategies to overcome these challenges, including mindfulness practices and building a supportive network.   A: The patient actively participated in the group therapy session, demonstrating a high level of engagement and enthusiasm. They contributed to discussions on "I" and "AND" statements by sharing personal examples and insights. During the active listening exercises, the patient was attentive and provided thoughtful feedback to peers. They effectively practiced assertive communication techniques and showed a clear understanding of emotional regulation strategies. In social skills role-playing, the patient was confident  and responsive, indicating a good grasp of the concepts. The patient also engaged in the feedback and reflection segment, offering constructive comments and showing receptivity to feedback from others. Their proactive  approach and willingness to explore personal barriers to communication suggest significant progress and motivation to improve interpersonal skills.    P: Continue to attend PHP OT group sessions 5x week for 4 weeks to promote daily structure, social engagement, and opportunities to develop and utilize adaptive strategies to maximize functional performance in preparation for safe transition and integration back into school, work, and the community. Plan to address topic of pt 3 in next OT group session.                   OT Education - 05/20/23 0952     Education Details communication 2              OT Short Term Goals - 05/06/23 0943       OT SHORT TERM GOAL #1   Title Patient will be educated on strategies to improve psychosocial skills needed to participate fully in all daily, work, and leisure activities.    Time 4    Period Weeks    Status On-going    Target Date 06/05/23      OT SHORT TERM GOAL #2   Title Pt will apply psychosocial skills and coping mechanisms to daily activities in order to function independently and reintegrate into community dwelling    Status On-going      OT SHORT TERM GOAL #3   Title Pt will choose and/or engage in 1-3 socially engaging leisure activities to improve social participation skills upon reintegrating into community    Status On-going                      Plan - 05/20/23 0952     Psychosocial Skills Coping Strategies;Habits;Interpersonal Interaction;Routines and Behaviors             Patient will benefit from skilled therapeutic intervention in order to improve the following deficits and impairments:       Psychosocial Skills: Coping Strategies, Habits, Interpersonal Interaction, Routines and Behaviors   Visit Diagnosis: Difficulty coping    Problem List Patient Active Problem List   Diagnosis Date Noted   Severe recurrent major depression without psychotic features (HCC) 05/06/2023   GAD  (generalized anxiety disorder) 04/22/2023   MDD (major depressive disorder), recurrent episode, moderate (HCC) 04/21/2023   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 [place], [place], [address] Phone: [phone]   Fax:  [phone]  Name: Shannon David MRN: [id number] Date of Birth: [birth year]

## 2023-05-21 NOTE — Psych (Signed)
Virtual Visit via Video Note  I connected with [name] on 05/10/23 at  9:00 AM EST by a video enabled telemedicine application and verified that I am speaking with the correct person using two identifiers.  Location: Patient: patient home Provider: clinical home office   I discussed the limitations of evaluation and management by telemedicine and the availability of in person appointments. The patient expressed understanding and agreed to proceed.  I discussed the assessment and treatment plan with the patient. The patient was provided an opportunity to ask questions and all were answered. The patient agreed with the plan and demonstrated an understanding of the instructions.   The patient was advised to call back or seek an in-person evaluation if the symptoms worsen or if the condition fails to improve as anticipated.  Pt was provided 240 minutes of non-face-to-face time during this encounter.   Shannon Guiles, LCSW   [facility] PHP THERAPIST PROGRESS NOTE  Shannon David [id number]  Session Time: 9:00 - 10:00  Participation Level: Active  Behavioral Response: CasualAlertDepressed  Type of Therapy: Group Therapy  Treatment Goals addressed: Coping  Progress Towards Goals: Progressing  Interventions: CBT, DBT, Supportive, and Reframing  Summary: Shannon David is a 32 y.o. female who presents with depression and anxiety symptoms.  Clinician led check-in regarding current stressors and situation, and review of patient completed daily inventory. Clinician utilized active listening and empathetic response and validated patient emotions. Clinician facilitated processing group on pertinent issues.?    Therapist Response:  Patient arrived within time allowed. Patient rates her mood at a 3 on a scale of 1-10 with 10 being best. Pt states she feels "not good." Pt states she slept 5 broken hours and ate 1x. Pt reports she was up til 1am crying due to a friend group issue. Pt is  able to process her concerns re: the struggle. Pt states difficulty with emotion regulation and rebounding after big feelings. Patient able to process. Patient engaged in discussion.          Session Time: 10:00 am - 11:00 am   Participation Level: Active   Behavioral Response: CasualAlertDepressed   Type of Therapy: Group Therapy   Treatment Goals addressed: Coping   Progress Towards Goals: Progressing   Interventions: CBT, DBT, Solution Focused, Strength-based, Supportive, and Reframing   Therapist Response: Cln continued topic of boundaries and led a "boundary workshop" in which group members brought current boundary issues and group worked together to apply boundary concepts to help address the concern. Cln helped shape conversation to maintain fidelity.    Therapist Response: Pt engaged in discussion and shared a current boundary issue and reports gaining insight.            Session Time: 11:00 -12:00   Participation Level: Active   Behavioral Response: CasualAlertDepressed   Type of Therapy: Group Therapy   Treatment Goals addressed: Coping   Progress Towards Goals: Progressing   Interventions: CBT, DBT, Solution Focused, Strength-based, Supportive, and Reframing   Summary: Cln introduced DBT distress tolerance distraction skills. Cln provides context for distraction skills and why distraction is a foundational way to manage mood dysregulation. Group discussed how to apply distraction.    Therapist Response: Pt engaged in discussion and reports understanding of how distraction can be applied to manage big feelings.            Session Time: 12:00 -1:00   Participation Level: Active   Behavioral Response: CasualAlertDepressed   Type of Therapy: Group therapy  Treatment Goals addressed: Coping   Progress Towards Goals: Progressing   Interventions: OT group   Summary: 12:00 - 12:50: Occupational Therapy group with cln E. Hollan.  12:50 - 1:00  Clinician assessed for immediate needs, medication compliance and efficacy, and safety concerns.   Therapist Response: 12:00 - 12:50: See note 12:50 - 1:00 pm: At check-out, patient reports no immediate concerns. Patient demonstrates progress as evidenced by continued engagement and responsiveness to treatment. Patient denies SI/HI/self-harm thoughts at the end of group.    Suicidal/Homicidal: Nowithout intent/plan  Plan: Pt will continue in PHP while working to decrease depression and anxiety symptoms, increase emotion regulation, and increase ability to manage symptoms in a healthy manner.   Collaboration of Care: Medication Management AEB C Cosby  Patient/Guardian was advised Release of Information must be obtained prior to any record release in order to collaborate their care with an outside provider. Patient/Guardian was advised if they have not already done so to contact the registration department to sign all necessary forms in order for [place] to release information regarding their care.   Consent: Patient/Guardian gives verbal consent for treatment and assignment of benefits for services provided during this visit. Patient/Guardian expressed understanding and agreed to proceed.   Diagnosis: Severe recurrent major depression without psychotic features (HCC) [F33.2]    1. Severe recurrent major depression without psychotic features (HCC)   2. GAD (generalized anxiety disorder)      Shannon Guiles, LCSW

## 2023-05-21 NOTE — Psych (Signed)
Virtual Visit via Video Note  I connected with Shannon David on 05/11/23 at  9:00 AM EST by a video enabled telemedicine application and verified that I am speaking with the correct person using two identifiers.  Location: Patient: patient home Provider: clinical home office   I discussed the limitations of evaluation and management by telemedicine and the availability of in person appointments. The patient expressed understanding and agreed to proceed.  I discussed the assessment and treatment plan with the patient. The patient was provided an opportunity to ask questions and all were answered. The patient agreed with the plan and demonstrated an understanding of the instructions.   The patient was advised to call back or seek an in-person evaluation if the symptoms worsen or if the condition fails to improve as anticipated.  Pt was provided 240 minutes of non-face-to-face time during this encounter.   Donia Guiles, LCSW   [facility] PHP THERAPIST PROGRESS NOTE  Shannon David [id number]  Session Time: 9:00 - 10:00  Participation Level: Active  Behavioral Response: CasualAlertDepressed  Type of Therapy: Group Therapy  Treatment Goals addressed: Coping  Progress Towards Goals: Progressing  Interventions: CBT, DBT, Supportive, and Reframing  Summary: Shannon David is a 32 y.o. female who presents with depression and anxiety symptoms.  Clinician led check-in regarding current stressors and situation, and review of patient completed daily inventory. Clinician utilized active listening and empathetic response and validated patient emotions. Clinician facilitated processing group on pertinent issues.?    Therapist Response:  Patient arrived within time allowed. Patient rates her mood at a 7 on a scale of 1-10 with 10 being best. Pt states she feels "positive." Pt states she slept 7.5 broken hours and ate 1x. Pt reports she set a boundary with a friend and it went well. Pt  states she achieved her goal of leaving the house. Patient able to process. Patient engaged in discussion.          Session Time: 10:00 am - 11:00 am   Participation Level: Active   Behavioral Response: CasualAlertDepressed   Type of Therapy: Group Therapy   Treatment Goals addressed: Coping   Progress Towards Goals: Progressing   Interventions: CBT, DBT, Solution Focused, Strength-based, Supportive, and Reframing   Therapist Response: Cln led processing group for pt's current struggles. Group members shared stressors and provided support and feedback. Cln brought in topics of boundaries, healthy relationships, and unhealthy thought processes to inform discussion.    Therapist Response:  Pt able to process and provide support to group.            Session Time: 11:00 -12:00   Participation Level: Active   Behavioral Response: CasualAlertDepressed   Type of Therapy: Group Therapy   Treatment Goals addressed: Coping   Progress Towards Goals: Progressing   Interventions: Strength-based, Supportive, and Reframing   Summary: Chaplaincy group with K. Claussen   Therapist Response: Pt participated and engaged in discussion.             Session Time: 12:00 -1:00   Participation Level: Active   Behavioral Response: CasualAlertDepressed   Type of Therapy: Group therapy   Treatment Goals addressed: Coping   Progress Towards Goals: Progressing   Interventions: OT group   Summary: 12:00 - 12:50: Occupational Therapy group with cln E. Hollan.  12:50 - 1:00 Clinician assessed for immediate needs, medication compliance and efficacy, and safety concerns.   Therapist Response: 12:00 - 12:50: See note 12:50 - 1:00 pm: At check-out, patient reports no  immediate concerns. Patient demonstrates progress as evidenced by continued engagement and responsiveness to treatment. Patient denies SI/HI/self-harm thoughts at the end of group.    Suicidal/Homicidal: Nowithout  intent/plan  Plan: Pt will continue in PHP while working to decrease depression and anxiety symptoms, increase emotion regulation, and increase ability to manage symptoms in a healthy manner.   Collaboration of Care: Medication Management AEB C Cosby  Patient/Guardian was advised Release of Information must be obtained prior to any record release in order to collaborate their care with an outside provider. Patient/Guardian was advised if they have not already done so to contact the registration department to sign all necessary forms in order for [place] to release information regarding their care.   Consent: Patient/Guardian gives verbal consent for treatment and assignment of benefits for services provided during this visit. Patient/Guardian expressed understanding and agreed to proceed.   Diagnosis: Severe recurrent major depression without psychotic features (HCC) [F33.2]    1. Severe recurrent major depression without psychotic features (HCC)   2. Difficulty coping   3. High risk medication use      Donia Guiles, LCSW

## 2023-05-21 NOTE — Psych (Signed)
Virtual Visit via Video Note  I connected with Temara Bannan on 05/04/23 at  9:00 AM EST by a video enabled telemedicine application and verified that I am speaking with the correct person using two identifiers.  Location: Patient: patient home Provider: clinical home office   I discussed the limitations of evaluation and management by telemedicine and the availability of in person appointments. The patient expressed understanding and agreed to proceed.  I discussed the assessment and treatment plan with the patient. The patient was provided an opportunity to ask questions and all were answered. The patient agreed with the plan and demonstrated an understanding of the instructions.   The patient was advised to call back or seek an in-person evaluation if the symptoms worsen or if the condition fails to improve as anticipated.  Pt was provided 240 minutes of non-face-to-face time during this encounter.   Donia Guiles, LCSW   [facility] PHP THERAPIST PROGRESS NOTE  Caidence Deas [id number]  Session Time: 9:00 - 10:00  Participation Level: Active  Behavioral Response: CasualAlertDepressed  Type of Therapy: Group Therapy  Treatment Goals addressed: Coping  Progress Towards Goals: Initial  Interventions: CBT, DBT, Supportive, and Reframing  Summary: Josefine Tuckey is a 32 y.o. female who presents with depression and anxiety symptoms.  Clinician led check-in regarding current stressors and situation, and review of patient completed daily inventory. Clinician utilized active listening and empathetic response and validated patient emotions. Clinician facilitated processing group on pertinent issues.?    Therapist Response:  Patient arrived within time allowed. Patient rates her mood at a 3 on a scale of 1-10 with 10 being best. Pt states she feels "down/sad." Pt states she slept 8.5 broken hours and ate 1x. Pt reports after group she napped and saw her mom. Pt identifies increased  anxiety currently after receiving a call from a friend that stated feeling suicidal and then stopped answering his phone. Cln talked pt through options and discussed control.  Patient able to process. Patient engaged in discussion.          Session Time: 10:00 am - 11:00 am   Participation Level: Active   Behavioral Response: CasualAlertDepressed   Type of Therapy: Group Therapy   Treatment Goals addressed: Coping   Progress Towards Goals: Progressing   Interventions: CBT, DBT, Solution Focused, Strength-based, Supportive, and Reframing   Therapist Response: Cln led processing group for pt's current struggles. Group members shared stressors and provided support and feedback. Cln brought in topics of boundaries, healthy relationships, and unhealthy thought processes to inform discussion.    Therapist Response:  Pt able to process and provide support to group.            Session Time: 11:00 -12:00   Participation Level: Active   Behavioral Response: CasualAlertDepressed   Type of Therapy: Group Therapy   Treatment Goals addressed: Coping   Progress Towards Goals: Progressing   Interventions: Strength-based, Supportive, and Reframing   Summary: Chaplaincy group with K. Claussen   Therapist Response: Pt participated and engaged in discussion.            Session Time: 12:00 -1:00   Participation Level: Active   Behavioral Response: CasualAlertDepressed   Type of Therapy: Group therapy   Treatment Goals addressed: Coping   Progress Towards Goals: Progressing   Interventions: OT group   Summary: 12:00 - 12:50: Occupational Therapy group with cln E. Hollan.  12:50 - 1:00 Clinician assessed for immediate needs, medication compliance and efficacy, and safety concerns.  Therapist Response: 12:00 - 12:50: See note 12:50 - 1:00 pm: At check-out, patient reports no immediate concerns. Patient demonstrates progress as evidenced by continued engagement and  responsiveness to treatment. Patient denies SI/HI/self-harm thoughts at the end of group.    Suicidal/Homicidal: Nowithout intent/plan  Plan: Pt will continue in PHP while working to decrease depression and anxiety symptoms, increase emotion regulation, and increase ability to manage symptoms in a healthy manner.   Collaboration of Care: Medication Management AEB C Cosby  Patient/Guardian was advised Release of Information must be obtained prior to any record release in order to collaborate their care with an outside provider. Patient/Guardian was advised if they have not already done so to contact the registration department to sign all necessary forms in order for [place] to release information regarding their care.   Consent: Patient/Guardian gives verbal consent for treatment and assignment of benefits for services provided during this visit. Patient/Guardian expressed understanding and agreed to proceed.   Diagnosis: Severe episode of recurrent major depressive disorder, without psychotic features (HCC) [F33.2]    1. Severe episode of recurrent major depressive disorder, without psychotic features (HCC)   2. GAD (generalized anxiety disorder)   3. Rheumatoid arthritis involving multiple sites with positive rheumatoid factor (HCC)      Donia Guiles, LCSW

## 2023-05-22 NOTE — Psych (Signed)
Virtual Visit via Video Note  I connected with Shannon David on 05/17/23 at  9:00 AM EST by a video enabled telemedicine application and verified that I am speaking with the correct person using two identifiers.  Location: Patient: patient home Provider: clinical home office   I discussed the limitations of evaluation and management by telemedicine and the availability of in person appointments. The patient expressed understanding and agreed to proceed.  I discussed the assessment and treatment plan with the patient. The patient was provided an opportunity to ask questions and all were answered. The patient agreed with the plan and demonstrated an understanding of the instructions.   The patient was advised to call back or seek an in-person evaluation if the symptoms worsen or if the condition fails to improve as anticipated.  Pt was provided 240 minutes of non-face-to-face time during this encounter.   Donia Guiles, LCSW   [facility] PHP THERAPIST PROGRESS NOTE  Shannon David [id number]  Session Time: 9:00 - 10:00  Participation Level: Active  Behavioral Response: CasualAlertDepressed  Type of Therapy: Group Therapy  Treatment Goals addressed: Coping  Progress Towards Goals: Progressing  Interventions: CBT, DBT, Supportive, and Reframing  Summary: Shannon David is a 32 y.o. female who presents with depression and anxiety symptoms.  Clinician led check-in regarding current stressors and situation, and review of patient completed daily inventory. Clinician utilized active listening and empathetic response and validated patient emotions. Clinician facilitated processing group on pertinent issues.?    Therapist Response:  Patient arrived within time allowed. Patient rates her mood at a  on a scale of 1-10 with 10 being best. Pt states she feels "not good." Pt states she slept 8 hours and ate 2x. Pt reports she wore herself out yesterday being out and about. Pt states she  visited her aunt, saw her dog, and ran errands. Pt states frustration in having a lower energy and social meter than she is sued to. Pt struggles with emotion regulation.  Patient able to process. Patient engaged in discussion.          Session Time: 10:00 am - 11:00 am   Participation Level: Active   Behavioral Response: CasualAlertDepressed   Type of Therapy: Group Therapy   Treatment Goals addressed: Coping   Progress Towards Goals: Progressing   Interventions: CBT, DBT, Solution Focused, Strength-based, Supportive, and Reframing   Therapist Response: Cln led discussion on extending grace and kindness to ourselves. Group discussed the messages they give themselves and how it impacts them.    Therapist Response: Pt engaged in discussion and shares difficulties with being kind to themself.            Session Time: 11:00 -12:00   Participation Level: Active   Behavioral Response: CasualAlertDepressed   Type of Therapy: Group Therapy   Treatment Goals addressed: Coping   Progress Towards Goals: Progressing   Interventions: CBT, DBT, Solution Focused, Strength-based, Supportive, and Reframing   Summary: Cln introduced DBT emotion regulation skill, PLEASE. Cln discussed the importance of taking care of our whole body as a way to aid in stabilizing emotions. Group discussed ways they struggle to manage the elements of PLEASE and how to remove barriers.     Therapist Response: Pt engaged in discussion and is able to identify ways to utilize PLEASE skill.             Session Time: 12:00 -1:00   Participation Level: Active   Behavioral Response: CasualAlertDepressed   Type of Therapy: Group  therapy   Treatment Goals addressed: Coping   Progress Towards Goals: Progressing   Interventions: OT group   Summary: 12:00 - 12:50: Occupational Therapy group with cln E. Hollan.  12:50 - 1:00 Clinician assessed for immediate needs, medication compliance and efficacy,  and safety concerns.   Therapist Response: 12:00 - 12:50: See note 12:50 - 1:00 pm: At check-out, patient reports no immediate concerns. Patient demonstrates progress as evidenced by continued engagement and responsiveness to treatment. Patient denies SI/HI/self-harm thoughts at the end of group.    Suicidal/Homicidal: Nowithout intent/plan  Plan: Pt will continue in PHP while working to decrease depression and anxiety symptoms, increase emotion regulation, and increase ability to manage symptoms in a healthy manner.   Collaboration of Care: Medication Management AEB C Cosby  Patient/Guardian was advised Release of Information must be obtained prior to any record release in order to collaborate their care with an outside provider. Patient/Guardian was advised if they have not already done so to contact the registration department to sign all necessary forms in order for [place] to release information regarding their care.   Consent: Patient/Guardian gives verbal consent for treatment and assignment of benefits for services provided during this visit. Patient/Guardian expressed understanding and agreed to proceed.   Diagnosis: Severe episode of recurrent major depressive disorder, without psychotic features (HCC) [F33.2]    1. Severe episode of recurrent major depressive disorder, without psychotic features (HCC)   2. GAD (generalized anxiety disorder)      Donia Guiles, LCSW

## 2023-05-22 NOTE — Psych (Signed)
Virtual Visit via Video Note  I connected with Shannon David on 05/13/23 at  9:00 AM EST by a video enabled telemedicine application and verified that I am speaking with the correct person using two identifiers.  Location: Shannon David: Shannon David home Provider: clinical home office   I discussed the limitations of evaluation and management by telemedicine and the availability of in person appointments. The Shannon David expressed understanding and agreed to proceed.  I discussed the assessment and treatment plan with the Shannon David. The Shannon David was provided an opportunity to ask questions and all were answered. The Shannon David agreed with the plan and demonstrated an understanding of the instructions.   The Shannon David was advised to call back or seek an in-person evaluation if the symptoms worsen or if the condition fails to improve as anticipated.  Pt was provided 240 minutes of non-face-to-face time during this encounter.   Donia Guiles, LCSW   [facility] PHP THERAPIST PROGRESS NOTE  Keuna Janney [id number]  Session Time: 9:00 - 10:00  Participation Level: Active  Behavioral Response: CasualAlertDepressed  Type of Therapy: Group Therapy  Treatment Goals addressed: Coping  Progress Towards Goals: Progressing  Interventions: CBT, DBT, Supportive, and Reframing  Summary: Shannon David is a 32 y.o. female who presents with depression and anxiety symptoms.  Clinician led check-in regarding current stressors and situation, and review of Shannon David completed daily inventory. Clinician utilized active listening and empathetic response and validated Shannon David emotions. Clinician facilitated processing group on pertinent issues.?    Therapist Response:  Shannon David arrived within time allowed. Shannon David rates her mood at a 8 on a scale of 1-10 with 10 being best. Pt states she feels "good." Pt states she slept 7.5 broken hours and ate 2x. Pt reports yesterday was "up/down" however she ended the evening painting  which she enjoyed and she woke up without snoozing her alarm which she feels proud of. Pt reports increased anxiety yesterday around an appointment and crying a lot due to having to share her current situation with her PCP. Pt states she also found out she is able to move into her new apartment at the end of the month and she is feeling "hopeful." Pt struggles with emotion regulation.  Shannon David able to process. Shannon David engaged in discussion.          Session Time: 10:00 am - 11:00 am   Participation Level: Active   Behavioral Response: CasualAlertDepressed   Type of Therapy: Group Therapy   Treatment Goals addressed: Coping   Progress Towards Goals: Progressing   Interventions: CBT, DBT, Solution Focused, Strength-based, Supportive, and Reframing   Therapist Response: Cln continued topic of DBT distress tolerance skills and the ACCEPTS distraction skill. Group reviewed E-P-S skills and discussed how they can practice them in their every day life.    Therapist Response: Pt engaged in discussion and is able to state ways to apply these skills.            Session Time: 11:00 -12:00   Participation Level: Active   Behavioral Response: CasualAlertDepressed   Type of Therapy: Group Therapy   Treatment Goals addressed: Coping   Progress Towards Goals: Progressing   Interventions: CBT, DBT, Solution Focused, Strength-based, Supportive, and Reframing   Summary: Cln led discussion on ways to manage stressors and feelings over the weekend. Group members  brainstormed things to do over the weekend for multiple levels of energy, access, and moods. Cln reviewed crisis services should they be needed and provided pt's with the text crisis line, mobile  crisis, national suicide hotline, [facility] 24/7 line, and information on [facility].      Therapist Response:  Pt engaged in discussion and is able to identify 3 ideas of what to do over the weekend to keep their mind engaged.              Session Time: 12:00 -1:00   Participation Level: Active   Behavioral Response: CasualAlertDepressed   Type of Therapy: Group therapy   Treatment Goals addressed: Coping   Progress Towards Goals: Progressing   Interventions: OT group   Summary: 12:00 - 12:50: Occupational Therapy group with cln E. Hollan.  12:50 - 1:00 Clinician assessed for immediate needs, medication compliance and efficacy, and safety concerns.   Therapist Response: 12:00 - 12:50: See note 12:50 - 1:00 pm: At check-out, Shannon David reports no immediate concerns. Shannon David demonstrates progress as evidenced by continued engagement and responsiveness to treatment. Shannon David denies SI/HI/self-harm thoughts at the end of group.    Suicidal/Homicidal: Nowithout intent/plan  Plan: Pt will continue in PHP while working to decrease depression and anxiety symptoms, increase emotion regulation, and increase ability to manage symptoms in a healthy manner.   Collaboration of Care: Medication Management AEB C Cosby  Shannon David/Guardian was advised Release of Information must be obtained prior to any record release in order to collaborate their care with an outside provider. Shannon David/Guardian was advised if they have not already done so to contact the registration department to sign all necessary forms in order for [place] to release information regarding their care.   Consent: Shannon David/Guardian gives verbal consent for treatment and assignment of benefits for services provided during this visit. Shannon David/Guardian expressed understanding and agreed to proceed.   Diagnosis: Severe recurrent major depression without psychotic features (HCC) [F33.2]    1. Severe recurrent major depression without psychotic features (HCC)   2. GAD (generalized anxiety disorder)      Donia Guiles, LCSW

## 2023-05-22 NOTE — Psych (Signed)
Virtual Visit via Video Note  I connected with Kaziyah Garrow on 05/16/23 at  9:00 AM EST by a video enabled telemedicine application and verified that I am speaking with the correct person using two identifiers.  Location: Patient: patient home Provider: clinical home office   I discussed the limitations of evaluation and management by telemedicine and the availability of in person appointments. The patient expressed understanding and agreed to proceed.  I discussed the assessment and treatment plan with the patient. The patient was provided an opportunity to ask questions and all were answered. The patient agreed with the plan and demonstrated an understanding of the instructions.   The patient was advised to call back or seek an in-person evaluation if the symptoms worsen or if the condition fails to improve as anticipated.  Pt was provided 240 minutes of non-face-to-face time during this encounter.   Donia Guiles, LCSW   [facility] PHP THERAPIST PROGRESS NOTE  Birdie Clune [id number]  Session Time: 9:00 - 10:00  Participation Level: Active  Behavioral Response: CasualAlertDepressed  Type of Therapy: Group Therapy  Treatment Goals addressed: Coping  Progress Towards Goals: Progressing  Interventions: CBT, DBT, Supportive, and Reframing  Summary: Cookie Schmid is a 32 y.o. female who presents with depression and anxiety symptoms.  Clinician led check-in regarding current stressors and situation, and review of patient completed daily inventory. Clinician utilized active listening and empathetic response and validated patient emotions. Clinician facilitated processing group on pertinent issues.?    Therapist Response:  Patient arrived within time allowed. Patient rates her mood at a 8.5 on a scale of 1-10 with 10 being best. Pt states she feels "good, but anxious." Pt states she slept 9 hours and ate 1x. Pt reports her weekend went well overall, however she has been  dealing with high bp. Pt states she is trying to get into her PCP re: the issue. Pt states she spent most of the weekend spending time with her nephew. Pt states she also saw a friend and went to see a live band. Pt reports she feels like she is doing what makes her happy for the first time in a long time. Pt reports conflicted feelings re: this is happening without her husband in her life. Patient able to process. Patient engaged in discussion.          Session Time: 10:00 am - 11:00 am   Participation Level: Active   Behavioral Response: CasualAlertDepressed   Type of Therapy: Group Therapy   Treatment Goals addressed: Coping   Progress Towards Goals: Progressing   Interventions: CBT, DBT, Solution Focused, Strength-based, Supportive, and Reframing   Therapist Response: Cln led processing group for pt's current struggles. Group members shared stressors and provided support and feedback. Cln brought in topics of boundaries, healthy relationships, and unhealthy thought processes to inform discussion.    Therapist Response: Pt able to process and provide support to group.            Session Time: 11:00 -12:00   Participation Level: Active   Behavioral Response: CasualAlertDepressed   Type of Therapy: Group Therapy   Treatment Goals addressed: Coping   Progress Towards Goals: Progressing   Interventions: CBT, DBT, Solution Focused, Strength-based, Supportive, and Reframing   Summary: Cln introduced the "Thoughts" distraction skills from DBT distress tolerance skill ACCEPTS. Cln discussed how this set of distraction skills can be helpful when in situations where outside resources are limited or unavailable. Group practiced and brainstormed ways to apply the Thought  skill.     Therapist Response:  Pt engaged in discussion and determines ways to practice the skills.             Session Time: 12:00 -1:00   Participation Level: Active   Behavioral Response:  CasualAlertDepressed   Type of Therapy: Group therapy   Treatment Goals addressed: Coping   Progress Towards Goals: Progressing   Interventions: CBT, DBT, Solution Focused, Strength-based, Supportive, and Reframing   Summary: 12:00 - 12:50: Cln led discussion on personal standards and they way in which it impacts the way we view ourselves and our abilities. Group members discussed judgment, struggles, and barriers they experience in terms of personal standards. Cln brought in topics of balance, grace, and kindness. Cln proposed the "best friend test" as a way to calibrate whether we are viewing our situation with kindness or harshness.  12:50 - 1:00 Clinician assessed for immediate needs, medication compliance and efficacy, and safety concerns.   Therapist Response: 12:00 - 12:50: Pt engaged in discussion and reports willingness to utilize the best friend test.  12:50 - 1:00 pm: At check-out, patient reports no immediate concerns. Patient demonstrates progress as evidenced by continued engagement and responsiveness to treatment. Patient denies SI/HI/self-harm thoughts at the end of group.    Suicidal/Homicidal: Nowithout intent/plan  Plan: Pt will continue in PHP while working to decrease depression and anxiety symptoms, increase emotion regulation, and increase ability to manage symptoms in a healthy manner.   Collaboration of Care: Medication Management AEB C Cosby  Patient/Guardian was advised Release of Information must be obtained prior to any record release in order to collaborate their care with an outside provider. Patient/Guardian was advised if they have not already done so to contact the registration department to sign all necessary forms in order for [place] to release information regarding their care.   Consent: Patient/Guardian gives verbal consent for treatment and assignment of benefits for services provided during this visit. Patient/Guardian expressed understanding and agreed  to proceed.   Diagnosis: Severe episode of recurrent major depressive disorder, without psychotic features (HCC) [F33.2]    1. Severe episode of recurrent major depressive disorder, without psychotic features (HCC)   2. GAD (generalized anxiety disorder)      Donia Guiles, LCSW

## 2023-05-23 ENCOUNTER — Encounter (HOSPITAL_COMMUNITY): Payer: Self-pay

## 2023-05-23 ENCOUNTER — Other Ambulatory Visit (HOSPITAL_COMMUNITY): Payer: BC Managed Care – PPO

## 2023-05-23 ENCOUNTER — Other Ambulatory Visit (HOSPITAL_COMMUNITY): Payer: BC Managed Care – PPO | Admitting: Licensed Clinical Social Worker

## 2023-05-23 DIAGNOSIS — F411 Generalized anxiety disorder: Secondary | ICD-10-CM

## 2023-05-23 DIAGNOSIS — F332 Major depressive disorder, recurrent severe without psychotic features: Secondary | ICD-10-CM

## 2023-05-23 NOTE — Psych (Signed)
Virtual Visit via Video Note  I connected with Shannon David on 05/18/23 at  9:00 AM EST by a video enabled telemedicine application and verified that I am speaking with the correct person using two identifiers.  Location: Patient: patient home Provider: clinical home office   I discussed the limitations of evaluation and management by telemedicine and the availability of in person appointments. The patient expressed understanding and agreed to proceed.  I discussed the assessment and treatment plan with the patient. The patient was provided an opportunity to ask questions and all were answered. The patient agreed with the plan and demonstrated an understanding of the instructions.   The patient was advised to call back or seek an in-person evaluation if the symptoms worsen or if the condition fails to improve as anticipated.  Pt was provided 240 minutes of non-face-to-face time during this encounter.   Donia Guiles, LCSW   [facility] PHP THERAPIST PROGRESS NOTE  Shannon David [id number]  Session Time: 9:00 - 10:00  Participation Level: Active  Behavioral Response: CasualAlertDepressed  Type of Therapy: Group Therapy  Treatment Goals addressed: Coping  Progress Towards Goals: Progressing  Interventions: CBT, DBT, Supportive, and Reframing  Summary: Shannon David is a 32 y.o. female who presents with depression and anxiety symptoms.  Clinician led check-in regarding current stressors and situation, and review of patient completed daily inventory. Clinician utilized active listening and empathetic response and validated patient emotions. Clinician facilitated processing group on pertinent issues.?    Therapist Response:  Patient arrived within time allowed. Patient rates her mood at a 6 on a scale of 1-10 with 10 being best. Pt states she feels "okay." Pt states she slept 8.5 broken hours and ate 2x. Pt reports yesterday was "rough" however she slept well which helped. Pt  states she had to make calls re: her STD and move things around, and school work. Pt reports feeling frustrated that her energy is depleted quickly. Patient able to process. Patient engaged in discussion.          Session Time: 10:00 am - 11:00 am   Participation Level: Active   Behavioral Response: CasualAlertDepressed   Type of Therapy: Group Therapy   Treatment Goals addressed: Coping   Progress Towards Goals: Progressing   Interventions: CBT, DBT, Solution Focused, Strength-based, Supportive, and Reframing   Therapist Response: Cln led processing group for pt's current struggles. Group members shared stressors and provided support and feedback. Cln brought in topics of boundaries, healthy relationships, and unhealthy thought processes to inform discussion.    Therapist Response:  Pt able to process and provide support to group.            Session Time: 11:00 -12:00   Participation Level: Active   Behavioral Response: CasualAlertDepressed   Type of Therapy: Group Therapy   Treatment Goals addressed: Coping   Progress Towards Goals: Progressing   Interventions: Strength-based, Supportive, and Reframing   Summary: Chaplaincy group with K. Claussen   Therapist Response: Pt participated and engaged in discussion.             Session Time: 12:00 -1:00   Participation Level: Active   Behavioral Response: CasualAlertDepressed   Type of Therapy: Group therapy   Treatment Goals addressed: Coping   Progress Towards Goals: Progressing   Interventions: OT group   Summary: 12:00 - 12:50: Occupational Therapy group with cln E. Hollan.  12:50 - 1:00 Clinician assessed for immediate needs, medication compliance and efficacy, and safety concerns.   Therapist  Response: 12:00 - 12:50: See note 12:50 - 1:00 pm: At check-out, patient reports no immediate concerns. Patient demonstrates progress as evidenced by continued engagement and responsiveness to treatment.  Patient denies SI/HI/self-harm thoughts at the end of group.    Suicidal/Homicidal: Nowithout intent/plan  Plan: Pt will continue in PHP while working to decrease depression and anxiety symptoms, increase emotion regulation, and increase ability to manage symptoms in a healthy manner.   Collaboration of Care: Medication Management AEB C Cosby  Patient/Guardian was advised Release of Information must be obtained prior to any record release in order to collaborate their care with an outside provider. Patient/Guardian was advised if they have not already done so to contact the registration department to sign all necessary forms in order for [place] to release information regarding their care.   Consent: Patient/Guardian gives verbal consent for treatment and assignment of benefits for services provided during this visit. Patient/Guardian expressed understanding and agreed to proceed.   Diagnosis: Severe episode of recurrent major depressive disorder, without psychotic features (HCC) [F33.2]    1. Severe episode of recurrent major depressive disorder, without psychotic features (HCC)   2. GAD (generalized anxiety disorder)      Donia Guiles, LCSW

## 2023-05-23 NOTE — Therapy (Signed)
[facility] PARTIAL HOSPITALIZATION PROGRAM [address] [place], [place], [address] Phone: [phone]   Fax:  [phone]  Occupational Therapy Treatment Virtual Visit via Video Note  I connected with Shannon David on 05/23/23 at  8:00 AM EST by a video enabled telemedicine application and verified that I am speaking with the correct person using two identifiers.  Location: Patient: home Provider: office   I discussed the limitations of evaluation and management by telemedicine and the availability of in person appointments. The patient expressed understanding and agreed to proceed.    The patient was advised to call back or seek an in-person evaluation if the symptoms worsen or if the condition fails to improve as anticipated.  I provided 55 minutes of non-face-to-face time during this encounter.  Patient Details  Name: Shannon David MRN: [id number] Date of Birth: Jul 11, 1990 No data recorded  Encounter Date: 05/19/2023   OT End of Session - 05/23/23 0920     Visit Number 9    Number of Visits 20    Date for OT Re-Evaluation 06/05/23    OT Start Time 1200    OT Stop Time 1255    OT Time Calculation (min) 55 min    Activity Tolerance Patient tolerated treatment well             Past Medical History:  Diagnosis Date   Anxiety    COVID    History of Clostridium difficile infection 01/2023   History of hypothyroidism    History of PCOS    Major depressive disorder, recurrent severe without psychotic features (HCC)    Passive suicidal ideations 11/11/2022   Rheumatoid arthritis (HCC)    Suicidal behavior with attempted self-injury (HCC) 04/22/2023    Past Surgical History:  Procedure Laterality Date   BUNIONECTOMY Right    FOOT SURGERY Right 08/24/2021    There were no vitals filed for this visit.   Subjective Assessment - 05/23/23 0919     Currently in Pain? No/denies    Pain Score 0-No pain                 Group Session:  S: Doing better today.   O: In this communication group therapy session, facilitated by an occupational therapist, participants explored several key subtopics aimed at enhancing their interpersonal skills. The session began with a discussion on the use of "I" and "AND" statements, emphasizing personal responsibility and constructive language in expressing feelings and needs. Participants then practiced active listening techniques to improve their ability to fully understand and respond to others. The group also delved into assertive communication, learning how to express themselves confidently and respectfully. Emotional regulation skills were addressed, providing strategies for managing emotions during interactions. Social skills training included role-playing scenarios to build confidence in various social contexts. Feedback and reflection were integral parts of the session, with participants offering and receiving constructive feedback to foster personal growth. The group identified common barriers to effective communication, such as anxiety, fear of judgment, and past negative experiences, and discussed strategies to overcome these challenges, including mindfulness practices and building a supportive network.   A: The patient actively participated in the group therapy session, demonstrating a high level of engagement and enthusiasm. They contributed to discussions on "I" and "AND" statements by sharing personal examples and insights. During the active listening exercises, the patient was attentive and provided thoughtful feedback to peers. They effectively practiced assertive communication techniques and showed a clear understanding of emotional regulation strategies.  In social skills role-playing, the patient was confident and responsive, indicating a good grasp of the concepts. The patient also engaged in the feedback and reflection segment, offering constructive comments and  showing receptivity to feedback from others. Their proactive approach and willingness to explore personal barriers to communication suggest significant progress and motivation to improve interpersonal skills.    P: Continue to attend PHP OT group sessions 5x week for 4 weeks to promote daily structure, social engagement, and opportunities to develop and utilize adaptive strategies to maximize functional performance in preparation for safe transition and integration back into school, work, and the community. Plan to address topic of tbd in next OT group session.                   OT Education - 05/23/23 0919     Education Details Communication 4              OT Short Term Goals - 05/06/23 0943       OT SHORT TERM GOAL #1   Title Patient will be educated on strategies to improve psychosocial skills needed to participate fully in all daily, work, and leisure activities.    Time 4    Period Weeks    Status On-going    Target Date 06/05/23      OT SHORT TERM GOAL #2   Title Pt will apply psychosocial skills and coping mechanisms to daily activities in order to function independently and reintegrate into community dwelling    Status On-going      OT SHORT TERM GOAL #3   Title Pt will choose and/or engage in 1-3 socially engaging leisure activities to improve social participation skills upon reintegrating into community    Status On-going                      Plan - 05/23/23 0920     Psychosocial Skills Coping Strategies;Habits;Interpersonal Interaction;Routines and Behaviors             Patient will benefit from skilled therapeutic intervention in order to improve the following deficits and impairments:       Psychosocial Skills: Coping Strategies, Habits, Interpersonal Interaction, Routines and Behaviors   Visit Diagnosis: Difficulty coping    Problem List Patient Active Problem List   Diagnosis Date Noted   Severe recurrent major  depression without psychotic features (HCC) 05/06/2023   GAD (generalized anxiety disorder) 04/22/2023   MDD (major depressive disorder), recurrent episode, moderate (HCC) 04/21/2023   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 [place], [place], [address] Phone: [phone]   Fax:  [phone]  Name: Aniiyah Trouten MRN: [id number] Date of Birth: 10-25-1990

## 2023-05-23 NOTE — Therapy (Signed)
[facility] PARTIAL HOSPITALIZATION PROGRAM [address] [place], [place], [address] Phone: [phone]   Fax:  [phone]  Occupational Therapy Treatment Virtual Visit via Video Note  I connected with Shannon David on 05/23/23 at  8:00 AM EST by a video enabled telemedicine application and verified that I am speaking with the correct person using two identifiers.  Location: Patient: home Provider: office   I discussed the limitations of evaluation and management by telemedicine and the availability of in person appointments. The patient expressed understanding and agreed to proceed.    The patient was advised to call back or seek an in-person evaluation if the symptoms worsen or if the condition fails to improve as anticipated.  I provided 55 minutes of non-face-to-face time during this encounter.   Patient Details  Name: Shannon David MRN: [id number] Date of Birth: [birth year] No data recorded  Encounter Date: 05/18/2023   OT End of Session - 05/23/23 0857     Visit Number 8    Number of Visits 20    Date for OT Re-Evaluation 06/05/23    OT Start Time 1200    OT Stop Time 1255    OT Time Calculation (min) 55 min             Past Medical History:  Diagnosis Date   Anxiety    COVID    History of Clostridium difficile infection 01/2023   History of hypothyroidism    History of PCOS    Major depressive disorder, recurrent severe without psychotic features (HCC)    Passive suicidal ideations 11/11/2022   Rheumatoid arthritis (HCC)    Suicidal behavior with attempted self-injury (HCC) 04/22/2023    Past Surgical History:  Procedure Laterality Date   BUNIONECTOMY Right    FOOT SURGERY Right 08/24/2021    There were no vitals filed for this visit.   Subjective Assessment - 05/23/23 0857     Currently in Pain? No/denies    Pain Score 0-No pain                 Group Session:  S: Doing better today.   O: In this  communication group therapy session, facilitated by an occupational therapist, participants explored several key subtopics aimed at enhancing their interpersonal skills. The session began with a discussion on the use of "I" and "AND" statements, emphasizing personal responsibility and constructive language in expressing feelings and needs. Participants then practiced active listening techniques to improve their ability to fully understand and respond to others. The group also delved into assertive communication, learning how to express themselves confidently and respectfully. Emotional regulation skills were addressed, providing strategies for managing emotions during interactions. Social skills training included role-playing scenarios to build confidence in various social contexts. Feedback and reflection were integral parts of the session, with participants offering and receiving constructive feedback to foster personal growth. The group identified common barriers to effective communication, such as anxiety, fear of judgment, and past negative experiences, and discussed strategies to overcome these challenges, including mindfulness practices and building a supportive network.   A: The patient actively participated in the group therapy session, demonstrating a high level of engagement and enthusiasm. They contributed to discussions on "I" and "AND" statements by sharing personal examples and insights. During the active listening exercises, the patient was attentive and provided thoughtful feedback to peers. They effectively practiced assertive communication techniques and showed a clear understanding of emotional regulation strategies. In social skills role-playing, the patient was  confident and responsive, indicating a good grasp of the concepts. The patient also engaged in the feedback and reflection segment, offering constructive comments and showing receptivity to feedback from others. Their proactive  approach and willingness to explore personal barriers to communication suggest significant progress and motivation to improve interpersonal skills.    P: Continue to attend PHP OT group sessions 5x week for 4 weeks to promote daily structure, social engagement, and opportunities to develop and utilize adaptive strategies to maximize functional performance in preparation for safe transition and integration back into school, work, and the community. Plan to address topic of pt 4 in next OT group session.                  OT Education - 05/23/23 0857     Education Details Communication 3              OT Short Term Goals - 05/06/23 0943       OT SHORT TERM GOAL #1   Title Patient will be educated on strategies to improve psychosocial skills needed to participate fully in all daily, work, and leisure activities.    Time 4    Period Weeks    Status On-going    Target Date 06/05/23      OT SHORT TERM GOAL #2   Title Pt will apply psychosocial skills and coping mechanisms to daily activities in order to function independently and reintegrate into community dwelling    Status On-going      OT SHORT TERM GOAL #3   Title Pt will choose and/or engage in 1-3 socially engaging leisure activities to improve social participation skills upon reintegrating into community    Status On-going                      Plan - 05/23/23 0858     Psychosocial Skills Coping Strategies;Habits;Interpersonal Interaction;Routines and Behaviors             Patient will benefit from skilled therapeutic intervention in order to improve the following deficits and impairments:       Psychosocial Skills: Coping Strategies, Habits, Interpersonal Interaction, Routines and Behaviors   Visit Diagnosis: Difficulty coping    Problem List Patient Active Problem List   Diagnosis Date Noted   Severe recurrent major depression without psychotic features (HCC) 05/06/2023   GAD  (generalized anxiety disorder) 04/22/2023   MDD (major depressive disorder), recurrent episode, moderate (HCC) 04/21/2023   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 [place], [place], [address] Phone: [phone]   Fax:  [phone]  Name: Shannon David MRN: [id number] Date of Birth: 01-Sep-1990

## 2023-05-23 NOTE — Therapy (Signed)
[facility] PARTIAL HOSPITALIZATION PROGRAM [address] [place], [place], [address] Phone: [phone]   Fax:  [phone]  Occupational Therapy Treatment Virtual Visit via Video Note  I connected with [name] on 05/23/23 at  8:00 AM EST by a video enabled telemedicine application and verified that I am speaking with the correct person using two identifiers.  Location: Patient: home Provider: office   I discussed the limitations of evaluation and management by telemedicine and the availability of in person appointments. The patient expressed understanding and agreed to proceed.    The patient was advised to call back or seek an in-person evaluation if the symptoms worsen or if the condition fails to improve as anticipated.  I provided 55 minutes of non-face-to-face time during this encounter.   Patient Details  Name: Shannon David MRN: [id number] Date of Birth: 09-24-1990 No data recorded  Encounter Date: 05/20/2023   OT End of Session - 05/23/23 1149     Visit Number 10    Number of Visits 20    Date for OT Re-Evaluation 06/05/23    OT Start Time 1200    OT Stop Time 1255    OT Time Calculation (min) 55 min             Past Medical History:  Diagnosis Date   Anxiety    COVID    History of Clostridium difficile infection 01/2023   History of hypothyroidism    History of PCOS    Major depressive disorder, recurrent severe without psychotic features (HCC)    Passive suicidal ideations 11/11/2022   Rheumatoid arthritis (HCC)    Suicidal behavior with attempted self-injury (HCC) 04/22/2023    Past Surgical History:  Procedure Laterality Date   BUNIONECTOMY Right    FOOT SURGERY Right 08/24/2021    There were no vitals filed for this visit.   Subjective Assessment - 05/23/23 1149     Currently in Pain? No/denies    Pain Score 0-No pain               Group Session:  S: Feeling better today.   O: During  today's OT group session, the patient participated in an educational segment about the importance of goal-setting and the application of the SMART framework to enhance daily life, particularly focusing on ADLs and iADLs. The session began with five open-ended pre-session questions that facilitated group discussion and introspection about their current relationship with goals. Following the introduction and educational segment, participants engaged in brainstorming and group discussions to devise hypothetical SMART goals. The session concluded with five post-session questions to reinforce understanding and facilitate reflection. Throughout the session, there was a range of engagement levels noted among the participants.   A:  Patient demonstrated a high level of engagement throughout the session. They actively participated in discussions, sharing personal experiences related to goal setting and challenges faced. Patient was able to clearly articulate an understanding of the SMART framework and proposed personal SMART goals related to their own ADLs with minimal assistance. They expressed enthusiasm about applying what they learned to their daily routine and appeared motivated to make changes.   P: Continue to attend PHP OT group sessions 5x week for 4 weeks to promote daily structure, social engagement, and opportunities to develop and utilize adaptive strategies to maximize functional performance in preparation for safe transition and integration back into school, work, and the community. Plan to address topic of pt 2 in next OT group session.  OT Education - 05/23/23 1149     Education Details SMART Goals 1              OT Short Term Goals - 05/06/23 0943       OT SHORT TERM GOAL #1   Title Patient will be educated on strategies to improve psychosocial skills needed to participate fully in all daily, work, and leisure activities.    Time 4    Period Weeks     Status On-going    Target Date 06/05/23      OT SHORT TERM GOAL #2   Title Pt will apply psychosocial skills and coping mechanisms to daily activities in order to function independently and reintegrate into community dwelling    Status On-going      OT SHORT TERM GOAL #3   Title Pt will choose and/or engage in 1-3 socially engaging leisure activities to improve social participation skills upon reintegrating into community    Status On-going                      Plan - 05/23/23 1149     Psychosocial Skills Coping Strategies;Habits;Interpersonal Interaction;Routines and Behaviors             Patient will benefit from skilled therapeutic intervention in order to improve the following deficits and impairments:       Psychosocial Skills: Coping Strategies, Habits, Interpersonal Interaction, Routines and Behaviors   Visit Diagnosis: Difficulty coping    Problem List Patient Active Problem List   Diagnosis Date Noted   Severe recurrent major depression without psychotic features (HCC) 05/06/2023   GAD (generalized anxiety disorder) 04/22/2023   MDD (major depressive disorder), recurrent episode, moderate (HCC) 04/21/2023   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 [place], [place], [address] Phone: [phone]   Fax:  [phone]  Name: Shannon David MRN: [id number] Date of Birth: 1991/05/27

## 2023-05-24 ENCOUNTER — Other Ambulatory Visit (HOSPITAL_COMMUNITY): Payer: BC Managed Care – PPO

## 2023-05-24 ENCOUNTER — Encounter (HOSPITAL_COMMUNITY): Payer: Self-pay

## 2023-05-24 ENCOUNTER — Other Ambulatory Visit (HOSPITAL_COMMUNITY): Payer: BC Managed Care – PPO | Admitting: Licensed Clinical Social Worker

## 2023-05-24 DIAGNOSIS — F332 Major depressive disorder, recurrent severe without psychotic features: Secondary | ICD-10-CM

## 2023-05-24 DIAGNOSIS — F411 Generalized anxiety disorder: Secondary | ICD-10-CM

## 2023-05-24 DIAGNOSIS — R4589 Other symptoms and signs involving emotional state: Secondary | ICD-10-CM

## 2023-05-24 NOTE — Therapy (Signed)
[facility] PARTIAL HOSPITALIZATION PROGRAM [address] [place], [place], [address] Phone: [phone]   Fax:  [phone]  Occupational Therapy Treatment Virtual Visit via Video Note  I connected with [name] on 05/24/23 at  8:00 AM EST by a video enabled telemedicine application and verified that I am speaking with the correct person using two identifiers.  Location: Patient: home Provider: office   I discussed the limitations of evaluation and management by telemedicine and the availability of in person appointments. The patient expressed understanding and agreed to proceed.    The patient was advised to call back or seek an in-person evaluation if the symptoms worsen or if the condition fails to improve as anticipated.  I provided 55 minutes of non-face-to-face time during this encounter.   Patient Details  Name: Shannon David MRN: [id number] Date of Birth: 15-Apr-1991 No data recorded  Encounter Date: 05/24/2023   OT End of Session - 05/24/23 2211     Visit Number 11    Number of Visits 20    Date for OT Re-Evaluation 06/05/23    OT Start Time 1200    OT Stop Time 1255    OT Time Calculation (min) 55 min             Past Medical History:  Diagnosis Date   Anxiety    COVID    History of Clostridium difficile infection 01/2023   History of hypothyroidism    History of PCOS    Major depressive disorder, recurrent severe without psychotic features (HCC)    Passive suicidal ideations 11/11/2022   Rheumatoid arthritis (HCC)    Suicidal behavior with attempted self-injury (HCC) 04/22/2023    Past Surgical History:  Procedure Laterality Date   BUNIONECTOMY Right    FOOT SURGERY Right 08/24/2021    There were no vitals filed for this visit.   Subjective Assessment - 05/24/23 2211     Currently in Pain? No/denies    Pain Score 0-No pain                 Group Session:  S: Doing better today.   O: During  today's OT group session, the patient participated in an educational segment about the importance of goal-setting and the application of the SMART framework to enhance daily life, particularly focusing on ADLs and iADLs. The session began with five open-ended pre-session questions that facilitated group discussion and introspection about their current relationship with goals. Following the introduction and educational segment, participants engaged in brainstorming and group discussions to devise hypothetical SMART goals. The session concluded with five post-session questions to reinforce understanding and facilitate reflection. Throughout the session, there was a range of engagement levels noted among the participants.   A:  Patient demonstrated a high level of engagement throughout the session. They actively participated in discussions, sharing personal experiences related to goal setting and challenges faced. Patient was able to clearly articulate an understanding of the SMART framework and proposed personal SMART goals related to their own ADLs with minimal assistance. They expressed enthusiasm about applying what they learned to their daily routine and appeared motivated to make changes.  OCCUPATIONAL THERAPY DISCHARGE SUMMARY  Visits from Start of Care: 11  Current functional level related to goals / functional outcomes: Pt has met all OT goals and is ready for DC from [facility] OT at this time.    Remaining deficits: No deficits remaining related to OT goals  Plan: Patient agrees to discharge.  OT Education - 05/24/23 2211     Education Details SMART Goals              OT Short Term Goals - 05/06/23 0943       OT SHORT TERM GOAL #1   Title Patient will be educated on strategies to improve psychosocial skills needed to participate fully in all daily, work, and leisure activities.    Time 4    Period Weeks    Status On-going    Target Date  06/05/23      OT SHORT TERM GOAL #2   Title Pt will apply psychosocial skills and coping mechanisms to daily activities in order to function independently and reintegrate into community dwelling    Status On-going      OT SHORT TERM GOAL #3   Title Pt will choose and/or engage in 1-3 socially engaging leisure activities to improve social participation skills upon reintegrating into community    Status On-going                      Plan - 05/24/23 2211     Psychosocial Skills Coping Strategies;Habits;Interpersonal Interaction;Routines and Behaviors             Patient will benefit from skilled therapeutic intervention in order to improve the following deficits and impairments:       Psychosocial Skills: Coping Strategies, Habits, Interpersonal Interaction, Routines and Behaviors   Visit Diagnosis: Difficulty coping    Problem List Patient Active Problem List   Diagnosis Date Noted   Severe recurrent major depression without psychotic features (HCC) 05/06/2023   GAD (generalized anxiety disorder) 04/22/2023   MDD (major depressive disorder), recurrent episode, moderate (HCC) 04/21/2023   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   Major depressive disorder, recurrent episode, moderate degree (HCC) 11/11/2022   Pain in left knee 06/04/2021   History of PCOS 02/21/2017   High risk medication use 09/21/2016   Rheumatoid arthritis involving multiple sites with positive rheumatoid factor (HCC) 09/21/2016   History of hypothyroidism 09/21/2016   History of depression 09/21/2016    Ted Mcalpine, OT 05/24/2023, 10:12 PM  Kerrin Champagne, OT   [facility] HOSPITALIZATION PROGRAM [address] [place], [place], [address] Phone: [phone]   Fax:  [phone]  Name: Aradia Tormey MRN: [id number] Date of Birth: 1990/12/05

## 2023-05-24 NOTE — Progress Notes (Unsigned)
Virtual Visit via Video Note  I connected with Shannon David on 05/24/23 at  9:00 AM EST by a video enabled telemedicine application and verified that I am speaking with the correct person using two identifiers.  Location: Patient: Home Provider: Office   I discussed the limitations of evaluation and management by telemedicine and the availability of in person appointments. The patient expressed understanding and agreed to proceed.   I discussed the assessment and treatment plan with the patient. The patient was provided an opportunity to ask questions and all were answered. The patient agreed with the plan and demonstrated an understanding of the instructions.   The patient was advised to call back or seek an in-person evaluation if the symptoms worsen or if the condition fails to improve as anticipated.  I provided 15 minutes of non-face-to-face time during this encounter.   [name], NP   [facility] MD/PA/NP OP Progress Note  05/25/2023 9:04 AM Shannon David  MRN:  [id number]  Chief Complaint:  [name] stated " I am not having a good day"  HPI: [name] reports she is struggling with worsening depression and mood irritability today.  States she moved into a new apartment over the weekend however just found out that her job has demoted her to a different position.  She reports " I guess I missed too much work and they are retaliating against me but I have FMLA."  Reports her FMLA is related to her rheumatoid arthritis.  States she has had multiple flareups.  States she has been out of work for the better part of this previous year due to broken bone she was out for 3 months coupled with her declining mental health and multiple RA flareups.  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  She reports she has been employed by the company for the past 8 years.  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   Reports she  feels that they are retaliating against her, and if she was receiving unfair treatment.  Reports struggling with multiple crying spells states feeling worthlessness.  She is denying suicidal or homicidal ideations.  Reports she continues to take her medications as directed.  Reports she her trazodone was recently increased from 50 mg to 100 mg.  She reports she has been taking and tolerating well.  She denied any other concerns at this follow-up.  Staff to continue to monitor for safety.  Support, encouragement and reassurance was provided.  Visit Diagnosis:    ICD-10-CM   1. MDD (major depressive disorder), recurrent episode, moderate (HCC)  F33.1     2. Generalized anxiety disorder  F41.1       Past Psychiatric History: Major depressive disorder, generalized anxiety disorder.  Reported previous suicidal ideations  Past Medical History:  Past Medical History:  Diagnosis Date   Anxiety    COVID    History of Clostridium difficile infection 01/2023   History of hypothyroidism    History of PCOS    Major depressive disorder, recurrent severe without psychotic features (HCC)    Passive suicidal ideations 11/11/2022   Rheumatoid arthritis (HCC)    Suicidal behavior with attempted self-injury (HCC) 04/22/2023    Past Surgical History:  Procedure Laterality Date   BUNIONECTOMY Right    FOOT SURGERY Right 08/24/2021    Family Psychiatric History:   Family History:  Family History  Problem Relation Age of Onset  Depression Mother    Bipolar disorder Mother    Hypertension Mother    Clotting disorder Mother    Depression Brother    Rheum arthritis Brother    Lung cancer Paternal Uncle    Cancer Maternal Grandmother        lung    Social History:  Social History   Socioeconomic History   Marital status: Legally Separated    Spouse name: Not on file   Number of children: 0   Years of education: Not on file   Highest education level: High school graduate  Occupational  History   Not on file  Tobacco Use   Smoking status: Never    Passive exposure: Never   Smokeless tobacco: Never  Vaping Use   Vaping status: Never Used  Substance and Sexual Activity   Alcohol use: No   Drug use: No   Sexual activity: Not on file  Other Topics Concern   Not on file  Social History Narrative   Left Handed    Lives in a one story home   Drinks Caffeine    Social Determinants of Health   Financial Resource Strain: Not on file  Food Insecurity: Patient Declined (04/21/2023)   Hunger Vital Sign    Worried About Running Out of Food in the Last Year: Patient declined    Ran Out of Food in the Last Year: Patient declined  Transportation Needs: No Transportation Needs (04/21/2023)   PRAPARE - Administrator, Civil Service (Medical): No    Lack of Transportation (Non-Medical): No  Physical Activity: Not on file  Stress: Not on file  Social Connections: Not on file    Allergies:  Allergies  Allergen Reactions   Keflex [Cephalexin] Hives    Metabolic Disorder Labs: No results found for: "HGBA1C", "MPG" No results found for: "PROLACTIN" Lab Results  Component Value Date   CHOL 151 12/28/2022   TRIG 65.0 12/28/2022   HDL 48.50 12/28/2022   CHOLHDL 3 12/28/2022   VLDL 13.0 12/28/2022   LDLCALC 89 12/28/2022   LDLCALC 74 10/18/2022   Lab Results  Component Value Date   TSH 5.062 (H) 11/11/2022    Therapeutic Level Labs: No results found for: "LITHIUM" No results found for: "VALPROATE" No results found for: "CBMZ"  Current Medications: Current Outpatient Medications  Medication Sig Dispense Refill   albuterol (VENTOLIN HFA) 108 (90 Base) MCG/ACT inhaler Inhale 1-2 puffs into the lungs every 6 (six) hours as needed for wheezing or shortness of breath. 1 each 0   Cholecalciferol (VITAMIN D3) 50 MCG (2000 UT) capsule Take 2,000 Units by mouth daily.     folic acid (FOLVITE) 1 MG tablet Take 2 tablets (2 mg total) by mouth daily. 60 tablet  0   gabapentin (NEURONTIN) 300 MG capsule Take 1 capsule (300 mg total) by mouth 2 (two) times daily. 60 capsule 0   hydroxychloroquine (PLAQUENIL) 200 MG tablet TAKE 1 TABLET BY MOUTH TWICE A DAY MONDAY-FRIDAY 120 tablet 0   levothyroxine (SYNTHROID) 112 MCG tablet Take 1 tablet (112 mcg total) by mouth daily. 30 tablet 0   methotrexate (RHEUMATREX) 2.5 MG tablet TAKE 8 TABLETS (20 MG TOTAL) BY MOUTH ONCE A WEEK. CAUTION:CHEMOTHERAPY. PROTECT FROM LIGHT. (Patient taking differently: Take 20 mg by mouth once a week. TAKE 8 TABLETS (20 MG TOTAL) BY MOUTH ONCE A WEEK. CAUTION:CHEMOTHERAPY. PROTECT FROM LIGHT. Takes every Wednesday) 96 tablet 0   PARoxetine (PAXIL) 30 MG tablet Take 2 tablets (60  mg total) by mouth daily. 60 tablet 0   propranolol (INDERAL) 10 MG tablet Take 1 tablet (10 mg total) by mouth 2 (two) times daily. 60 tablet 0   Tocilizumab (ACTEMRA) 80 MG/4ML SOLN injection INFUSE 4MG /KG INTRAVENOUSLY EVERY 4 WEEKS 24 mL 5   traZODone (DESYREL) 100 MG tablet Take 1 tablet (100 mg total) by mouth at bedtime as needed for sleep. 30 tablet 0   No current facility-administered medications for this visit.   Facility-Administered Medications Ordered in Other Visits  Medication Dose Route Frequency Provider Last Rate Last Admin   acetaminophen (TYLENOL) tablet 650 mg  650 mg Oral Once Gearldine Bienenstock, PA-C       diphenhydrAMINE (BENADRYL) capsule 25 mg  25 mg Oral Once Gearldine Bienenstock, PA-C       tocilizumab (ACTEMRA) 4 mg/kg in sodium chloride 0.9 % 100 mL infusion  4 mg/kg Intravenous Once Gearldine Bienenstock, PA-C         Musculoskeletal: Tele- assessment   Psychiatric Specialty Exam: Review of Systems  Psychiatric/Behavioral:  Positive for decreased concentration and sleep disturbance. Suicidal ideas: denied during this assessemenmt.The patient is nervous/anxious.   All other systems reviewed and are negative.   There were no vitals taken for this visit.There is no height or weight on  file to calculate BMI.  General Appearance: Casual  Eye Contact:  Good  Speech:  Clear and Coherent  Volume:  Normal  Mood:  Anxious and Depressed  Affect:  Congruent  Thought Process:  Coherent  Orientation:  Full (Time, Place, and Person)  Thought Content: Logical   Suicidal Thoughts:  No reported experiencing suicidal ideations without plan or intent on yesterday however is currently denying.  Safety planning was completed during this evaluation.   Homicidal Thoughts:  No  Memory:  Immediate;   Good Recent;   Good  Judgement:  Fair  Insight:  Fair  Psychomotor Activity:  Normal  Concentration:  Concentration: Good  Recall:  Good  Fund of Knowledge: Good  Language: Good  Akathisia:  No  Handed:  Right  AIMS (if indicated): not done  Assets:  Communication Skills Desire for Improvement Social Support  ADL's:  Intact  Cognition: WNL  Sleep:  Fair   Screenings: AUDIT    Flowsheet Row [facility]  Alcohol Use Disorder Identification Test Final Score (AUDIT) 0      GAD-7    Flowsheet Row Counselor from 12/23/2022 in [facility]  Total GAD-7 Score 2      PHQ2-9    Flowsheet Row Counselor from 05/06/2023 [facility] Counselor from 04/28/2023 in [facility] Clinical Support from 04/27/2023 in [facility] [employer] from 12/23/2022 in [facility] Total Score 2 2 1 2   PHQ-9 Total Score 13 15 4 11       Flowsheet Row Counselor from 05/06/2023 in [facility] Counselor from 04/28/2023 in [facility]  C-SSRS RISK CATEGORY Low Risk High  Risk No Risk        Assessment and Plan:  Continue partial hospitalization programming Trazodone refilled at 100 mg p.o. nightly times 1 repeat  Collaboration of Care: Collaboration of Care: Psychiatrist AEB Lamar Sprinkles psychiatrist  Patient/Guardian was advised Release  of Information must be obtained prior to any record release in order to collaborate their care with an outside provider. Patient/Guardian was advised if they have not already done so to contact the registration department to sign all necessary forms in order for [place] to release information regarding their care.   Consent: Patient/Guardian gives verbal consent for treatment and assignment of benefits for services provided during this visit. Patient/Guardian expressed understanding and agreed to proceed.    [name], NP 05/24/2023, 9:04 AM

## 2023-05-25 ENCOUNTER — Telehealth: Payer: Self-pay | Admitting: Pharmacy Technician

## 2023-05-25 ENCOUNTER — Encounter (HOSPITAL_COMMUNITY): Payer: Self-pay

## 2023-05-25 ENCOUNTER — Other Ambulatory Visit (HOSPITAL_COMMUNITY): Payer: BC Managed Care – PPO

## 2023-05-25 ENCOUNTER — Encounter (HOSPITAL_COMMUNITY): Payer: Self-pay | Admitting: Family

## 2023-05-25 ENCOUNTER — Ambulatory Visit: Payer: BC Managed Care – PPO

## 2023-05-25 VITALS — BP 103/67 | HR 66 | Temp 98.4°F | Resp 16 | Ht 62.0 in | Wt 273.2 lb

## 2023-05-25 DIAGNOSIS — M0579 Rheumatoid arthritis with rheumatoid factor of multiple sites without organ or systems involvement: Secondary | ICD-10-CM

## 2023-05-25 MED ORDER — TOCILIZUMAB 400 MG/20ML IV SOLN
480.0000 mg | Freq: Once | INTRAVENOUS | Status: AC
  Administered 2023-05-25: 480 mg via INTRAVENOUS
  Filled 2023-05-25: qty 24

## 2023-05-25 MED ORDER — ACETAMINOPHEN 325 MG PO TABS
650.0000 mg | ORAL_TABLET | Freq: Once | ORAL | Status: AC
  Administered 2023-05-25: 650 mg via ORAL
  Filled 2023-05-25: qty 2

## 2023-05-25 MED ORDER — DIPHENHYDRAMINE HCL 25 MG PO CAPS
25.0000 mg | ORAL_CAPSULE | Freq: Once | ORAL | Status: AC
  Administered 2023-05-25: 25 mg via ORAL
  Filled 2023-05-25: qty 1

## 2023-05-25 NOTE — Progress Notes (Signed)
Diagnosis: Rheumatoid Arthritis  Provider:  Chilton Greathouse MD  Procedure: IV Infusion  IV Type: Peripheral, IV Location: L Antecubital  Actemra (Tocilizumab), Dose: 480mg   Infusion Start Time: 1023  Infusion Stop Time: 1125  Post Infusion IV Care: Peripheral IV Discontinued  Discharge: Condition: Good, Destination: Home . AVS Declined  Performed by:  Adriana Mccallum, RN

## 2023-05-25 NOTE — Psych (Signed)
Virtual Visit via Video Note  I connected with Shannon David on 05/23/23 at  9:00 AM EST by a video enabled telemedicine application and verified that I am speaking with the correct person using two identifiers.  Location: Patient: patient home Provider: clinical home office   I discussed the limitations of evaluation and management by telemedicine and the availability of in person appointments. The patient expressed understanding and agreed to proceed.  I discussed the assessment and treatment plan with the patient. The patient was provided an opportunity to ask questions and all were answered. The patient agreed with the plan and demonstrated an understanding of the instructions.   The patient was advised to call back or seek an in-person evaluation if the symptoms worsen or if the condition fails to improve as anticipated.  Pt was provided 240 minutes of non-face-to-face time during this encounter.   Shannon Guiles, LCSW   [facility] PHP THERAPIST PROGRESS NOTE  Shannon David [id number]  Session Time: 9:00 - 10:00  Participation Level: Active  Behavioral Response: CasualAlertDepressed  Type of Therapy: Group Therapy  Treatment Goals addressed: Coping  Progress Towards Goals: Progressing  Interventions: CBT, DBT, Supportive, and Reframing  Summary: Shannon David is a 32 y.o. female who presents with depression and anxiety symptoms.  Clinician led check-in regarding current stressors and situation, and review of patient completed daily inventory. Clinician utilized active listening and empathetic response and validated patient emotions. Clinician facilitated processing group on pertinent issues.?    Therapist Response:  Patient arrived within time allowed. Patient rates her mood at a 4 on a scale of 1-10 with 10 being best. Pt states she feels "not good." Pt states she slept 6 hours and ate 2x. Pt reports her weekend went well and she spent time with her nephew and went to  a thrift store. Pt shares she picked up the keys to her new apartment this morning and was feeling positive, then she got an email from work telling her she was demoted and her shift changed to day shift. Pt is crying in session and upset. Pt reports the atmosphere and people on day shift are toxic and she did not do well working with them in the past. Pt is also hurt that everything happened without it being discussed with her.  Patient able to process. Patient engaged in discussion.          Session Time: 10:00 am - 11:00 am   Participation Level: Active   Behavioral Response: CasualAlertDepressed   Type of Therapy: Group Therapy   Treatment Goals addressed: Coping   Progress Towards Goals: Progressing   Interventions: CBT, DBT, Solution Focused, Strength-based, Supportive, and Reframing   Therapist Response: Cln led processing group for pt's current struggles. Group members shared stressors and provided support and feedback. Cln brought in topics of boundaries, healthy relationships, and unhealthy thought processes to inform discussion.    Therapist Response: Pt able to process and provide support to group.            Session Time: 11:00 -12:00   Participation Level: Active   Behavioral Response: CasualAlertDepressed   Type of Therapy: Group Therapy   Treatment Goals addressed: Coping   Progress Towards Goals: Progressing   Interventions: CBT, DBT, Solution Focused, Strength-based, Supportive, and Reframing   Summary: Cln led discussion on rest. Cln discussed the need to rewrite social story of rest being earned or last on the list and assert that rest is productive. Group members discussed barriers to  allowing themselves rest and the negative self-talk involved.  Cln worked with group to thought challenge and apply self-coaching strategies.   Therapist Response: Pt engaged in discussion and reports struggle with allowing themselves rest.             Session Time:  12:00 -1:00   Participation Level: Active   Behavioral Response: CasualAlertDepressed   Type of Therapy: Group therapy   Treatment Goals addressed: Coping   Progress Towards Goals: Progressing   Interventions: OT group   Summary: 12:00 - 12:50: Occupational Therapy group with cln E. Hollan.  12:50 - 1:00 Clinician assessed for immediate needs, medication compliance and efficacy, and safety concerns.   Therapist Response: 12:00 - 12:50: See note 12:50 - 1:00 pm: At check-out, patient reports no immediate concerns. Patient demonstrates progress as evidenced by continued engagement and responsiveness to treatment. Patient denies SI/HI/self-harm thoughts at the end of group.    Suicidal/Homicidal: Nowithout intent/plan  Plan: Pt will continue in PHP while working to decrease depression and anxiety symptoms, increase emotion regulation, and increase ability to manage symptoms in a healthy manner.   Collaboration of Care: Medication Management AEB C Cosby  Patient/Guardian was advised Release of Information must be obtained prior to any record release in order to collaborate their care with an outside provider. Patient/Guardian was advised if they have not already done so to contact the registration department to sign all necessary forms in order for [place] to release information regarding their care.   Consent: Patient/Guardian gives verbal consent for treatment and assignment of benefits for services provided during this visit. Patient/Guardian expressed understanding and agreed to proceed.   Diagnosis: Severe episode of recurrent major depressive disorder, without psychotic features (HCC) [F33.2]    1. Severe episode of recurrent major depressive disorder, without psychotic features (HCC)   2. GAD (generalized anxiety disorder)      Shannon Guiles, LCSW

## 2023-05-25 NOTE — Psych (Signed)
Virtual Visit via Video Note  I connected with Shannon David on 05/24/23 at  9:00 AM EST by a video enabled telemedicine application and verified that I am speaking with the correct person using two identifiers.  Location: Patient: patient home Provider: clinical home office   I discussed the limitations of evaluation and management by telemedicine and the availability of in person appointments. The patient expressed understanding and agreed to proceed.  I discussed the assessment and treatment plan with the patient. The patient was provided an opportunity to ask questions and all were answered. The patient agreed with the plan and demonstrated an understanding of the instructions.   The patient was advised to call back or seek an in-person evaluation if the symptoms worsen or if the condition fails to improve as anticipated.  Pt was provided 240 minutes of non-face-to-face time during this encounter.   Donia Guiles, LCSW   [facility] PHP THERAPIST PROGRESS NOTE  Shannon David [id number]  Session Time: 9:00 - 10:00  Participation Level: Active  Behavioral Response: CasualAlertDepressed  Type of Therapy: Group Therapy  Treatment Goals addressed: Coping  Progress Towards Goals: Progressing  Interventions: CBT, DBT, Supportive, and Reframing  Summary: Shannon David is a 32 y.o. female who presents with depression and anxiety symptoms.  Clinician led check-in regarding current stressors and situation, and review of patient completed daily inventory. Clinician utilized active listening and empathetic response and validated patient emotions. Clinician facilitated processing group on pertinent issues.?    Therapist Response:  Patient arrived within time allowed. Patient rates her mood at a 4 on a scale of 1-10 with 10 being best. Pt states she feels "not great." Pt states she slept 7 hours and ate 1x. Pt reports she is "still processing yesterday" and is feeling low. Pt reports  feeling like a failure and struggling with rumination most of the day and crying. Pt states she saw her husband and it was confusing and hurtful. Pt is struggling with low self esteem. Patient able to process. Patient engaged in discussion.          Session Time: 10:00 am - 11:00 am   Participation Level: Active   Behavioral Response: CasualAlertDepressed   Type of Therapy: Group Therapy   Treatment Goals addressed: Coping   Progress Towards Goals: Progressing   Interventions: CBT, DBT, Solution Focused, Strength-based, Supportive, and Reframing   Therapist Response: Cln led discussion on the holidays and held space for group members to process plans, feelings, and worries regarding the upcoming holiday. Cln brought in DBT ACCEPTS skills, boundaries, CBT thought challenging, and planning ahead to inform discussion when appropriate.    Therapist Response: Pt able to process and provide support to group.            Session Time: 11:00 -12:00   Participation Level: Active   Behavioral Response: CasualAlertDepressed   Type of Therapy: Group Therapy   Treatment Goals addressed: Coping   Progress Towards Goals: Progressing   Interventions: CBT, DBT, Solution Focused, Strength-based, Supportive, and Reframing   Summary: Cln led discussion on protecting our energy. Cln brought in topics of boundaries and self-esteem. Group discussed what drains them and why they engage in those activities. Cln encouraged pt's to consider what will protect their energy when making decisions.    Therapist Response: Pt engaged in discussion.            Session Time: 12:00 -1:00   Participation Level: Active   Behavioral Response: CasualAlertDepressed   Type of  Therapy: Group therapy   Treatment Goals addressed: Coping   Progress Towards Goals: Progressing   Interventions: OT group   Summary: 12:00 - 12:50: Occupational Therapy group with cln E. Hollan.  12:50 - 1:00 Clinician  assessed for immediate needs, medication compliance and efficacy, and safety concerns.   Therapist Response: 12:00 - 12:50: See note 12:50 - 1:00 pm: At check-out, patient reports no immediate concerns. Patient demonstrates progress as evidenced by continued engagement and responsiveness to treatment. Patient denies SI/HI/self-harm thoughts at the end of group.    Suicidal/Homicidal: Nowithout intent/plan  Plan: Pt will continue in PHP while working to decrease depression and anxiety symptoms, increase emotion regulation, and increase ability to manage symptoms in a healthy manner.   Collaboration of Care: Medication Management AEB C Cosby  Patient/Guardian was advised Release of Information must be obtained prior to any record release in order to collaborate their care with an outside provider. Patient/Guardian was advised if they have not already done so to contact the registration department to sign all necessary forms in order for [place] to release information regarding their care.   Consent: Patient/Guardian gives verbal consent for treatment and assignment of benefits for services provided during this visit. Patient/Guardian expressed understanding and agreed to proceed.   Diagnosis: Severe episode of recurrent major depressive disorder, without psychotic features (HCC) [F33.2]    1. Severe episode of recurrent major depressive disorder, without psychotic features (HCC)   2. Generalized anxiety disorder      Donia Guiles, LCSW

## 2023-05-25 NOTE — Psych (Signed)
Virtual Visit via Video Note  I connected with Shannon David on 05/20/23 at  9:00 AM EST by a video enabled telemedicine application and verified that I am speaking with the correct person using two identifiers.  Location: Patient: patient home Provider: clinical home office   I discussed the limitations of evaluation and management by telemedicine and the availability of in person appointments. The patient expressed understanding and agreed to proceed.  I discussed the assessment and treatment plan with the patient. The patient was provided an opportunity to ask questions and all were answered. The patient agreed with the plan and demonstrated an understanding of the instructions.   The patient was advised to call back or seek an in-person evaluation if the symptoms worsen or if the condition fails to improve as anticipated.  Pt was provided 240 minutes of non-face-to-face time during this encounter.   Shannon Guiles, LCSW   [facility] PHP THERAPIST PROGRESS NOTE  Shannon David [id number]  Session Time: 9:00 - 10:00  Participation Level: Active  Behavioral Response: CasualAlertDepressed  Type of Therapy: Group Therapy  Treatment Goals addressed: Coping  Progress Towards Goals: Progressing  Interventions: CBT, DBT, Supportive, and Reframing  Summary: Shannon David is a 32 y.o. female who presents with depression and anxiety symptoms.  Clinician led check-in regarding current stressors and situation, and review of patient completed daily inventory. Clinician utilized active listening and empathetic response and validated patient emotions. Clinician facilitated processing group on pertinent issues.?    Therapist Response:  Patient arrived within time allowed. Patient rates her mood at a 4 on a scale of 1-10 with 10 being best. Pt states she feels "like I want to cry." Pt states she slept 11 hours and ate 1x. Pt reports she had a mood dip yesterday afternoon and has felt  tearful since. Pt is unable to identify anything that may have triggered the reaction. Pt states she stayed at home yesterday. Pt cries intermittently throughout session. Patient able to process. Patient engaged in discussion.          Session Time: 10:00 am - 11:00 am   Participation Level: Active   Behavioral Response: CasualAlertDepressed   Type of Therapy: Group Therapy   Treatment Goals addressed: Coping   Progress Towards Goals: Progressing   Interventions: CBT, DBT, Solution Focused, Strength-based, Supportive, and Reframing   Therapist Response: Cln introduced topic of CBT cognitive distortions. Cln discussed unhealthy thought patterns and how our thoughts shape our reality and irrational thoughts can alter our perspective. Cln utilized handout "cognitive distortions" to discuss common examples of distorted thoughts and group members worked to identify examples in their own life.   Therapist Response: Pt engaged in discussion and is able to determine examples of distorted thinking in their own life.            Session Time: 11:00 -12:00   Participation Level: Active   Behavioral Response: CasualAlertDepressed   Type of Therapy: Group Therapy   Treatment Goals addressed: Coping   Progress Towards Goals: Progressing   Interventions: CBT, DBT, Solution Focused, Strength-based, Supportive, and Reframing   Summary: Cln led discussion on ways to manage stressors and feelings over the weekend. Group members  brainstormed things to do over the weekend for multiple levels of energy, access, and moods. Cln reviewed crisis services should they be needed and provided pt's with the text crisis line, mobile crisis, national suicide hotline, [facility] 24/7 line, and information on [facility].      Therapist  Response: Pt engaged in discussion and is able to identify 3 ideas of what to do over the weekend to keep their mind engaged.             Session Time: 12:00 -1:00    Participation Level: Active   Behavioral Response: CasualAlertDepressed   Type of Therapy: Group therapy   Treatment Goals addressed: Coping   Progress Towards Goals: Progressing   Interventions: OT group   Summary: 12:00 - 12:50: Occupational Therapy group with cln E. Hollan.  12:50 - 1:00 Clinician assessed for immediate needs, medication compliance and efficacy, and safety concerns.   Therapist Response: 12:00 - 12:50: See note 12:50 - 1:00 pm: At check-out, patient reports no immediate concerns. Patient demonstrates progress as evidenced by continued engagement and responsiveness to treatment. Patient denies SI/HI/self-harm thoughts at the end of group.    Suicidal/Homicidal: Nowithout intent/plan  Plan: Pt will continue in PHP while working to decrease depression and anxiety symptoms, increase emotion regulation, and increase ability to manage symptoms in a healthy manner.   Collaboration of Care: Medication Management AEB C Cosby  Patient/Guardian was advised Release of Information must be obtained prior to any record release in order to collaborate their care with an outside provider. Patient/Guardian was advised if they have not already done so to contact the registration department to sign all necessary forms in order for [place] to release information regarding their care.   Consent: Patient/Guardian gives verbal consent for treatment and assignment of benefits for services provided during this visit. Patient/Guardian expressed understanding and agreed to proceed.   Diagnosis: Severe episode of recurrent major depressive disorder, without psychotic features (HCC) [F33.2]    1. Severe episode of recurrent major depressive disorder, without psychotic features (HCC)   2. GAD (generalized anxiety disorder)      Shannon Guiles, LCSW

## 2023-05-25 NOTE — Psych (Signed)
Virtual Visit via Video Note  I connected with Shannon David on 05/19/23 at  9:00 AM EST by a video enabled telemedicine application and verified that I am speaking with the correct person using two identifiers.  Location: Patient: patient home Provider: clinical home office   I discussed the limitations of evaluation and management by telemedicine and the availability of in person appointments. The patient expressed understanding and agreed to proceed.  I discussed the assessment and treatment plan with the patient. The patient was provided an opportunity to ask questions and all were answered. The patient agreed with the plan and demonstrated an understanding of the instructions.   The patient was advised to call back or seek an in-person evaluation if the symptoms worsen or if the condition fails to improve as anticipated.  Pt was provided 240 minutes of non-face-to-face time during this encounter.   Donia Guiles, LCSW   [facility] PHP THERAPIST PROGRESS NOTE  Shannon David [id number]  Session Time: 9:00 - 10:00  Participation Level: Active  Behavioral Response: CasualAlertDepressed  Type of Therapy: Group Therapy  Treatment Goals addressed: Coping  Progress Towards Goals: Progressing  Interventions: CBT, DBT, Supportive, and Reframing  Summary: Shannon David is a 32 y.o. female who presents with depression and anxiety symptoms.  Clinician led check-in regarding current stressors and situation, and review of patient completed daily inventory. Clinician utilized active listening and empathetic response and validated patient emotions. Clinician facilitated processing group on pertinent issues.?    Therapist Response:  Patient arrived within time allowed. Patient rates her mood at a 8 on a scale of 1-10 with 10 being best. Pt states she feels "happy because I'm with my dog today." Pt states she slept 7 broken hours and ate 2x. Pt reports yesterday went smoothly and she  was able to get some moving tasks accomplished. Pt reports feeling pulled in multiple directions regarding her ongoing separation. Pt shares having multiple mood shifts throughout the day. Pt struggles with stability.   Patient able to process. Patient engaged in discussion.          Session Time: 10:00 am - 11:00 am   Participation Level: Active   Behavioral Response: CasualAlertDepressed   Type of Therapy: Group Therapy   Treatment Goals addressed: Coping   Progress Towards Goals: Progressing   Interventions: CBT, DBT, Solution Focused, Strength-based, Supportive, and Reframing   Therapist Response: Cln led discussion on "work arounds" or finding intermediate solutions while we are in the process of working on a final solution. Group members shared situations in which they are struggling with an issue that they are unable to fix quickly. Cln encouraged pt's to consider ways to make things "doable" and not let "perfect" stand in the way.    Therapist Response: Pt engaged in discussion and is able to connect with topic.           Session Time: 11:00 -12:00   Participation Level: Active   Behavioral Response: CasualAlertDepressed   Type of Therapy: Group Therapy   Treatment Goals addressed: Coping   Progress Towards Goals: Progressing   Interventions: CBT, DBT, Solution Focused, Strength-based, Supportive, and Reframing   Summary: Cln introduced CBT and the way in which it can provide context for addressing stumbling blocks. Group discussed "the problem is not the problem, the problem is how we're thinking about the problem" and tried to change perspective on current struggles.    Therapist Response: Pt engaged in discussion and is able to attempt reframing using  CBT.             Session Time: 12:00 -1:00   Participation Level: Active   Behavioral Response: CasualAlertDepressed   Type of Therapy: Group therapy   Treatment Goals addressed: Coping   Progress  Towards Goals: Progressing   Interventions: OT group   Summary: 12:00 - 12:50: Occupational Therapy group with cln E. Hollan.  12:50 - 1:00 Clinician assessed for immediate needs, medication compliance and efficacy, and safety concerns.   Therapist Response: 12:00 - 12:50: See note 12:50 - 1:00 pm: At check-out, patient reports no immediate concerns. Patient demonstrates progress as evidenced by continued engagement and responsiveness to treatment. Patient denies SI/HI/self-harm thoughts at the end of group.    Suicidal/Homicidal: Nowithout intent/plan  Plan: Pt will continue in PHP while working to decrease depression and anxiety symptoms, increase emotion regulation, and increase ability to manage symptoms in a healthy manner.   Collaboration of Care: Medication Management AEB C Cosby  Patient/Guardian was advised Release of Information must be obtained prior to any record release in order to collaborate their care with an outside provider. Patient/Guardian was advised if they have not already done so to contact the registration department to sign all necessary forms in order for [place] to release information regarding their care.   Consent: Patient/Guardian gives verbal consent for treatment and assignment of benefits for services provided during this visit. Patient/Guardian expressed understanding and agreed to proceed.   Diagnosis: Severe episode of recurrent major depressive disorder, without psychotic features (HCC) [F33.2]    1. Severe episode of recurrent major depressive disorder, without psychotic features (HCC)   2. GAD (generalized anxiety disorder)      Donia Guiles, LCSW

## 2023-05-25 NOTE — Telephone Encounter (Signed)
Devki, Patient dose has increased (based on weight) Current dose is now 500mg .  Please send new script to pharmacy for increased dose to [facility]. Phone: [phone]  Thanks Selena Batten

## 2023-05-26 ENCOUNTER — Other Ambulatory Visit (HOSPITAL_COMMUNITY): Payer: BC Managed Care – PPO

## 2023-05-27 ENCOUNTER — Other Ambulatory Visit (HOSPITAL_COMMUNITY): Payer: BC Managed Care – PPO

## 2023-05-30 ENCOUNTER — Other Ambulatory Visit (HOSPITAL_COMMUNITY): Payer: BC Managed Care – PPO | Attending: Psychiatry | Admitting: Licensed Clinical Social Worker

## 2023-05-30 ENCOUNTER — Ambulatory Visit (HOSPITAL_COMMUNITY): Payer: BC Managed Care – PPO | Admitting: Clinical

## 2023-05-30 DIAGNOSIS — F411 Generalized anxiety disorder: Secondary | ICD-10-CM | POA: Diagnosis not present

## 2023-05-30 DIAGNOSIS — Z79899 Other long term (current) drug therapy: Secondary | ICD-10-CM | POA: Diagnosis not present

## 2023-05-30 DIAGNOSIS — Z5986 Financial insecurity: Secondary | ICD-10-CM | POA: Insufficient documentation

## 2023-05-30 DIAGNOSIS — F332 Major depressive disorder, recurrent severe without psychotic features: Secondary | ICD-10-CM | POA: Insufficient documentation

## 2023-05-30 DIAGNOSIS — M069 Rheumatoid arthritis, unspecified: Secondary | ICD-10-CM | POA: Diagnosis not present

## 2023-05-30 DIAGNOSIS — Z635 Disruption of family by separation and divorce: Secondary | ICD-10-CM | POA: Diagnosis not present

## 2023-05-30 DIAGNOSIS — Z9152 Personal history of nonsuicidal self-harm: Secondary | ICD-10-CM | POA: Insufficient documentation

## 2023-05-31 ENCOUNTER — Encounter (HOSPITAL_COMMUNITY): Payer: Self-pay | Admitting: Family

## 2023-05-31 ENCOUNTER — Other Ambulatory Visit (HOSPITAL_COMMUNITY): Payer: BC Managed Care – PPO | Admitting: Licensed Clinical Social Worker

## 2023-05-31 ENCOUNTER — Other Ambulatory Visit (HOSPITAL_COMMUNITY): Payer: BC Managed Care – PPO | Attending: Psychiatry

## 2023-05-31 ENCOUNTER — Telehealth (HOSPITAL_COMMUNITY): Payer: Self-pay | Admitting: Psychiatry

## 2023-05-31 DIAGNOSIS — F411 Generalized anxiety disorder: Secondary | ICD-10-CM

## 2023-05-31 DIAGNOSIS — R4589 Other symptoms and signs involving emotional state: Secondary | ICD-10-CM | POA: Insufficient documentation

## 2023-05-31 DIAGNOSIS — F332 Major depressive disorder, recurrent severe without psychotic features: Secondary | ICD-10-CM | POA: Diagnosis not present

## 2023-05-31 DIAGNOSIS — G479 Sleep disorder, unspecified: Secondary | ICD-10-CM

## 2023-05-31 MED ORDER — HYDROXYZINE PAMOATE 25 MG PO CAPS: 25.0000 mg | ORAL_CAPSULE | Freq: Every evening | ORAL | 0 refills | Status: DC | PRN

## 2023-05-31 MED ORDER — PAROXETINE HCL 30 MG PO TABS: 60.0000 mg | ORAL_TABLET | Freq: Every day | ORAL | 0 refills | Status: DC

## 2023-05-31 MED ORDER — ACTEMRA 80 MG/4ML IV SOLN: INTRAVENOUS | 5 refills | Status: AC

## 2023-05-31 NOTE — Progress Notes (Signed)
Virtual Visit via Video Note  I connected with Shannon David on 05/31/23 at  9:00 AM EST by a video enabled telemedicine application and verified that I am speaking with the correct person using two identifiers.  Location: Patient: Home Provider: Office   I discussed the limitations of evaluation and management by telemedicine and the availability of in person appointments. The patient expressed understanding and agreed to proceed.   I discussed the assessment and treatment plan with the patient. The patient was provided an opportunity to ask questions and all were answered. The patient agreed with the plan and demonstrated an understanding of the instructions.   The patient was advised to call back or seek an in-person evaluation if the symptoms worsen or if the condition fails to improve as anticipated.  I provided 00 minutes of non-face-to-face time during this encounter.   [name], NP   [facility] Partial Hospitalization Outpatient Program Discharge Summary  Shannon David [id number]  Admission date: 05/11/2023 Discharge date: 05/31/2023  Reason for admission: Per admission assessment note: "Shannon David is a 32 y.o. female with PMH of psychiatric history of MDD, suicidal thoughts and a medical history of rheumatoid arthritis, uncontrolled by current medications who was admitted to [facility] following inpatient psych admission for further management of depression and anxiety in the setting of separation from her husband."    Family of Origin Issues: Recently separation, states she currently is residing in an apartment by herself.  Denied that she has any furniture i.e. couches or bed at this time.  States she continues to wait for her paycheck which she has not received as of yet.  Ongoing financial stressors  Progress in Program Toward Treatment Goals: Progressing patient attended participated with daily group session with active and engaged participation.   It was reported by treatment team that patient had an mini meltdown this previous Friday.  States she started feeling overwhelmed and stressed after speaking to her mother. "  I was crying all while going into [employer] feeling very frustrated that others were shopping on [facility] Friday and Christmas." states she has been crying all that day.  States she went home and started cutting.  Discussed following up with primary care for possible infection and signs and symptoms to monitor.  She reports the lacerations are superficial.  She reports she has been taking her medications as indicated. States she was cutting to relieve anxiety.  Denied that it was a suicidal attempt.  Will continue to monitor mood and symptoms.  Progress (rationale): Patient stepping down to intensive outpatient programming, Patient was started on Hydroxyzine 25 mg-50 mg in addition to Trazodone.   Collaboration of Care: Medication Management AEB Refilled Prozac  Patient/Guardian was advised Release of Information must be obtained prior to any record release in order to collaborate their care with an outside provider. Patient/Guardian was advised if they have not already done so to contact the registration department to sign all necessary forms in order for [place] to release information regarding their care.   Consent: Patient/Guardian gives verbal consent for treatment and assignment of benefits for services provided during this visit. Patient/Guardian expressed understanding and agreed to proceed.   Hillery Jacks NP  05/31/2023

## 2023-05-31 NOTE — Addendum Note (Signed)
Addended by: Murrell Redden on: 05/31/2023 11:50 AM   Modules accepted: Orders

## 2023-05-31 NOTE — Telephone Encounter (Signed)
Rx for Actemra IV 28mL (7 vials) sent to [facility]  Chesley Mires, PharmD, MPH, BCPS, CPP Clinical Pharmacist (Rheumatology and Pulmonology)

## 2023-06-01 ENCOUNTER — Other Ambulatory Visit (HOSPITAL_COMMUNITY): Payer: BC Managed Care – PPO

## 2023-06-01 ENCOUNTER — Encounter (HOSPITAL_COMMUNITY): Payer: Self-pay | Admitting: Psychiatry

## 2023-06-01 ENCOUNTER — Other Ambulatory Visit (HOSPITAL_COMMUNITY): Payer: BC Managed Care – PPO | Attending: Psychiatry | Admitting: Psychiatry

## 2023-06-01 ENCOUNTER — Encounter (HOSPITAL_COMMUNITY): Payer: Self-pay

## 2023-06-01 DIAGNOSIS — F332 Major depressive disorder, recurrent severe without psychotic features: Secondary | ICD-10-CM | POA: Insufficient documentation

## 2023-06-01 DIAGNOSIS — Z79899 Other long term (current) drug therapy: Secondary | ICD-10-CM | POA: Insufficient documentation

## 2023-06-01 DIAGNOSIS — F411 Generalized anxiety disorder: Secondary | ICD-10-CM | POA: Insufficient documentation

## 2023-06-01 NOTE — Progress Notes (Signed)
Daily Group Progress Note  Program: IOP  Virtual Visit via Video Note  I connected with  Shannon Shannon on 06/01/23 at  9:00 AM EST by a video enabled telemedicine application and verified that I am speaking with the correct person using two identifiers.  Location: Patient: pt's home in [place], [place] Provider: clinical office in [place], [place]   At orientation to the IOP program, Case Manager discussed the limitations of evaluation and management by telemedicine and the availability of in person appointments. The patient expressed understanding and agreed to proceed with virtual visits throughout the duration of the program.    History of Present Illness: MDD severe and GAD    Observations/Objective: Check In: Case Manager checked in with all participants to review discharge dates, insurance authorizations, work-related documents and needs from the treatment team regarding medications. Shannon Shannon stated needs and engaged in discussion.        Initial Therapeutic Activity:  Counselor facilitated a check-in with Shannon Shannon to assess for safety, sobriety and medication compliance. Counselor also inquired about pt's current emotional ratings, as well as any significant changes in thoughts, feelings or behavior since previous check in. Shannon Shannon presented for session on time and was alert, oriented x5, with no evidence or self-report of active SI/HI or A/V H. Pt reported compliance with medication and endorsed drinking one beer last night, denied using other substances. Pt reported scores of  3/10 for depression, 3/10 for anxiety, and 1/10 for anger/irritability. Pt denied any recent outbursts or panic attacks. Pt reported that a recent success was getting her paycheck, as she had not been paid for 5 weeks. Pt reported that a struggle was cutting herself, as she had previously abstained from self-harming. Pt reported that her goal today is to finish paying her bills and to try to find a small couch and a  tv.       Second Therapeutic Activity:  Counselor introduced Shannon Shannon, [employer] Pharmacist, to provide psychoeducation on topic of medication compliance with members today.  Shannon Shannon provided psychoeducation on classes of medications such as antidepressants, antipsychotics, what symptoms they are intended to treat, and any side effects one might encounter while on a particular prescription.  Time was allowed for clients to ask any questions they might have of [facility] regarding this specialty.  Intervention was effective/ineffective as evidenced by pt asking questions.        Third Therapeutic Activity:  Psycho-educational portion of group was co-facilitated by wellness director (Shannon Stall, MS, MPH, CHES) focused on self-care in daily life. Facilitator and group members discussed presented materials regarding importance of sleep, diet, and exercise. Group members discussed any changes they are willing to make to improve an area of self-care in their lives (physical, psychological, emotional, spiritual, relationship, professional) to improve overall mental health as they continue with treatment. Intervention was effective as evidenced by pt engaging in discussion.  Assessment and Plan: Counselor recommends that Shannon David remain in IOP treatment to better manage mental health symptoms, ensure stability and pursue completion of treatment plan goals. Counselor recommends adherence to crisis/safety plan, taking medications as prescribed, and following up with medical professionals if any issues arise.     Follow Up Instructions: Counselor will send [employer] Teams link for session tomorrow.  Shannon Shannon was advised to call back or seek an in-person evaluation if the symptoms worsen or if the condition fails to improve as anticipated.    I discussed the assessment and treatment plan with the patient. The patient was provided an opportunity  to ask questions and all were answered. The patient agreed with the plan and  demonstrated an understanding of the instructions.     I provided 180 minutes of non-face-to-face time during this encounter.   Collaboration of Care: Medication Management AEB Hillery Jacks, NP and Lamar Sprinkles, MD and Other Jeri Modena, case manager  Patient/Guardian was advised Release of Information must be obtained prior to any record release in order to collaborate their care with an outside provider. Patient/Guardian was advised if they have not already done so to contact the registration department to sign all necessary forms in order for [place] to release information regarding their care.   Consent: Patient/Guardian gives verbal consent for treatment and assignment of benefits for services provided during this visit. Patient/Guardian expressed understanding and agreed to proceed.    [name], LCSW

## 2023-06-01 NOTE — Progress Notes (Signed)
Virtual Visit via Video Note  I connected with Shannon David on @TODAY @ at  9:00 AM EST by a video enabled telemedicine application and verified that I am speaking with the correct person using two identifiers.  Location: Patient: at home Provider: at office   I discussed the limitations of evaluation and management by telemedicine and the availability of in person appointments. The patient expressed understanding and agreed to proceed.  I discussed the assessment and treatment plan with the patient. The patient was provided an opportunity to ask questions and all were answered. The patient agreed with the plan and demonstrated an understanding of the instructions.   The patient was advised to call back or seek an in-person evaluation if the symptoms worsen or if the condition fails to improve as anticipated.  I provided 30 minutes of non-face-to-face time during this encounter.   Jeri Modena, M.Ed,CNA   Patient ID: Shannon David, female   DOB: [birth year], 32 y.o.   MRN: [id number] D:  As per previous CCA states "Shannon David is a 32yo female referred to [facility] following d/c from [facility] [place] for SI with a plan and intent. She cites her stressors as work, school, and most recently living with her husband and taking care of his children. She reports since separating from her husband, she is feeling more hopeful and less stressed. She denies SI at this time. She reports decreased ADLs, stating that prior to her hospitalization her hygiene was significantly decreased because she was sleeping more. She currently sees a therapist and psychiatrist, both within this office, and has had med man over the past couple of years. She denies other tx hx and hospitalizations. She denies suicide attempts, HI, AVH, substance use, and states there are no firearms in her home. She reports being diagnosed with MDD and GAD and endorses NSSI prior to her inpatient admission, but denies previous history of NSSI. She cites  her parents and friends as her supports and states she made new friends while at [facility]. She reports her mother has depression and bipolar disorder and her brother has depression. She states she is currently staying with her father and will be moving into her own apartment next week. She reports medical hx of rheumatioid arthritis, IBS, and underactive thyroid.   Pt stepped down from virtual PHP to virtual MH-IOP today.  Denies any SI/HI or A/V hallucinations.  Pt admits to feeling sad last Friday and having a "mini meltdown."  Pt states she has yet to receive a check from short term disability.  "I noticed others Christmas shopping last Friday and I had been crying all day.  I went home and started cutting." Pt reports not feeling that way at this time.  Discussed safety options at length in case sx's return. A:  Oriented pt.  Pt was advised of ROI must be obtained prior to any records release in order to collaborate her care with an outside provider.  Pt was advised if she has not already done so to contact the front desk to sign all necessary forms in order for MH-IOP to release info re: her care.  Consent:  Pt gives verbal consent for tx and assignment of benefits for services provided during this telehealth group process.  Pt expressed understanding and agreed to proceed. Collaboration of care:  Collaborate with Dr. Lamar Sprinkles AEB,  Shannon Jacks, NP AEB; Shannon Stain, LCSW AEB, Ambrose Mantle AEB and Dr. Iona Coach.  Encouraged support groups through [facility].  Pt will  improve her mood as evidenced by being happy again, managing her mood and coping with daily stressors for 5 out of 7 days for 60 days.  R:  Pt receptive.   Jeri Modena, M.Ed,CNA

## 2023-06-02 ENCOUNTER — Other Ambulatory Visit (HOSPITAL_COMMUNITY): Payer: BC Managed Care – PPO | Admitting: Licensed Clinical Social Worker

## 2023-06-02 DIAGNOSIS — F411 Generalized anxiety disorder: Secondary | ICD-10-CM

## 2023-06-02 DIAGNOSIS — Z79899 Other long term (current) drug therapy: Secondary | ICD-10-CM | POA: Diagnosis not present

## 2023-06-02 DIAGNOSIS — F332 Major depressive disorder, recurrent severe without psychotic features: Secondary | ICD-10-CM | POA: Diagnosis present

## 2023-06-03 ENCOUNTER — Telehealth (HOSPITAL_COMMUNITY): Payer: Self-pay | Admitting: Psychiatry

## 2023-06-03 ENCOUNTER — Other Ambulatory Visit (HOSPITAL_COMMUNITY): Payer: BC Managed Care – PPO | Admitting: Psychiatry

## 2023-06-03 ENCOUNTER — Encounter (HOSPITAL_COMMUNITY): Payer: Self-pay
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
## 2023-06-03 NOTE — Therapy (Signed)
[facility] PARTIAL HOSPITALIZATION PROGRAM [address] [place], [place], [address] Phone: [phone]   Fax:  [phone]  Occupational Therapy Treatment Virtual Visit via Video Note  I connected with Shannon David on 06/03/23 at  8:00 AM EST by a video enabled telemedicine application and verified that I am speaking with the correct person using two identifiers.  Location: Patient: home Provider: office   I discussed the limitations of evaluation and management by telemedicine and the availability of in person appointments. The patient expressed understanding and agreed to proceed.    The patient was advised to call back or seek an in-person evaluation if the symptoms worsen or if the condition fails to improve as anticipated.  I provided 55 minutes of non-face-to-face time during this encounter.   Patient Details  Name: Shannon David MRN: [id number] Date of Birth: 1991-06-22 No data recorded  Encounter Date: 05/31/2023   OT End of Session - 06/03/23 0955     Visit Number 12    Number of Visits 20    Date for OT Re-Evaluation 06/05/23    OT Start Time 1200    OT Stop Time 1255    OT Time Calculation (min) 55 min             Past Medical History:  Diagnosis Date   Anxiety    COVID    History of Clostridium difficile infection 01/2023   History of hypothyroidism    History of PCOS    Major depressive disorder, recurrent severe without psychotic features (HCC)    Passive suicidal ideations 11/11/2022   Rheumatoid arthritis (HCC)    Suicidal behavior with attempted self-injury (HCC) 04/22/2023    Past Surgical History:  Procedure Laterality Date   BUNIONECTOMY Right    FOOT SURGERY Right 08/24/2021    There were no vitals filed for this visit.   Subjective Assessment - 06/03/23 0955     Currently in Pain? No/denies    Pain Score 0-No pain                 Group Session:  S: Doing better today. Optimistic  about moving on. I feel like I've learned a lot from this program and will continue to use these skills.   O: During today's OT group session, the patient participated in an educational segment about the importance of goal-setting and the application of the SMART framework to enhance daily life, particularly focusing on ADLs and iADLs. The session began with five open-ended pre-session questions that facilitated group discussion and introspection about their current relationship with goals. Following the introduction and educational segment, participants engaged in brainstorming and group discussions to devise hypothetical SMART goals. The session concluded with five post-session questions to reinforce understanding and facilitate reflection. Throughout the session, there was a range of engagement levels noted among the participants.   A:  Patient demonstrated a high level of engagement throughout the session. They actively participated in discussions, sharing personal experiences related to goal setting and challenges faced. Patient was able to clearly articulate an understanding of the SMART framework and proposed personal SMART goals related to their own ADLs with minimal assistance. They expressed enthusiasm about applying what they learned to their daily routine and appeared motivated to make changes.  OCCUPATIONAL THERAPY DISCHARGE SUMMARY  Visits from Start of Care: 12  Current functional level related to goals / functional outcomes: Pt has met all OT goals and is ready for DC at this time.  Remaining deficits: N/A    Plan: Patient agrees to discharge.                      OT Education - 06/03/23 0955     Education Details SMART Goals              OT Short Term Goals - 05/06/23 0943       OT SHORT TERM GOAL #1   Title Patient will be educated on strategies to improve psychosocial skills needed to participate fully in all daily, work, and leisure  activities.    Time 4    Period Weeks    Status met   Target Date 06/05/23      OT SHORT TERM GOAL #2   Title Pt will apply psychosocial skills and coping mechanisms to daily activities in order to function independently and reintegrate into community dwelling    Status met     OT SHORT TERM GOAL #3   Title Pt will choose and/or engage in 1-3 socially engaging leisure activities to improve social participation skills upon reintegrating into community    Status met                     Plan - 06/03/23 0956     Psychosocial Skills Coping Strategies;Habits;Interpersonal Interaction;Routines and Behaviors             Patient will benefit from skilled therapeutic intervention in order to improve the following deficits and impairments:       Psychosocial Skills: Coping Strategies, Habits, Interpersonal Interaction, Routines and Behaviors   Visit Diagnosis: Difficulty coping    Problem List Patient Active Problem List   Diagnosis Date Noted   Severe recurrent major depression without psychotic features (HCC) 05/06/2023   GAD (generalized anxiety disorder) 04/22/2023   MDD (major depressive disorder), recurrent episode, moderate (HCC) 04/21/2023   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   Major depressive disorder, recurrent episode, moderate degree (HCC) 11/11/2022   Pain in left knee 06/04/2021   History of PCOS 02/21/2017   High risk medication use 09/21/2016   Rheumatoid arthritis involving multiple sites with positive rheumatoid factor (HCC) 09/21/2016   History of hypothyroidism 09/21/2016   History of depression 09/21/2016    Ted Mcalpine, OT 06/03/2023, 9:56 AM  Kerrin Champagne, OT   [facility] HOSPITALIZATION PROGRAM [address] [place], [place], [address] Phone: [phone]   Fax:  [phone]  Name: Shannon David MRN: [id number] Date of Birth: [birth year]

## 2023-06-06 ENCOUNTER — Other Ambulatory Visit (HOSPITAL_COMMUNITY): Payer: BC Managed Care – PPO | Admitting: Psychiatry

## 2023-06-06 ENCOUNTER — Telehealth (HOSPITAL_COMMUNITY): Payer: Self-pay | Admitting: Psychiatry

## 2023-06-07 ENCOUNTER — Other Ambulatory Visit (HOSPITAL_COMMUNITY): Payer: BC Managed Care – PPO | Admitting: Licensed Clinical Social Worker

## 2023-06-07 ENCOUNTER — Telehealth: Payer: Self-pay | Admitting: *Deleted

## 2023-06-07 DIAGNOSIS — F332 Major depressive disorder, recurrent severe without psychotic features: Secondary | ICD-10-CM

## 2023-06-07 DIAGNOSIS — F411 Generalized anxiety disorder: Secondary | ICD-10-CM

## 2023-06-07 NOTE — Telephone Encounter (Signed)
Patient contacted the office stating she was seen at urgent care and was diagnosed with flu and Covid. Patient states she was given a prescription for Tamiflu but not for Paxlovid. Patient states she was advised to contact our office. Patient advised we do not prescribe the Paxlovid and she will need to contact her PCP. Patient advised she will need to hold her her MTX for 2 weeks after her symptoms resolve. Patient advised she can continue the Actemra and PLQ. Patient expressed understanding.

## 2023-06-07 NOTE — Progress Notes (Signed)
Virtual Visit via Video Note   I connected with Shannon David on 06/07/23 at  9:00 AM EDT by a video enabled telemedicine application and verified that I am speaking with the correct person using two identifiers.   At orientation to the IOP program, Case Manager discussed the limitations of evaluation and management by telemedicine and the availability of in person appointments. The patient expressed understanding and agreed to proceed with virtual visits throughout the duration of the program.   Location:  Patient: Patient Home Provider: Home Office   History of Present Illness: MDD and GAD   Observations/Objective: Check In: Case Manager checked in with all participants to review discharge dates, insurance authorizations, work-related documents and needs from the treatment team regarding medications. Shannon David stated needs and engaged in discussion.    Initial Therapeutic Activity: Counselor facilitated a check-in with Shannon David to assess for safety, sobriety and medication compliance.  Counselor also inquired about Shannon David's current emotional ratings, as well as any significant changes in thoughts, feelings or behavior since previous check in.  Shannon David presented for session on time and was alert, oriented x5, with no evidence or self-report of active SI/HI or A/V H.  Shannon David reported compliance with medication and denied use of alcohol or illicit substances.  Shannon David reported scores of 6/10 for depression, 4/10 for anxiety, and 0/10 for anger/irritability.  Shannon David denied any recent outbursts or panic attacks.  Shannon David reported that a struggle was missing group yesterday due to oversleeping.  She reported that she has been dealing with a cold, and her schedule has been off as a result.  Shannon David reported that her goal today is to continue taking OTC medication for symptoms, but consider going to urgent care if symptoms do not improve.   Second Therapeutic Activity: Counselor introduced topic of  assertive communication today.  Counselor shared various handouts with members virtually in group to read along with on the subject.  These handouts defined assertive communication as a communication style in which a person stands up for their own needs and wants, while also taking into consideration the needs and wants of others, without behaving in a passive or aggressive way.  Traits of assertive communicators were highlighted such as using appropriate speaking volume, maintaining eye contact, using confident language, and avoiding interruption.  Members were also provided with tips on how to improve communication, including respecting oneself, expressing thoughts and feelings calmly, and saying "No" when necessary.  Members were given a variety of scenarios where they could practice using these tips to respond in an assertive manner.  Intervention was effective, as evidenced by Shannon David participating in discussion on topic, reporting that she has a passive communication style due to traits such as being soft spoken and quiet, having poor eye contact, allowing others to take advantage, lacking confidence, and prioritizing the needs of others.  Shannon David reported that this was most prominent in her marriage, stating "It happened for years with my husband.  Towards the end I started speaking up and he got angry, he didn't like that".  Shannon David left session early at 10:40am in order to go to urgent care to address physical symptoms.    Assessment and Plan: Counselor recommends that Shannon David remain in IOP treatment to better manage mental health symptoms, ensure stability and pursue completion of treatment plan goals. Counselor recommends adherence to crisis/safety plan, taking medications as prescribed, and following up with medical professionals if any issues arise.    Follow Up Instructions: Counselor will send Webex link for  session tomorrow.  Shannon David was advised to call back or seek an in-person evaluation if  the symptoms worsen or if the condition fails to improve as anticipated.   Collaboration of Care:   Medication Management AEB [name] or Hillery Jacks, NP                                          Case Manager AEB Jeri Modena, CNA    Patient/Guardian was advised Release of Information must be obtained prior to any record release in order to collaborate their care with an outside provider. Patient/Guardian was advised if they have not already done so to contact the registration department to sign all necessary forms in order for [place] to release information regarding their care.    Consent: Patient/Guardian gives verbal consent for treatment and assignment of benefits for services provided during this visit. Patient/Guardian expressed understanding and agreed to proceed.   I provided 100 minutes of non-face-to-face time during this encounter.   Noralee Stain, LCSW, LCAS 06/07/23

## 2023-06-08 ENCOUNTER — Telehealth (HOSPITAL_COMMUNITY): Payer: Self-pay | Admitting: Psychiatry

## 2023-06-08 ENCOUNTER — Other Ambulatory Visit (HOSPITAL_COMMUNITY): Payer: BC Managed Care – PPO | Admitting: Psychiatry

## 2023-06-09 ENCOUNTER — Other Ambulatory Visit (HOSPITAL_COMMUNITY): Payer: BC Managed Care – PPO | Admitting: Psychiatry

## 2023-06-09 ENCOUNTER — Other Ambulatory Visit (HOSPITAL_COMMUNITY): Payer: Self-pay | Admitting: Psychiatry

## 2023-06-09 DIAGNOSIS — F332 Major depressive disorder, recurrent severe without psychotic features: Secondary | ICD-10-CM | POA: Diagnosis not present

## 2023-06-09 DIAGNOSIS — F411 Generalized anxiety disorder: Secondary | ICD-10-CM

## 2023-06-09 MED ORDER — TRAZODONE HCL 100 MG PO TABS: 100.0000 mg | ORAL_TABLET | Freq: Every evening | ORAL | 0 refills | Status: DC | PRN

## 2023-06-09 NOTE — Progress Notes (Signed)
Virtual Visit via Video Note   I connected with Shannon David on 06/09/23 at  9:00 AM EDT by a video enabled telemedicine application and verified that I am speaking with the correct person using two identifiers.   At orientation to the IOP program, Case Manager discussed the limitations of evaluation and management by telemedicine and the availability of in person appointments. The patient expressed understanding and agreed to proceed with virtual visits throughout the duration of the program.   Location:  Patient: Patient Home Provider: OPT BH Office   History of Present Illness: MDD and GAD   Observations/Objective: Check In: Case Manager checked in with all participants to review discharge dates, insurance authorizations, work-related documents and needs from the treatment team regarding medications. Shannon David stated needs and engaged in discussion.    Initial Therapeutic Activity: Counselor facilitated a check-in with Shannon David to assess for safety, sobriety and medication compliance.  Counselor also inquired about Shannon David's current emotional ratings, as well as any significant changes in thoughts, feelings or behavior since previous check in.  Shannon David presented for session on time and was alert, oriented x5, with no evidence or self-report of active SI/HI or A/V H.  Shannon David reported compliance with medication and denied use of alcohol or illicit substances.  Shannon David reported scores of 3/10 for depression, 2/10 for anxiety, and 0/10 for anger/irritability.  Shannon David denied any recent outbursts or panic attacks.  Shannon David reported that a recent success was taking her final for class yesterday and passing.  She reported that she also has an interview later this week.  Shannon David reported that a struggle was contracting COVID and the flu, which led her to miss group so she could rest.  Shannon David reported that her goal today is to continue resting after group and take the Paxlovid her doctor prescribed.         Second Therapeutic Activity: Counselor discussed topic of sleep hygiene today with group.  Counselor defined this as the habits, behaviors and environmental factors that can be adjusted to improve overall sleep quality.  Counselor also discussed how lack of consistent sleep can negatively affect mood and overall mental health.  Counselor provided members with a screening to complete assessing typical barriers one might face in achieving quality sleep at night (i.e. struggling to get up in the morning, waking up throughout the night, tossing/turning, etc), and inquired about members' perception of sleep hygiene at present.  Counselor offered techniques to members via a virtual handout which could be implemented in order to improve sleep hygiene, such as sticking to a normal morning/night routine, avoiding using of electronics too close to bedtime, avoiding heavy meals before bed, using a sleep journal to track changes and address anxious thoughts, as well as avoiding naps during the daytime, ensuring proper use of medications if prescribed any by provider(s), and more.  Counselor inquired about changes members intend to make to sleep hygiene based upon information covered today.  Interventions were effective, as evidenced by Shannon David actively participating in discussion on subject, reporting that she has struggled with sleep problems in recent years, and was previously scheduled to have a sleep study.  Shannon David also completed a sleep assessment, which revealed that she is currently sleep deprived, since she averages around 5 hours of rest most nights.  Shannon David reported that she plans to improve sleep hygiene over the course of treatment by listening to a white noise application on her phone, avoiding caffeine close to bedtime, trying to avoid using the bed for anything  other than sleep, putting on a sleep mask as part of her relaxing nighttime ritual, and adjusting diet to ensure nighttime meals don't interfere  with falling asleep.    Assessment and Plan: Counselor recommends that Shannon David remain in IOP treatment to better manage mental health symptoms, ensure stability and pursue completion of treatment plan goals. Counselor recommends adherence to crisis/safety plan, taking medications as prescribed, and following up with medical professionals if any issues arise.    Follow Up Instructions: Counselor will send Webex link for session tomorrow.  Shannon David was advised to call back or seek an in-person evaluation if the symptoms worsen or if the condition fails to improve as anticipated.   Collaboration of Care:   Medication Management AEB [name] or Hillery Jacks, NP                                          Case Manager AEB Jeri Modena, CNA    Patient/Guardian was advised Release of Information must be obtained prior to any record release in order to collaborate their care with an outside provider. Patient/Guardian was advised if they have not already done so to contact the registration department to sign all necessary forms in order for [place] to release information regarding their care.    Consent: Patient/Guardian gives verbal consent for treatment and assignment of benefits for services provided during this visit. Patient/Guardian expressed understanding and agreed to proceed.   I provided 180 minutes of non-face-to-face time during this encounter.   Noralee Stain, [place], LCAS 06/09/23

## 2023-06-09 NOTE — Progress Notes (Unsigned)
Trazodone refilled and sent to patient's preferred pharmacy.

## 2023-06-10 ENCOUNTER — Other Ambulatory Visit (HOSPITAL_COMMUNITY): Payer: BC Managed Care – PPO | Admitting: Psychiatry

## 2023-06-10 DIAGNOSIS — F332 Major depressive disorder, recurrent severe without psychotic features: Secondary | ICD-10-CM

## 2023-06-10 DIAGNOSIS — F411 Generalized anxiety disorder: Secondary | ICD-10-CM

## 2023-06-10 NOTE — Progress Notes (Deleted)
Office Visit Note  Patient: Shannon David             Date of Birth: 08-30-1990           MRN: [id number]             PCP: Lars Mage, NP Referring: Lars Mage, NP Visit Date: 06/24/2023 Occupation: @GUAROCC @  Subjective:    History of Present Illness: Shannon David is a 32 y.o. female with history of seropositive rheumatoid arthritis and osteoarthritis.  Patient remains on  IV Actemra-first infusion on 11/30/2022, Plaquenil 200 mg 1 tablet BID Monday-Friday (restarted 10/2021), Methotrexate 8 tablets once wkly folic acid 2 mg daily.   PLQ Eye Exam: 10/19/2022 WNL @ [facility] Follow up in 12 months.  CBC and CMP updated on 04/09/23.   TB gold negative on 10/18/22 Advised patient to hold Actemra and methotrexate if she develops signs or symptoms of an infection and to resume once the infection has completely cleared.   Activities of Daily Living:  Patient reports morning stiffness for *** {minute/hour:19697}.   Patient {ACTIONS;DENIES/REPORTS:21021675::"Denies"} nocturnal pain.  Difficulty dressing/grooming: {ACTIONS;DENIES/REPORTS:21021675::"Denies"} Difficulty climbing stairs: {ACTIONS;DENIES/REPORTS:21021675::"Denies"} Difficulty getting out of chair: {ACTIONS;DENIES/REPORTS:21021675::"Denies"} Difficulty using hands for taps, buttons, cutlery, and/or writing: {ACTIONS;DENIES/REPORTS:21021675::"Denies"}  No Rheumatology ROS completed.   PMFS History:  Patient Active Problem List   Diagnosis Date Noted   Severe recurrent major depression without psychotic features (HCC) 05/06/2023   GAD (generalized anxiety disorder) 04/22/2023   MDD (major depressive disorder), recurrent episode, moderate (HCC) 04/21/2023   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   Major depressive disorder, recurrent episode, moderate degree (HCC) 11/11/2022   Pain in left knee 06/04/2021   History of PCOS 02/21/2017   High risk medication use 09/21/2016   Rheumatoid arthritis  involving multiple sites with positive rheumatoid factor (HCC) 09/21/2016   History of hypothyroidism 09/21/2016   History of depression 09/21/2016    Past Medical History:  Diagnosis Date   Anxiety    COVID    History of Clostridium difficile infection 01/2023   History of hypothyroidism    History of PCOS    Major depressive disorder, recurrent severe without psychotic features (HCC)    Passive suicidal ideations 11/11/2022   Rheumatoid arthritis (HCC)    Suicidal behavior with attempted self-injury (HCC) 04/22/2023    Family History  Problem Relation Age of Onset   Depression Mother    Bipolar disorder Mother    Hypertension Mother    Clotting disorder Mother    Depression Brother    Rheum arthritis Brother    Lung cancer Paternal Uncle    Cancer Maternal Grandmother        lung   Past Surgical History:  Procedure Laterality Date   BUNIONECTOMY Right    FOOT SURGERY Right 08/24/2021   Social History   Social History Narrative   Left Handed    Lives in a one story home   Drinks Caffeine    Immunization History  Administered Date(s) Administered   PFIZER(Purple Top)SARS-COV-2 Vaccination 10/23/2021     Objective: Vital Signs: There were no vitals taken for this visit.   Physical Exam Vitals and nursing note reviewed.  Constitutional:      Appearance: She is well-developed.  HENT:     Head: Normocephalic and atraumatic.  Eyes:     Conjunctiva/sclera: Conjunctivae normal.  Cardiovascular:     Rate and Rhythm: Normal rate and regular rhythm.     Heart sounds: Normal heart sounds.  Pulmonary:     Effort: Pulmonary effort is normal.     Breath sounds: Normal breath sounds.  Abdominal:     General: Bowel sounds are normal.     Palpations: Abdomen is soft.  Musculoskeletal:     Cervical back: Normal range of motion.  Lymphadenopathy:     Cervical: No cervical adenopathy.  Skin:    General: Skin is warm and dry.     Capillary Refill: Capillary refill  takes less than 2 seconds.  Neurological:     Mental Status: She is alert and oriented to person, place, and time.  Psychiatric:        Behavior: Behavior normal.      Musculoskeletal Exam: ***  CDAI Exam: CDAI Score: -- Patient Global: --; Provider Global: -- Swollen: --; Tender: -- Joint Exam 06/24/2023   No joint exam has been documented for this visit   There is currently no information documented on the homunculus. Go to the Rheumatology activity and complete the homunculus joint exam.  Investigation: No additional findings.  Imaging: No results found.  Recent Labs: Lab Results  Component Value Date   WBC 3.7 (L) 04/09/2023   HGB 12.5 04/09/2023   PLT 226 04/09/2023   NA 133 (L) 04/09/2023   K 3.7 04/09/2023   CL 104 04/09/2023   CO2 22 04/09/2023   GLUCOSE 90 04/09/2023   BUN 15 04/09/2023   CREATININE 0.60 04/09/2023   BILITOT 0.8 04/09/2023   ALKPHOS 55 04/09/2023   AST 30 04/09/2023   ALT 44 04/09/2023   PROT 6.9 04/09/2023   ALBUMIN 4.0 04/09/2023   CALCIUM 8.0 (L) 04/09/2023   GFRAA 134 07/18/2020   QFTBGOLD Negative 08/13/2016   QFTBGOLDPLUS NEGATIVE 10/18/2022    Speciality Comments: PLQ Eye Exam: 10/19/2022 WNL @ [facility] Follow up in 12 months.   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  No orders of the defined types were placed in this encounter.   Face-to-face time spent with patient was *** minutes. Greater than 50% of time was spent in counseling and coordination of care.  Follow-Up Instructions: No follow-ups on file.   Gearldine Bienenstock, PA-C  Note - This record has been created using Dragon software.  Chart creation errors have been sought, but may not always  have been located. Such creation errors do not reflect on  the standard of medical care.

## 2023-06-10 NOTE — Progress Notes (Signed)
Virtual Visit via Video Note   I connected with [name] on 06/10/23 at  9:00 AM EDT by a video enabled telemedicine application and verified that I am speaking with the correct person using two identifiers.   At orientation to the IOP program, Case Manager discussed the limitations of evaluation and management by telemedicine and the availability of in person appointments. The patient expressed understanding and agreed to proceed with virtual visits throughout the duration of the program.   Location:  Patient: Patient Home Provider: Home Office   History of Present Illness: MDD and GAD   Observations/Objective: Check In: Case Manager checked in with all participants to review discharge dates, insurance authorizations, work-related documents and needs from the treatment team regarding medications. Shaunelle stated needs and engaged in discussion.    Initial Therapeutic Activity: Counselor facilitated a check-in with Chaise to assess for safety, sobriety and medication compliance.  Counselor also inquired about Sheily's current emotional ratings, as well as any significant changes in thoughts, feelings or behavior since previous check in.  Elbia presented for session on time and was alert, oriented x5, with no evidence or self-report of active SI/HI or A/V H.  Matsuko reported compliance with medication and denied use of alcohol or illicit substances.  Dominika reported scores of 2/10 for depression, 5/10 for anxiety, and 0/10 for anger/irritability.  Cecely denied any recent outbursts or panic attacks.  Jeorgia reported that a recent success was staying busy yesterday getting her car inspected.  Leanndra reported that an ongoing struggle has been dealing with a cough related to her COVID diagnosis.  Amoni reported that her goal this weekend is to prepare for another exam coming up next week.        Second Therapeutic Activity: Counselor discussed topic of distress tolerance today with  group.  Counselor shared virtual handout with members that offered a DBT approach represented by the acronym ACCEPTS, and outlined strategies for distracting oneself from distressing emotions, allowing appropriate time for these emotions to lesson in intensity and eventually fade away.  Strategies offered included engaging in positive activities, contributing to the wellbeing of others, comparing one's present situation to a previously difficult one to highlight resilience, using mental imagery, and physical grounding.  Counselor assisted members in creating their own realistic ACCEPTS plan for tackling a distressing emotion of their choice.  Intervention was effective, as evidenced by [employer] participating in creation of the plan, choosing exhausted as her emotion of focus, and identifying several helpful approaches for distraction, such as painting, working on a school assignment, cook a nice meal for someone else, finding a reason to laugh or smile, taking a nap, or doing a sudoku puzzle.    Assessment and Plan: Counselor recommends that Moncerrath remain in IOP treatment to better manage mental health symptoms, ensure stability and pursue completion of treatment plan goals. Counselor recommends adherence to crisis/safety plan, taking medications as prescribed, and following up with medical professionals if any issues arise.    Follow Up Instructions: Counselor will send Webex link for session tomorrow.  Karyzma was advised to call back or seek an in-person evaluation if the symptoms worsen or if the condition fails to improve as anticipated.   Collaboration of Care:   Medication Management AEB [name] or Hillery Jacks, NP  Case Manager AEB Jeri Modena, CNA    Patient/Guardian was advised Release of Information must be obtained prior to any record release in order to collaborate their care with an outside provider. Patient/Guardian was advised if they have not  already done so to contact the registration department to sign all necessary forms in order for [place] to release information regarding their care.    Consent: Patient/Guardian gives verbal consent for treatment and assignment of benefits for services provided during this visit. Patient/Guardian expressed understanding and agreed to proceed.   I provided 180 minutes of non-face-to-face time during this encounter.   Noralee Stain, [place], LCAS 06/10/23

## 2023-06-13 ENCOUNTER — Ambulatory Visit (HOSPITAL_COMMUNITY): Payer: BC Managed Care – PPO | Admitting: Clinical

## 2023-06-13 ENCOUNTER — Other Ambulatory Visit (HOSPITAL_COMMUNITY): Payer: BC Managed Care – PPO | Admitting: Psychiatry

## 2023-06-13 DIAGNOSIS — F332 Major depressive disorder, recurrent severe without psychotic features: Secondary | ICD-10-CM

## 2023-06-13 DIAGNOSIS — F411 Generalized anxiety disorder: Secondary | ICD-10-CM

## 2023-06-13 NOTE — Progress Notes (Signed)
Virtual Visit via Video Note   I connected with [name] on 06/13/23 at  9:00 AM EDT by a video enabled telemedicine application and verified that I am speaking with the correct person using two identifiers.   At orientation to the IOP program, Case Manager discussed the limitations of evaluation and management by telemedicine and the availability of in person appointments. The patient expressed understanding and agreed to proceed with virtual visits throughout the duration of the program.   Location:  Patient: Patient Home Provider: Home Office   History of Present Illness: MDD and GAD   Observations/Objective: Check In: Case Manager checked in with all participants to review discharge dates, insurance authorizations, work-related documents and needs from the treatment team regarding medications. Emmah stated needs and engaged in discussion.    Initial Therapeutic Activity: Counselor facilitated a check-in with [place] to assess for safety, sobriety and medication compliance.  Counselor also inquired about Alois's current emotional ratings, as well as any significant changes in thoughts, feelings or behavior since previous check in.  Camree presented for session on time and was alert, oriented x5, with no evidence or self-report of active SI/HI or A/V H.  Kameryn reported compliance with medication and denied use of alcohol or illicit substances.  Therisa reported scores of 1/10 for depression, 0/10 for anxiety, and 0/10 for anger/irritability.  Kadesha denied any recent outbursts or panic attacks.  Mindee reported that a recent success was attending a virtual job interview last week, which went well, stating "They told me if I get selected there will be a followup interview".  Alysan reported that a struggle was getting her dog from her ex, who has been difficult to deal with.  Marquita reported that her goal today is to focus on self-care activities such as shopping, and running a  'foot bath'.       Second Therapeutic Activity: Counselor engaged the group in discussion on managing work/life balance today to improve mental health and wellness.  Counselor explained how finding balance between responsibilities at home and work place can be challenging, and lead to increased stress.  Counselor facilitated discussion on what challenges members are currently, or have historically faced.  Counselor also discussed strategies for improving work/life balance while members work on their mental health during treatment.  Some of these included keeping track of time management; creating a list of priorities and scaling importance; setting realistic, measurable goals each day; establishing boundaries; taking care of health needs; and nurturing relationships at home and work for support.  Counselor inquired about areas where members feel they are excelling, as well as areas they could focus on during treatment. Intervention was effective, as evidenced by [name] actively participating in discussion on topic and reporting that she is currently in school to further her education and enter a new, more satisfying career.  She reported that her previous relationship contributed more to burnout than her job due to the partner's unrealistic demands, stating "I gave so much and got barely 10% back in effort".   Niema reported that she experienced several symptoms of burnout, including withdrawing from responsibilities at home, isolating from others, and lacking motivation.  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      Assessment and Plan: Counselor recommends that Exa remain in  IOP treatment to better manage mental health symptoms, ensure stability and pursue  completion of treatment plan goals. Counselor recommends adherence to crisis/safety plan, taking medications as prescribed, and following up with medical professionals if any issues arise.    Follow Up Instructions: Counselor will send Webex link for session tomorrow.  Avaeh was advised to call back or seek an in-person evaluation if the symptoms worsen or if the condition fails to improve as anticipated.   Collaboration of Care:   Medication Management AEB [name] or Hillery Jacks, NP                                          Case Manager AEB Jeri Modena, CNA    Patient/Guardian was advised Release of Information must be obtained prior to any record release in order to collaborate their care with an outside provider. Patient/Guardian was advised if they have not already done so to contact the registration department to sign all necessary forms in order for [place] to release information regarding their care.    Consent: Patient/Guardian gives verbal consent for treatment and assignment of benefits for services provided during this visit. Patient/Guardian expressed understanding and agreed to proceed.   I provided 180 minutes of non-face-to-face time during this encounter.   Noralee Stain, [place], LCAS 06/13/23

## 2023-06-14 ENCOUNTER — Other Ambulatory Visit (HOSPITAL_COMMUNITY): Payer: BC Managed Care – PPO | Admitting: Family

## 2023-06-14 ENCOUNTER — Other Ambulatory Visit (HOSPITAL_COMMUNITY): Payer: Self-pay | Admitting: Family

## 2023-06-14 DIAGNOSIS — F332 Major depressive disorder, recurrent severe without psychotic features: Secondary | ICD-10-CM

## 2023-06-14 DIAGNOSIS — F411 Generalized anxiety disorder: Secondary | ICD-10-CM

## 2023-06-14 MED ORDER — TRAZODONE HCL 100 MG PO TABS: 100.0000 mg | ORAL_TABLET | Freq: Every evening | ORAL | 0 refills | Status: DC | PRN

## 2023-06-14 NOTE — Progress Notes (Signed)
Virtual Visit via Video Note   I connected with Shannon David on 06/14/23 at  9:00 AM EDT by a video enabled telemedicine application and verified that I am speaking with the correct person using two identifiers.   At orientation to the IOP program, Case Manager discussed the limitations of evaluation and management by telemedicine and the availability of in person appointments. The patient expressed understanding and agreed to proceed with virtual visits throughout the duration of the program.   Location:  Patient: Patient Home Provider: Home Office   History of Present Illness: MDD and GAD   Observations/Objective: Check In: Case Manager checked in with all participants to review discharge dates, insurance authorizations, work-related documents and needs from the treatment team regarding medications. Shannon David stated needs and engaged in discussion.    Initial Therapeutic Activity: Counselor facilitated a check-in with Shannon David to assess for safety, sobriety and medication compliance.  Counselor also inquired about Shannon David's current emotional ratings, as well as any significant changes in thoughts, feelings or behavior since previous check in.  Shannon David presented for session on time and was alert, oriented x5, with no evidence or self-report of active SI/HI or A/V H.  Shannon David reported compliance with medication and denied use of alcohol or illicit substances.  Shannon David reported scores of 4/10 for depression, 6/10 for anxiety, and 0/10 for anger/irritability.  Shannon David denied any recent outbursts or panic attacks.  Shannon David reported that a recent success was attending a doctor's appointment and then going shopping at a favorite store yesterday.  Shannon David reported that a struggle was learning that she didn't did the job she interviewed for.  Shannon David reported that her goal today is to work on updating her resume, and then find something to do for self-care.        Second Therapeutic Activity: Counselor  introduced [employer], [place] Chaplain to provide psychoeducation on topic of Grief and Loss with members today.  Shannon David began discussion by checking in with the group about their baseline mood today, general thoughts on what grief means to them and how it has affected them personally in the past.  Shannon David provided information on how the process of grief/loss can differ depending upon one's unique culture, and categories of loss one could experience (i.e. loss of a person, animal, relationship, job, identity, etc).  Shannon David encouraged members to be mindful of how pervasive loss can be, and how to recognize signs which could indicate that this is having an impact on one's overall mental health and wellbeing.  Intervention was effective, as evidenced by Shannon David participating in discussion with speaker on the subject, reporting that she has been grieving the loss of a job and relationship.  She was receptive to supportive feedback from chaplain on healthy strategies for working towards acceptance.    Third Therapeutic Activity: Counselor invited members to participate in peaceful place guided imagery activity today.  Counselor explained how this is a powerful visualization tool which can aid in reducing stress while increasing sense of calm, control, and awareness if practiced regularly.  Counselor informed members beforehand that if they became uncomfortable at any point during activity, they could stop and open their eyes.  Counselor invited members to get comfortable, achieve a relaxing breathing rhythm, close their eyes, and then guided them through process of creating a 'peaceful place' which filled them with safety and calm.  Counselor encouraged members to include sensory details involving vision, sound, touch, smell, and taste which they considered pleasant to enhance experience.  After  practicing in session, counselor invited members to share their opinion on the activity, including whether they were  able to imagine a specific place, what details stood out to them, and how this made them feel during and after.  Intervention was effective, as evidenced by Shannon David successfully participating in activity, and reporting that she was able to imagine spending time with her aunt and uncle at their house when she was a child.  Shannon David reported that she could feel her aunts hug, and was relaxed and loved.  She stated "It felt very real and I was happy".  She expressed interest in practicing this on her own for self-care.    Assessment and Plan: Counselor recommends that Shannon David remain in IOP treatment to better manage mental health symptoms, ensure stability and pursue completion of treatment plan goals. Counselor recommends adherence to crisis/safety plan, taking medications as prescribed, and following up with medical professionals if any issues arise.    Follow Up Instructions: Counselor will send Webex link for session tomorrow.  Shannon David was advised to call back or seek an in-person evaluation if the symptoms worsen or if the condition fails to improve as anticipated.   Collaboration of Care:   Medication Management AEB [name] or Hillery Jacks, NP                                          Case Manager AEB Jeri Modena, CNA    Patient/Guardian was advised Release of Information must be obtained prior to any record release in order to collaborate their care with an outside provider. Patient/Guardian was advised if they have not already done so to contact the registration department to sign all necessary forms in order for [place] to release information regarding their care.    Consent: Patient/Guardian gives verbal consent for treatment and assignment of benefits for services provided during this visit. Patient/Guardian expressed understanding and agreed to proceed.   I provided 180 minutes of non-face-to-face time during this encounter.   Noralee Stain, [place], LCAS 06/14/23

## 2023-06-14 NOTE — Progress Notes (Unsigned)
Virtual Visit via Telephone Note  I connected with Shannon David on 06/15/23 at  9:00 AM EST by telephone and verified that I am speaking with the correct person using two identifiers.  Location: Patient: Home Provider: office   I discussed the limitations, risks, security and privacy concerns of performing an evaluation and management service by telephone and the availability of in person appointments. I also discussed with the patient that there may be a patient responsible charge related to this service. The patient expressed understanding and agreed to proceed.   I discussed the assessment and treatment plan with the patient. The patient was provided an opportunity to ask questions and all were answered. The patient agreed with the plan and demonstrated an understanding of the instructions.   The patient was advised to call back or seek an in-person evaluation if the symptoms worsen or if the condition fails to improve as anticipated.  I provided 15 minutes of non-face-to-face time during this encounter.   Shannon Rack, NP   [facility] MD/PA/NP OP Progress Note  06/14/2023 11:15 AM Byrl Burket  MRN:  [id number]  Chief Complaint:  Shannon David reported " I had a rough last week."  HPI: Eupha is seen and evaluated telephonically.  Reports experiencing suicidal ideations with urged to cut her wrist however states that she made a contract between she and her best friend.  Shannon David reports her best friend removed all sharp objects from her home after picking up up from the emergency department.  She reports multiple stressors that led up to her cutting her arm.  Azarie reports her mood has improved since.    Denying suicidal or homicidal ideations.  Denies auditory or visual hallucinations.  States she has been taking her medications as indicated.  Reports her sleep has improved since her trazodone was increased to 200 mg nightly.  She reports she is on the wait list to see her therapist  more frequently.  Rating her depression 3 out of 10 with 10 being the worst during this assessment.  Will make medications available for refill as patient anticipates discharging 06/21/2023.   [name] patient sounds clear, coherent with  moderate tone and normal pace and  moderated volume.  Denied any safety concerns during this telephonic visit.  Denied paranoia or psychosis.  Content is congruent and relevant.  Patient remained calm throughout this assessment.  Will make medication refills available support encouragement reassurance was provided.   Visit Diagnosis:    ICD-10-CM   1. 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 GAD (generalized anxiety disorder)  F41.1       Past Psychiatric History:   Past Medical History:  Past Medical History:  Diagnosis Date   Anxiety    COVID    History of Clostridium difficile infection 01/2023   History of hypothyroidism    History of PCOS    Major depressive disorder, recurrent severe without psychotic features (HCC)    Passive suicidal ideations 11/11/2022   Rheumatoid arthritis (HCC)    Suicidal behavior with attempted self-injury (HCC) 04/22/2023    Past Surgical History:  Procedure Laterality Date   BUNIONECTOMY Right    FOOT SURGERY Right 08/24/2021    Family Psychiatric History:   Family History:  Family History  Problem Relation Age of Onset   Depression Mother    Bipolar disorder Mother    Hypertension Mother    Clotting disorder Mother    Depression Brother  Rheum arthritis Brother    Lung cancer Paternal Uncle    Cancer Maternal Grandmother        lung    Social History:  Social History   Socioeconomic History   Marital status: Legally Separated    Spouse name: Not on file   Number of children: 0   Years of education: Not on file   Highest education level: High school graduate  Occupational History   Not on file  Tobacco Use   Smoking status: Never     Passive exposure: Never   Smokeless tobacco: Never  Vaping Use   Vaping status: Never Used  Substance and Sexual Activity   Alcohol use: No   Drug use: No   Sexual activity: Not on file  Other Topics Concern   Not on file  Social History Narrative   Left Handed    Lives in a one story home   Drinks Caffeine    Social Drivers of Health   Financial Resource Strain: Not on file  Food Insecurity: Patient Declined (04/21/2023)   Hunger Vital Sign    Worried About Running Out of Food in the Last Year: Patient declined    Ran Out of Food in the Last Year: Patient declined  Transportation Needs: No Transportation Needs (04/21/2023)   PRAPARE - Administrator, Civil Service (Medical): No    Lack of Transportation (Non-Medical): No  Physical Activity: Not on file  Stress: Not on file  Social Connections: Not on file    Allergies:  Allergies  Allergen Reactions   Keflex [Cephalexin] Hives    Metabolic Disorder Labs: No results found for: "HGBA1C", "MPG" No results found for: "PROLACTIN" Lab Results  Component Value Date   CHOL 151 12/28/2022   TRIG 65.0 12/28/2022   HDL 48.50 12/28/2022   CHOLHDL 3 12/28/2022   VLDL 13.0 12/28/2022   LDLCALC 89 12/28/2022   LDLCALC 74 10/18/2022   Lab Results  Component Value Date   TSH 5.062 (H) 11/11/2022    Therapeutic Level Labs: No results found for: "LITHIUM" No results found for: "VALPROATE" No results found for: "CBMZ"  Current Medications: Current Outpatient Medications  Medication Sig Dispense Refill   albuterol (VENTOLIN HFA) 108 (90 Base) MCG/ACT inhaler Inhale 1-2 puffs into the lungs every 6 (six) hours as needed for wheezing or shortness of breath. 1 each 0   Cholecalciferol (VITAMIN D3) 50 MCG (2000 UT) capsule Take 2,000 Units by mouth daily.     folic acid (FOLVITE) 1 MG tablet Take 2 tablets (2 mg total) by mouth daily. 60 tablet 0   gabapentin (NEURONTIN) 300 MG capsule Take 1 capsule (300 mg  total) by mouth 2 (two) times daily. 60 capsule 0   hydroxychloroquine (PLAQUENIL) 200 MG tablet TAKE 1 TABLET BY MOUTH TWICE A DAY MONDAY-FRIDAY 120 tablet 0   hydrOXYzine (VISTARIL) 25 MG capsule Take 1 capsule (25 mg total) by mouth at bedtime and may repeat dose one time if needed. 30 capsule 0   levothyroxine (SYNTHROID) 112 MCG tablet Take 1 tablet (112 mcg total) by mouth daily. 30 tablet 0   methotrexate (RHEUMATREX) 2.5 MG tablet TAKE 8 TABLETS (20 MG TOTAL) BY MOUTH ONCE A WEEK. CAUTION:CHEMOTHERAPY. PROTECT FROM LIGHT. (Patient taking differently: Take 20 mg by mouth once a week. TAKE 8 TABLETS (20 MG TOTAL) BY MOUTH ONCE A WEEK. CAUTION:CHEMOTHERAPY. PROTECT FROM LIGHT. Takes every Wednesday) 96 tablet 0   PARoxetine (PAXIL) 30 MG tablet Take 2 tablets (  60 mg total) by mouth daily. 60 tablet 0   propranolol (INDERAL) 10 MG tablet Take 1 tablet (10 mg total) by mouth 2 (two) times daily. 60 tablet 0   Tocilizumab (ACTEMRA) 80 MG/4ML SOLN injection INFUSE 4MG /KG INTRAVENOUSLY EVERY 4 WEEKS (Body weight 123.9kg) 28 mL 5   traZODone (DESYREL) 100 MG tablet Take 1 tablet (100 mg total) by mouth at bedtime as needed for sleep. 60 tablet 0   No current facility-administered medications for this visit.     Musculoskeletal: Telephonically   Psychiatric Specialty Exam: Review of Systems  Psychiatric/Behavioral:  Negative for sleep disturbance. The patient is not nervous/anxious.   All other systems reviewed and are negative.   There were no vitals taken for this visit.There is no height or weight on file to calculate BMI.  General Appearance: NA  Eye Contact:  NA  Speech:  Clear and Coherent  Volume:  Normal  Mood:  Euthymic  Affect:  Congruent  Thought Process:  Coherent  Orientation:  Full (Time, Place, and Person)  Thought Content: Logical   Suicidal Thoughts:  No  Homicidal Thoughts:  No  Memory:  Immediate;   Good Recent;   Good  Judgement:  Good  Insight:  Fair   Psychomotor Activity:  Normal  Concentration:  Concentration: Good  Recall:  Good  Fund of Knowledge: Good  Language: Good  Akathisia:  No  Handed:  Right  AIMS (if indicated): not done  Assets:  Desire for Improvement  ADL's:  Intact  Cognition: WNL  Sleep:  Fair   Screenings: AUDIT    Flowsheet Row [facility]  Alcohol Use Disorder Identification Test Final Score (AUDIT) 0      GAD-7    Flowsheet Row Counselor from 12/23/2022 in [facility]  Total GAD-7 Score 2      PHQ2-9    Flowsheet Row Counselor from 06/02/2023 in BEHAVIORAL HEALTH INTENSIVE PSYCH Clinical Support from 05/25/2023 in [facility] Counselor from 05/06/2023 in BEHAVIORAL HEALTH PARTIAL HOSPITALIZATION PROGRAM Counselor from 04/28/2023 in BEHAVIORAL HEALTH PARTIAL HOSPITALIZATION PROGRAM Clinical Support from 04/27/2023 in [facility]  PHQ-2 Total Score 3 3 2 2 1   PHQ-9 Total Score 18 14 13 15 4       Flowsheet Row Counselor from 06/02/2023 in BEHAVIORAL HEALTH INTENSIVE PSYCH Counselor from 05/06/2023 in BEHAVIORAL HEALTH PARTIAL HOSPITALIZATION PROGRAM Counselor from 04/28/2023 in BEHAVIORAL HEALTH PARTIAL HOSPITALIZATION PROGRAM  C-SSRS RISK CATEGORY Error: Question 6 not populated Low Risk High Risk        Assessment and Plan:  Continue  Intensive Outpatient hospitalization program  Increased Trazodone 100 mg to 200 mg po at bedtime medication was send to pharmacy    Collaboration of Care: Collaboration of Care: Medication Management AEB refill trazodone 200 mg nightly and Other on the wait list to see a therapist more frequently i.e. once weekly  Patient/Guardian was advised Release of Information must be obtained prior to any record release in order to collaborate their care with an outside provider.  Patient/Guardian was advised if they have not already done so to contact the registration department to sign all necessary forms in order for [place] to release information regarding their care.   Consent: Patient/Guardian gives verbal consent for treatment and assignment of benefits for services provided during this visit. Patient/Guardian expressed understanding and agreed to proceed.  Shannon Rack, NP 06/14/2023, 11:15 AM

## 2023-06-15 ENCOUNTER — Other Ambulatory Visit: Payer: Self-pay | Admitting: Physician Assistant

## 2023-06-15 ENCOUNTER — Telehealth (HOSPITAL_COMMUNITY): Payer: Self-pay | Admitting: Psychiatry

## 2023-06-15 ENCOUNTER — Other Ambulatory Visit (HOSPITAL_COMMUNITY): Payer: BC Managed Care – PPO | Admitting: Psychiatry

## 2023-06-15 DIAGNOSIS — M0579 Rheumatoid arthritis with rheumatoid factor of multiple sites without organ or systems involvement: Secondary | ICD-10-CM

## 2023-06-15 DIAGNOSIS — Z79899 Other long term (current) drug therapy: Secondary | ICD-10-CM

## 2023-06-15 NOTE — Telephone Encounter (Signed)
Last Fill: 03/22/2023  Labs: 04/09/2023 WBC 3.7, Sodium 133, Calcium 8.0  Next Visit: 06/24/2023  Last Visit: 03/22/2023  DX: Rheumatoid arthritis involving multiple sites with positive rheumatoid factor   Current Dose per office note 03/22/2023:  Methotrexate 8 tablets once wkly   Okay to refill Methotrexate?

## 2023-06-16 ENCOUNTER — Other Ambulatory Visit (HOSPITAL_COMMUNITY): Payer: BC Managed Care – PPO | Admitting: Licensed Clinical Social Worker

## 2023-06-16 DIAGNOSIS — F411 Generalized anxiety disorder: Secondary | ICD-10-CM

## 2023-06-16 DIAGNOSIS — F332 Major depressive disorder, recurrent severe without psychotic features: Secondary | ICD-10-CM

## 2023-06-16 NOTE — Progress Notes (Signed)
Virtual Visit via Video Note   I connected with Shannon David on 06/16/23 at  9:00 AM EDT by a video enabled telemedicine application and verified that I am speaking with the correct person using two identifiers.   At orientation to the IOP program, Case Manager discussed the limitations of evaluation and management by telemedicine and the availability of in person appointments. The patient expressed understanding and agreed to proceed with virtual visits throughout the duration of the program.   Location:  Patient: Patient Home Provider: Home Office   History of Present Illness: MDD and GAD   Observations/Objective: Check In: Case Manager checked in with all participants to review discharge dates, insurance authorizations, work-related documents and needs from the treatment team regarding medications. Shannon David stated needs and engaged in discussion.    Initial Therapeutic Activity: Counselor facilitated a check-in with Shannon David to assess for safety, sobriety and medication compliance.  Counselor also inquired about Shannon David's current emotional ratings, as well as any significant changes in thoughts, feelings or behavior since previous check in.  Shannon David presented for session on time and was alert, oriented x5, with no evidence or self-report of active SI/HI or A/V H.  Shannon David reported compliance with medication and denied use of alcohol or illicit substances.  Shannon David reported scores of 2/10 for depression, 4/10 for anxiety, and 0/10 for anger/irritability.  Shannon David denied any recent outbursts or panic attacks.  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  Shannon David reported that a struggle was dealing with a very bad cough, which led her to miss group yesterday in order to recuperate.  Shannon David reported that her goal today is to go to her college to pick up some transcripts.           Second Therapeutic Activity: Counselor introduced topic of assertive communication today.  Counselor shared various handouts with members virtually in group to read along with on the subject.  These handouts defined assertive communication as a communication style in which a person stands up for their own needs and wants, while also taking into consideration the needs and wants of others, without behaving in a passive or aggressive way.  Traits of assertive communicators were highlighted such as using appropriate speaking volume, maintaining eye contact, using confident language, and avoiding interruption.  Members were also provided with tips on how to improve communication, including respecting oneself, expressing thoughts and feelings calmly, and saying "No" when necessary.  Members were given a variety of scenarios where they could practice using these tips to respond in an assertive manner.  Intervention was effective, as evidenced by [employer] participating in discussion on topic, reporting that she has a passive communication style due to traits such as being soft spoken or quiet, looking down or away, not expressing her own needs or wants, lacking confidence, and prioritizing the needs of others.  Shannon David reported that this has led to issues such as becoming a people pleaser, and allowing others to take advantage of her kindness.  Shannon David showed more effective use of assertive communication skills through engagement in roleplay activities.    Assessment and Plan: Counselor recommends that Shannon David remain in IOP treatment to better manage mental health symptoms, ensure stability and pursue completion of treatment plan goals. Counselor recommends adherence to crisis/safety plan, taking medications as prescribed, and following up with medical professionals if any issues arise.  Follow Up Instructions: Counselor will send Webex link for session tomorrow.  Shannon David was advised to call back or seek an  in-person evaluation if the symptoms worsen or if the condition fails to improve as anticipated.   Collaboration of Care:   Medication Management AEB [name] or Hillery Jacks, NP                                          Case Manager AEB Jeri Modena, CNA    Patient/Guardian was advised Release of Information must be obtained prior to any record release in order to collaborate their care with an outside provider. Patient/Guardian was advised if they have not already done so to contact the registration department to sign all necessary forms in order for [place] to release information regarding their care.    Consent: Patient/Guardian gives verbal consent for treatment and assignment of benefits for services provided during this visit. Patient/Guardian expressed understanding and agreed to proceed.   I provided 180 minutes of non-face-to-face time during this encounter.   Noralee Stain, [place], LCAS 06/16/23

## 2023-06-17 ENCOUNTER — Other Ambulatory Visit (HOSPITAL_COMMUNITY): Payer: BC Managed Care – PPO | Admitting: Licensed Clinical Social Worker

## 2023-06-17 DIAGNOSIS — F332 Major depressive disorder, recurrent severe without psychotic features: Secondary | ICD-10-CM

## 2023-06-17 DIAGNOSIS — F411 Generalized anxiety disorder: Secondary | ICD-10-CM

## 2023-06-17 NOTE — Progress Notes (Signed)
Virtual Visit via Video Note   I connected with [name] on 06/17/23 at  9:00 AM EDT by a video enabled telemedicine application and verified that I am speaking with the correct person using two identifiers.   At orientation to the IOP program, Case Manager discussed the limitations of evaluation and management by telemedicine and the availability of in person appointments. The patient expressed understanding and agreed to proceed with virtual visits throughout the duration of the program.   Location:  Patient: Patient Home Provider: Home Office   History of Present Illness: MDD and GAD   Observations/Objective: Check In: Case Manager checked in with all participants to review discharge dates, insurance authorizations, work-related documents and needs from the treatment team regarding medications. Daveona stated needs and engaged in discussion.    Initial Therapeutic Activity: Counselor facilitated a check-in with Attison to assess for safety, sobriety and medication compliance.  Counselor also inquired about Corianne's current emotional ratings, as well as any significant changes in thoughts, feelings or behavior since previous check in.  Mera presented for session on time and was alert, oriented x5, with no evidence or self-report of active SI/HI or A/V H.  Natsumi reported compliance with medication and denied use of alcohol or illicit substances.  Akyah reported scores of 5/10 for depression, 0/10 for anxiety, and 0/10 for anger/irritability.  Velecia denied any recent outbursts or panic attacks.  Teaunna reported that a recent success was spending time with her father yesterday, who helped with a home repair.  Ensley reported that a struggle was having to go to urgent care today in order to pick up medication.  Beila reported that her goal today is to find something to do for self-care, like taking a walk with her dog.        Second Therapeutic Activity: Counselor provided  psychoeducation on subject of boundaries with group members today using a virtual handout.  This handout defined boundaries as the limits and rules that we set for ourselves within relationships, and featured a breakdown of the 3 common categories of boundaries (i.e. porous, rigid, and healthy), along with typical traits specific to each one for easy identification.  It was noted that most people have a mixture of different boundary types depending on setting, person, and culture.  Additional information was provided on the types of boundaries (i.e. physical, intellectual, emotional, sexual, material, and time) within relationships, and what could be considered healthy versus unhealthy. Counselor tasked members with identifying what types of boundaries they presently hold within her own support systems, the collective impact these boundaries have upon their mental health, and changes that could be made in order to more effectively communicate individual mental health needs.  Intervention was effective, as evidenced by [name] actively engaging in discussion on topic, reporting that she has passive boundaries due to traits such as struggling to assert personal values, avoiding conflict by giving into others, having trouble saying "No", letting anyone get close to her, and communicating passively.  Aneri reported that this has led to several consequences, stating "I let people run over me, and people get their way if I don't stand up for myself".  Lillynn reported that she would plan to set healthier boundaries with 'toxic' people in her network that tend to negatively affect her mood, such as her ex who tries to get her anxiety worked up, and coworkers at her job that gossip.       Third Therapeutic Activity: Psycho-educational portion of group was provided by  Virgina Evener, Interior and spatial designer of community education with [employer].  Alexandra provided information on history of her local agency, mission  statement, and the variety of unique services offered which group members might find beneficial to engage in, including both virtual and in-person support groups, as well as peer support program for mentoring.  Alexandra offered time to answer member's questions regarding services and encouraged them to consider utilizing these services to assist in working towards their individual wellness goals.  Intervention effectiveness could not be measured, as client did not participate.      Assessment and Plan: Counselor recommends that Genieve remain in IOP treatment to better manage mental health symptoms, ensure stability and pursue completion of treatment plan goals. Counselor recommends adherence to crisis/safety plan, taking medications as prescribed, and following up with medical professionals if any issues arise.    Follow Up Instructions: Counselor will send Webex link for session tomorrow.  Samanthajo was advised to call back or seek an in-person evaluation if the symptoms worsen or if the condition fails to improve as anticipated.   Collaboration of Care:   Medication Management AEB [name] or Hillery Jacks, NP                                          Case Manager AEB Jeri Modena, CNA    Patient/Guardian was advised Release of Information must be obtained prior to any record release in order to collaborate their care with an outside provider. Patient/Guardian was advised if they have not already done so to contact the registration department to sign all necessary forms in order for [place] to release information regarding their care.    Consent: Patient/Guardian gives verbal consent for treatment and assignment of benefits for services provided during this visit. Patient/Guardian expressed understanding and agreed to proceed.   I provided 180 minutes of non-face-to-face time during this encounter.   Noralee Stain, LCSW, LCAS 06/17/23

## 2023-06-20 ENCOUNTER — Other Ambulatory Visit (HOSPITAL_COMMUNITY): Payer: BC Managed Care – PPO | Admitting: Psychiatry

## 2023-06-20 DIAGNOSIS — F411 Generalized anxiety disorder: Secondary | ICD-10-CM

## 2023-06-20 DIAGNOSIS — F332 Major depressive disorder, recurrent severe without psychotic features: Secondary | ICD-10-CM | POA: Diagnosis not present

## 2023-06-20 NOTE — Progress Notes (Signed)
Virtual Visit via Video Note   I connected with Shannon David on 06/20/23 at  9:00 AM EDT by a video enabled telemedicine application and verified that I am speaking with the correct person using two identifiers.   At orientation to the IOP program, Case Manager discussed the limitations of evaluation and management by telemedicine and the availability of in person appointments. The patient expressed understanding and agreed to proceed with virtual visits throughout the duration of the program.   Location:  Patient: Patient Home Provider: OPT BH Office   History of Present Illness: MDD and GAD   Observations/Objective: Check In: Case Manager checked in with all participants to review discharge dates, insurance authorizations, work-related documents and needs from the treatment team regarding medications. Shannon David stated needs and engaged in discussion.    Initial Therapeutic Activity: Counselor facilitated a check-in with Shannon David to assess for safety, sobriety and medication compliance.  Counselor also inquired about Shannon David's current emotional ratings, as well as any significant changes in thoughts, feelings or behavior since previous check in.  Shannon David presented for session on time and was alert, oriented x5, with no evidence or self-report of active SI/HI or A/V H.  Shannon David reported compliance with medication and denied use of alcohol or illicit substances.  Shannon David reported scores of 9/10 for depression, 9/10 for anxiety, and 0/10 for anger/irritability.  Shannon David denied any recent outbursts or panic attacks.  Shannon David reported that a recent success was getting out of the house on Saturday to go to [facility] and pick up new canvases to do art on.  Shannon David reported that a struggle was having a bad weekend emotionally, which she believes was triggered by feelings of loneliness and grief.  Shannon David reported that her goal today is to take a shower and then spend time with one of her parents.      Second Therapeutic Activity: Counselor introduced topic of anger management today.  Counselor virtually shared a handout with members on this subject featuring a variety of coping skills, and facilitated discussion on these approaches.  Examples included raising awareness of anger triggers, practicing deep breathing, keeping an anger log to better understand episodes, using diversion activities to distract oneself for 30 minutes, taking a time out when necessary, and being mindful of warning signs tied to thoughts or behavior.  Counselor inquired about which techniques group members have used before, what has proved to be helpful, what their unique warning signs might be, as well as what they will try out in the future to assist with de-escalation.  Intervention effectiveness could not be measured, as Shannon David did not participate in discussion on topic.    Assessment and Plan: Counselor recommends that Shannon David remain in IOP treatment to better manage mental health symptoms, ensure stability and pursue completion of treatment plan goals. Counselor recommends adherence to crisis/safety plan, taking medications as prescribed, and following up with medical professionals if any issues arise.    Follow Up Instructions: Counselor will send Webex link for session tomorrow.  Shannon David was advised to call back or seek an in-person evaluation if the symptoms worsen or if the condition fails to improve as anticipated.   Collaboration of Care:   Medication Management AEB [name] or Hillery Jacks, NP                                          Case Manager AEB  Jeri Modena, CNA    Patient/Guardian was advised Release of Information must be obtained prior to any record release in order to collaborate their care with an outside provider. Patient/Guardian was advised if they have not already done so to contact the registration department to sign all necessary forms in order for [place] to release information regarding their care.     Consent: Patient/Guardian gives verbal consent for treatment and assignment of benefits for services provided during this visit. Patient/Guardian expressed understanding and agreed to proceed.   I provided 180 minutes of non-face-to-face time during this encounter.   Noralee Stain, [place], LCAS 06/20/23

## 2023-06-20 NOTE — Patient Instructions (Addendum)
D:  Patient completed virtual MH-IOP.  A:  Discharge on 06-21-23.  Follow up with [name] on 07-25-23 @ 8:30 a.m (in office) and Ambrose Mantle, LCSW on 08-17-23 @ 8 a.m (in person).  [facility] will have an aftercare group starting in Jan. 2025 (according to [place]).  Your name is on the list.  Strongly encourage support groups through [facility] [phone].  R:  Patient receptive.

## 2023-06-21 ENCOUNTER — Other Ambulatory Visit (HOSPITAL_COMMUNITY): Payer: BC Managed Care – PPO | Admitting: Family

## 2023-06-21 DIAGNOSIS — F332 Major depressive disorder, recurrent severe without psychotic features: Secondary | ICD-10-CM

## 2023-06-21 DIAGNOSIS — F411 Generalized anxiety disorder: Secondary | ICD-10-CM

## 2023-06-21 NOTE — Progress Notes (Addendum)
Virtual Visit via Video Note   I connected with Shannon David on 06/21/23 at  9:00 AM EDT by a video enabled telemedicine application and verified that I am speaking with the correct person using two identifiers.   At orientation to the IOP program, Case Manager discussed the limitations of evaluation and management by telemedicine and the availability of in person appointments. The patient expressed understanding and agreed to proceed with virtual visits throughout the duration of the program.   Location:  Patient: Patient Home Provider: Home Office   History of Present Illness: MDD and GAD   Observations/Objective: Check In: Case Manager checked in with all participants to review discharge dates, insurance authorizations, work-related documents and needs from the treatment team regarding medications. Shannon David stated needs and engaged in discussion.    Initial Therapeutic Activity: Counselor facilitated a check-in with Shannon David to assess for safety, sobriety and medication compliance.  Counselor also inquired about Shannon David's current emotional ratings, as well as any significant changes in thoughts, feelings or behavior since previous check in.  Shannon David presented for session on time and was alert, oriented x5, with no evidence or self-report of active SI/HI or A/V H.  Shannon David reported compliance with medication and denied use of alcohol or illicit substances.  Shannon David reported scores of 8/10 for depression, 8/10 for anxiety, and 0/10 for anger/irritability.  Shannon David denied any recent outbursts or panic attacks.  Shannon David reported that a recent success was spending time with her dad yesterday for distraction since she didn't like being in her apartment alone.  Shannon David reported that her goal today is to think of something to do with her father for Christmas Eve as a distraction.         Second Therapeutic Activity: Counselor introduced topic of creating mental health maintenance plan today.  Counselor  provided handout on subject to members, which stressed the importance of maintaining one's mental health in a similar way to using diet and exercise to ensure physical health.  Counselor walked members through process of identifying triggers which could worsen symptoms, including specific people, places, and things one needs to avoid.  Members were also tasked with identifying warning signs such as thoughts, feelings, or behaviors which could indicate mental health is at increased risk.  Counselor also facilitated conversation on self-care activities and coping strategies which members have previously utilized in the past, are currently using in daily routine, or plan to use soon to assist with managing problems or symptoms when/if they appear.  Counselor encouraged members to revisit their maintenance plan often and make changes as needed to ensure day to day stability.  Intervention effectiveness could not be measured, as client did not participate in discussion.    Assessment and Plan: Shannon David has completed MHIOP and will be discharged today.  Counselor recommends adherence to crisis/safety plan, taking medications as prescribed, and following up with medical professionals if any issues arise.    Follow Up Instructions: Shannon David was advised to call back or seek an in-person evaluation if the symptoms worsen or if the condition fails to improve as anticipated.   Collaboration of Care:   Medication Management AEB [name] or Hillery Jacks, NP                                          Case Manager AEB Jeri Modena, CNA    Patient/Guardian was advised Release of Information  must be obtained prior to any record release in order to collaborate their care with an outside provider. Patient/Guardian was advised if they have not already done so to contact the registration department to sign all necessary forms in order for [place] to release information regarding their care.    Consent: Patient/Guardian gives verbal  consent for treatment and assignment of benefits for services provided during this visit. Patient/Guardian expressed understanding and agreed to proceed.   I provided 180 minutes of non-face-to-face time during this encounter.   Noralee Stain, [place], LCAS 06/21/23

## 2023-06-21 NOTE — Progress Notes (Signed)
Virtual Visit via Video Note  I connected with Oneika Kelm on @TODAY @ at  9:00 AM EST by a video enabled telemedicine application and verified that I am speaking with the correct person using two identifiers.  Location: Patient: at home Provider: at office   I discussed the limitations of evaluation and management by telemedicine and the availability of in person appointments. The patient expressed understanding and agreed to proceed.  I discussed the assessment and treatment plan with the patient. The patient was provided an opportunity to ask questions and all were answered. The patient agreed with the plan and demonstrated an understanding of the instructions.   The patient was advised to call back or seek an in-person evaluation if the symptoms worsen or if the condition fails to improve as anticipated.  I provided 30 minutes of non-face-to-face time during this encounter.   Jeri Modena, M.Ed,CNA   Patient ID: Margree Lackman, female   DOB: Aug 29, 1990, 32 y.o.   MRN: [id number] D:  As per previous CCA states "Adra Minieri is a 32yo female referred to [facility] following d/c from [facility] for SI with a plan and intent. She cites her stressors as work, school, and most recently living with her husband and taking care of his children. She reports since separating from her husband, she is feeling more hopeful and less stressed. She denies SI at this time. She reports decreased ADLs, stating that prior to her hospitalization her hygiene was significantly decreased because she was sleeping more. She currently sees a therapist and psychiatrist, both within this office, and has had med man over the past couple of years. She denies other tx hx and hospitalizations. She denies suicide attempts, HI, AVH, substance use, and states there are no firearms in her home. She reports being diagnosed with MDD and GAD and endorses NSSI prior to her inpatient admission, but denies previous history of NSSI. She cites  her parents and friends as her supports and states she made new friends while at [facility]. She reports her mother has depression and bipolar disorder and her brother has depression. She states she is currently staying with her father and will be moving into her own apartment next week. She reports medical hx of rheumatioid arthritis, IBS, and underactive thyroid.    Pt stepped down from virtual PHP to virtual MH-IOP today.  Denies any SI/HI or A/V hallucinations.  Pt admits to feeling sad last Friday and having a "mini meltdown."  Pt states she has yet to receive a check from short term disability.  "I noticed others Christmas shopping last Friday and I had been crying all day.  I went home and started cutting." Pt reports not feeling that way at this time.  Discussed safety options at length in case sx's return.  Pt attended a total # of 12 virtual MH-IOP days.  Pt reports although the program was helpful; she's feeling very overwhelmed.  On a scale of 1-10 (10 being the worst); pt rates her anxiety at a 7 and depression at a 9.  "I feel really lonely since the weekend.  Nothing happened over the weekend.  I just feel like I don't belong here.  It's hard to explain.  I just don't want to be here, at my place."  Inquired what helps pt whenever she's feeling that way?  Pt states being around people helps.  Discussed safety options at length with pt.  Strongly encouraged pt to call [phone] or go to [facility]  Pt states she  will pack up a few things and leave her place and go straight to her father's home to stay.  Pt denies SI/HI or A/V hallucinations. A:  Discharge pt.  Provided pt with support.  Informed Hillery Jacks, NP.  Encouraged pt to call the front office every other day to see if there's a cancellation for a sooner appt with [name] or [facility].  F/U with [name] on 07-25-23 @ 8:30 a.m (in office); Ambrose Mantle, LCSW on 08-17-23 @ 8 a.m (in office).  *Pt is on a cancellation list.  Also, mentioned the  aftercare group with [facility], that will start in January 2025.  Pt's name is on that list also.  Pt was advised of ROI must be obtained prior to any records release in order to collaborate her care with an outside provider.  Pt was advised if she has not already done so to contact the front desk to sign all necessary forms in order for MH-IOP to release info re: her care.  Consent:  Pt gives verbal consent for tx and assignment of benefits for services provided during this telehealth group process.  Pt expressed understanding and agreed to proceed. Collaboration of care:  Collaborate with Dr. Lamar Sprinkles AEB,  Hillery Jacks, NP AEB; Noralee Stain, LCSW AEB, Ambrose Mantle AEB and Dr. Iona Coach.  Strongly encouraged support groups through [facility] or DBT.  R:  Pt receptive.  Jeri Modena, M.Ed,CNA

## 2023-06-21 NOTE — Progress Notes (Signed)
Virtual Visit via Video Note  I connected with Shannon David on 06/21/23 at  9:00 AM EST by a video enabled telemedicine application and verified that I am speaking with the correct person using two identifiers.  Location: Patient: Home Provider: Office   I discussed the limitations of evaluation and management by telemedicine and the availability of in person appointments. The patient expressed understanding and agreed to proceed.   I discussed the assessment and treatment plan with the patient. The patient was provided an opportunity to ask questions and all were answered. The patient agreed with the plan and demonstrated an understanding of the instructions.   The patient was advised to call back or seek an in-person evaluation if the symptoms worsen or if the condition fails to improve as anticipated.  I provided 15 minutes of non-face-to-face time during this encounter.   Shannon Rack, NP    Health Intensive Outpatient Program Discharge Summary  Shannon David [id number]  Admission date: 06/07/2023 Discharge date: 06/21/2023  Reason for admission:  [name] is stepping down from [facility]- Per admission assessment note: "[name] is a 32 y.o. female with PMH of psychiatric history of MDD, suicidal thoughts and a medical history of rheumatoid arthritis, uncontrolled by current medications who was admitted to [facility] following inpatient psych admission for further management of depression and anxiety in the setting of separation from her husband."   Progress in Program Toward Treatment Goals: Progressing patient attended and participated in daily group session with sporadic participation.  Patient was seen and evaluated at discharge.  She presents flat guarded and very minimal.  Reports she has had a " bad couple of days."  Patient did not elaborate on any stressors.  Discussed medication compliance.  She denied suicidal or homicidal ideations.  Denies auditory or  visual hallucinations.  Reports overall the program was helpful with learning coping skills.  Patient to follow-up with outpatient providers and consideration for following up with group therapy sessions as was discussed by her therapist plan of care.Doree Fudge.   -This provider made multiple attempts to contact pharmacy related to refill on trazodone? -Clarification needed last follow-up was December 27 at 8:15 AM.   Progress (rationale): Take all of you medications as prescribed by your mental healthcare provider.  Report any adverse effects and reactions from your medications to your outpatient provider promptly.  Do not engage in alcohol and or illegal drug use while on prescription medicines. Keep all scheduled appointments. This is to ensure that you are getting refills on time and to avoid any interruption in your medication.  If you are unable to keep an appointment call to reschedule.  Be sure to follow up with resources and follow ups given. In the event of worsening symptoms call the crisis hotline, 911, and or go to the nearest emergency department for appropriate evaluation and treatment of symptoms. Follow-up with your primary care provider for your medical issues, concerns and or health care needs.    Collaboration of Care: Medication Management AEB medication was refilled at discharged  Patient/Guardian was advised Release of Information must be obtained prior to any record release in order to collaborate their care with an outside provider. Patient/Guardian was advised if they have not already done so to contact the registration department to sign all necessary forms in order for [place] to release information regarding their care.   Consent: Patient/Guardian gives verbal consent for treatment and assignment of benefits for services provided during this visit. Patient/Guardian expressed  understanding and agreed to proceed.   Shannon Rack, NP 06/21/2023

## 2023-06-22 ENCOUNTER — Other Ambulatory Visit (HOSPITAL_COMMUNITY): Payer: Self-pay | Admitting: Family

## 2023-06-23 ENCOUNTER — Telehealth (HOSPITAL_COMMUNITY): Payer: Self-pay | Admitting: Licensed Clinical Social Worker

## 2023-06-23 ENCOUNTER — Ambulatory Visit: Payer: BC Managed Care – PPO | Admitting: Physician Assistant

## 2023-06-23 ENCOUNTER — Ambulatory Visit: Payer: BC Managed Care – PPO

## 2023-06-23 VITALS — BP 108/73 | HR 72 | Temp 98.2°F | Resp 20 | Ht 62.0 in | Wt 276.2 lb

## 2023-06-23 DIAGNOSIS — M0579 Rheumatoid arthritis with rheumatoid factor of multiple sites without organ or systems involvement: Secondary | ICD-10-CM | POA: Diagnosis not present

## 2023-06-23 MED ORDER — ACETAMINOPHEN 325 MG PO TABS
650.0000 mg | ORAL_TABLET | Freq: Once | ORAL | Status: AC
  Administered 2023-06-23: 650 mg via ORAL
  Filled 2023-06-23: qty 2

## 2023-06-23 MED ORDER — SODIUM CHLORIDE 0.9 % IV SOLN
4.0000 mg/kg | Freq: Once | INTRAVENOUS | Status: AC
  Administered 2023-06-23: 490 mg via INTRAVENOUS
  Filled 2023-06-23: qty 24.5

## 2023-06-23 MED ORDER — DIPHENHYDRAMINE HCL 25 MG PO CAPS
25.0000 mg | ORAL_CAPSULE | Freq: Once | ORAL | Status: AC
  Administered 2023-06-23: 25 mg via ORAL
  Filled 2023-06-23: qty 1

## 2023-06-23 NOTE — Progress Notes (Signed)
Diagnosis: Rheumatoid Arthritis  Provider:  Chilton Greathouse MD  Procedure: IV Infusion  IV Type: Peripheral, IV Location: L Antecubital  Actemra (Tocilizumab), Dose: 490  Infusion Start Time: 1056  Infusion Stop Time: 1203  Post Infusion IV Care: Peripheral IV Discontinued  Discharge: Condition: Good, Destination: Home . AVS Declined  Performed by:  Rico Ala, LPN

## 2023-06-24 ENCOUNTER — Ambulatory Visit (HOSPITAL_COMMUNITY): Payer: BC Managed Care – PPO

## 2023-06-24 ENCOUNTER — Ambulatory Visit: Payer: BC Managed Care – PPO | Admitting: Physician Assistant

## 2023-06-24 DIAGNOSIS — M170 Bilateral primary osteoarthritis of knee: Secondary | ICD-10-CM

## 2023-06-24 DIAGNOSIS — Z8639 Personal history of other endocrine, nutritional and metabolic disease: Secondary | ICD-10-CM

## 2023-06-24 DIAGNOSIS — Z8742 Personal history of other diseases of the female genital tract: Secondary | ICD-10-CM

## 2023-06-24 DIAGNOSIS — Z79899 Other long term (current) drug therapy: Secondary | ICD-10-CM

## 2023-06-24 DIAGNOSIS — Z8701 Personal history of pneumonia (recurrent): Secondary | ICD-10-CM

## 2023-06-24 DIAGNOSIS — M0579 Rheumatoid arthritis with rheumatoid factor of multiple sites without organ or systems involvement: Secondary | ICD-10-CM

## 2023-06-24 DIAGNOSIS — M069 Rheumatoid arthritis, unspecified: Secondary | ICD-10-CM

## 2023-06-24 DIAGNOSIS — M65341 Trigger finger, right ring finger: Secondary | ICD-10-CM

## 2023-06-24 DIAGNOSIS — Z8659 Personal history of other mental and behavioral disorders: Secondary | ICD-10-CM

## 2023-06-24 MED ORDER — TRAZODONE HCL 100 MG PO TABS: 100.0000 mg | ORAL_TABLET | Freq: Every evening | ORAL | 0 refills | Status: DC | PRN

## 2023-06-24 NOTE — Progress Notes (Signed)
Trazdone was refilled with 90 day supply, per insurance request.

## 2023-06-24 NOTE — Addendum Note (Signed)
Addended by: [name] on: 06/24/2023 08:55 AM   Modules accepted: Orders

## 2023-06-27 ENCOUNTER — Ambulatory Visit (HOSPITAL_COMMUNITY): Payer: BC Managed Care – PPO

## 2023-06-27 ENCOUNTER — Ambulatory Visit (HOSPITAL_COMMUNITY): Payer: BC Managed Care – PPO | Admitting: Clinical

## 2023-06-27 NOTE — Psych (Signed)
Virtual Visit via Video Note  I connected with Shannon David on 05/30/23 at  9:00 AM EST by a video enabled telemedicine application and verified that I am speaking with the correct person using two identifiers.  Location: Patient: patient home Provider: clinical home office   I discussed the limitations of evaluation and management by telemedicine and the availability of in person appointments. The patient expressed understanding and agreed to proceed.  I discussed the assessment and treatment plan with the patient. The patient was provided an opportunity to ask questions and all were answered. The patient agreed with the plan and demonstrated an understanding of the instructions.   The patient was advised to call back or seek an in-person evaluation if the symptoms worsen or if the condition fails to improve as anticipated.  Pt was provided 240 minutes of non-face-to-face time during this encounter.   Donia Guiles, LCSW   [facility] PHP THERAPIST PROGRESS NOTE  Shannon David [id number]  Session Time: 9:00 - 10:00  Participation Level: Active  Behavioral Response: CasualAlertDepressed  Type of Therapy: Group Therapy  Treatment Goals addressed: Coping  Progress Towards Goals: Progressing  Interventions: CBT, DBT, Supportive, and Reframing  Summary: Shannon David is a 32 y.o. female who presents with depression and anxiety symptoms.  Clinician led check-in regarding current stressors and situation, and review of patient completed daily inventory. Clinician utilized active listening and empathetic response and validated patient emotions. Clinician facilitated processing group on pertinent issues.?    Therapist Response:  Patient arrived within time allowed. Patient rates her mood at a 1 on a scale of 1-10 with 10 being best. Pt states she feels "not good." Pt states she slept 4.5 hours and ate 1x. Pt reports mood has been low, with poor energy over the long weekend. Pt  states experiencing SI on Friday evening and reports no plan but not feeling in control of herself. Pt states self harm via cutting and denies cuts requiring medical attention. Pt states she began to drive to [facility] however turned around and went to her dad's house instead. Pt states she stayed with her dad for 2 days and felt safer there. Pt reports her new house is "too quiet" and she feels "lonely" there. Pt reports she told her dad about her SI and SH and he removed access to sharps and was supportive. Cln highlighted pt's responsiveness to her feelings and efforts to keep herself safe. Pt denies current SI/SH and agrees to return to her dad' house for a few more days to increase stabilization. Pt visibly improves with group support. Patient able to process. Patient engaged in discussion.          Session Time: 10:00 am - 11:00 am   Participation Level: Active   Behavioral Response: CasualAlertDepressed   Type of Therapy: Group Therapy   Treatment Goals addressed: Coping   Progress Towards Goals: Progressing   Interventions: CBT, DBT, Solution Focused, Strength-based, Supportive, and Reframing   Therapist Response: Cln led processing group for pt's current struggles. Group members shared stressors and provided support and feedback. Cln brought in topics of boundaries, healthy relationships, and unhealthy thought processes to inform discussion.    Therapist Response: Pt able to process and provide support to group.            Session Time: 11:00 -12:00   Participation Level: Active   Behavioral Response: CasualAlertDepressed   Type of Therapy: Group Therapy   Treatment Goals addressed: Coping   Progress Towards Goals:  Progressing   Interventions: CBT, DBT, Solution Focused, Strength-based, Supportive, and Reframing   Summary: Cln led discussion on non-suicidal self-harm thoughts. Cln provided context for self-harm thoughts as intrusive thoughts in CBT framework. Cln  discussed how to apply mindfulness techniques and coping skill substitutions to manage self-harm thoughts. Group discussed their experiences with self-harm thoughts and how they can handle them in the future.    Therapist Response: Pt engaged in discussion and reports ways they can apply what was discussed.           Session Time: 12:00 -1:00   Participation Level: Active   Behavioral Response: CasualAlertDepressed   Type of Therapy: Group therapy   Treatment Goals addressed: Coping   Progress Towards Goals: Progressing   Interventions: CBT, DBT, Solution Focused, Strength-based, Supportive, and Reframing   Summary: 12:00 - 12:50: Cln led discussion on CBT thinking error: personalization. Cln drew connection between personalizing thoughts to increased self conciousness and lower self esteem.  Group discussed struggled with personalizing and created reframing statements.  12:50 - 1:00 Clinician assessed for immediate needs, medication compliance and efficacy, and safety concerns.   Therapist Response: 12:00 - 12:50: Pt engaged in discussion and reports understanding of personalization.  12:50 - 1:00 pm: At check-out, patient reports no immediate concerns. Patient demonstrates progress as evidenced by continued engagement and responsiveness to treatment. Patient denies SI/HI/self-harm thoughts at the end of group.    Suicidal/Homicidal: Nowithout intent/plan  Plan: Pt will continue in PHP while working to decrease depression and anxiety symptoms, increase emotion regulation, and increase ability to manage symptoms in a healthy manner.   Collaboration of Care: Medication Management AEB C Cosby  Patient/Guardian was advised Release of Information must be obtained prior to any record release in order to collaborate their care with an outside provider. Patient/Guardian was advised if they have not already done so to contact the registration department to sign all necessary forms in  order for [place] to release information regarding their care.   Consent: Patient/Guardian gives verbal consent for treatment and assignment of benefits for services provided during this visit. Patient/Guardian expressed understanding and agreed to proceed.   Diagnosis: Severe episode of recurrent major depressive disorder, without psychotic features (HCC) [F33.2]    1. Severe episode of recurrent major depressive disorder, without psychotic features (HCC)   2. GAD (generalized anxiety disorder)      Donia Guiles, LCSW

## 2023-06-28 ENCOUNTER — Ambulatory Visit (HOSPITAL_COMMUNITY): Payer: BC Managed Care – PPO

## 2023-06-28 NOTE — Progress Notes (Signed)
Virtual Visit via Video Note  I connected with Shannon David on 06/02/23 at  9:00 AM EST by a video enabled telemedicine application and verified that I am speaking with the correct person using two identifiers.  Location: Patient: patient home Provider: clinical home office   I discussed the limitations of evaluation and management by telemedicine and the availability of in person appointments. The patient expressed understanding and agreed to proceed.  I discussed the assessment and treatment plan with the patient. The patient was provided an opportunity to ask questions and all were answered. The patient agreed with the plan and demonstrated an understanding of the instructions.   The patient was advised to call back or seek an in-person evaluation if the symptoms worsen or if the condition fails to improve as anticipated.  I provided 180 minutes of non-face-to-face time during this encounter.   Shannon Guiles, LCSW     Daily Group Progress Note   Program: IOP   Group Time: 9:00 - 10:30   Participation Level: Active   Behavioral Response: Appropriate and Sharing   Type of Therapy:  Group Therapy   Summary of Progress: Clinician led check-in regarding current stressors and situation, and review of patient completed daily inventory. Clinician utilized active listening and empathetic response and validated patient emotions. Clinician facilitated processing group on pertinent issues.?    Patient rates her mood at a 6.5 on a scale of 1-10 with 10 being best. Pt states she feels "good." Pt states she slept 7 hours and ate 2x. Pt reports she finally got her paycheck and was able to get necessities. Pt states this is a big reducer in stress. Patient able to process. Patient engaged in discussion.    Progress Towards Goals: Progressing     Group Time: 10:30 - 12:00   Participation Level:  Active   Behavioral Response: Appropriate and Sharing   Type of Therapy: Group  Therapy   Summary of Progress: Cln introduced topic of stress management and the model of the "4 A's of stress management:" avoid, alter, accept, and adapt. Group members worked through Engineer, structural and discussed barriers to utilizing the 4 A's for stressors.    Pt engaged in discussion and reports understanding of how to utilize the 4 A's.    Progress Towards Goals: Progressing     Shannon Guiles, LCSW

## 2023-06-30 ENCOUNTER — Ambulatory Visit (HOSPITAL_COMMUNITY): Payer: BC Managed Care – PPO

## 2023-07-01 ENCOUNTER — Other Ambulatory Visit (HOSPITAL_COMMUNITY): Payer: BC Managed Care – PPO

## 2023-07-19 NOTE — Progress Notes (Deleted)
 Office Visit Note  Patient: Shannon David             Date of Birth: June 22, 1991           MRN: [id number]             PCP: Nestor Elston NOVAK, NP Referring: Nestor Elston NOVAK, NP Visit Date: 08/02/2023 Occupation: @GUAROCC @  Subjective:    History of Present Illness: Shannon David is a 33 y.o. female with history of seropositive rheumatoid arthritis.  Patient remains on IV Actemra -first infusion on 11/30/2022, Plaquenil  200 mg 1 tab BID Monday-Friday (restarted 10/2021), Methotrexate  8 tablets once wkly, and folic acid  2 mg daily.    CBC and CMP updated on 07/21/23.  Lipid panel updated on 07/21/23.  TB gold negative on 10/18/22 Discussed the importance of holding actemra  and methotrexate  if she develops signs or symptoms of an infection and to resume once the infection has completely cleared.   Activities of Daily Living:  Patient reports morning stiffness for *** {minute/hour:19697}.   Patient {ACTIONS;DENIES/REPORTS:21021675::Denies} nocturnal pain.  Difficulty dressing/grooming: {ACTIONS;DENIES/REPORTS:21021675::Denies} Difficulty climbing stairs: {ACTIONS;DENIES/REPORTS:21021675::Denies} Difficulty getting out of chair: {ACTIONS;DENIES/REPORTS:21021675::Denies} Difficulty using hands for taps, buttons, cutlery, and/or writing: {ACTIONS;DENIES/REPORTS:21021675::Denies}  No Rheumatology ROS completed.   PMFS History:  Patient Active Problem List   Diagnosis Date Noted   Severe recurrent major depression without psychotic features (HCC) 05/06/2023   GAD (generalized anxiety disorder) 04/22/2023   MDD (major depressive disorder), recurrent episode, moderate (HCC) 04/21/2023   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   Major depressive disorder, recurrent episode, moderate degree (HCC) 11/11/2022   Pain in left knee 06/04/2021   History of PCOS 02/21/2017   High risk medication use 09/21/2016   Rheumatoid arthritis involving multiple sites with positive rheumatoid  factor (HCC) 09/21/2016   History of hypothyroidism 09/21/2016   History of depression 09/21/2016    Past Medical History:  Diagnosis Date   Anxiety    COVID    History of Clostridium difficile infection 01/2023   History of hypothyroidism    History of PCOS    Major depressive disorder, recurrent severe without psychotic features (HCC)    Passive suicidal ideations 11/11/2022   Rheumatoid arthritis (HCC)    Suicidal behavior with attempted self-injury (HCC) 04/22/2023    Family History  Problem Relation Age of Onset   Depression Mother    Bipolar disorder Mother    Hypertension Mother    Clotting disorder Mother    Depression Brother    Rheum arthritis Brother    Lung cancer Paternal Uncle    Cancer Maternal Grandmother        lung   Past Surgical History:  Procedure Laterality Date   BUNIONECTOMY Right    FOOT SURGERY Right 08/24/2021   Social History   Social History Narrative   Left Handed    Lives in a one story home   Drinks Caffeine    Immunization History  Administered Date(s) Administered   PFIZER(Purple Top)SARS-COV-2 Vaccination 10/23/2021     Objective: Vital Signs: There were no vitals taken for this visit.   Physical Exam Vitals and nursing note reviewed.  Constitutional:      Appearance: She is well-developed.  HENT:     Head: Normocephalic and atraumatic.  Eyes:     Conjunctiva/sclera: Conjunctivae normal.  Cardiovascular:     Rate and Rhythm: Normal rate and regular rhythm.     Heart sounds: Normal heart sounds.  Pulmonary:     Effort: Pulmonary effort is  normal.     Breath sounds: Normal breath sounds.  Abdominal:     General: Bowel sounds are normal.     Palpations: Abdomen is soft.  Musculoskeletal:     Cervical back: Normal range of motion.  Lymphadenopathy:     Cervical: No cervical adenopathy.  Skin:    General: Skin is warm and dry.     Capillary Refill: Capillary refill takes less than 2 seconds.  Neurological:      Mental Status: She is alert and oriented to person, place, and time.  Psychiatric:        Behavior: Behavior normal.      Musculoskeletal Exam: ***  CDAI Exam: CDAI Score: -- Patient Global: --; Provider Global: -- Swollen: --; Tender: -- Joint Exam 08/02/2023   No joint exam has been documented for this visit   There is currently no information documented on the homunculus. Go to the Rheumatology activity and complete the homunculus joint exam.  Investigation: No additional findings.  Imaging: No results found.  Recent Labs: Lab Results  Component Value Date   WBC 3.7 (L) 04/09/2023   HGB 12.5 04/09/2023   PLT 226 04/09/2023   NA 133 (L) 04/09/2023   K 3.7 04/09/2023   CL 104 04/09/2023   CO2 22 04/09/2023   GLUCOSE 90 04/09/2023   BUN 15 04/09/2023   CREATININE 0.60 04/09/2023   BILITOT 0.8 04/09/2023   ALKPHOS 55 04/09/2023   AST 30 04/09/2023   ALT 44 04/09/2023   PROT 6.9 04/09/2023   ALBUMIN 4.0 04/09/2023   CALCIUM 8.0 (L) 04/09/2023   GFRAA 134 07/18/2020   QFTBGOLD Negative 08/13/2016   QFTBGOLDPLUS NEGATIVE 10/18/2022    Speciality Comments: PLQ Eye Exam: 10/19/2022 WNL @ [facility] Follow up in 12 months.   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  No orders of the defined types were placed in this encounter.   Face-to-face time spent with patient was *** minutes. Greater than 50% of time was spent in counseling and coordination of care.  Follow-Up Instructions: No follow-ups on file.   Waddell CHRISTELLA Craze, PA-C  Note - This record has been created using Dragon software.  Chart creation errors have been sought, but may not always  have been located. Such creation errors do not reflect on  the standard of medical care.

## 2023-07-21 ENCOUNTER — Ambulatory Visit: Payer: BC Managed Care – PPO

## 2023-07-21 ENCOUNTER — Other Ambulatory Visit: Payer: BC Managed Care – PPO

## 2023-07-21 VITALS — BP 113/75 | HR 58 | Temp 97.8°F | Resp 20 | Ht 62.0 in | Wt 281.0 lb

## 2023-07-21 DIAGNOSIS — M0579 Rheumatoid arthritis with rheumatoid factor of multiple sites without organ or systems involvement: Secondary | ICD-10-CM | POA: Diagnosis not present

## 2023-07-21 LAB — CBC WITH DIFFERENTIAL/PLATELET
Basophils Absolute: 0 10*3/uL (ref 0.0–0.1)
Basophils Relative: 0.6 % (ref 0.0–3.0)
Eosinophils Absolute: 0.1 10*3/uL (ref 0.0–0.7)
Eosinophils Relative: 1.1 % (ref 0.0–5.0)
HCT: 38.9 % (ref 36.0–46.0)
Hemoglobin: 13.3 g/dL (ref 12.0–15.0)
Lymphocytes Relative: 15.6 % (ref 12.0–46.0)
Lymphs Abs: 1 10*3/uL (ref 0.7–4.0)
MCHC: 34.2 g/dL (ref 30.0–36.0)
MCV: 97.2 fL (ref 78.0–100.0)
Monocytes Absolute: 0.7 10*3/uL (ref 0.1–1.0)
Monocytes Relative: 11.6 % (ref 3.0–12.0)
Neutro Abs: 4.4 10*3/uL (ref 1.4–7.7)
Neutrophils Relative %: 71.1 % (ref 43.0–77.0)
Platelets: 234 10*3/uL (ref 150.0–400.0)
RBC: 4 Mil/uL (ref 3.87–5.11)
RDW: 13.4 % (ref 11.5–15.5)
WBC: 6.2 10*3/uL (ref 4.0–10.5)

## 2023-07-21 LAB — COMPREHENSIVE METABOLIC PANEL
ALT: 13 U/L (ref 0–35)
AST: 16 U/L (ref 0–37)
Albumin: 4.2 g/dL (ref 3.5–5.2)
Alkaline Phosphatase: 56 U/L (ref 39–117)
BUN: 14 mg/dL (ref 6–23)
CO2: 25 meq/L (ref 19–32)
Calcium: 9 mg/dL (ref 8.4–10.5)
Chloride: 103 meq/L (ref 96–112)
Creatinine, Ser: 0.64 mg/dL (ref 0.40–1.20)
GFR: 115.22 mL/min (ref >60.00)
Glucose, Bld: 102 mg/dL — ABNORMAL HIGH (ref 70–99)
Potassium: 4 meq/L (ref 3.5–5.1)
Sodium: 134 meq/L — ABNORMAL LOW (ref 135–145)
Total Bilirubin: 0.4 mg/dL (ref 0.2–1.2)
Total Protein: 7 g/dL (ref 6.0–8.3)

## 2023-07-21 LAB — LIPID PANEL
Cholesterol: 163 mg/dL (ref 0–200)
HDL: 47.7 mg/dL (ref >39.00)
LDL Cholesterol: 95 mg/dL (ref 0–99)
NonHDL: 115.19
Total CHOL/HDL Ratio: 3
Triglycerides: 99 mg/dL (ref 0.0–149.0)
VLDL: 19.8 mg/dL (ref 0.0–40.0)

## 2023-07-21 MED ORDER — DIPHENHYDRAMINE HCL 25 MG PO CAPS
25.0000 mg | ORAL_CAPSULE | Freq: Once | ORAL | Status: AC
  Administered 2023-07-21: 25 mg via ORAL
  Filled 2023-07-21: qty 1

## 2023-07-21 MED ORDER — TOCILIZUMAB 400 MG/20ML IV SOLN
4.0000 mg/kg | Freq: Once | INTRAVENOUS | Status: AC
  Administered 2023-07-21: 510 mg via INTRAVENOUS
  Filled 2023-07-21: qty 25.5

## 2023-07-21 MED ORDER — ACETAMINOPHEN 325 MG PO TABS
650.0000 mg | ORAL_TABLET | Freq: Once | ORAL | Status: AC
  Administered 2023-07-21: 650 mg via ORAL
  Filled 2023-07-21: qty 2

## 2023-07-21 NOTE — Progress Notes (Signed)
Diagnosis: Rheumatoid Arthritis  Provider:  Chilton Greathouse MD  Procedure: IV Infusion  IV Type: Peripheral, IV Location: L Forearm  Actemra (Tocilizumab), Dose: 510 mg  Infusion Start Time: 1609  Infusion Stop Time: 1710  Post Infusion IV Care: Patient declined observation and Peripheral IV Discontinued  Discharge: Condition: Good, Destination: Home . AVS Declined  Performed by:  Marlow Baars [place]king[place]-Burchett, RN

## 2023-07-25 ENCOUNTER — Telehealth (HOSPITAL_COMMUNITY): Payer: BC Managed Care – PPO | Admitting: Student

## 2023-07-25 ENCOUNTER — Encounter (HOSPITAL_COMMUNITY): Payer: Self-pay | Admitting: Student

## 2023-07-25 DIAGNOSIS — M0579 Rheumatoid arthritis with rheumatoid factor of multiple sites without organ or systems involvement: Secondary | ICD-10-CM | POA: Diagnosis not present

## 2023-07-25 DIAGNOSIS — F6089 Other specific personality disorders: Secondary | ICD-10-CM

## 2023-07-25 DIAGNOSIS — F3341 Major depressive disorder, recurrent, in partial remission: Secondary | ICD-10-CM

## 2023-07-25 DIAGNOSIS — F411 Generalized anxiety disorder: Secondary | ICD-10-CM | POA: Diagnosis not present

## 2023-07-25 DIAGNOSIS — Z79899 Other long term (current) drug therapy: Secondary | ICD-10-CM

## 2023-07-25 MED ORDER — PAROXETINE HCL 30 MG PO TABS: 60.0000 mg | ORAL_TABLET | Freq: Every day | ORAL | 2 refills | Status: DC

## 2023-07-25 MED ORDER — TRAZODONE HCL 100 MG PO TABS: 100.0000 mg | ORAL_TABLET | Freq: Every evening | ORAL | 0 refills | Status: DC | PRN

## 2023-07-25 MED ORDER — PROPRANOLOL HCL 20 MG PO TABS: 20.0000 mg | ORAL_TABLET | Freq: Two times a day (BID) | ORAL | 2 refills | Status: DC

## 2023-07-25 MED ORDER — HYDROXYZINE PAMOATE 25 MG PO CAPS: 25.0000 mg | ORAL_CAPSULE | Freq: Every evening | ORAL | 2 refills | Status: AC | PRN

## 2023-07-25 MED ORDER — FOLIC ACID 1 MG PO TABS: 2.0000 mg | ORAL_TABLET | Freq: Every day | ORAL | 2 refills | Status: AC

## 2023-07-25 NOTE — Patient Instructions (Addendum)
As you reported some initial difficulties with readjusting to daily life, resulting in self-harm behaviors, you may find some of the lessons in this workbook helpful!  [email or web address].[email or web address]
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
## 2023-07-25 NOTE — Addendum Note (Signed)
Addended by: Everlena Cooper on: 07/25/2023 03:27 PM   Modules accepted: Level of Service

## 2023-08-02 ENCOUNTER — Ambulatory Visit: Payer: BC Managed Care – PPO | Admitting: Physician Assistant

## 2023-08-02 DIAGNOSIS — M069 Rheumatoid arthritis, unspecified: Secondary | ICD-10-CM

## 2023-08-02 DIAGNOSIS — M170 Bilateral primary osteoarthritis of knee: Secondary | ICD-10-CM

## 2023-08-02 DIAGNOSIS — Z8639 Personal history of other endocrine, nutritional and metabolic disease: Secondary | ICD-10-CM

## 2023-08-02 DIAGNOSIS — M65341 Trigger finger, right ring finger: Secondary | ICD-10-CM

## 2023-08-02 DIAGNOSIS — F3341 Major depressive disorder, recurrent, in partial remission: Secondary | ICD-10-CM

## 2023-08-02 DIAGNOSIS — Z8659 Personal history of other mental and behavioral disorders: Secondary | ICD-10-CM

## 2023-08-02 DIAGNOSIS — M0579 Rheumatoid arthritis with rheumatoid factor of multiple sites without organ or systems involvement: Secondary | ICD-10-CM

## 2023-08-02 DIAGNOSIS — Z8742 Personal history of other diseases of the female genital tract: Secondary | ICD-10-CM

## 2023-08-02 DIAGNOSIS — Z79899 Other long term (current) drug therapy: Secondary | ICD-10-CM

## 2023-08-02 DIAGNOSIS — Z8701 Personal history of pneumonia (recurrent): Secondary | ICD-10-CM

## 2023-08-16 NOTE — Progress Notes (Signed)
 Office Visit Note  Patient: Shannon David             Date of Birth: 1990/11/30           MRN: [id number]             PCP: Lars Mage, NP Referring: Lars Mage, NP Visit Date: 08/23/2023 Occupation: @GUAROCC @  Subjective:  Frequent flares   History of Present Illness: Shannon David is a 33 y.o. female with history of seropositive rheumatoid arthritis.  Patient remains on IV Actemra-first infusion on 11/30/2022, Plaquenil 200 mg 1 tab BID Monday-Friday (restarted 10/2021), Methotrexate 8 tablets once wkly, and folic acid 2 mg daily.  Patient is scheduled to receive her Actemra infusion today.  Patient states that she had a possible infusion last week since she had a gastrointestinal infection.  Patient states that she was also had influenza and COVID 19 recently.  Patient continues to have sporadic flares involving multiple joints.  Her flares have been most severe involving both hands.  She takes Tylenol and ibuprofen as needed for pain relief. Patient states that she has had a job role change at work and is no longer working night shift.  She is back to having to work a more physically demanding position which contributes to the severity and frequency of her flares.  She will be renewing FMLA paperwork and will also be filing for disability.    Activities of Daily Living:  Patient reports morning stiffness for 1-2 hours.   Patient Reports nocturnal pain.  Difficulty dressing/grooming: Reports Difficulty climbing stairs: Reports Difficulty getting out of chair: Reports Difficulty using hands for taps, buttons, cutlery, and/or writing: Reports  Review of Systems  Constitutional:  Positive for fatigue.  HENT:  Negative for mouth sores, mouth dryness and nose dryness.   Eyes:  Negative for pain and dryness.  Respiratory:  Negative for shortness of breath, wheezing and difficulty breathing.   Cardiovascular:  Positive for palpitations. Negative for chest pain.   Gastrointestinal:  Negative for blood in stool, constipation and diarrhea.  Endocrine: Negative for increased urination.  Genitourinary:  Negative for involuntary urination.  Musculoskeletal:  Positive for joint pain, gait problem, joint pain, joint swelling and morning stiffness. Negative for myalgias, muscle weakness, muscle tenderness and myalgias.  Skin:  Positive for sensitivity to sunlight. Negative for color change, rash and hair loss.  Allergic/Immunologic: Positive for susceptible to infections.  Neurological:  Positive for headaches. Negative for dizziness.  Hematological:  Negative for swollen glands.  Psychiatric/Behavioral:  Positive for depressed mood and sleep disturbance. The patient is nervous/anxious.     PMFS History:  Patient Active Problem List   Diagnosis Date Noted   GAD (generalized anxiety disorder) 04/22/2023   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   MDD (major depressive disorder), recurrent, in partial remission (HCC) 11/11/2022   Pain in left knee 06/04/2021   History of PCOS 02/21/2017   High risk medication use 09/21/2016   Rheumatoid arthritis involving multiple sites with positive rheumatoid factor (HCC) 09/21/2016   History of hypothyroidism 09/21/2016   History of depression 09/21/2016    Past Medical History:  Diagnosis Date   Anxiety    COVID    History of Clostridium difficile infection 01/2023   History of hypothyroidism    History of PCOS    Major depressive disorder, recurrent severe without psychotic features (HCC)    MDD (major depressive disorder), recurrent episode, moderate (HCC) 04/21/2023   Pt presented for self harm  by cutting wrist at work     Passive suicidal ideations 11/11/2022   Rheumatoid arthritis (HCC)    Severe recurrent major depression without psychotic features (HCC) 05/06/2023   Suicidal behavior with attempted self-injury (HCC) 04/22/2023    Family History  Problem Relation Age of Onset   Depression  Mother    Bipolar disorder Mother    Hypertension Mother    Clotting disorder Mother    Depression Brother    Rheum arthritis Brother    Lung cancer Paternal Uncle    Cancer Maternal Grandmother        lung   Past Surgical History:  Procedure Laterality Date   BUNIONECTOMY Right    FOOT SURGERY Right 08/24/2021   Social History   Social History Narrative   Left Handed    Lives in a one story home   Drinks Caffeine    Immunization History  Administered Date(s) Administered   PFIZER(Purple Top)SARS-COV-2 Vaccination 10/23/2021     Objective: Vital Signs: BP 127/86 (BP Location: Left Arm, Patient Position: Sitting, Cuff Size: Large)   Pulse 79   Resp 14   Ht 5\' 2"  (1.575 m)   Wt 279 lb (126.6 kg)   BMI 51.03 kg/m    Physical Exam Vitals and nursing note reviewed.  Constitutional:      Appearance: She is well-developed.  HENT:     Head: Normocephalic and atraumatic.  Eyes:     Conjunctiva/sclera: Conjunctivae normal.  Cardiovascular:     Rate and Rhythm: Normal rate and regular rhythm.     Heart sounds: Normal heart sounds.  Pulmonary:     Effort: Pulmonary effort is normal.     Breath sounds: Normal breath sounds.  Abdominal:     General: Bowel sounds are normal.     Palpations: Abdomen is soft.  Musculoskeletal:     Cervical back: Normal range of motion.  Lymphadenopathy:     Cervical: No cervical adenopathy.  Skin:    General: Skin is warm and dry.     Capillary Refill: Capillary refill takes less than 2 seconds.  Neurological:     Mental Status: She is alert and oriented to person, place, and time.  Psychiatric:        Behavior: Behavior normal.      Musculoskeletal Exam: C-spine, thoracic spine, and lumbar spine good ROM.  Shoulder joints have good range of motion.  Elbow joints have good range of motion with no tenderness along the joint line.  Severely limited range of motion of both wrist joints with synovial thickening.  Synovial thickening and  ulnar deviation of all MCP joints.  Hip joints have good range of motion with no groin pain.  Knee joints have good range of motion with some discomfort in her right knee.  Ankle joints have slightly limited range of motion with some thickening but no active synovitis noted.  CDAI Exam: CDAI Score: -- Patient Global: --; Provider Global: -- Swollen: --; Tender: -- Joint Exam 08/23/2023   No joint exam has been documented for this visit   There is currently no information documented on the homunculus. Go to the Rheumatology activity and complete the homunculus joint exam.  Investigation: No additional findings.  Imaging: No results found.  Recent Labs: Lab Results  Component Value Date   WBC 6.2 07/21/2023   HGB 13.3 07/21/2023   PLT 234.0 07/21/2023   NA 134 (L) 07/21/2023   K 4.0 07/21/2023   CL 103 07/21/2023   CO2  25 07/21/2023   GLUCOSE 102 (H) 07/21/2023   BUN 14 07/21/2023   CREATININE 0.64 07/21/2023   BILITOT 0.4 07/21/2023   ALKPHOS 56 07/21/2023   AST 16 07/21/2023   ALT 13 07/21/2023   PROT 7.0 07/21/2023   ALBUMIN 4.2 07/21/2023   CALCIUM 9.0 07/21/2023   GFRAA 134 07/18/2020   QFTBGOLD Negative 08/13/2016   QFTBGOLDPLUS NEGATIVE 10/18/2022    Speciality Comments: PLQ Eye Exam: 10/19/2022 WNL @ [facility] Follow up in 12 months.   Prior therapy: Orencia (inadequate response 9/18-9/19), Enbrel (inadequate response 3/15/5/15), Humira (inadequate response 11/17-3/18), and injectable methotrexate (non-compliance)  Procedures:  No procedures performed Allergies: Keflex [cephalexin]   Assessment / Plan:     Visit Diagnoses: Rheumatoid arthritis involving multiple sites with positive rheumatoid factor (HCC): Severe, erosive rheumatoid arthritis involving multiple joints: Patient was last seen in the office on 03/22/2023.  Patient is currently prescribed IV Actemra 4 mg/kg IV infusions once every 4 weeks, Plaquenil 200 mg 1 tablet by mouth twice daily Monday  through Friday, methotrexate 8 tablets by mouth once weekly.  Patient had to postpone her Actemra infusion last week due to having gastrointestinal infection.  She is planning to receive the Actemra infusion today.  She continues to experience unpredictable but severe frequent flares particularly involving both hands.  She has had a job role change and is no longer working night shifts and has had to return to a more physically demanding role.  He attributes the frequency of flares to being under increased stress as well as with the job role change.  She has difficulty performing any repetitive or overuse activities as well as difficulty standing or walking for prolonged distances due to the discomfort in her feet.  She continues to have signs of active disease despite the current treatment regimen.  Overall she has had about a 70% improvement in her symptoms since initiating IV Actemra but at times she still is difficulty performing ADLs due to the severity of her disease.  Patient will be updating FMLA paperwork as well as will be filing for disability in the future. She will remain on the current treatment regimen for now.  She was advised to notify us if she develops any new or worsening symptoms.  She will follow-up in the office in 3 months or sooner if needed.  High risk medication use:  IV Actemra 4mg /kg infusions every 4 weeks-first infusion on 11/30/2022, Plaquenil 200 mg 1 tablet BID Monday-Friday (restarted 10/2021), Methotrexate 8 tablets once wkly, and folic acid 2 mg daily.  CBC and CMP updated on 07/21/23.  She will continue to have updated lab work with her infusions.  Scheduled to receive Actemra today. Lipid panel updated on 07/21/23.  TB gold negative on 10/18/22 PLQ Eye Exam: 10/19/2022 WNL @ [facility] Follow up in 12 months.  Previous therapy includes: Orencia, Enbrel, Humira, xeljanz, rinvoq, and injectable methotrexate Discussed the importance of holding actemra and methotrexate if she  develops signs or symptoms of an infection and to resume once the infection has completely cleared.  This winter the patient has had the flu, COVID 19, and a gastrointestinal infection.  Do not plan to increase the dose of Actemra at this time.  Encouraged the patient to take vitamin C, zinc, and vitamin D.  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): Patient continues to experience frequent flares involving both hands.  She has synovial thickening and severely limited range of motion of both  wrist joints as well as synovial thickening and ulnar deviation of all MCP joints.  She has difficulty performing any repetitive or overuse activities with her hands due to the severity of arthritis.  She continues to have sporadic flares despite remaining on the current treatment regimen. Patient will be applying for disability.  Trigger finger, right ring finger: Not currently symptomatic.  Primary osteoarthritis of both knees: She experiences intermittent discomfort in both knee joints particularly her right knee.  She recently hyperextended her right knee at work which has exacerbated her discomfort.  On examination today she has some discomfort with range of motion of the right knee but no effusion was noted.  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 Marylene Land. She underwent right foot reconstructive surgery on 08/24/2021 which was successful.  She experiences intermittent discomfort in her feet particularly if having to stand for prolonged periods of time especially while at work.  Other medical conditions are listed as follows:   History of depression  History of hypothyroidism  History of pneumonia  History of PCOS  Orders: No orders of the defined types were placed in this encounter.  No orders of the defined types were placed in this encounter.   Follow-Up Instructions: Return in about 3 months (around 11/20/2023) for Rheumatoid  arthritis.   Gearldine Bienenstock, PA-C  Note - This record has been created using Dragon software.  Chart creation errors have been sought, but may not always  have been located. Such creation errors do not reflect on  the standard of medical care.

## 2023-08-17 ENCOUNTER — Encounter (HOSPITAL_COMMUNITY): Payer: Self-pay | Admitting: Clinical

## 2023-08-17 ENCOUNTER — Ambulatory Visit (INDEPENDENT_AMBULATORY_CARE_PROVIDER_SITE_OTHER): Payer: BC Managed Care – PPO | Admitting: Clinical

## 2023-08-17 DIAGNOSIS — F411 Generalized anxiety disorder: Secondary | ICD-10-CM

## 2023-08-17 DIAGNOSIS — F6089 Other specific personality disorders: Secondary | ICD-10-CM
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
## 2023-08-18 MED ORDER — DIPHENHYDRAMINE HCL 25 MG PO CAPS
25.0000 mg | ORAL_CAPSULE | Freq: Once | ORAL | Status: DC
Start: 2023-08-18 — End: 2023-09-19

## 2023-08-18 MED ORDER — ACETAMINOPHEN 325 MG PO TABS
650.0000 mg | ORAL_TABLET | Freq: Once | ORAL | Status: DC
Start: 2023-08-18 — End: 2023-09-19

## 2023-08-20 ENCOUNTER — Other Ambulatory Visit (HOSPITAL_COMMUNITY): Payer: Self-pay | Admitting: Student

## 2023-08-23 ENCOUNTER — Encounter: Payer: Self-pay | Admitting: Physician Assistant

## 2023-08-23 ENCOUNTER — Ambulatory Visit: Payer: BC Managed Care – PPO | Attending: Physician Assistant | Admitting: Physician Assistant

## 2023-08-23 ENCOUNTER — Ambulatory Visit (INDEPENDENT_AMBULATORY_CARE_PROVIDER_SITE_OTHER): Payer: BC Managed Care – PPO

## 2023-08-23 VITALS — BP 127/86 | HR 79 | Resp 14 | Ht 62.0 in | Wt 279.0 lb

## 2023-08-23 VITALS — BP 126/84 | HR 69 | Temp 98.2°F | Resp 16 | Ht 62.0 in | Wt 279.6 lb

## 2023-08-23 DIAGNOSIS — Z79899 Other long term (current) drug therapy: Secondary | ICD-10-CM

## 2023-08-23 DIAGNOSIS — Z8701 Personal history of pneumonia (recurrent): Secondary | ICD-10-CM

## 2023-08-23 DIAGNOSIS — M21942 Unspecified acquired deformity of hand, left hand: Secondary | ICD-10-CM

## 2023-08-23 DIAGNOSIS — Z8659 Personal history of other mental and behavioral disorders: Secondary | ICD-10-CM

## 2023-08-23 DIAGNOSIS — M170 Bilateral primary osteoarthritis of knee: Secondary | ICD-10-CM

## 2023-08-23 DIAGNOSIS — M21962 Unspecified acquired deformity of left lower leg: Secondary | ICD-10-CM

## 2023-08-23 DIAGNOSIS — M0579 Rheumatoid arthritis with rheumatoid factor of multiple sites without organ or systems involvement: Secondary | ICD-10-CM

## 2023-08-23 DIAGNOSIS — M21941 Unspecified acquired deformity of hand, right hand: Secondary | ICD-10-CM | POA: Diagnosis not present

## 2023-08-23 DIAGNOSIS — M21961 Unspecified acquired deformity of right lower leg: Secondary | ICD-10-CM

## 2023-08-23 DIAGNOSIS — M65341 Trigger finger, right ring finger: Secondary | ICD-10-CM | POA: Diagnosis not present

## 2023-08-23 DIAGNOSIS — M069 Rheumatoid arthritis, unspecified: Secondary | ICD-10-CM

## 2023-08-23 DIAGNOSIS — Z8639 Personal history of other endocrine, nutritional and metabolic disease: Secondary | ICD-10-CM

## 2023-08-23 DIAGNOSIS — Z8742 Personal history of other diseases of the female genital tract: Secondary | ICD-10-CM

## 2023-08-23 MED ORDER — TOCILIZUMAB 400 MG/20ML IV SOLN
4.0000 mg/kg | Freq: Once | INTRAVENOUS | Status: AC
  Administered 2023-08-23: 506 mg via INTRAVENOUS
  Filled 2023-08-23: qty 25.3

## 2023-08-23 MED ORDER — ACETAMINOPHEN 325 MG PO TABS
650.0000 mg | ORAL_TABLET | Freq: Once | ORAL | Status: AC
  Administered 2023-08-23: 650 mg via ORAL
  Filled 2023-08-23: qty 2

## 2023-08-23 MED ORDER — DIPHENHYDRAMINE HCL 25 MG PO CAPS
25.0000 mg | ORAL_CAPSULE | Freq: Once | ORAL | Status: AC
  Administered 2023-08-23: 25 mg via ORAL
  Filled 2023-08-23: qty 1

## 2023-08-23 NOTE — Progress Notes (Signed)
 Diagnosis: Rheumatoid Arthritis  Provider:  Chilton Greathouse MD  Procedure: IV Infusion  IV Type: Peripheral, IV Location: L Antecubital  Actemra (Tocilizumab), Dose: 506 mg  Infusion Start Time: 1546 pm  Infusion Stop Time: 1647 pm  Post Infusion IV Care: Observation period completed and Peripheral IV Discontinued  Discharge: Condition: Good, Destination: Home . AVS Declined  Performed by:  Wyvonne Lenz, RN

## 2023-08-31 ENCOUNTER — Telehealth: Payer: Self-pay | Admitting: *Deleted

## 2023-08-31 NOTE — Telephone Encounter (Signed)
 Attempted to contact the patient and left message to advise patient her FMLA paperwork is complete and at front desk ready for pick up.

## 2023-09-03 ENCOUNTER — Other Ambulatory Visit: Payer: Self-pay | Admitting: Rheumatology

## 2023-09-03 DIAGNOSIS — M0579 Rheumatoid arthritis with rheumatoid factor of multiple sites without organ or systems involvement: Secondary | ICD-10-CM

## 2023-09-03 DIAGNOSIS — Z79899 Other long term (current) drug therapy: Secondary | ICD-10-CM

## 2023-09-05 NOTE — Telephone Encounter (Signed)
 Last Fill: 06/15/2023  Labs: 07/21/2023 CMET Sodium 134, Glucose 102,   Next Visit: 11/22/2023  Last Visit: 08/23/2023  DX: Rheumatoid arthritis involving multiple sites with positive rheumatoid factor   Current Dose per office note 08/23/2023: Methotrexate 8 tablets once wkly   Okay to refill Methotrexate?

## 2023-09-12 ENCOUNTER — Ambulatory Visit (INDEPENDENT_AMBULATORY_CARE_PROVIDER_SITE_OTHER): Payer: BC Managed Care – PPO | Admitting: Clinical

## 2023-09-12 ENCOUNTER — Encounter (HOSPITAL_COMMUNITY): Payer: Self-pay | Admitting: Clinical

## 2023-09-12 DIAGNOSIS — F331 Major depressive disorder, recurrent, moderate: Secondary | ICD-10-CM

## 2023-09-12 DIAGNOSIS — F6089 Other specific personality disorders: Secondary | ICD-10-CM | POA: Diagnosis not present

## 2023-09-12 DIAGNOSIS — F411 Generalized anxiety disorder: Secondary | ICD-10-CM

## 2023-09-12 NOTE — Progress Notes (Unsigned)
 THERAPIST PROGRESS NOTE  Session Time: 4:10pm-4:55pm  Session #9  Virtual Visit via Video Note  I connected with Shannon David on 09/12/23 at  4:00 PM EDT by a video enabled telemedicine application and verified that I am speaking with the correct person using two identifiers.  Location: Patient: home Provider: home office   I discussed the limitations of evaluation and management by telemedicine and the availability of in person appointments. The patient expressed understanding and agreed to proceed.   I discussed the assessment and treatment plan with the patient. The patient was provided an opportunity to ask questions and all were answered. The patient agreed with the plan and demonstrated an understanding of the instructions.   The patient was advised to call back or seek an in-person evaluation if the symptoms worsen or if the condition fails to improve as anticipated.  I provided 45 minutes of non-face-to-face time during this encounter.  Lynnell Chad, LCSW   Participation Level: Active  Behavioral Response: Casual Alert Euthymic   Type of Therapy: Individual Therapy  Treatment Goals addressed:  LTG: Shannon David will score less than 5 on the Generalized Anxiety Disorder 7 Scale (GAD-7) STG: Shannon David will practice problem solving skills 3 times per week for the next 4 weeks. STG: Shannon David will reduce frequency of avoidant behaviors by 50% as evidenced by self-report in therapy sessions LTG: Reduce frequency, intensity, and duration of depression symptoms so that daily functioning is improved LTG: Shannon David will score less than 5 on the Patient Health Questionnaire (PHQ-9) STG: Shannon David will participate in at least 80% of scheduled therapy sessions STG: Shannon David will complete at least 80% of assigned homework STG: Shannon David will identify cognitive patterns and beliefs that support depression LTG: Learn and practice communication techniques such as active listening, "I"  statements, open-ended questions, reflective listening, assertiveness, fair fighting rules, initiating conversations, and more as necessary and taught in session.   STG: Learn emotion regulation strategies, distress tolerance skills, interpersonal effectiveness techniques, and mindfulness practices and use them in session and in life situations to improve results and satisfaction.   LTG: Process life events to the extent needed so that will be able to move forward with various areas of life in a better frame of mind per self-report.   STG: Work on forgiveness, shame, sleep, relationship to food, or other issues as appropriate and as these present during sessions.  STG: Work to Arts development officer from models like CBT, Stages of Change, DBT, shame resilience theory, ACT, SFBT, MI, trauma-informed therapy and others to be able to manage mental health symptoms, AEB practicing out of session and reporting back.  LTG: Learn about boundary types, how to implement them, and how to enforce them so that feels more empowered and content with being able to maintain more helpful, appropriate boundaries in the future for a more balanced result.  STG: Learn breathing techniques and grounding techniques at an age-appropriate and ability-appropriate level and demonstrate mastery in session then report independent use of these skills out of session.   ProgressTowards Goals: Progressing  Interventions: Assertiveness Training and Supportive   Summary: Shannon David is a 33 y.o. female who presents with Major Depressive Disorder that has worsened recently. She presented oriented x5 and stated she was feeling "really good."  CSW evaluated patient's medication compliance, use of coping tools, and self-care, as applicable.  She provided an update on various aspects of her life that are normally discussed in therapy, including job status, ongoing issues with estranged husband, news  of boyfriend, application for disability,  and problems arising with alcoholic mother.  ***   Suicidal/Homicidal: No without intent/plan  Therapist Response: Patient is progressing AEB engaging in scheduled therapy session.  Throughout the session, CSW gave patient the opportunity to explore thoughts and feelings associated with current life situations and past/present stressors.   CSW challenged patient gently and appropriately to consider different ways of looking at reported issues. CSW encouraged patient's expression of feelings and validated these using empathy, active listening, open body language, and unconditional positive regard.   CSW encouraged patient to schedule more therapy sessions for the future, as needed.  She is concerned that she is losing her insurance because of quitting her job, was assured that if she gets Medicaid we can continue to meet.  Until this is in place, she was told about the groups.     Plan: Return to next scheduled appointment on 5/7  Recommendations:   Return to next appointment on 5/7, then every 4 weeks as possible depending on outcome of her insurance, consider handout on boundaries sent to her via email, consider detachment as read to her  Diagnosis:  MDD (major depressive disorder), recurrent episode, moderate (HCC)  GAD (generalized anxiety disorder)  Cluster B personality disorder (HCC)  Collaboration of Care: Psychiatrist AEB - psychiatric provider can see therapy notes as needed, therapist can read psychiatric notes  Patient/Guardian was advised Release of Information must be obtained prior to any record release in order to collaborate their care with an outside provider. Patient/Guardian was advised if they have not already done so to contact the registration department to sign all necessary forms in order for [place] to release information regarding their care.   Consent: Patient/Guardian gives verbal consent for treatment and assignment of benefits for services provided during this visit.  Patient/Guardian expressed understanding and agreed to proceed.   Lynnell Chad, LCSW 09/12/2023

## 2023-09-18 ENCOUNTER — Other Ambulatory Visit (HOSPITAL_COMMUNITY): Payer: Self-pay | Admitting: Family

## 2023-09-19 ENCOUNTER — Encounter: Payer: Self-pay | Admitting: Physician Assistant

## 2023-09-20 ENCOUNTER — Ambulatory Visit: Payer: BC Managed Care – PPO

## 2023-09-20 ENCOUNTER — Telehealth: Payer: Self-pay | Admitting: Pharmacy Technician

## 2023-09-20 MED ORDER — DIPHENHYDRAMINE HCL 25 MG PO CAPS
25.0000 mg | ORAL_CAPSULE | Freq: Once | ORAL | Status: DC
Start: 2023-09-20 — End: 2023-09-21

## 2023-09-20 MED ORDER — ACETAMINOPHEN 325 MG PO TABS
650.0000 mg | ORAL_TABLET | Freq: Once | ORAL | Status: DC
Start: 2023-09-20 — End: 2023-09-21

## 2023-09-20 NOTE — Telephone Encounter (Addendum)
 Patient will become un-insured on 09/27/23. [employer] is aware and will reach out to patient.  Auth Submission PAP APPROVED D: [id number]  Site of care: Site of care: [facility] Payer: FREE DRUG Medication & CPT/J Code(s) submitted:  Route of submission (phone, fax, portal):  Phone # Fax # Auth type:  Units/visits requested:  Reference number:  Approval from: 10/04/23 - 06/27/24

## 2023-09-21 ENCOUNTER — Encounter: Payer: Self-pay | Admitting: Physician Assistant

## 2023-09-21 ENCOUNTER — Telehealth: Payer: Self-pay | Admitting: Pharmacist

## 2023-09-21 NOTE — Telephone Encounter (Signed)
 Patient appears to be uninsured. Received [employer] PAP application from [employer]). Form signed by Ladona Ridgel and faxed to Jacqualine Code, PharmD, MPH, BCPS, CPP Clinical Pharmacist (Rheumatology and Pulmonology)

## 2023-09-26 ENCOUNTER — Ambulatory Visit (INDEPENDENT_AMBULATORY_CARE_PROVIDER_SITE_OTHER)

## 2023-09-26 VITALS — BP 119/81 | HR 71 | Temp 97.7°F | Resp 20 | Ht 62.0 in | Wt 282.6 lb

## 2023-09-26 DIAGNOSIS — M0579 Rheumatoid arthritis with rheumatoid factor of multiple sites without organ or systems involvement: Secondary | ICD-10-CM | POA: Diagnosis not present

## 2023-09-26 MED ORDER — DIPHENHYDRAMINE HCL 25 MG PO CAPS: 25.0000 mg | ORAL_CAPSULE | Freq: Once | ORAL | Status: DC

## 2023-09-26 MED ORDER — ACETAMINOPHEN 325 MG PO TABS
650.0000 mg | ORAL_TABLET | Freq: Once | ORAL | Status: DC
Start: 2023-09-26 — End: 2023-09-26

## 2023-09-26 MED ORDER — SODIUM CHLORIDE 0.9 % IV SOLN
4.0000 mg/kg | Freq: Once | INTRAVENOUS | Status: AC
  Administered 2023-09-26: 512 mg via INTRAVENOUS
  Filled 2023-09-26: qty 20

## 2023-09-26 NOTE — Progress Notes (Signed)
 Diagnosis: Rheumatoid Arthritis  Provider:  Chilton Greathouse MD  Procedure: IV Infusion  IV Type: Peripheral, IV Location: L Antecubital  Actemra (Tocilizumab), Dose: 512mg   Infusion Start Time: 1438  Infusion Stop Time: 1538  Post Infusion IV Care: Peripheral IV Discontinued  Discharge: Condition: Good, Destination: Home . AVS Declined  Performed by:  Nat Math, RN

## 2023-10-20 ENCOUNTER — Encounter: Payer: Self-pay | Admitting: Physician Assistant

## 2023-10-24 ENCOUNTER — Other Ambulatory Visit: Payer: Self-pay

## 2023-10-24 ENCOUNTER — Ambulatory Visit (INDEPENDENT_AMBULATORY_CARE_PROVIDER_SITE_OTHER)

## 2023-10-24 VITALS — BP 116/79 | HR 76 | Temp 98.0°F | Resp 18 | Ht 62.0 in | Wt 282.2 lb

## 2023-10-24 DIAGNOSIS — M0579 Rheumatoid arthritis with rheumatoid factor of multiple sites without organ or systems involvement: Secondary | ICD-10-CM

## 2023-10-24 DIAGNOSIS — Z79899 Other long term (current) drug therapy: Secondary | ICD-10-CM

## 2023-10-24 MED ORDER — SODIUM CHLORIDE 0.9 % IV SOLN
4.0000 mg/kg | Freq: Once | INTRAVENOUS | Status: AC
  Administered 2023-10-24: 512 mg via INTRAVENOUS
  Filled 2023-10-24: qty 15.6

## 2023-10-24 MED ORDER — ACETAMINOPHEN 325 MG PO TABS
650.0000 mg | ORAL_TABLET | Freq: Once | ORAL | Status: AC
  Administered 2023-10-24: 650 mg via ORAL
  Filled 2023-10-24: qty 2

## 2023-10-24 MED ORDER — DIPHENHYDRAMINE HCL 25 MG PO CAPS
25.0000 mg | ORAL_CAPSULE | Freq: Once | ORAL | Status: AC
  Administered 2023-10-24: 25 mg via ORAL
  Filled 2023-10-24: qty 1

## 2023-10-24 NOTE — Progress Notes (Signed)
 Diagnosis: Rheumatoid Arthritis  Provider:  Phyllis Breeze MD  Procedure: IV Infusion  IV Type: Peripheral, IV Location: L Antecubital  Actemra  (Tocilizumab ), Dose: 512 mg  Infusion Start Time: 1521  Infusion Stop Time: 1622  Post Infusion IV Care: Patient declined observation and Peripheral IV Discontinued  Discharge: Condition: Stable, Destination: Home . AVS Declined  Performed by:  Lauran Pollard, LPN

## 2023-10-25 NOTE — Progress Notes (Signed)
 Absolute eosinophils low. Rest of CBC WNL

## 2023-10-25 NOTE — Progress Notes (Signed)
 Glucose is 113. Rest of CMP WNL

## 2023-10-26 LAB — CBC WITH DIFFERENTIAL/PLATELET
Absolute Lymphocytes: 1109 {cells}/uL (ref 850–3900)
Absolute Monocytes: 620 {cells}/uL (ref 200–950)
Basophils Absolute: 20 {cells}/uL (ref 0–200)
Basophils Relative: 0.3 %
Eosinophils Absolute: 0 {cells}/uL — ABNORMAL LOW (ref 15–500)
Eosinophils Relative: 0 %
HCT: 39.6 % (ref 35.0–45.0)
Hemoglobin: 13.7 g/dL (ref 11.7–15.5)
MCH: 32.1 pg (ref 27.0–33.0)
MCHC: 34.6 g/dL (ref 32.0–36.0)
MCV: 92.7 fL (ref 80.0–100.0)
MPV: 11.6 fL (ref 7.5–12.5)
Monocytes Relative: 9.4 %
Neutro Abs: 4851 {cells}/uL (ref 1500–7800)
Neutrophils Relative %: 73.5 %
Platelets: 214 10*3/uL (ref 140–400)
RBC: 4.27 10*6/uL (ref 3.80–5.10)
RDW: 12.3 % (ref 11.0–15.0)
Total Lymphocyte: 16.8 %
WBC: 6.6 10*3/uL (ref 3.8–10.8)

## 2023-10-26 LAB — COMPLETE METABOLIC PANEL WITHOUT GFR
AG Ratio: 1.6 (calc) (ref 1.0–2.5)
ALT: 14 U/L (ref 6–29)
AST: 15 U/L (ref 10–30)
Albumin: 4.2 g/dL (ref 3.6–5.1)
Alkaline phosphatase (APISO): 68 U/L (ref 31–125)
BUN: 11 mg/dL (ref 7–25)
CO2: 24 mmol/L (ref 20–32)
Calcium: 9 mg/dL (ref 8.6–10.2)
Chloride: 103 mmol/L (ref 98–110)
Creat: 0.73 mg/dL (ref 0.50–0.97)
Globulin: 2.6 g/dL (ref 1.9–3.7)
Glucose, Bld: 113 mg/dL — ABNORMAL HIGH (ref 65–99)
Potassium: 3.6 mmol/L (ref 3.5–5.3)
Sodium: 136 mmol/L (ref 135–146)
Total Bilirubin: 0.5 mg/dL (ref 0.2–1.2)
Total Protein: 6.8 g/dL (ref 6.1–8.1)

## 2023-10-26 LAB — QUANTIFERON-TB GOLD PLUS
Mitogen-NIL: 6.82 [IU]/mL
NIL: 0.02 [IU]/mL
QuantiFERON-TB Gold Plus: NEGATIVE
TB1-NIL: 0 [IU]/mL
TB2-NIL: 0 [IU]/mL

## 2023-10-26 NOTE — Progress Notes (Signed)
 TB Gold : Negative.

## 2023-10-28 ENCOUNTER — Encounter: Payer: Self-pay | Admitting: Physician Assistant

## 2023-10-31 ENCOUNTER — Ambulatory Visit: Payer: Self-pay | Admitting: Podiatry

## 2023-11-02 ENCOUNTER — Ambulatory Visit (INDEPENDENT_AMBULATORY_CARE_PROVIDER_SITE_OTHER): Payer: BC Managed Care – PPO | Admitting: Clinical

## 2023-11-02 ENCOUNTER — Encounter (HOSPITAL_COMMUNITY): Payer: Self-pay

## 2023-11-02 DIAGNOSIS — Z91199 Patient's noncompliance with other medical treatment and regimen due to unspecified reason: Secondary | ICD-10-CM

## 2023-11-02 NOTE — Progress Notes (Signed)
 Shannon David    CSW attempted to connect with patient for scheduled appointment via MyChart video text request x 2 and email request with no response; also attempted to connect via phone without success. CSW left message for patient to call office to reschedule therapy appointment.        Attempt 1: Text and email: 1:02pm      Attempt 2: Text: 1:08pm       Attempt 3: Phone call and HIPAA-compliant voicemail: 1:20pm      Left video chat open until:  1:24pm      Per [place] policy, after multiple attempts to reach patient unsuccessfully at appointed time, visit will be coded as a no show.    Encounter Diagnosis  Name Primary?   No-show for appointment Yes       [name], LCSW 11/02/2023, 1:25 PM

## 2023-11-08 ENCOUNTER — Encounter: Payer: Self-pay | Admitting: Physician Assistant

## 2023-11-08 NOTE — Progress Notes (Deleted)
 Office Visit Note  Patient: Shannon David             Date of Birth: 1990/09/10           MRN: [id number]             PCP: Teofilo Fellers, NP Referring: Teofilo Fellers, NP Visit Date: 11/22/2023 Occupation: @GUAROCC @  Subjective:    History of Present Illness: Shannon David is a 33 y.o. female with history of seropositive rheumatoid arthritis and osteoarthritis.  Patient remains on  IV Actemra  4mg /kg q4 weeks-first infusion on 11/30/2022, Plaquenil  200 mg 1 tablet BID Monday-Friday (restarted 10/2021), Methotrexate  8 tablets once wkly, and folic acid  2 mg daily.     CBC and CMP updated on 10/24/23. Her next lab work will be due in July and every 3 months.  Lipid panel updated on 07/21/23.  TB gold negative on 10/24/23.  PLQ Eye Exam: 10/19/2022 WNL @ [facility] Follow up in 12 months.  Previous therapy includes: Orencia , Enbrel, Humira, xeljanz , rinvoq , and injectable methotrexate    Activities of Daily Living:  Patient reports morning stiffness for *** {minute/hour:19697}.   Patient {ACTIONS;DENIES/REPORTS:21021675::"Denies"} nocturnal pain.  Difficulty dressing/grooming: {ACTIONS;DENIES/REPORTS:21021675::"Denies"} Difficulty climbing stairs: {ACTIONS;DENIES/REPORTS:21021675::"Denies"} Difficulty getting out of chair: {ACTIONS;DENIES/REPORTS:21021675::"Denies"} Difficulty using hands for taps, buttons, cutlery, and/or writing: {ACTIONS;DENIES/REPORTS:21021675::"Denies"}  No Rheumatology ROS completed.   PMFS History:  Patient Active Problem List   Diagnosis Date Noted   GAD (generalized anxiety disorder) 04/22/2023   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   MDD (major depressive disorder), recurrent, in partial remission (HCC) 11/11/2022   Pain in left knee 06/04/2021   History of PCOS 02/21/2017   High risk medication use 09/21/2016   Rheumatoid arthritis involving multiple sites with positive rheumatoid factor (HCC) 09/21/2016   History of hypothyroidism  09/21/2016   History of depression 09/21/2016    Past Medical History:  Diagnosis Date   Anxiety    COVID    History of Clostridium difficile infection 01/2023   History of hypothyroidism    History of PCOS    Major depressive disorder, recurrent severe without psychotic features (HCC)    MDD (major depressive disorder), recurrent episode, moderate (HCC) 04/21/2023   Pt presented for self harm by cutting wrist at work     Passive suicidal ideations 11/11/2022   Rheumatoid arthritis (HCC)    Severe recurrent major depression without psychotic features (HCC) 05/06/2023   Suicidal behavior with attempted self-injury (HCC) 04/22/2023    Family History  Problem Relation Age of Onset   Depression Mother    Bipolar disorder Mother    Hypertension Mother    Clotting disorder Mother    Depression Brother    Rheum arthritis Brother    Lung cancer Paternal Uncle    Cancer Maternal Grandmother        lung   Past Surgical History:  Procedure Laterality Date   BUNIONECTOMY Right    FOOT SURGERY Right 08/24/2021   Social History   Social History Narrative   Left Handed    Lives in a one story home   Drinks Caffeine    Immunization History  Administered Date(s) Administered   PFIZER(Purple Top)SARS-COV-2 Vaccination 10/23/2021     Objective: Vital Signs: There were no vitals taken for this visit.   Physical Exam Vitals and nursing note reviewed.  Constitutional:      Appearance: She is well-developed.  HENT:     Head: Normocephalic and atraumatic.  Eyes:  Conjunctiva/sclera: Conjunctivae normal.  Cardiovascular:     Rate and Rhythm: Normal rate and regular rhythm.     Heart sounds: Normal heart sounds.  Pulmonary:     Effort: Pulmonary effort is normal.     Breath sounds: Normal breath sounds.  Abdominal:     General: Bowel sounds are normal.     Palpations: Abdomen is soft.  Musculoskeletal:     Cervical back: Normal range of motion.  Lymphadenopathy:      Cervical: No cervical adenopathy.  Skin:    General: Skin is warm and dry.     Capillary Refill: Capillary refill takes less than 2 seconds.  Neurological:     Mental Status: She is alert and oriented to person, place, and time.  Psychiatric:        Behavior: Behavior normal.      Musculoskeletal Exam: ***  CDAI Exam: CDAI Score: -- Patient Global: --; Provider Global: -- Swollen: --; Tender: -- Joint Exam 11/22/2023   No joint exam has been documented for this visit   There is currently no information documented on the homunculus. Go to the Rheumatology activity and complete the homunculus joint exam.  Investigation: No additional findings.  Imaging: No results found.  Recent Labs: Lab Results  Component Value Date   WBC 6.6 10/24/2023   HGB 13.7 10/24/2023   PLT 214 10/24/2023   NA 136 10/24/2023   K 3.6 10/24/2023   CL 103 10/24/2023   CO2 24 10/24/2023   GLUCOSE 113 (H) 10/24/2023   BUN 11 10/24/2023   CREATININE 0.73 10/24/2023   BILITOT 0.5 10/24/2023   ALKPHOS 56 07/21/2023   AST 15 10/24/2023   ALT 14 10/24/2023   PROT 6.8 10/24/2023   ALBUMIN 4.2 07/21/2023   CALCIUM 9.0 10/24/2023   GFRAA 134 07/18/2020   QFTBGOLD Negative 08/13/2016   QFTBGOLDPLUS NEGATIVE 10/24/2023    Speciality Comments: PLQ Eye Exam: 10/19/2022 WNL @ [facility] Follow up in 12 months.   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  No orders of the defined types were placed in this encounter.   Face-to-face time spent with patient was *** minutes. Greater than 50% of time was spent in counseling and coordination of care.  Follow-Up Instructions: No follow-ups on file.   Romayne Clubs, PA-C  Note - This record has been created using Dragon software.  Chart creation errors have been sought, but may not always  have been located. Such creation errors do not reflect on  the standard of medical care.

## 2023-11-22 ENCOUNTER — Ambulatory Visit (INDEPENDENT_AMBULATORY_CARE_PROVIDER_SITE_OTHER)

## 2023-11-22 ENCOUNTER — Ambulatory Visit: Payer: BC Managed Care – PPO | Admitting: Physician Assistant

## 2023-11-22 VITALS — BP 118/87 | HR 72 | Temp 97.9°F | Resp 18 | Ht 62.0 in | Wt 287.8 lb

## 2023-11-22 DIAGNOSIS — M65341 Trigger finger, right ring finger: Secondary | ICD-10-CM

## 2023-11-22 DIAGNOSIS — Z8659 Personal history of other mental and behavioral disorders: Secondary | ICD-10-CM

## 2023-11-22 DIAGNOSIS — Z8701 Personal history of pneumonia (recurrent): Secondary | ICD-10-CM

## 2023-11-22 DIAGNOSIS — M069 Rheumatoid arthritis, unspecified: Secondary | ICD-10-CM

## 2023-11-22 DIAGNOSIS — M21961 Unspecified acquired deformity of right lower leg: Secondary | ICD-10-CM

## 2023-11-22 DIAGNOSIS — M0579 Rheumatoid arthritis with rheumatoid factor of multiple sites without organ or systems involvement: Secondary | ICD-10-CM

## 2023-11-22 DIAGNOSIS — M170 Bilateral primary osteoarthritis of knee: Secondary | ICD-10-CM

## 2023-11-22 DIAGNOSIS — Z79899 Other long term (current) drug therapy: Secondary | ICD-10-CM

## 2023-11-22 DIAGNOSIS — Z8742 Personal history of other diseases of the female genital tract: Secondary | ICD-10-CM

## 2023-11-22 DIAGNOSIS — Z8639 Personal history of other endocrine, nutritional and metabolic disease: Secondary | ICD-10-CM

## 2023-11-22 MED ORDER — DIPHENHYDRAMINE HCL 25 MG PO CAPS
25.0000 mg | ORAL_CAPSULE | Freq: Once | ORAL | Status: AC
  Administered 2023-11-22: 25 mg via ORAL
  Filled 2023-11-22: qty 1

## 2023-11-22 MED ORDER — ACETAMINOPHEN 325 MG PO TABS
650.0000 mg | ORAL_TABLET | Freq: Once | ORAL | Status: AC
  Administered 2023-11-22: 650 mg via ORAL
  Filled 2023-11-22: qty 2

## 2023-11-22 MED ORDER — TOCILIZUMAB 400 MG/20ML IV SOLN
512.0000 mg | Freq: Once | INTRAVENOUS | Status: AC
  Administered 2023-11-22: 512 mg via INTRAVENOUS
  Filled 2023-11-22: qty 15.6

## 2023-11-22 NOTE — Progress Notes (Signed)
 Diagnosis: Rheumatoid Arthritis  Provider:  Praveen Mannam MD  Procedure: IV Infusion  IV Type: Peripheral, IV Location: L Antecubital  Actemra  (Tocilizumab ), Dose: 512mg   Infusion Start Time: 1508  Infusion Stop Time: 1612  Post Infusion IV Care: Peripheral IV Discontinued  Discharge: Condition: Good, Destination: Home . AVS Declined  Performed by:  Rachelle Bue, RN

## 2023-11-27 ENCOUNTER — Other Ambulatory Visit: Payer: Self-pay | Admitting: Physician Assistant

## 2023-11-27 DIAGNOSIS — Z79899 Other long term (current) drug therapy: Secondary | ICD-10-CM

## 2023-11-27 DIAGNOSIS — M0579 Rheumatoid arthritis with rheumatoid factor of multiple sites without organ or systems involvement: Secondary | ICD-10-CM

## 2023-11-28 NOTE — Telephone Encounter (Signed)
 Last Fill: 09/05/2023  Labs: 10/24/2023 Glucose is 113. Rest of CMP WNL TB Gold : Negative.   Next Visit: Due 11/20/2023. Message sent to the front to schedule.   Last Visit: 08/23/2023  DX: Rheumatoid arthritis involving multiple sites with positive rheumatoid factor (HCC)   Current Dose per office note 08/23/2023: Methotrexate  8 tablets once wkly   Okay to refill Methotrexate ?

## 2023-11-28 NOTE — Telephone Encounter (Signed)
 Patient states she is unemployed and is currently applying for disability and Medicaid. Per [facility] - Patient will need to schedule an appointment in mid to late July 2025 so she can continue with her infusions.  Patient understood and will call back to schedule the appointment.

## 2023-11-28 NOTE — Telephone Encounter (Signed)
 Please schedule patient a follow up visit. Patient due 11/20/2023. Thanks!

## 2023-12-20 ENCOUNTER — Ambulatory Visit: Payer: Self-pay

## 2023-12-26 ENCOUNTER — Ambulatory Visit: Payer: Self-pay

## 2023-12-26 VITALS — BP 130/78 | HR 58 | Temp 98.3°F | Resp 16 | Ht 62.0 in | Wt 296.8 lb

## 2023-12-26 DIAGNOSIS — M0579 Rheumatoid arthritis with rheumatoid factor of multiple sites without organ or systems involvement: Secondary | ICD-10-CM

## 2023-12-26 MED ORDER — ACETAMINOPHEN 325 MG PO TABS: 650.0000 mg | ORAL_TABLET | Freq: Once | ORAL | Status: DC

## 2023-12-26 MED ORDER — TOCILIZUMAB 400 MG/20ML IV SOLN
4.0000 mg/kg | Freq: Once | INTRAVENOUS | Status: AC
  Administered 2023-12-26: 538 mg via INTRAVENOUS
  Filled 2023-12-26: qty 16.9

## 2023-12-26 MED ORDER — DIPHENHYDRAMINE HCL 25 MG PO CAPS
25.0000 mg | ORAL_CAPSULE | Freq: Once | ORAL | Status: DC
Start: 2023-12-26 — End: 2023-12-26

## 2023-12-26 NOTE — Progress Notes (Signed)
 Diagnosis: Rheumatoid arthritis  Provider:  Lonna Coder MD  Procedure: IV Infusion  IV Type: Peripheral, IV Location: L Antecubital  Actemra  (Tocilizumab ), Dose: 538 mg  Infusion Start Time: 1547  Infusion Stop Time: 1657  Post Infusion IV Care: Peripheral IV Discontinued  Discharge: Condition: Good, Destination: Home . AVS Declined  Performed by:  Maximiano JONELLE Pouch, LPN

## 2024-01-08 ENCOUNTER — Emergency Department (HOSPITAL_COMMUNITY): Payer: MEDICAID

## 2024-01-08 ENCOUNTER — Emergency Department (HOSPITAL_COMMUNITY)
Admission: EM | Admit: 2024-01-08 | Discharge: 2024-01-08 | Disposition: A | Discharge status: Home / Self Care | Payer: MEDICAID

## 2024-01-08 ENCOUNTER — Encounter (HOSPITAL_COMMUNITY): Payer: Self-pay

## 2024-01-08 ENCOUNTER — Other Ambulatory Visit: Payer: Self-pay

## 2024-01-08 DIAGNOSIS — R569 Unspecified convulsions: Secondary | ICD-10-CM | POA: Diagnosis not present

## 2024-01-08 DIAGNOSIS — R55 Syncope and collapse: Secondary | ICD-10-CM | POA: Diagnosis present

## 2024-01-08 LAB — CBC WITH DIFFERENTIAL/PLATELET
Abs Immature Granulocytes: 0.01 K/uL (ref 0.00–0.07)
Basophils Absolute: 0 K/uL (ref 0.0–0.1)
Basophils Relative: 1 %
Eosinophils Absolute: 0 K/uL (ref 0.0–0.5)
Eosinophils Relative: 0 %
HCT: 40 % (ref 36.0–46.0)
Hemoglobin: 14.1 g/dL (ref 12.0–15.0)
Immature Granulocytes: 0 %
Lymphocytes Relative: 23 %
Lymphs Abs: 0.9 K/uL (ref 0.7–4.0)
MCH: 32.4 pg (ref 26.0–34.0)
MCHC: 35.3 g/dL (ref 30.0–36.0)
MCV: 92 fL (ref 80.0–100.0)
Monocytes Absolute: 0.5 K/uL (ref 0.1–1.0)
Monocytes Relative: 13 %
Neutro Abs: 2.5 K/uL (ref 1.7–7.7)
Neutrophils Relative %: 63 %
Platelets: 224 K/uL (ref 150–400)
RBC: 4.35 MIL/uL (ref 3.87–5.11)
RDW: 12.5 % (ref 11.5–15.5)
WBC: 4.1 K/uL (ref 4.0–10.5)
nRBC: 0 % (ref 0.0–0.2)

## 2024-01-08 LAB — COMPREHENSIVE METABOLIC PANEL WITH GFR
ALT: 39 U/L (ref 0–44)
AST: 29 U/L (ref 15–41)
Albumin: 4.1 g/dL (ref 3.5–5.0)
Alkaline Phosphatase: 66 U/L (ref 38–126)
Anion gap: 9 (ref 5–15)
BUN: 12 mg/dL (ref 6–20)
CO2: 21 mmol/L — ABNORMAL LOW (ref 22–32)
Calcium: 9.4 mg/dL (ref 8.9–10.3)
Chloride: 104 mmol/L (ref 98–111)
Creatinine, Ser: 0.6 mg/dL (ref 0.44–1.00)
GFR, Estimated: >60 mL/min (ref >60)
Glucose, Bld: 97 mg/dL (ref 70–99)
Potassium: 3.6 mmol/L (ref 3.5–5.1)
Sodium: 134 mmol/L — ABNORMAL LOW (ref 135–145)
Total Bilirubin: 0.9 mg/dL (ref 0.0–1.2)
Total Protein: 7.2 g/dL (ref 6.5–8.1)

## 2024-01-08 LAB — HCG, SERUM, QUALITATIVE: Preg, Serum: NEGATIVE

## 2024-01-08 LAB — MAGNESIUM: Magnesium: 1.9 mg/dL (ref 1.7–2.4)

## 2024-01-08 LAB — TSH: TSH: 10.578 u[IU]/mL — ABNORMAL HIGH (ref 0.350–4.500)

## 2024-01-08 NOTE — ED Provider Triage Note (Signed)
 Emergency Medicine Provider Triage Evaluation Note  Shannon David , a 33 y.o. female  was evaluated in triage.  Pt complains of period of unresponsiveness this morning with body tensing up.  This was reported by family.  Questionable postictal phase.  No prior history of seizures.   Review of Systems  Positive: As above Negative: As above  Physical Exam  BP (!) 154/103 (BP Location: Right Arm)   Pulse 81   Temp 98.3 F (36.8 C)   Resp 18   SpO2 97%  Gen:   Awake, no distress   Resp:  Normal effort  MSK:   Moves extremities without difficulty  Other:   Medical Decision Making  Medically screening exam initiated at 2:22 PM.  Appropriate orders placed.  Carisa Backhaus was informed that the remainder of the evaluation will be completed by another provider, this initial triage assessment does not replace that evaluation, and the importance of remaining in the ED until their evaluation is complete.     Hildegard Loge, PA-C 01/08/24 1423

## 2024-01-08 NOTE — Discharge Instructions (Signed)
 Your workup today was reassuring.  I have placed a referral to neurology so you should receive a call in the next few days regarding follow-up.  Please return to the emergency department for worsening symptoms.

## 2024-01-08 NOTE — ED Notes (Signed)
 Extra Red top drawn

## 2024-01-08 NOTE — ED Triage Notes (Signed)
 Pt had an episode of being unresponsive with possible seizure like activity noted by family. Pt just c.o feeling tired due to not getting much sleep last night.

## 2024-01-08 NOTE — ED Provider Notes (Signed)
 [place] EMERGENCY DEPARTMENT AT [facility] Provider Note   CSN: [id number] Arrival date & time: 01/08/24  1401     Patient presents with: Loss of Consciousness   Shannon David is a 33 y.o. female.   33 year old female with past medical history of depression, rheumatoid arthritis, and anxiety presenting to the emergency department today after an episode where she became rigid and was not responding normally for a few minutes last night.  This occurred around 3 AM.  Patient reports she has had difficulty sleeping over the past few days.  Denies any fevers, neck pain or stiffness, or other acute symptoms.  She denied any loss of bowel or bladder function during this episode.  No obvious tonic-clonic activity was noted.  She is feeling back to her baseline today is denying any complaints.  She states that she was lightheaded before this episode.  Denies any associated chest pain.   Loss of Consciousness      Prior to Admission medications   Medication Sig Start Date End Date Taking? Authorizing Provider  albuterol  (VENTOLIN  HFA) 108 (90 Base) MCG/ACT inhaler Inhale 1-2 puffs into the lungs every 6 (six) hours as needed for wheezing or shortness of breath. 04/25/23   Tex Drilling, NP  Azelastine HCl 137 MCG/SPRAY SOLN Place 2 sprays into both nostrils 2 (two) times daily. 06/17/23   [provider]  Cholecalciferol  (VITAMIN D3) 50 MCG (2000 UT) capsule Take 2,000 Units by mouth daily. Patient not taking: Reported on 08/23/2023 10/13/21   [provider]  gabapentin  (NEURONTIN ) 300 MG capsule Take 1 capsule (300 mg total) by mouth 2 (two) times daily. 04/25/23   Tex Drilling, NP  hydroxychloroquine  (PLAQUENIL ) 200 MG tablet TAKE 1 TABLET BY MOUTH TWICE A DAY MONDAY-FRIDAY 03/22/23   Cheryl Waddell HERO, PA-C  levothyroxine  (SYNTHROID ) 112 MCG tablet Take 1 tablet (112 mcg total) by mouth daily. 04/25/23   Tex Drilling, NP  lisinopril (ZESTRIL) 10 MG tablet  Take 10 mg by mouth daily.    [provider]  methotrexate  (RHEUMATREX) 2.5 MG tablet TAKE 8 TABLETS (20 MG TOTAL) BY MOUTH ONCE A WEEK. CAUTION:CHEMOTHERAPY. PROTECT FROM LIGHT. 11/28/23   Dolphus Reiter, MD  PARoxetine  (PAXIL ) 30 MG tablet Take 2 tablets (60 mg total) by mouth daily. 07/25/23 10/23/23  Rainelle Pfeiffer, MD  propranolol  (INDERAL ) 20 MG tablet Take 1 tablet (20 mg total) by mouth 2 (two) times daily. 07/25/23 10/23/23  Rainelle Pfeiffer, MD  Tocilizumab  (ACTEMRA ) 80 MG/4ML SOLN injection INFUSE 4MG /KG INTRAVENOUSLY EVERY 4 WEEKS (Body weight 123.9kg) 05/31/23   Cheryl Waddell HERO, PA-C  traZODone  (DESYREL ) 100 MG tablet Take 1 tablet (100 mg total) by mouth at bedtime as needed for sleep. 07/25/23   Rainelle Pfeiffer, MD    Allergies: Keflex [cephalexin]    Review of Systems  Cardiovascular:  Positive for syncope.  Neurological:  Positive for light-headedness.  All other systems reviewed and are negative.   Updated Vital Signs BP (!) 151/91 (BP Location: Left Arm)   Pulse 83   Temp 98.5 F (36.9 C) (Oral)   Resp 15   Ht 5' 2 (1.575 m)   Wt 127 kg   LMP 12/16/2023 (Approximate)   SpO2 97%   BMI 51.21 kg/m   Physical Exam Gen: NAD Eyes: PERRL, EOMI HEENT: no oropharyngeal swelling Neck: trachea midline, no meningismus Resp: clear to auscultation bilaterally Card: RRR, no murmurs, rubs, or gallops Abd: nontender, nondistended Extremities: no calf tenderness, no edema Vascular: 2+ radial  pulses bilaterally, 2+ DP pulses bilaterally Neuro: Cranial nerves intact, equal strength and sensation throughout bilateral upper and lower extremities with no dysmetria on finger-nose testing Skin: no rashes Psyc: acting appropriately  (all labs ordered are listed, but only abnormal results are displayed) Labs Reviewed  COMPREHENSIVE METABOLIC PANEL WITH GFR - Abnormal; Notable for the following components:      Result Value   Sodium 134 (*)    CO2 21 (*)    All other  components within normal limits  TSH - Abnormal; Notable for the following components:   TSH 10.578 (*)    All other components within normal limits  CBC WITH DIFFERENTIAL/PLATELET  MAGNESIUM   HCG, SERUM, QUALITATIVE    EKG: EKG Interpretation Date/Time:  Sunday January 08 2024 14:22:22 EDT Ventricular Rate:  79 PR Interval:  172 QRS Duration:  86 QT Interval:  348 QTC Calculation: 399 R Axis:   55  Text Interpretation: Normal sinus rhythm Normal ECG When compared with ECG of 09-Apr-2023 19:27, PREVIOUS ECG IS PRESENT Confirmed by Ula Barter [phone]) on 01/08/2024 3:58:42 PM  Radiology: CT Head Wo Contrast Result Date: 01/08/2024 CLINICAL DATA:  New onset seizure.  No history of trauma EXAM: CT HEAD WITHOUT CONTRAST TECHNIQUE: Contiguous axial images were obtained from the base of the skull through the vertex without intravenous contrast. RADIATION DOSE REDUCTION: This exam was performed according to the departmental dose-optimization program which includes automated exposure control, adjustment of the mA and/or kV according to patient size and/or use of iterative reconstruction technique. COMPARISON:  None Available. FINDINGS: Brain: No intracranial hemorrhage, mass effect, or evidence of acute infarct. No hydrocephalus. No extra-axial fluid collection. Vascular: No hyperdense vessel or unexpected calcification. Skull: No fracture or focal lesion. Sinuses/Orbits: No acute finding. Other: None. IMPRESSION: No acute intracranial abnormality. Electronically Signed   By: Norman Gatlin M.D.   On: 01/08/2024 18:28     Procedures   Medications Ordered in the ED - No data to display                                  Medical Decision Making 33 year old female with past medical history of rheumatoid arthritis, depression, and anxiety presenting to the emergency department today after a syncopal/near syncopal episode last night with questionable seizure-like activity.  Will further evaluate the  patient here with basic labs as well as a CT scan of her head to evaluate for intracranial hemorrhage or mass lesion as she has no history of seizures.  I will obtain an EKG and keep the patient on the cardiac monitor for further evaluation for arrhythmias.  Will obtain hCG to evaluate for intrauterine versus ectopic pregnancy.  I will reevaluate for ultimate disposition.  The patient's workup here is reassuring.  Her CT scan is unremarkable and labs are reassuring.  She has not had any seizure activity here.  She does not have any findings on exam consistent with meningitis or encephalitis or other acute emergent condition at this time.  She will be discharged with neurology follow-up with return precautions.  Amount and/or Complexity of Data Reviewed Labs: ordered.        Final diagnoses:  Observed seizure-like activity [facility])    ED Discharge Orders          Ordered    Ambulatory referral to Neurology       Comments: An appointment is requested in approximately: 2 weeks   01/08/24  1909               Ula Prentice SAUNDERS, MD 01/08/24 1910

## 2024-01-19 ENCOUNTER — Other Ambulatory Visit: Payer: Self-pay | Admitting: Medical Genetics

## 2024-01-20 ENCOUNTER — Encounter (HOSPITAL_COMMUNITY): Payer: Self-pay | Admitting: Student in an Organized Health Care Education/Training Program

## 2024-01-23 ENCOUNTER — Ambulatory Visit (INDEPENDENT_AMBULATORY_CARE_PROVIDER_SITE_OTHER): Payer: Self-pay

## 2024-01-23 ENCOUNTER — Encounter: Payer: Self-pay | Admitting: Physician Assistant

## 2024-01-23 ENCOUNTER — Other Ambulatory Visit: Payer: Self-pay

## 2024-01-23 VITALS — BP 114/75 | HR 89 | Temp 97.7°F | Resp 16 | Ht 62.0 in | Wt 298.0 lb

## 2024-01-23 DIAGNOSIS — M0579 Rheumatoid arthritis with rheumatoid factor of multiple sites without organ or systems involvement: Secondary | ICD-10-CM

## 2024-01-23 DIAGNOSIS — Z79899 Other long term (current) drug therapy: Secondary | ICD-10-CM

## 2024-01-23 MED ORDER — DIPHENHYDRAMINE HCL 25 MG PO CAPS
25.0000 mg | ORAL_CAPSULE | Freq: Once | ORAL | Status: DC
Start: 2024-01-23 — End: 2024-01-23

## 2024-01-23 MED ORDER — ACETAMINOPHEN 325 MG PO TABS: 650.0000 mg | ORAL_TABLET | Freq: Once | ORAL | Status: DC

## 2024-01-23 MED ORDER — TOCILIZUMAB 400 MG/20ML IV SOLN
4.0000 mg/kg | Freq: Once | INTRAVENOUS | Status: AC
  Administered 2024-01-23: 540 mg via INTRAVENOUS
  Filled 2024-01-23: qty 17

## 2024-01-23 NOTE — Progress Notes (Signed)
 Diagnosis: Rheumatoid Arthritis  Provider:  Lonna Coder MD  Procedure: IV Infusion  IV Type: Peripheral, IV Location: L Antecubital  Actemra  (Tocilizumab ), Dose: 540mg   Infusion Start Time: 1536  Infusion Stop Time: 1640  Post Infusion IV Care: Peripheral IV Discontinued  Discharge: Condition: Good, Destination: Home . AVS Declined  Performed by:  Nuel Dejaynes, RN

## 2024-01-24 ENCOUNTER — Ambulatory Visit: Payer: Self-pay | Admitting: Physician Assistant

## 2024-01-24 LAB — LIPID PANEL
Cholesterol: 178 mg/dL (ref <200)
HDL: 48 mg/dL — ABNORMAL LOW (ref >=50)
LDL Cholesterol (Calc): 111 mg/dL — ABNORMAL HIGH
Non-HDL Cholesterol (Calc): 130 mg/dL — ABNORMAL HIGH (ref <130)
Total CHOL/HDL Ratio: 3.7 (calc) (ref <5.0)
Triglycerides: 91 mg/dL (ref <150)

## 2024-01-24 NOTE — Progress Notes (Signed)
 LDL is borderline elevated-111.  HDL is borderline low. Please notify the patient and forward results to PCP.

## 2024-01-31 ENCOUNTER — Encounter: Payer: Self-pay | Admitting: Physician Assistant

## 2024-02-01 ENCOUNTER — Other Ambulatory Visit (HOSPITAL_COMMUNITY): Payer: Self-pay
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
## 2024-02-02 ENCOUNTER — Telehealth (HOSPITAL_COMMUNITY): Payer: Self-pay | Admitting: Student in an Organized Health Care Education/Training Program

## 2024-02-02 ENCOUNTER — Encounter (HOSPITAL_COMMUNITY): Payer: Self-pay | Admitting: Student in an Organized Health Care Education/Training Program

## 2024-02-02 NOTE — Telephone Encounter (Signed)
 Patient did not show up for the appointment.  Appointment reminders were sent to patient's phone.  Attempted to call the patient at [phone] but received no response. Left a HIPAA compliant voicemail for patient to reschedule.    Hannah Strader Carrin Carrero, MD PGY-3, [facility]

## 2024-02-06 ENCOUNTER — Encounter: Payer: Self-pay | Admitting: Physician Assistant

## 2024-02-08 NOTE — Progress Notes (Deleted)
 Office Visit Note  Patient: Shannon David             Date of Birth: 1991-04-09           MRN: [id number]             PCP: Nestor Elston NOVAK, NP Referring: Nestor Elston NOVAK, NP Visit Date: 02/13/2024 Occupation: @GUAROCC @  Subjective:    History of Present Illness: Corah Willeford is a 33 y.o. female with history of seropositive rheumatoid arthritis.  Patient remains on  IV Actemra  4mg /kg every 4 weeks-first infusion 11/30/2022, Plaquenil  200 mg 1 tablet BID M-F(restarted 10/2021), MTX 8 tablets once wkly,folic acid  2 mg daily.   CBC and CMP updated on 01/08/24.  TB gold negative on 10/24/23. Discussed the importance of holding actemra  and methotrexate  if she develops signs or symptoms of an infection and to resume once the infection has completely clerared.  PLQ Eye Exam: 10/19/2022 WNL @ [facility] Follow up in 12 months.   Activities of Daily Living:  Patient reports morning stiffness for *** {minute/hour:19697}.   Patient {ACTIONS;DENIES/REPORTS:21021675::Denies} nocturnal pain.  Difficulty dressing/grooming: {ACTIONS;DENIES/REPORTS:21021675::Denies} Difficulty climbing stairs: {ACTIONS;DENIES/REPORTS:21021675::Denies} Difficulty getting out of chair: {ACTIONS;DENIES/REPORTS:21021675::Denies} Difficulty using hands for taps, buttons, cutlery, and/or writing: {ACTIONS;DENIES/REPORTS:21021675::Denies}  No Rheumatology ROS completed.   PMFS History:  Patient Active Problem List   Diagnosis Date Noted   GAD (generalized anxiety disorder) 04/22/2023   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   MDD (major depressive disorder), recurrent, in partial remission (HCC) 11/11/2022   Pain in left knee 06/04/2021   History of PCOS 02/21/2017   High risk medication use 09/21/2016   Rheumatoid arthritis involving multiple sites with positive rheumatoid factor (HCC) 09/21/2016   History of hypothyroidism 09/21/2016   History of depression 09/21/2016    Past Medical  History:  Diagnosis Date   Anxiety    COVID    History of Clostridium difficile infection 01/2023   History of hypothyroidism    History of PCOS    Major depressive disorder, recurrent severe without psychotic features (HCC)    MDD (major depressive disorder), recurrent episode, moderate (HCC) 04/21/2023   Pt presented for self harm by cutting wrist at work     Passive suicidal ideations 11/11/2022   Rheumatoid arthritis (HCC)    Severe recurrent major depression without psychotic features (HCC) 05/06/2023   Suicidal behavior with attempted self-injury (HCC) 04/22/2023    Family History  Problem Relation Age of Onset   Depression Mother    Bipolar disorder Mother    Hypertension Mother    Clotting disorder Mother    Depression Brother    Rheum arthritis Brother    Lung cancer Paternal Uncle    Cancer Maternal Grandmother        lung   Past Surgical History:  Procedure Laterality Date   BUNIONECTOMY Right    FOOT SURGERY Right 08/24/2021   Social History   Social History Narrative   Left Handed    Lives in a one story home   Drinks Caffeine    Immunization History  Administered Date(s) Administered   PFIZER(Purple Top)SARS-COV-2 Vaccination 10/23/2021     Objective: Vital Signs: There were no vitals taken for this visit.   Physical Exam Vitals and nursing note reviewed.  Constitutional:      Appearance: She is well-developed.  HENT:     Head: Normocephalic and atraumatic.  Eyes:     Conjunctiva/sclera: Conjunctivae normal.  Cardiovascular:     Rate and Rhythm:  Normal rate and regular rhythm.     Heart sounds: Normal heart sounds.  Pulmonary:     Effort: Pulmonary effort is normal.     Breath sounds: Normal breath sounds.  Abdominal:     General: Bowel sounds are normal.     Palpations: Abdomen is soft.  Musculoskeletal:     Cervical back: Normal range of motion.  Lymphadenopathy:     Cervical: No cervical adenopathy.  Skin:    General: Skin is warm  and dry.     Capillary Refill: Capillary refill takes less than 2 seconds.  Neurological:     Mental Status: She is alert and oriented to person, place, and time.  Psychiatric:        Behavior: Behavior normal.      Musculoskeletal Exam: ***  CDAI Exam: CDAI Score: -- Patient Global: --; Provider Global: -- Swollen: --; Tender: -- Joint Exam 02/13/2024   No joint exam has been documented for this visit   There is currently no information documented on the homunculus. Go to the Rheumatology activity and complete the homunculus joint exam.  Investigation: No additional findings.  Imaging: No results found.  Recent Labs: Lab Results  Component Value Date   WBC 4.1 01/08/2024   HGB 14.1 01/08/2024   PLT 224 01/08/2024   NA 134 (L) 01/08/2024   K 3.6 01/08/2024   CL 104 01/08/2024   CO2 21 (L) 01/08/2024   GLUCOSE 97 01/08/2024   BUN 12 01/08/2024   CREATININE 0.60 01/08/2024   BILITOT 0.9 01/08/2024   ALKPHOS 66 01/08/2024   AST 29 01/08/2024   ALT 39 01/08/2024   PROT 7.2 01/08/2024   ALBUMIN 4.1 01/08/2024   CALCIUM 9.4 01/08/2024   GFRAA 134 07/18/2020   QFTBGOLD Negative 08/13/2016   QFTBGOLDPLUS NEGATIVE 10/24/2023    Speciality Comments: PLQ Eye Exam: 10/19/2022 WNL @ [facility] Follow up in 12 months.   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  No orders of the defined types were placed in this encounter.   Face-to-face time spent with patient was *** minutes. Greater than 50% of time was spent in counseling and coordination of care.  Follow-Up Instructions: No follow-ups on file.   Waddell CHRISTELLA Craze, PA-C  Note - This record has been created using Dragon software.  Chart creation errors have been sought, but may not always  have been located. Such creation errors do not reflect on  the standard of medical care.

## 2024-02-13 ENCOUNTER — Ambulatory Visit: Payer: Self-pay | Admitting: Physician Assistant

## 2024-02-13 DIAGNOSIS — M21942 Unspecified acquired deformity of hand, left hand: Secondary | ICD-10-CM

## 2024-02-13 DIAGNOSIS — Z8742 Personal history of other diseases of the female genital tract: Secondary | ICD-10-CM

## 2024-02-13 DIAGNOSIS — M0579 Rheumatoid arthritis with rheumatoid factor of multiple sites without organ or systems involvement: Secondary | ICD-10-CM

## 2024-02-13 DIAGNOSIS — M21941 Unspecified acquired deformity of hand, right hand: Secondary | ICD-10-CM

## 2024-02-13 DIAGNOSIS — Z79899 Other long term (current) drug therapy: Secondary | ICD-10-CM

## 2024-02-13 DIAGNOSIS — M21961 Unspecified acquired deformity of right lower leg: Secondary | ICD-10-CM

## 2024-02-13 DIAGNOSIS — Z8639 Personal history of other endocrine, nutritional and metabolic disease: Secondary | ICD-10-CM

## 2024-02-13 DIAGNOSIS — Z8659 Personal history of other mental and behavioral disorders: Secondary | ICD-10-CM

## 2024-02-13 DIAGNOSIS — Z8701 Personal history of pneumonia (recurrent): Secondary | ICD-10-CM

## 2024-02-13 DIAGNOSIS — M170 Bilateral primary osteoarthritis of knee: Secondary | ICD-10-CM

## 2024-02-13 DIAGNOSIS — M65341 Trigger finger, right ring finger: Secondary | ICD-10-CM

## 2024-02-14 ENCOUNTER — Telehealth: Payer: Self-pay | Admitting: Pharmacy Technician

## 2024-02-14 ENCOUNTER — Telehealth (HOSPITAL_COMMUNITY): Payer: Self-pay

## 2024-02-14 ENCOUNTER — Telehealth: Payer: Self-pay

## 2024-02-14 ENCOUNTER — Encounter: Payer: Self-pay | Admitting: Physician Assistant

## 2024-02-14 NOTE — Telephone Encounter (Signed)
 Patient will need new treatment plan and labs entered for next appt in Sept.  Patient will be seen at [facility] due to [facility] pricing.  Luke

## 2024-02-14 NOTE — Telephone Encounter (Signed)
 Auth Submission: NO AUTH NEEDED Site of care: Site of care: [facility] Payer: [employer] [place] Medication & CPT/J Code(s) submitted: Actemra  (Tocilizumab ) G6737) Diagnosis Code: M05.79 Route of submission (phone, fax, portal):  Phone # Fax # Auth type: Buy/Bill HB Units/visits requested: 4mg /kg q 4 weeks Approval from: 02/14/24 to 06/27/24

## 2024-02-20 ENCOUNTER — Ambulatory Visit: Payer: Self-pay

## 2024-02-20 VITALS — BP 126/85 | HR 54 | Temp 98.3°F | Resp 12 | Ht 62.0 in | Wt 291.4 lb

## 2024-02-20 DIAGNOSIS — M0579 Rheumatoid arthritis with rheumatoid factor of multiple sites without organ or systems involvement: Secondary | ICD-10-CM

## 2024-02-20 MED ORDER — ACETAMINOPHEN 325 MG PO TABS: 650.0000 mg | ORAL_TABLET | Freq: Once | ORAL | Status: DC

## 2024-02-20 MED ORDER — TOCILIZUMAB 400 MG/20ML IV SOLN
4.0000 mg/kg | Freq: Once | INTRAVENOUS | Status: AC
  Administered 2024-02-20: 528 mg via INTRAVENOUS
  Filled 2024-02-20: qty 16.4

## 2024-02-20 MED ORDER — DIPHENHYDRAMINE HCL 25 MG PO CAPS: 25.0000 mg | ORAL_CAPSULE | Freq: Once | ORAL | Status: DC

## 2024-02-20 NOTE — Progress Notes (Unsigned)
 Office Visit Note  Patient: Shannon David             Date of Birth: 01-06-1991           MRN: [id number]             PCP: Nestor Elston NOVAK, NP Referring: Nestor Elston NOVAK, NP Visit Date: 02/21/2024 Occupation: @GUAROCC @  Subjective:  Intermittent flares  History of Present Illness: Shannon David is a 33 y.o. female with history of seropositive rheumatoid arthritis.  Patient remains on  IV Actemra  4mg /kg every 4 weeks-first infusion 11/30/2022, Plaquenil  200 mg 1 tablet BID M-F(restarted 10/2021), MTX 8 tablets once wkly,folic acid  2 mg daily.   Her most recent infusion was administered yesterday on 02/20/24.  She has been tolerating combination therapy without any side effects.  She continues to have migratory arthralgias and recurrent flares.  Patient states that on Sunday she started to have increased pain in the right knee.  She is having a buckling sensation in the right knee intermittently.  Patient states that starting yesterday she had severe pain in the left ankle to the point that she had difficulty ambulating but now her ankle pain has improved today.  She states that the pain in her hands has been unchanged.  She continues to notice increased changes and deformity in her hands with time.  Patient states that overall she has found this treatment regimen to be the most effective.  She denies any recent gaps in therapy.  She denies any recent or recurrent infections.  She is been taking ibuprofen  for symptomatic relief.    Activities of Daily Living:  Patient reports morning stiffness for 30-60 minutes.   Patient Reports nocturnal pain.  Difficulty dressing/grooming: Reports Difficulty climbing stairs: Reports Difficulty getting out of chair: Reports Difficulty using hands for taps, buttons, cutlery, and/or writing: Reports  Review of Systems  Constitutional:  Positive for fatigue.  HENT:  Negative for mouth sores and mouth dryness.   Eyes:  Negative for dryness.  Respiratory:   Negative for shortness of breath.   Cardiovascular:  Negative for chest pain and palpitations.  Gastrointestinal:  Negative for blood in stool, constipation and diarrhea.  Endocrine: Negative for increased urination.  Genitourinary:  Negative for involuntary urination.  Musculoskeletal:  Positive for joint pain, gait problem, joint pain, joint swelling, myalgias, muscle weakness, morning stiffness, muscle tenderness and myalgias.  Skin:  Positive for sensitivity to sunlight. Negative for color change, rash and hair loss.  Allergic/Immunologic: Positive for susceptible to infections.  Neurological:  Negative for dizziness and headaches.  Hematological:  Negative for swollen glands.  Psychiatric/Behavioral:  Positive for depressed mood and sleep disturbance. The patient is nervous/anxious.     PMFS History:  Patient Active Problem List   Diagnosis Date Noted   GAD (generalized anxiety disorder) 04/22/2023   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   MDD (major depressive disorder), recurrent, in partial remission (HCC) 11/11/2022   Pain in left knee 06/04/2021   History of PCOS 02/21/2017   High risk medication use 09/21/2016   Rheumatoid arthritis involving multiple sites with positive rheumatoid factor (HCC) 09/21/2016   History of hypothyroidism 09/21/2016   History of depression 09/21/2016    Past Medical History:  Diagnosis Date   Anxiety    COVID    History of Clostridium difficile infection 01/2023   History of hypothyroidism    History of PCOS    Major depressive disorder, recurrent severe without psychotic features (HCC)  MDD (major depressive disorder), recurrent episode, moderate (HCC) 04/21/2023   Pt presented for self harm by cutting wrist at work     Passive suicidal ideations 11/11/2022   Rheumatoid arthritis (HCC)    Severe recurrent major depression without psychotic features (HCC) 05/06/2023   Suicidal behavior with attempted self-injury (HCC) 04/22/2023     Family History  Problem Relation Age of Onset   Depression Mother    Bipolar disorder Mother    Hypertension Mother    Clotting disorder Mother    Depression Brother    Rheum arthritis Brother    Lung cancer Paternal Uncle    Cancer Maternal Grandmother        lung   Past Surgical History:  Procedure Laterality Date   BUNIONECTOMY Right    FOOT SURGERY Right 08/24/2021   Social History   Social History Narrative   Left Handed    Lives in a one story home   Drinks Caffeine    Immunization History  Administered Date(s) Administered   PFIZER(Purple Top)SARS-COV-2 Vaccination 10/23/2021     Objective: Vital Signs: BP 133/87 (BP Location: Left Arm, Patient Position: Sitting, Cuff Size: Normal)   Pulse (!) 57   Resp 14   Ht 5' 2 (1.575 m)   Wt 292 lb (132.5 kg)   LMP 02/15/2024   BMI 53.41 kg/m    Physical Exam Vitals and nursing note reviewed.  Constitutional:      Appearance: She is well-developed.  HENT:     Head: Normocephalic and atraumatic.  Eyes:     Conjunctiva/sclera: Conjunctivae normal.  Cardiovascular:     Rate and Rhythm: Normal rate and regular rhythm.     Heart sounds: Normal heart sounds.  Pulmonary:     Effort: Pulmonary effort is normal.     Breath sounds: Normal breath sounds.     Comments: +Crackles at lung bases  Abdominal:     General: Bowel sounds are normal.     Palpations: Abdomen is soft.  Musculoskeletal:     Cervical back: Normal range of motion.  Lymphadenopathy:     Cervical: No cervical adenopathy.  Skin:    General: Skin is warm and dry.     Capillary Refill: Capillary refill takes less than 2 seconds.  Neurological:     Mental Status: She is alert and oriented to person, place, and time.  Psychiatric:        Behavior: Behavior normal.      Musculoskeletal Exam: C-spine has limited ROM with lateral rotation.  Shoulder joints have good range of motion with no discomfort.  Elbow joints have good range of motion with  no tenderness along the joint line.  Severely limited range of motion of both wrist joints with synovial thickening.  Synovial thickening and ulnar deviation of MCP joints.  Hip joints have good range of motion with no groin pain.  Discomfort with range of motion of the right knee.  Warmth of the right knee noted.  No effusion of the knee joints noted.  Synovial thickening of both ankle joints with limited range of motion.  Tenderness and warmth of the left ankle.  CDAI Exam: CDAI Score: -- Patient Global: --; Provider Global: -- Swollen: --; Tender: -- Joint Exam 02/21/2024   No joint exam has been documented for this visit   There is currently no information documented on the homunculus. Go to the Rheumatology activity and complete the homunculus joint exam.  Investigation: No additional findings.  Imaging: No results  found.  Recent Labs: Lab Results  Component Value Date   WBC 4.1 01/08/2024   HGB 14.1 01/08/2024   PLT 224 01/08/2024   NA 134 (L) 01/08/2024   K 3.6 01/08/2024   CL 104 01/08/2024   CO2 21 (L) 01/08/2024   GLUCOSE 97 01/08/2024   BUN 12 01/08/2024   CREATININE 0.60 01/08/2024   BILITOT 0.9 01/08/2024   ALKPHOS 66 01/08/2024   AST 29 01/08/2024   ALT 39 01/08/2024   PROT 7.2 01/08/2024   ALBUMIN 4.1 01/08/2024   CALCIUM 9.4 01/08/2024   GFRAA 134 07/18/2020   QFTBGOLD Negative 08/13/2016   QFTBGOLDPLUS NEGATIVE 10/24/2023    Speciality Comments: PLQ Eye Exam: 10/19/2022 WNL @ [facility] Follow up in 12 months.   Prior therapy: Orencia  (inadequate response 9/18-9/19), Enbrel (inadequate response 3/15/5/15), Humira (inadequate response 11/17-3/18), and injectable methotrexate  (non-compliance)  Procedures:  No procedures performed Allergies: Cephalexin    Assessment / Plan:     Visit Diagnoses: Rheumatoid arthritis involving multiple sites with positive rheumatoid factor (HCC) - Severe, erosive rheumatoid arthritis involving multiple joints:  Patient continues to have migratory arthralgias and intermittent flares.  She has remained on IV Actemra  4 mg/kg IV infusions every 4 weeks, Plaquenil  200 mg 1 tablet by mouth twice daily Monday through Friday, and methotrexate  8 tablets by mouth once weekly.  She has been tolerating combination therapy without any side effects and has not had any recent gaps in therapy.  No recent or recurrent infections.  Overall she has found this combination of medications to be more effective than previous medication regimens but she continues to have frequent flares.  On Sunday she had a flare involving the right knee which has persisted.  Yesterday she had severe pain in the left ankle which has since subsided today.  Her disease appears to continue to be inadequately controlled despite triple therapy. On examination crackles at bilateral lung bases were noted.  Plan to order an urgent high-resolution chest CT for further evaluation due to the concern for ILD given history of severe rheumatoid arthritis.  According to the patient she has continued to have a productive cough. Can consider increasing the dose of Actemra  to 80 mg/kg IV infusions pending CT results. Plan to continue to follow the patient closely.  High risk medication use - IV Actemra  4mg /kg infusions every 4 weeks-first infusion on 11/30/2022, Plaquenil  200 mg 1 tablet by mouth twice daily Monday-Friday, Methotrexate  8 tablets by mouth once wkly, and folic acid  2 mg daily. CBC and CMP updated on 01/08/24.  TB gold negative on 10/24/23. Lipid panel updated on 01/23/24.  Discussed the importance of holding actemra  and methotrexate  if she develops signs or symptoms of an infection and to resume once the infection has completely clerared.  PLQ Eye Exam: 10/19/2022 WNL @ [facility] Follow up in 12 months.   Lung crackles: Patient has had a persistent productive cough.  On examination crackles at bilateral lung bases were noted.  An urgent high-resolution  chest CT was ordered today.  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  No active synovitis noted.  Patient has noticed progression of the ulnar deviation and deformity in both hands despite remaining on combination therapy. May consider increasing the dose of Actemra  to 8 mg/kg IV infusions--pending had resolution to CT results.   Trigger finger, right ring finger: Not currently symptomatic.   Primary osteoarthritis of  both knees: Patient has been experiencing intermittent flares in the right knee.  Her most recent flare started on Sunday.  She has been experiencing intermittent buckling.  On examination she has warmth but no effusion.  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 Burt. She underwent right foot reconstructive surgery on 08/24/2021 which was successful. She continues to have intermittent discomfort in both feet.  Yesterday she was having difficulty ambulating due to severity of pain in the left ankle but today her symptoms have subsided. Considering increasing the dose of Actemra  to 8 mg/kg IV infusions every 4 weeks.   Other medical conditions are listed as follows:  History of depression  History of pneumonia  History of hypothyroidism  History of PCOS  Orders: No orders of the defined types were placed in this encounter.  No orders of the defined types were placed in this encounter.    Follow-Up Instructions: Return in about 5 months (around 07/23/2024) for Rheumatoid arthritis.   Waddell CHRISTELLA Craze, PA-C  Note - This record has been created using Dragon software.  Chart creation errors have been sought, but may not always  have been located. Such creation errors do not reflect on  the standard of medical care.

## 2024-02-20 NOTE — Progress Notes (Signed)
 Diagnosis: Rheumatoid Arthritis  Provider:  Lonna Coder MD  Procedure: IV Infusion  IV Type: Peripheral, IV Location: L Antecubital  Actemra  (Tocilizumab ), Dose: 584 mg  Infusion Start Time: 1539  Infusion Stop Time: 1650  Post Infusion IV Care: Peripheral IV Discontinued  Discharge: Condition: Good, Destination: Home . AVS Declined  Performed by:  Maximiano JONELLE Pouch, LPN

## 2024-02-21 ENCOUNTER — Encounter: Payer: Self-pay | Admitting: Physician Assistant

## 2024-02-21 ENCOUNTER — Other Ambulatory Visit: Payer: Self-pay

## 2024-02-21 ENCOUNTER — Ambulatory Visit: Payer: Self-pay | Attending: Physician Assistant | Admitting: Physician Assistant

## 2024-02-21 VITALS — BP 133/87 | HR 57 | Resp 14 | Ht 62.0 in | Wt 292.0 lb

## 2024-02-21 DIAGNOSIS — M069 Rheumatoid arthritis, unspecified: Secondary | ICD-10-CM | POA: Insufficient documentation

## 2024-02-21 DIAGNOSIS — Z79899 Other long term (current) drug therapy: Secondary | ICD-10-CM | POA: Insufficient documentation

## 2024-02-21 DIAGNOSIS — M65341 Trigger finger, right ring finger: Secondary | ICD-10-CM | POA: Insufficient documentation

## 2024-02-21 DIAGNOSIS — M21941 Unspecified acquired deformity of hand, right hand: Secondary | ICD-10-CM | POA: Diagnosis present

## 2024-02-21 DIAGNOSIS — M21961 Unspecified acquired deformity of right lower leg: Secondary | ICD-10-CM | POA: Insufficient documentation

## 2024-02-21 DIAGNOSIS — M21962 Unspecified acquired deformity of left lower leg: Secondary | ICD-10-CM | POA: Insufficient documentation

## 2024-02-21 DIAGNOSIS — Z8639 Personal history of other endocrine, nutritional and metabolic disease: Secondary | ICD-10-CM | POA: Diagnosis present

## 2024-02-21 DIAGNOSIS — R0989 Other specified symptoms and signs involving the circulatory and respiratory systems: Secondary | ICD-10-CM | POA: Diagnosis present

## 2024-02-21 DIAGNOSIS — M170 Bilateral primary osteoarthritis of knee: Secondary | ICD-10-CM | POA: Diagnosis present

## 2024-02-21 DIAGNOSIS — Z8659 Personal history of other mental and behavioral disorders: Secondary | ICD-10-CM | POA: Diagnosis present

## 2024-02-21 DIAGNOSIS — Z8742 Personal history of other diseases of the female genital tract: Secondary | ICD-10-CM | POA: Insufficient documentation

## 2024-02-21 DIAGNOSIS — M0579 Rheumatoid arthritis with rheumatoid factor of multiple sites without organ or systems involvement: Secondary | ICD-10-CM | POA: Insufficient documentation

## 2024-02-21 DIAGNOSIS — Z8701 Personal history of pneumonia (recurrent): Secondary | ICD-10-CM | POA: Insufficient documentation

## 2024-02-21 DIAGNOSIS — M21942 Unspecified acquired deformity of hand, left hand: Secondary | ICD-10-CM | POA: Insufficient documentation

## 2024-02-21 NOTE — Progress Notes (Unsigned)
 Please review and sign

## 2024-02-24 ENCOUNTER — Telehealth: Payer: Self-pay

## 2024-02-24 NOTE — Telephone Encounter (Signed)
 Auth Submission: NO AUTH NEEDED Site of care: Site of care: [facility] Payer: [employer] med[employer] Medication & CPT/J Code(s) submitted: Actemra  (Tocilizumab ) [id number]) Diagnosis Code:  Route of submission (phone, fax, portal): fax  Phone # Fax # [phone]  Auth type: Buy/Bill PB Units/visits requested: 4mg /kg x 4 doses Reference number:  Approval from: 02/24/24 to 06/27/24   Letter from [facility] stating no prior auth is needed is in the media tab.

## 2024-02-29 ENCOUNTER — Encounter: Payer: Self-pay | Admitting: Physician Assistant

## 2024-02-29 NOTE — Telephone Encounter (Signed)
 error

## 2024-03-02 ENCOUNTER — Telehealth: Payer: Self-pay

## 2024-03-02 ENCOUNTER — Ambulatory Visit (INDEPENDENT_AMBULATORY_CARE_PROVIDER_SITE_OTHER): Admitting: Physician Assistant

## 2024-03-02 ENCOUNTER — Ambulatory Visit (HOSPITAL_BASED_OUTPATIENT_CLINIC_OR_DEPARTMENT_OTHER)
Admission: RE | Admit: 2024-03-02 | Discharge: 2024-03-02 | Disposition: A | Discharge status: Home / Self Care | Source: Ambulatory Visit | Attending: Physician Assistant | Admitting: Physician Assistant

## 2024-03-02 VITALS — BP 124/83 | HR 64

## 2024-03-02 DIAGNOSIS — Z79899 Other long term (current) drug therapy: Secondary | ICD-10-CM | POA: Insufficient documentation

## 2024-03-02 DIAGNOSIS — G8929 Other chronic pain: Secondary | ICD-10-CM | POA: Insufficient documentation

## 2024-03-02 DIAGNOSIS — M25561 Pain in right knee: Secondary | ICD-10-CM

## 2024-03-02 DIAGNOSIS — M0579 Rheumatoid arthritis with rheumatoid factor of multiple sites without organ or systems involvement: Secondary | ICD-10-CM | POA: Insufficient documentation

## 2024-03-02 DIAGNOSIS — R0989 Other specified symptoms and signs involving the circulatory and respiratory systems: Secondary | ICD-10-CM | POA: Diagnosis present

## 2024-03-02 MED ORDER — TRIAMCINOLONE ACETONIDE 40 MG/ML IJ SUSP
40.0000 mg | INTRAMUSCULAR | Status: AC | PRN
  Administered 2024-03-02: 40 mg via INTRA_ARTICULAR

## 2024-03-02 MED ORDER — LIDOCAINE HCL 1 % IJ SOLN
1.5000 mL | INTRAMUSCULAR | Status: AC | PRN
Start: 2024-03-02 — End: 2024-03-02
  Administered 2024-03-02: 1.5 mL

## 2024-03-02 NOTE — Telephone Encounter (Signed)
 Patient called the office advising that when trying to schedule her CT Chest scan they was asking for a PA she provided me with [phone] to call.

## 2024-03-02 NOTE — Progress Notes (Signed)
   Procedure Note  Patient: Shannon David             Date of Birth: 21-Oct-1990           MRN: [id number]             Visit Date: 03/02/2024  Procedures: Visit Diagnoses:  1. Chronic pain of right knee     Large Joint Inj: R knee on 03/02/2024 9:42 AM Indications: pain Details: 27 G 1.5 in needle, medial approach  Arthrogram: No  Medications: 1.5 mL lidocaine  1 %; 40 mg triamcinolone  acetonide 40 MG/ML Aspirate: 0 mL Outcome: tolerated well, no immediate complications Procedure, treatment alternatives, risks and benefits explained, specific risks discussed. Consent was given by the patient. Immediately prior to procedure a time out was called to verify the correct patient, procedure, equipment, support staff and site/side marked as required. Patient was prepped and draped in the usual sterile fashion.       Patient tolerated the procedure well.  Aftercare was discussed.  Patient was advised to notify us  if she develops any new or worsening symptoms.   Waddell Craze, PA-C

## 2024-03-02 NOTE — Telephone Encounter (Signed)
 Called and spoke with Shannon David she advised everything was good to go an there should not be anymore approval needed. She attempted to transfer me to the scheduling to see exactly was they were referencing but could not get an answer. I called the patient back an advised of what I was tod by Shannon David and for the patient to call the office an let them know the PA has already been done an approved.

## 2024-03-06 ENCOUNTER — Ambulatory Visit: Admitting: Pulmonary Disease

## 2024-03-06 ENCOUNTER — Ambulatory Visit: Payer: Self-pay | Admitting: Physician Assistant

## 2024-03-06 DIAGNOSIS — M0579 Rheumatoid arthritis with rheumatoid factor of multiple sites without organ or systems involvement: Secondary | ICD-10-CM

## 2024-03-06 DIAGNOSIS — R0989 Other specified symptoms and signs involving the circulatory and respiratory systems: Secondary | ICD-10-CM

## 2024-03-06 NOTE — Progress Notes (Signed)
 Residual mild-ill defined bronchocentric ground glass attenuation in bilateral upper lobes and right middle lobe suggestive of possible sequela from mild infectious/inflammatory process.  Recommend referral to pulmonary.   I would like to consider increasing the dose of actemra  but would like the pulmonologist's input

## 2024-03-08 ENCOUNTER — Ambulatory Visit (HOSPITAL_COMMUNITY): Payer: Self-pay | Admitting: Clinical

## 2024-03-08 ENCOUNTER — Encounter (HOSPITAL_COMMUNITY): Payer: Self-pay | Admitting: Clinical

## 2024-03-08 DIAGNOSIS — F411 Generalized anxiety disorder: Secondary | ICD-10-CM | POA: Diagnosis not present

## 2024-03-08 DIAGNOSIS — F331 Major depressive disorder, recurrent, moderate: Secondary | ICD-10-CM

## 2024-03-08 NOTE — Progress Notes (Unsigned)
 THERAPIST PROGRESS NOTE  Session Time: 10:05am-10:59am  Session #10  Participation Level: Active  Behavioral Response: Casual Alert Euthymic   Type of Therapy: Individual Therapy  Treatment Goals addressed: *** New treatment goals established, current goals reviewed:  LTG: Galia will score less than 5 on the Generalized Anxiety Disorder 7 Scale (GAD-7) STG: Laylamarie will practice problem solving skills 3 times per week for the next 4 weeks. STG: Turquoise will reduce frequency of avoidant behaviors by 50% as evidenced by self-report in therapy sessions LTG: Reduce frequency, intensity, and duration of depression symptoms so that daily functioning is improved LTG: Nyree will score less than 5 on the Patient Health Questionnaire (PHQ-9) STG: Mayerli will participate in at least 80% of scheduled therapy sessions STG: Chrisette will complete at least 80% of assigned homework STG: Eriko will identify cognitive patterns and beliefs that support depression LTG: Learn and practice communication techniques such as active listening, I statements, open-ended questions, reflective listening, assertiveness, fair fighting rules, initiating conversations, and more as necessary and taught in session.   STG: Learn emotion regulation strategies, distress tolerance skills, interpersonal effectiveness techniques, and mindfulness practices and use them in session and in life situations to improve results and satisfaction.   LTG: Process life events to the extent needed so that will be able to move forward with various areas of life in a better frame of mind per self-report.   STG: Work on forgiveness, shame, sleep, relationship to food, or other issues as appropriate and as these present during sessions.  STG: Work to Arts development officer from models like CBT, Stages of Change, DBT, shame resilience theory, ACT, SFBT, MI, trauma-informed therapy and others to be able to manage mental health symptoms, AEB  practicing out of session and reporting back.  LTG: Learn about boundary types, how to implement them, and how to enforce them so that feels more empowered and content with being able to maintain more helpful, appropriate boundaries in the future for a more balanced result.  STG: Learn breathing techniques and grounding techniques at an age-appropriate and ability-appropriate level and demonstrate mastery in session then report independent use of these skills out of session.   ProgressTowards Goals: Progressing  Interventions: Assertiveness Training and Supportive   Summary: Analee Montee is a 33 y.o. female who presents with Major Depressive Disorder that has worsened recently. She presented oriented x5 and stated she was feeling not really bad like I was, but still struggling.  CSW evaluated patient's medication compliance, use of coping tools, and self-care, as applicable.  She provided an update on various aspects of her life that are normally discussed in therapy, including ***  Suicidal/Homicidal: No without intent/plan  Therapist Response: Patient is progressing AEB engaging in scheduled therapy session.  Throughout the session, CSW gave patient the opportunity to explore thoughts and feelings associated with current life situations and past/present stressors.   CSW challenged patient gently and appropriately to consider different ways of looking at reported issues. CSW encouraged patient's expression of feelings and validated these using empathy, active listening, open body language, and unconditional positive regard.   CSW made 4 additional appointments for her.  It was agreed that we will do 3 weeks between sessions.     Plan/Recommendations: Return to next scheduled appointment on 10/16, then every 3 weeks, pursue her treatment with neurologist and pulmonologist to find out what is going on with lungs and brain and get those records to lawyers for disability, keep exercising/walking as  much as can  tolerate with rheumatoid flare-ups, try to eat more fruits/vegetables  Diagnosis:  GAD (generalized anxiety disorder)  MDD (major depressive disorder), recurrent episode, moderate (HCC)  Collaboration of Care: Psychiatrist AEB - psychiatric provider can see therapy notes as needed, therapist can read psychiatric notes  Patient/Guardian was advised Release of Information must be obtained prior to any record release in order to collaborate their care with an outside provider. Patient/Guardian was advised if they have not already done so to contact the registration department to sign all necessary forms in order for us  to release information regarding their care.   Consent: Patient/Guardian gives verbal consent for treatment and assignment of benefits for services provided during this visit. Patient/Guardian expressed understanding and agreed to proceed.   Elgie JINNY Crest, LCSW 03/08/2024

## 2024-03-09 ENCOUNTER — Other Ambulatory Visit (HOSPITAL_COMMUNITY): Payer: Self-pay | Admitting: Family

## 2024-03-12 ENCOUNTER — Ambulatory Visit (HOSPITAL_COMMUNITY)
Admission: EM | Admit: 2024-03-12 | Discharge: 2024-03-12 | Disposition: A | Discharge status: Other Acute Inpatient Hospital | Attending: Licensed Clinical Social Worker | Admitting: Licensed Clinical Social Worker

## 2024-03-12 ENCOUNTER — Encounter: Payer: Self-pay | Admitting: Pulmonary Disease

## 2024-03-12 ENCOUNTER — Other Ambulatory Visit: Payer: Self-pay

## 2024-03-12 ENCOUNTER — Emergency Department (HOSPITAL_COMMUNITY)
Admission: EM | Admit: 2024-03-12 | Discharge: 2024-03-13 | Disposition: A | Discharge status: Other Acute Inpatient Hospital | Source: Home / Self Care | Attending: Emergency Medicine | Admitting: Emergency Medicine

## 2024-03-12 ENCOUNTER — Ambulatory Visit: Admitting: Pulmonary Disease

## 2024-03-12 VITALS — BP 128/86 | HR 61 | Temp 98.4°F | Ht 62.0 in | Wt 292.0 lb

## 2024-03-12 DIAGNOSIS — F32A Depression, unspecified: Secondary | ICD-10-CM | POA: Insufficient documentation

## 2024-03-12 DIAGNOSIS — Z9152 Personal history of nonsuicidal self-harm: Secondary | ICD-10-CM | POA: Insufficient documentation

## 2024-03-12 DIAGNOSIS — F339 Major depressive disorder, recurrent, unspecified: Secondary | ICD-10-CM | POA: Insufficient documentation

## 2024-03-12 DIAGNOSIS — S51811A Laceration without foreign body of right forearm, initial encounter: Secondary | ICD-10-CM | POA: Insufficient documentation

## 2024-03-12 DIAGNOSIS — Z560 Unemployment, unspecified: Secondary | ICD-10-CM | POA: Insufficient documentation

## 2024-03-12 DIAGNOSIS — R918 Other nonspecific abnormal finding of lung field: Secondary | ICD-10-CM

## 2024-03-12 DIAGNOSIS — E039 Hypothyroidism, unspecified: Secondary | ICD-10-CM | POA: Insufficient documentation

## 2024-03-12 DIAGNOSIS — T1491XA Suicide attempt, initial encounter: Secondary | ICD-10-CM | POA: Insufficient documentation

## 2024-03-12 DIAGNOSIS — F411 Generalized anxiety disorder: Secondary | ICD-10-CM | POA: Insufficient documentation

## 2024-03-12 DIAGNOSIS — R0602 Shortness of breath: Secondary | ICD-10-CM

## 2024-03-12 DIAGNOSIS — S60221A Contusion of right hand, initial encounter: Secondary | ICD-10-CM | POA: Diagnosis not present

## 2024-03-12 DIAGNOSIS — Z79899 Other long term (current) drug therapy: Secondary | ICD-10-CM | POA: Insufficient documentation

## 2024-03-12 DIAGNOSIS — T426X2A Poisoning by other antiepileptic and sedative-hypnotic drugs, intentional self-harm, initial encounter: Secondary | ICD-10-CM | POA: Insufficient documentation

## 2024-03-12 DIAGNOSIS — M069 Rheumatoid arthritis, unspecified: Secondary | ICD-10-CM

## 2024-03-12 DIAGNOSIS — T50904A Poisoning by unspecified drugs, medicaments and biological substances, undetermined, initial encounter: Secondary | ICD-10-CM

## 2024-03-12 DIAGNOSIS — W268XXA Contact with other sharp object(s), not elsewhere classified, initial encounter: Secondary | ICD-10-CM | POA: Insufficient documentation

## 2024-03-12 DIAGNOSIS — R45851 Suicidal ideations: Secondary | ICD-10-CM

## 2024-03-12 DIAGNOSIS — X788XXA Intentional self-harm by other sharp object, initial encounter: Secondary | ICD-10-CM | POA: Insufficient documentation

## 2024-03-12 LAB — COMPREHENSIVE METABOLIC PANEL WITH GFR
ALT: 16 U/L (ref 0–44)
AST: 19 U/L (ref 15–41)
Albumin: 4.3 g/dL (ref 3.5–5.0)
Alkaline Phosphatase: 59 U/L (ref 38–126)
Anion gap: 10 (ref 5–15)
BUN: 15 mg/dL (ref 6–20)
CO2: 21 mmol/L — ABNORMAL LOW (ref 22–32)
Calcium: 9.2 mg/dL (ref 8.9–10.3)
Chloride: 102 mmol/L (ref 98–111)
Creatinine, Ser: 0.87 mg/dL (ref 0.44–1.00)
GFR, Estimated: >60 mL/min (ref >60)
Glucose, Bld: 98 mg/dL (ref 70–99)
Potassium: 3.9 mmol/L (ref 3.5–5.1)
Sodium: 133 mmol/L — ABNORMAL LOW (ref 135–145)
Total Bilirubin: 0.8 mg/dL (ref 0.0–1.2)
Total Protein: 7.2 g/dL (ref 6.5–8.1)

## 2024-03-12 LAB — CBC
HCT: 42.8 % (ref 36.0–46.0)
Hemoglobin: 14.8 g/dL (ref 12.0–15.0)
MCH: 32.4 pg (ref 26.0–34.0)
MCHC: 34.6 g/dL (ref 30.0–36.0)
MCV: 93.7 fL (ref 80.0–100.0)
Platelets: 242 K/uL (ref 150–400)
RBC: 4.57 MIL/uL (ref 3.87–5.11)
RDW: 12.2 % (ref 11.5–15.5)
WBC: 6.2 K/uL (ref 4.0–10.5)
nRBC: 0 % (ref 0.0–0.2)

## 2024-03-12 LAB — ETHANOL: Alcohol, Ethyl (B): <15 mg/dL (ref <15)

## 2024-03-12 LAB — RAPID URINE DRUG SCREEN, HOSP PERFORMED
Amphetamines: NOT DETECTED
Barbiturates: NOT DETECTED
Benzodiazepines: NOT DETECTED
Cocaine: NOT DETECTED
Opiates: NOT DETECTED
Tetrahydrocannabinol: POSITIVE — AB

## 2024-03-12 LAB — HCG, SERUM, QUALITATIVE: Preg, Serum: POSITIVE — AB

## 2024-03-12 MED ORDER — FLUTICASONE FUROATE-VILANTEROL 100-25 MCG/ACT IN AEPB: 1.0000 | INHALATION_SPRAY | Freq: Every day | RESPIRATORY_TRACT | 3 refills | Status: DC

## 2024-03-12 MED ORDER — AZITHROMYCIN 250 MG PO TABS: ORAL_TABLET | ORAL | 0 refills | Status: DC

## 2024-03-12 MED ORDER — PREDNISONE 20 MG PO TABS: 20.0000 mg | ORAL_TABLET | Freq: Every day | ORAL | 0 refills | Status: DC

## 2024-03-12 MED ORDER — TETANUS-DIPHTH-ACELL PERTUSSIS 5-2.5-18.5 LF-MCG/0.5 IM SUSY: 0.5000 mL | PREFILLED_SYRINGE | Freq: Once | INTRAMUSCULAR | Status: DC
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
## 2024-03-12 NOTE — ED Provider Notes (Signed)
 [facility] Continuous Assessment Admission H&P  Date: 03/12/24 Patient Name: Shannon David MRN: [id number] Chief Complaint: cut self and tool 8 gabapentin  tablet  Diagnoses:  Final diagnoses:  Suicide attempt [facility])  Drug overdose of undetermined intent, initial encounter  Recurrent major depressive disorder, remission status unspecified (HCC)    HPI: Shannon David, 33 y/o female with a history of MDD, GAD,  presented to [facility], voluntarily.  According to the patient she was just at [facility], and they sent her here.  Per the patient she took 8 gabapentin  when asked if she was trying to commit suicide patient stated no but she was trying to go to sleep.  Patient also has bruises and cuts to her right hand patient stated she cut herself because she was mad.  Patient does appear to be suicidal because patient mood is very depressed and does appear to be angry at times.  It is unsure if patient is forthcoming with information.  According to patient she lives with her father she quit her job because she wants to get disability.  Patient is not forthcoming with information and therefore some information was obtained from chart review.  This to face evaluation of patient, patient is alert and oriented x 4, speech clear, maintained minimal to no eye contact.  Patient appearance is fairly groomed mood is very depressed affect is flat congruent with mood.  When asked if she is suicidal she denies however patient behavior state otherwise.  Patient denies HI, AVH or paranoia.  Denies alcohol use.  Denies illicit drug use.  According to patient she is seeing a psychiatrist but the psychiatrist left the practice so she is supposed to be seeing the next person on 04/04/2024.  According to patient she has been hospitalized on multiple occasions in the past for self injury.  Patient is not forthcoming with information when asked.  Writer will be sending patient out to the ED for medical clearance    Spoke with Dr Simon COME at [facility] who will be accepting patient evaluation and medical clearance.  Pt will be going inpatient psych after medical clearance     recommended inpatient admission after medical clearance.     Total Time spent with patient: 20 minutes  Musculoskeletal  Strength & Muscle Tone: within normal limits Gait & Station: normal Patient leans: N/A  Psychiatric Specialty Exam  Presentation General Appearance:  Casual  Eye Contact: Good  Speech: Clear and Coherent  Speech Volume: Normal  Handedness: Right   Mood and Affect  Mood: Euthymic  Affect: Congruent   Thought Process  Thought Processes: Coherent  Descriptions of Associations:Intact  Orientation:Full (Time, Place and Person)  Thought Content:Logical  Diagnosis of Schizophrenia or Schizoaffective disorder in past: No   Hallucinations:Hallucinations: None  Ideas of Reference:None  Suicidal Thoughts:Suicidal Thoughts: Yes, Active SI Active Intent and/or Plan: With Intent; With Plan  Homicidal Thoughts:Homicidal Thoughts: No   Sensorium  Memory: Immediate Good  Judgment: Poor  Insight: Poor   Executive Functions  Concentration: Good  Attention Span: Good  Recall: Good  Fund of Knowledge: Fair  Language: Fair   Psychomotor Activity  Psychomotor Activity: Psychomotor Activity: Normal   Assets  Assets: Desire for Improvement   Sleep  Sleep: Sleep: Fair Number of Hours of Sleep: 7   Nutritional Assessment (For OBS and FBC admissions only) Has the patient had a weight loss or gain of 10 pounds or more in the last 3 months?: No Has the patient had a decrease in  food intake/or appetite?: No Does the patient have dental problems?: No Does the patient have eating habits or behaviors that may be indicators of an eating disorder including binging or inducing vomiting?: No Has the patient recently lost weight without trying?: 0 Has the patient  been eating poorly because of a decreased appetite?: 0 Malnutrition Screening Tool Score: 0    Physical Exam HENT:     Head: Normocephalic.     Nose: Nose normal.  Eyes:     Pupils: Pupils are equal, round, and reactive to light.  Cardiovascular:     Rate and Rhythm: Normal rate.  Pulmonary:     Effort: Pulmonary effort is normal.  Musculoskeletal:        General: Normal range of motion.     Cervical back: Normal range of motion.  Neurological:     General: No focal deficit present.     Mental Status: She is alert.  Psychiatric:        Mood and Affect: Mood normal.        Behavior: Behavior normal.        Thought Content: Thought content normal.        Judgment: Judgment normal.    Review of Systems  Constitutional: Negative.   HENT: Negative.    Eyes: Negative.   Respiratory: Negative.    Cardiovascular: Negative.   Gastrointestinal: Negative.   Genitourinary: Negative.   Musculoskeletal: Negative.   Skin: Negative.   Neurological: Negative.   Psychiatric/Behavioral:  Positive for depression and suicidal ideas. The patient is nervous/anxious.     Blood pressure (!) 147/106, pulse 71, temperature 98.6 F (37 C), temperature source Oral, resp. rate 16, last menstrual period 02/15/2024, SpO2 98%. There is no height or weight on file to calculate BMI.  Past Psychiatric History: SI, MDD, GAD  Is the patient at risk to self? Yes  Has the patient been a risk to self in the past 6 months? Yes .    Has the patient been a risk to self within the distant past? Yes   Is the patient a risk to others? No   Has the patient been a risk to others in the past 6 months? No   Has the patient been a risk to others within the distant past? No   Past Medical History: see chart   Family History: unknown  Social History: denies   Last Labs:  Orders Only on 01/23/2024  Component Date Value Ref Range Status   Cholesterol 01/23/2024 178  <200 mg/dL Final   HDL 92/71/7974 48 (L)   > OR = 50 mg/dL Final   Triglycerides 92/71/7974 91  <150 mg/dL Final   LDL Cholesterol (Calc) 01/23/2024 111 (H)  mg/dL (calc) Final   Comment: Reference range: <100 . Desirable range <100 mg/dL for primary prevention;   <70 mg/dL for patients with CHD or diabetic patients  with > or = 2 CHD risk factors. SABRA LDL-C is now calculated using the Martin-Hopkins  calculation, which is a validated novel method providing  better accuracy than the Friedewald equation in the  estimation of LDL-C.  Gladis APPLETHWAITE et al. SANDREA. 7986;689(80): 2061-2068  (http://education.[facility].com/faq/FAQ164)    Total CHOL/HDL Ratio 01/23/2024 3.7  <4.9 (calc) Final   Non-HDL Cholesterol (Calc) 01/23/2024 130 (H)  <130 mg/dL (calc) Final   Comment: For patients with diabetes plus 1 major ASCVD risk  factor, treating to a non-HDL-C goal of <100 mg/dL  (LDL-C of <29 mg/dL) is considered a therapeutic  option.   Admission on 01/08/2024, Discharged on 01/08/2024  Component Date Value Ref Range Status   WBC 01/08/2024 4.1  4.0 - 10.5 K/uL Final   RBC 01/08/2024 4.35  3.87 - 5.11 MIL/uL Final   Hemoglobin 01/08/2024 14.1  12.0 - 15.0 g/dL Final   HCT 92/86/7974 40.0  36.0 - 46.0 % Final   MCV 01/08/2024 92.0  80.0 - 100.0 fL Final   MCH 01/08/2024 32.4  26.0 - 34.0 pg Final   MCHC 01/08/2024 35.3  30.0 - 36.0 g/dL Final   RDW 92/86/7974 12.5  11.5 - 15.5 % Final   Platelets 01/08/2024 224  150 - 400 K/uL Final   nRBC 01/08/2024 0.0  0.0 - 0.2 % Final   Neutrophils Relative % 01/08/2024 63  % Final   Neutro Abs 01/08/2024 2.5  1.7 - 7.7 K/uL Final   Lymphocytes Relative 01/08/2024 23  % Final   Lymphs Abs 01/08/2024 0.9  0.7 - 4.0 K/uL Final   Monocytes Relative 01/08/2024 13  % Final   Monocytes Absolute 01/08/2024 0.5  0.1 - 1.0 K/uL Final   Eosinophils Relative 01/08/2024 0  % Final   Eosinophils Absolute 01/08/2024 0.0  0.0 - 0.5 K/uL Final   Basophils Relative 01/08/2024 1  % Final   Basophils  Absolute 01/08/2024 0.0  0.0 - 0.1 K/uL Final   Immature Granulocytes 01/08/2024 0  % Final   Abs Immature Granulocytes 01/08/2024 0.01  0.00 - 0.07 K/uL Final   Performed at [facility], [address], [place], [place] [address]   Sodium 01/08/2024 134 (L)  135 - 145 mmol/L Final   Potassium 01/08/2024 3.6  3.5 - 5.1 mmol/L Final   Chloride 01/08/2024 104  98 - 111 mmol/L Final   CO2 01/08/2024 21 (L)  22 - 32 mmol/L Final   Glucose, Bld 01/08/2024 97  70 - 99 mg/dL Final   Glucose reference range applies only to samples taken after fasting for at least 8 hours.   BUN 01/08/2024 12  6 - 20 mg/dL Final   Creatinine, Ser 01/08/2024 0.60  0.44 - 1.00 mg/dL Final   Calcium 92/86/7974 9.4  8.9 - 10.3 mg/dL Final   Total Protein 92/86/7974 7.2  6.5 - 8.1 g/dL Final   Albumin 92/86/7974 4.1  3.5 - 5.0 g/dL Final   AST 92/86/7974 29  15 - 41 U/L Final   ALT 01/08/2024 39  0 - 44 U/L Final   Alkaline Phosphatase 01/08/2024 66  38 - 126 U/L Final   Total Bilirubin 01/08/2024 0.9  0.0 - 1.2 mg/dL Final   GFR, Estimated 01/08/2024 >60  >60 mL/min Final   Comment: (NOTE) Calculated using the CKD-EPI Creatinine Equation (2021)    Anion gap 01/08/2024 9  5 - 15 Final   Performed at [facility], [address] [place], [place] [address]   Magnesium  01/08/2024 1.9  1.7 - 2.4 mg/dL Final   Performed at [facility], [address] [place], [place] [address]   TSH 01/08/2024 10.578 (H)  0.350 - 4.500 uIU/mL Final   Comment: Performed by a 3rd Generation assay with a functional sensitivity of <=0.01 uIU/mL. Performed at [facility], [address], [place], [place] [address]    Preg, Serum 01/08/2024 NEGATIVE  NEGATIVE Final   Comment:        THE SENSITIVITY OF THIS METHODOLOGY IS >10 mIU/mL. Performed at [facility], [address] [place], [place] [address]   Orders Only on 10/24/2023  Component Date Value Ref Range Status   QuantiFERON-TB Gold Plus  10/24/2023 NEGATIVE  NEGATIVE Final   Comment: Negative test result. M. tuberculosis complex  infection unlikely.    NIL 10/24/2023 0.02  IU/mL Final   Mitogen-NIL 10/24/2023 6.82  IU/mL Final   TB1-NIL 10/24/2023 0.00  IU/mL Final   TB2-NIL 10/24/2023 0.00  IU/mL Final   Comment: . The Nil tube value reflects the background interferon gamma immune response of the patient's blood sample. This value has been subtracted from the patient's displayed TB and Mitogen results. . Lower than expected results with the Mitogen tube prevent false-negative Quantiferon readings by detecting a patient with a potential immune suppressive condition and/or suboptimal pre-analytical specimen handling. . The TB1 Antigen tube is coated with the M. tuberculosis-specific antigens designed to elicit responses from TB antigen primed CD4+ helper T-lymphocytes. . The TB2 Antigen tube is coated with the M. tuberculosis-specific antigens designed to elicit responses from TB antigen primed CD4+ helper and CD8+ cytotoxic T-lymphocytes. . For additional information, please refer to https://education.quest[facility].com/faq/FAQ204 (This link is being provided for informational/ educational purposes only.) .    Glucose, Bld 10/24/2023 113 (H)  65 - 99 mg/dL Final   Comment: .            Fasting reference interval . For someone without known diabetes, a glucose value between 100 and 125 mg/dL is consistent with prediabetes and should be confirmed with a follow-up test. .    BUN 10/24/2023 11  7 - 25 mg/dL Final   Creat 95/71/7974 0.73  0.50 - 0.97 mg/dL Final   BUN/Creatinine Ratio 10/24/2023 SEE NOTE:  6 - 22 (calc) Final   Comment:    Not Reported: BUN and Creatinine are within    reference range. .    Sodium 10/24/2023 136  135 - 146 mmol/L Final   Potassium 10/24/2023 3.6  3.5 - 5.3 mmol/L Final   Chloride 10/24/2023 103  98 - 110 mmol/L Final   CO2 10/24/2023 24  20 - 32 mmol/L Final    Calcium 10/24/2023 9.0  8.6 - 10.2 mg/dL Final   Total Protein 95/71/7974 6.8  6.1 - 8.1 g/dL Final   Albumin 95/71/7974 4.2  3.6 - 5.1 g/dL Final   Globulin 95/71/7974 2.6  1.9 - 3.7 g/dL (calc) Final   AG Ratio 10/24/2023 1.6  1.0 - 2.5 (calc) Final   Total Bilirubin 10/24/2023 0.5  0.2 - 1.2 mg/dL Final   Alkaline phosphatase (APISO) 10/24/2023 68  31 - 125 U/L Final   AST 10/24/2023 15  10 - 30 U/L Final   ALT 10/24/2023 14  6 - 29 U/L Final   WBC 10/24/2023 6.6  3.8 - 10.8 Thousand/uL Final   RBC 10/24/2023 4.27  3.80 - 5.10 Million/uL Final   Hemoglobin 10/24/2023 13.7  11.7 - 15.5 g/dL Final   HCT 95/71/7974 39.6  35.0 - 45.0 % Final   MCV 10/24/2023 92.7  80.0 - 100.0 fL Final   MCH 10/24/2023 32.1  27.0 - 33.0 pg Final   MCHC 10/24/2023 34.6  32.0 - 36.0 g/dL Final   Comment: For adults, a slight decrease in the calculated MCHC value (in the range of 30 to 32 g/dL) is most likely not clinically significant; however, it should be interpreted with caution in correlation with other red cell parameters and the patient's clinical condition.    RDW 10/24/2023 12.3  11.0 - 15.0 %  Final   Platelets 10/24/2023 214  140 - 400 Thousand/uL Final   MPV 10/24/2023 11.6  7.5 - 12.5 fL Final   Neutro Abs 10/24/2023 4,851  1,500 - 7,800 cells/uL Final   Absolute Lymphocytes 10/24/2023 1,109  850 - 3,900 cells/uL Final   Absolute Monocytes 10/24/2023 620  200 - 950 cells/uL Final   Eosinophils Absolute 10/24/2023 0 (L)  15 - 500 cells/uL Final   Basophils Absolute 10/24/2023 20  0 - 200 cells/uL Final   Neutrophils Relative % 10/24/2023 73.5  % Final   Total Lymphocyte 10/24/2023 16.8  % Final   Monocytes Relative 10/24/2023 9.4  % Final   Eosinophils Relative 10/24/2023 0.0  % Final   Basophils Relative 10/24/2023 0.3  % Final    Allergies: Cephalexin  Medications:  PTA Medications  Medication Sig   Cholecalciferol  (VITAMIN D3) 50 MCG (2000 UT) capsule Take 2,000 Units by mouth  daily.   hydroxychloroquine  (PLAQUENIL ) 200 MG tablet TAKE 1 TABLET BY MOUTH TWICE A DAY MONDAY-FRIDAY   albuterol  (VENTOLIN  HFA) 108 (90 Base) MCG/ACT inhaler Inhale 1-2 puffs into the lungs every 6 (six) hours as needed for wheezing or shortness of breath.   gabapentin  (NEURONTIN ) 300 MG capsule Take 1 capsule (300 mg total) by mouth 2 (two) times daily.   levothyroxine  (SYNTHROID ) 112 MCG tablet Take 1 tablet (112 mcg total) by mouth daily.   Tocilizumab  (ACTEMRA ) 80 MG/4ML SOLN injection INFUSE 4MG /KG INTRAVENOUSLY EVERY 4 WEEKS (Body weight 123.9kg)   PARoxetine  (PAXIL ) 30 MG tablet Take 2 tablets (60 mg total) by mouth daily.   propranolol  (INDERAL ) 20 MG tablet Take 1 tablet (20 mg total) by mouth 2 (two) times daily.   Azelastine HCl 137 MCG/SPRAY SOLN Place 2 sprays into both nostrils 2 (two) times daily.   methotrexate  (RHEUMATREX) 2.5 MG tablet TAKE 8 TABLETS (20 MG TOTAL) BY MOUTH ONCE A WEEK. CAUTION:CHEMOTHERAPY. PROTECT FROM LIGHT.   hydrOXYzine  (VISTARIL ) 25 MG capsule TAKE 1 CAPSULE (25 MG TOTAL) BY MOUTH AT BEDTIME AND MAY REPEAT DOSE ONE TIME IF NEEDED.   ibuprofen  (ADVIL ) 800 MG tablet PLEASE SEE ATTACHED FOR DETAILED DIRECTIONS   losartan  (COZAAR ) 25 MG tablet Take 25 mg by mouth daily.   ondansetron  (ZOFRAN ) 4 MG tablet Take 4 mg by mouth every 8 (eight) hours as needed.   folic acid  (FOLVITE ) 1 MG tablet Take 2 mg by mouth daily.      Medical Decision Making  Will be sent to [facility] from medical clearance after which patient will be send inpatient psych for admission    Recommendations  Based on my evaluation the patient appears to have an emergency medical condition for which I recommend the patient be transferred to the emergency department for further evaluation.  Gaither Pouch, NP 03/12/24  9:38 PM

## 2024-03-12 NOTE — ED Notes (Signed)
 To [facility] via [facility] transport.  Pt A&Ox4, no distress noted. Gait slow but steady.

## 2024-03-12 NOTE — Progress Notes (Signed)
   03/12/24 2050  BH[facility] Triage Screening (Walk-ins at [facility] only)  How Did You Hear About Us ? Family/Friend  What Is the Reason for Your Visit/Call Today? Pt presents to [facility] as a voluntary walk-in, accompanied by a friend due to worsening depression, self-injury and ingestion of medication. Pt reports taking 8 Gabapentin  and 1 Abilify  a little over an hour ago. Pt denies that she was trying to take her life. PT stated  I just wanted to sleep. Pt reports relationship issues and not having her emotional support animal as immediate stressors. Pt also used a small brow razor to cut her right forearm earlier today. Pt admits that the arguement with her ex-husband made her upset.  Pt reports history of MDD and Anxiety. Pt currently denies SI,HI,AVH and substance/alcohol use.  How Long Has This Been Causing You Problems? <Week  Have You Recently Had Any Thoughts About Hurting Yourself? Yes  How long ago did you have thoughts about hurting yourself? earlier  Are You Planning to Commit Suicide/Harm Yourself At This time? No  Have you Recently Had Thoughts About Hurting Someone Sherral? No  Are You Planning To Harm Someone At This Time? No  Physical Abuse Denies  Verbal Abuse Denies  Sexual Abuse Denies  Exploitation of patient/patient's resources Denies  Self-Neglect Denies  Possible abuse reported to:  (NA)  Are you currently experiencing any auditory, visual or other hallucinations? No  Have You Used Any Alcohol or Drugs in the Past 24 Hours? Yes  What Did You Use and How Much? THC-A  Do you have any current medical co-morbidities that require immediate attention? No (Blood pressure elevated)  Clinician description of patient physical appearance/behavior: Pt is calm, cooperative, oriented  What Do You Feel Would Help You the Most Today? Treatment for Depression or other mood problem  If access to [facility] was not available, would you have sought care in the Emergency Department? Yes   Determination of Need Emergent (2 hours)  Options For Referral Other: Comment;Outpatient Therapy;Medication Management;BH Urgent Care;Inpatient Hospitalization;ED Visit  Determination of Need filed? Yes

## 2024-03-12 NOTE — ED Triage Notes (Addendum)
 Coming from [facility] where she had checked herself in for SI --> cut to right wrist. Bleeding controlled.  8 gabapentin  about 2 hours ago but unsure of the dosage along with 1 abilify . Denies HI/hallucinations. Says she's just tired and wanted to sleep. Calm/cooperative in triage, sad.  Vitals normal with EMS. Denies pain.

## 2024-03-12 NOTE — BH Assessment (Signed)
 Comprehensive Clinical Assessment (CCA) Note   03/12/2024 Shannon David [id number]  Disposition: Per Gaither Pouch, NP, patient is recommended for inpatient admission/discharge with outpatient follow-up/ overnight observation with re-evaluation in the morning.  Disposition SW to pursue appropriate inpatient options.  The patient demonstrates the following risk factors for suicide: Chronic risk factors for suicide include: previous suicide attempts  . Acute risk factors for suicide include: unemployment and social withdrawal/isolation. Protective factors for this patient include: positive social support. Considering these factors, the overall suicide risk at this point appears to be high. Patient is not appropriate for outpatient follow up.   Patient is a 33 year old female with a history of bipolar disorder who presents voluntarily to [facility] for an assessment. Patient resides in the home with dad and identifies him and her friend Shannon David as their primary support system.Patient reports isolation, crying spells, irritability, hopelessness, loss of interest to do things they enjoy, fatigue, lack of concentration, worthlessness, change in sleep, change in appetite. Patient denies NSSIB, HI, AVH. Pt reports SI and stated that she overdosed on her medications.   Patient identifies her primary stressors as her ex husband. Patient denies history of abuse or trauma. Patient denies current legal problems. Patient is receiving outpatient therapy wit Shannon David at [facility] . Patient reports that she currently is out of medications. Patient reports previous inpatient admission at [facility] for depression in Oct 2023.  Patient denies access to weapons.   Patient is can to contract for safety outside of the hospital.  Patient gives verbal consent for [facility] to speak with her friend Shannon David.  .  Per pt's friend, patient is depressed and is struggling with self harm.   Treatment options were discussed  and patient is in agreement with recommendation for inpatient hospitalization.   During evaluation pt is in no acute distress. She is alert, oriented x 4, calm, cooperative and attentive. her mood is depressed and tearful with congruent affect. She has normal speech, and behavior.  Objectively there is no evidence of psychosis/mania or delusional thinking.  Patient is able to converse coherently, goal directed thoughts, no distractibility, or pre-occupation.   She reports  suicidal/self-harm. Pt denies homicidal ideation, psychosis, and paranoia  Patient answered question appropriately.       Chief Complaint:  Chief Complaint  Patient presents with   Depression   Self-Injury   Visit Diagnosis: Bipolar     CCA Screening, Triage and Referral (STR)  Patient Reported Information How did you hear about us ? Family/Friend  What Is the Reason for Your Visit/Call Today? Pt presents to [facility] as a voluntary walk-in, accompanied by a friend due to worsening depression, self-injury and ingestion of medication. Pt reports taking 8 Gabapentin  and 1 Abilify  a little over an hour ago. Pt denies that she was trying to take her life. PT stated  I just wanted to sleep. Pt reports relationship issues and not having her emotional support animal as immediate stressors. Pt also used a small brow razor to cut her right forearm earlier today. Pt admits that the arguement with her ex-husband made her upset.  Pt reports history of MDD and Anxiety. Pt currently denies SI,HI,AVH and substance/alcohol use.  How Long Has This Been Causing You Problems? <Week  What Do You Feel Would Help You the Most Today? Treatment for Depression or other mood problem   Have You Recently Had Any Thoughts About Hurting Yourself? Yes  Are You Planning to Commit Suicide/Harm Yourself At This time?  No   Flowsheet Row ED from 01/08/2024 in [facility] Emergency Department at [facility] Counselor from 06/02/2023 in BEHAVIORAL  HEALTH INTENSIVE PSYCH Counselor from 05/06/2023 in BEHAVIORAL HEALTH PARTIAL HOSPITALIZATION PROGRAM  C-SSRS RISK CATEGORY No Risk Error: Question 6 not populated Low Risk    Have you Recently Had Thoughts About Hurting Someone Sherral? No  Are You Planning to Harm Someone at This Time? No  Explanation: Pt denies HI   Have You Used Any Alcohol or Drugs in the Past 24 Hours? Yes  How Long Ago Did You Use Drugs or Alcohol? Earlier today  What Did You Use and How Much? THC-A   Do You Currently Have a Therapist/Psychiatrist? Yes  Name of Therapist/Psychiatrist: Name of Therapist/Psychiatrist: Therapist : [name]). Pt reports that she currently does not have a psychiatrist.   Have You Been Recently Discharged From Any Office Practice or Programs? No  Explanation of Discharge From Practice/Program: n/a     CCA Screening Triage Referral Assessment Type of Contact: Face-to-Face  Telemedicine Service Delivery:   Is this Initial or Reassessment?   Date Telepsych consult ordered in [facility]:    Time Telepsych consult ordered in [facility]L:    Location of Assessment: [facility] Services  Provider Location: [facility]   Collateral Involvement: Friend ETTER [name] Ester)   Does Patient Have a Automotive engineer Guardian? No  Legal Guardian Contact Information: n/a  Copy of Legal Guardianship Form: -- (n/a)  Legal Guardian Notified of Arrival: -- (n/a)  Legal Guardian Notified of Pending Discharge: -- (n/a)  If Minor and Not Living with Parent(s), Who has Custody? n/a  Is CPS involved or ever been involved? Never  Is APS involved or ever been involved? Never   Patient Determined To Be At Risk for Harm To Self or Others Based on Review of Patient Reported Information or Presenting Complaint? Yes, for Self-Harm  Method: Plan with intent and identified person  Availability of Means: In hand or used  Intent: Intends to cause physical harm but not  necessarily death  Notification Required: No need or identified person  Additional Information for Danger to Others Potential: -- (n/a)  Additional Comments for Danger to Others Potential: n/a  Are There Guns or Other Weapons in Your Home? No  Types of Guns/Weapons: Denies access to guns  Are These Weapons Safely Secured?                            No  Who Could Verify You Are Able To Have These Secured: Denies access  Do You Have any Outstanding Charges, Pending Court Dates, Parole/Probation? Pt denies pending legal charges  Contacted To Inform of Risk of Harm To Self or Others: Other: Comment (n/a)    Does Patient Present under Involuntary Commitment? No    [place] of Residence: [place]   Patient Currently Receiving the Following Services: Individual Therapy   Determination of Need: Emergent (2 hours)   Options For Referral: Other: Comment; Outpatient Therapy; Medication Management; BH Urgent Care; Inpatient Hospitalization; ED Visit     CCA Biopsychosocial Patient Reported Schizophrenia/Schizoaffective Diagnosis in Past: No   Strengths: motivation for tx   Mental Health Symptoms Depression:  Sleep (too much or little); Tearfulness; Change in energy/activity; Fatigue; Hopelessness; Increase/decrease in appetite; Weight gain/loss; Worthlessness   Duration of Depressive symptoms: Duration of Depressive Symptoms: Greater than two weeks   Mania:  None  Anxiety:   Fatigue; Sleep   Psychosis:  None   Duration of Psychotic symptoms:    Trauma:  Avoids reminders of event   Obsessions:  N/A   Compulsions:  N/A   Inattention:  N/A   Hyperactivity/Impulsivity:  N/A   Oppositional/Defiant Behaviors:  N/A   Emotional Irregularity:  Potentially harmful impulsivity   Other Mood/Personality Symptoms:  None noted    Mental Status Exam Appearance and self-care  Stature:  Average   Weight:  Overweight   Clothing:  Casual   Grooming:  Normal    Cosmetic use:  None   Posture/gait:  Normal   Motor activity:  Not Remarkable   Sensorium  Attention:  Normal   Concentration:  Normal   Orientation:  X5   Recall/memory:  Normal   Affect and Mood  Affect:  Blunted   Mood:  Depressed   Relating  Eye contact:  Normal   Facial expression:  Sad; Tense   Attitude toward examiner:  Cooperative   Thought and Language  Speech flow: Clear and Coherent   Thought content:  Appropriate to Mood and Circumstances   Preoccupation:  None   Hallucinations:  None   Organization:  Goal-directed; Development worker, international aid of Knowledge:  Average   Intelligence:  Average   Abstraction:  Normal   Judgement:  Good   Reality Testing:  Adequate   Insight:  Good   Decision Making:  Normal   Social Functioning  Social Maturity:  Isolates; Responsible   Social Judgement:  Normal   Stress  Stressors:  Relationship; School; Work; Illness   Coping Ability:  Overwhelmed; Exhausted   Skill Deficits:  Activities of daily living   Supports:  Family; Friends/Service system     Religion: Religion/Spirituality Are You A Religious Person?: No How Might This Affect Treatment?: No effect  Leisure/Recreation: Leisure / Recreation Do You Have Hobbies?: Yes  Exercise/Diet: Exercise/Diet Do You Exercise?: No Have You Gained or Lost A Significant Amount of Weight in the Past Six Months?: No Do You Follow a Special Diet?: No Do You Have Any Trouble Sleeping?: Yes Explanation of Sleeping Difficulties: Pt reports that she has been unable to sleep   CCA Employment/Education Employment/Work Situation: Employment / Work Situation Employment Situation: Unemployed Patient's Job has Been Impacted by Current Illness: No Has Patient ever Been in Equities trader?: No  Education: Education Is Patient Currently Attending School?: No Last Grade Completed: 13 Did You Product manager?: No Did You Have An Individualized  Education Program (IIEP): No Did You Have Any Difficulty At [employer]?: No Patient's Education Has Been Impacted by Current Illness: No   CCA Family/Childhood History Family and Relationship History: Family history Marital status: Single Does patient have children?: No  Childhood History:  Childhood History By whom was/is the patient raised?: Both parents Did patient suffer any verbal/emotional/physical/sexual abuse as a child?: No Did patient suffer from severe childhood neglect?: No Has patient ever been sexually abused/assaulted/raped as an adolescent or adult?: No Was the patient ever a victim of a crime or a disaster?: No Witnessed domestic violence?: No Has patient been affected by domestic violence as an adult?: No       CCA Substance Use Alcohol/Drug Use: Alcohol / Drug Use Pain Medications: See medication list Prescriptions: See medication list Over the Counter: PRN History of alcohol / drug use?: No history of alcohol / drug abuse (She has drank alcohol but never problematically due to her Rheumatoid Arthritis and her  mother's alcoholism.  she has not drank in 8 years.) Longest period of sobriety (when/how long): N/A Negative Consequences of Use:  (N/A) Withdrawal Symptoms: None, Patient aware of relationship between substance abuse and physical/medical complications                         ASAM's:  Six Dimensions of Multidimensional Assessment  Dimension 1:  Acute Intoxication and/or Withdrawal Potential:      Dimension 2:  Biomedical Conditions and Complications:      Dimension 3:  Emotional, Behavioral, or Cognitive Conditions and Complications:     Dimension 4:  Readiness to Change:     Dimension 5:  Relapse, Continued use, or Continued Problem Potential:     Dimension 6:  Recovery/Living Environment:     ASAM Severity Score:    ASAM Recommended Level of Treatment: ASAM Recommended Level of Treatment:  (n/a)   Substance use Disorder  (SUD) Substance Use Disorder (SUD)  Checklist Symptoms of Substance Use:  (n/a)  Recommendations for Services/Supports/Treatments: Recommendations for Services/Supports/Treatments Recommendations For Services/Supports/Treatments: Individual Therapy, Medication Management, Inpatient Hospitalization  Disposition Recommendation per psychiatric provider: We recommend inpatient psychiatric hospitalization when medically cleared. Patient is under voluntary admission status at this time; please IVC if attempts to leave hospital.   DSM5 Diagnoses: Patient Active Problem List   Diagnosis Date Noted   GAD (generalized anxiety disorder) 04/22/2023   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   MDD (major depressive disorder), recurrent, in partial remission (HCC) 11/11/2022   Pain in left knee 06/04/2021   History of PCOS 02/21/2017   High risk medication use 09/21/2016   Rheumatoid arthritis involving multiple sites with positive rheumatoid factor (HCC) 09/21/2016   History of hypothyroidism 09/21/2016   History of depression 09/21/2016     Referrals to Alternative Service(s): Referred to Alternative Service(s):   Place:   Date:   Time:    Referred to Alternative Service(s):   Place:   Date:   Time:    Referred to Alternative Service(s):   Place:   Date:   Time:    Referred to Alternative Service(s):   Place:   Date:   Time:      Rosina PARAS, [place], [facility]

## 2024-03-12 NOTE — Patient Instructions (Addendum)
 I will call in a course of antibiotics and steroids for you  Will start you on a combination inhaler to stabilize the breathing passage  Continue albuterol  as needed  I will see you back in about 6 weeks  We will schedule you for breathing study  Call us  with significant concerns

## 2024-03-12 NOTE — ED Notes (Signed)
 Report called to Charge Nurse Delon, [facility].  Pending Engineer, maintenance (IT).

## 2024-03-12 NOTE — ED Provider Notes (Signed)
 [place] EMERGENCY DEPARTMENT AT [facility] Provider Note   CSN: [id number] Arrival date & time: 03/12/24  2220     Patient presents with: Drug Overdose   Shannon David is a 33 y.o. female.    Drug Overdose     33 year old female with medical history significant for rheumatoid arthritis, PCOS, major depressive disorder, hypothyroidism, suicidal behavior with attempted self-injury, anxiety presenting to the emergency department with SI and after an intentional overdose.  The patient states that she has had a very rough week emotionally and this resulted in her cutting her right forearm multiple times earlier this evening.  [facility] after intentionally overdosing on 8 tablets of 300 mg gabapentin  and 1 Abilify .  The gabapentin  is prescription however the pill flies she got from someone else's prescription.  She denies any HI, AVH.  She endorses active SI.  She is now minimizing her symptoms stating that she just took the medications because she is just tired and wanted to go to sleep..  IVC paperwork completed in triage.  Prior to Admission medications   Medication Sig Start Date End Date Taking? Authorizing Provider  albuterol  (VENTOLIN  HFA) 108 (90 Base) MCG/ACT inhaler Inhale 1-2 puffs into the lungs every 6 (six) hours as needed for wheezing or shortness of breath. 04/25/23   Tex Drilling, NP  Azelastine HCl 137 MCG/SPRAY SOLN Place 2 sprays into both nostrils 2 (two) times daily. 06/17/23   [provider]  azithromycin  (ZITHROMAX  Z-PAK) 250 MG tablet Take 2 tablets day 1 and then 1 daily for 4 days 03/12/24   Neda Jennet LABOR, MD  Cholecalciferol  (VITAMIN D3) 50 MCG (2000 UT) capsule Take 2,000 Units by mouth daily. 10/13/21   [provider]  fluticasone  furoate-vilanterol (BREO ELLIPTA ) 100-25 MCG/ACT AEPB Inhale 1 puff into the lungs daily. 03/12/24   Olalere, Jennet LABOR, MD  folic acid  (FOLVITE ) 1 MG  tablet Take 2 mg by mouth daily. 03/02/24   [provider]  gabapentin  (NEURONTIN ) 300 MG capsule Take 1 capsule (300 mg total) by mouth 2 (two) times daily. 04/25/23   Tex Drilling, NP  hydroxychloroquine  (PLAQUENIL ) 200 MG tablet TAKE 1 TABLET BY MOUTH TWICE A DAY MONDAY-FRIDAY 03/22/23   Cheryl Waddell HERO, PA-C  hydrOXYzine  (VISTARIL ) 25 MG capsule TAKE 1 CAPSULE (25 MG TOTAL) BY MOUTH AT BEDTIME AND MAY REPEAT DOSE ONE TIME IF NEEDED.    [provider]  ibuprofen  (ADVIL ) 800 MG tablet PLEASE SEE ATTACHED FOR DETAILED DIRECTIONS 08/22/21   [provider]  levothyroxine  (SYNTHROID ) 112 MCG tablet Take 1 tablet (112 mcg total) by mouth daily. 04/25/23   Tex Drilling, NP  losartan  (COZAAR ) 25 MG tablet Take 25 mg by mouth daily.    [provider]  methotrexate  (RHEUMATREX) 2.5 MG tablet TAKE 8 TABLETS (20 MG TOTAL) BY MOUTH ONCE A WEEK. CAUTION:CHEMOTHERAPY. PROTECT FROM LIGHT. 11/28/23   Dolphus Reiter, MD  ondansetron  (ZOFRAN ) 4 MG tablet Take 4 mg by mouth every 8 (eight) hours as needed.    [provider]  PARoxetine  (PAXIL ) 30 MG tablet Take 2 tablets (60 mg total) by mouth daily. 07/25/23 03/12/24  Rainelle Pfeiffer, MD  predniSONE  (DELTASONE ) 20 MG tablet Take 1 tablet (20 mg total) by mouth daily with breakfast. 03/12/24   Olalere, Jennet A, MD  propranolol  (INDERAL ) 20 MG tablet Take 1 tablet (20 mg total) by mouth 2 (two) times daily. 07/25/23 03/12/24  Rainelle Pfeiffer, MD  Tocilizumab  (  ACTEMRA ) 80 MG/4ML SOLN injection INFUSE 4MG /KG INTRAVENOUSLY EVERY 4 WEEKS (Body weight 123.9kg) 05/31/23   Cheryl Waddell HERO, PA-C    Allergies: Cephalexin    Review of Systems  Psychiatric/Behavioral:  Positive for self-injury and suicidal ideas.   All other systems reviewed and are negative.   Updated Vital Signs BP 135/89 (BP Location: Left Arm)   Pulse 61   Temp 98.7 F (37.1 C) (Oral)   Resp 18   Ht 5' 2 (1.575 m)   Wt 135.2 kg   LMP 03/12/2024  (Approximate)   SpO2 98%   BMI 54.50 kg/m   Physical Exam Vitals and nursing note reviewed.  Constitutional:      General: She is not in acute distress. HENT:     Head: Normocephalic and atraumatic.  Eyes:     Conjunctiva/sclera: Conjunctivae normal.     Pupils: Pupils are equal, round, and reactive to light.  Cardiovascular:     Rate and Rhythm: Normal rate and regular rhythm.  Pulmonary:     Effort: Pulmonary effort is normal. No respiratory distress.  Abdominal:     General: There is no distension.     Tenderness: There is no guarding.  Musculoskeletal:        General: No deformity or signs of injury.     Cervical back: Neck supple.     Comments: Multiple superficial lacerations to the right forearm, hemostatic  Skin:    Findings: No lesion or rash.  Neurological:     General: No focal deficit present.     Mental Status: She is alert. Mental status is at baseline.  Psychiatric:        Attention and Perception: Attention and perception normal.        Mood and Affect: Mood is depressed.        Speech: Speech normal.        Behavior: Behavior normal. Behavior is cooperative.        Thought Content: Thought content includes suicidal ideation.     (all labs ordered are listed, but only abnormal results are displayed) Labs Reviewed  CBC  COMPREHENSIVE METABOLIC PANEL WITH GFR  ETHANOL  RAPID URINE DRUG SCREEN, HOSP PERFORMED  HCG, SERUM, QUALITATIVE  SALICYLATE LEVEL  TSH  ACETAMINOPHEN  LEVEL    EKG: None  Radiology: No results found.   Procedures   Medications Ordered in the ED  Tdap (BOOSTRIX) injection 0.5 mL (has no administration in time range)                                    Medical Decision Making Amount and/or Complexity of Data Reviewed Labs: ordered.  Risk Prescription drug management.    33 year old female with medical history significant for rheumatoid arthritis, PCOS, major depressive disorder, hypothyroidism, suicidal behavior  with attempted self-injury, anxiety presenting to the emergency department with SI and after an intentional overdose.  The patient states that she has had a very rough week emotionally and this resulted in her cutting her right forearm multiple times earlier this evening.  [facility] after intentionally overdosing on 8 tablets of 300 mg gabapentin  and 1 Abilify .  The gabapentin  is prescription however the pill flies she got from someone else's prescription.  She denies any HI, AVH.  She endorses active SI.  She is now minimizing her symptoms stating that she just took the medications because she  is just tired and wanted to go to sleep..  IVC paperwork completed in triage.  On arrival, the patient was stable with stable vitals, endorsing SI, presenting after episode of self-harm and intentional overdose.  Patient now minimizing symptoms.  IVC paperwork completed due to concern patient may elope given current minimization.  Intentionally overdosed on 8 tablets of gabapentin , 1 tablet of Abilify , gabapentin  300 mg.  Poison control contacted.  Tetanus updated.   Poison control: Cardiac monitoring for 6 hrs, watch for hypotension, seizure, then medically cleared. EKG and screening labs ordered. Plan for supportive care. IVC paperwork filed. Signout given to Dr. Melvenia at 0000 to observe for 6 hours post ingestion (0230) until medical clearance for TTS consult.       Final diagnoses:  Overdose of undetermined intent, initial encounter  Suicidal ideation  Depression, unspecified depression type    ED Discharge Orders     None          Jerrol Agent, MD 03/12/24 2321

## 2024-03-13 ENCOUNTER — Encounter (HOSPITAL_COMMUNITY): Payer: Self-pay | Admitting: Psychiatry

## 2024-03-13 ENCOUNTER — Inpatient Hospital Stay (HOSPITAL_COMMUNITY)
Admission: AD | Admit: 2024-03-13 | Discharge: 2024-03-17 | DRG: 885 | Disposition: A | Discharge status: Home / Self Care | Source: Intra-hospital

## 2024-03-13 DIAGNOSIS — K580 Irritable bowel syndrome with diarrhea: Secondary | ICD-10-CM | POA: Diagnosis present

## 2024-03-13 DIAGNOSIS — Z8249 Family history of ischemic heart disease and other diseases of the circulatory system: Secondary | ICD-10-CM

## 2024-03-13 DIAGNOSIS — F1290 Cannabis use, unspecified, uncomplicated: Secondary | ICD-10-CM | POA: Diagnosis not present

## 2024-03-13 DIAGNOSIS — Z801 Family history of malignant neoplasm of trachea, bronchus and lung: Secondary | ICD-10-CM | POA: Diagnosis not present

## 2024-03-13 DIAGNOSIS — Z8616 Personal history of COVID-19: Secondary | ICD-10-CM

## 2024-03-13 DIAGNOSIS — E039 Hypothyroidism, unspecified: Secondary | ICD-10-CM | POA: Diagnosis present

## 2024-03-13 DIAGNOSIS — I10 Essential (primary) hypertension: Secondary | ICD-10-CM | POA: Diagnosis present

## 2024-03-13 DIAGNOSIS — F1210 Cannabis abuse, uncomplicated: Secondary | ICD-10-CM | POA: Diagnosis present

## 2024-03-13 DIAGNOSIS — T426X2A Poisoning by other antiepileptic and sedative-hypnotic drugs, intentional self-harm, initial encounter: Secondary | ICD-10-CM | POA: Diagnosis present

## 2024-03-13 DIAGNOSIS — Z8261 Family history of arthritis: Secondary | ICD-10-CM

## 2024-03-13 DIAGNOSIS — Z79890 Hormone replacement therapy (postmenopausal): Secondary | ICD-10-CM

## 2024-03-13 DIAGNOSIS — F419 Anxiety disorder, unspecified: Secondary | ICD-10-CM | POA: Diagnosis present

## 2024-03-13 DIAGNOSIS — Z635 Disruption of family by separation and divorce: Secondary | ICD-10-CM | POA: Diagnosis not present

## 2024-03-13 DIAGNOSIS — M069 Rheumatoid arthritis, unspecified: Secondary | ICD-10-CM | POA: Diagnosis present

## 2024-03-13 DIAGNOSIS — W269XXA Contact with unspecified sharp object(s), initial encounter: Secondary | ICD-10-CM | POA: Diagnosis not present

## 2024-03-13 DIAGNOSIS — F411 Generalized anxiety disorder: Secondary | ICD-10-CM | POA: Diagnosis not present

## 2024-03-13 DIAGNOSIS — F332 Major depressive disorder, recurrent severe without psychotic features: Principal | ICD-10-CM | POA: Diagnosis present

## 2024-03-13 DIAGNOSIS — F322 Major depressive disorder, single episode, severe without psychotic features: Principal | ICD-10-CM | POA: Diagnosis present

## 2024-03-13 DIAGNOSIS — Z832 Family history of diseases of the blood and blood-forming organs and certain disorders involving the immune mechanism: Secondary | ICD-10-CM | POA: Diagnosis not present

## 2024-03-13 DIAGNOSIS — Z91128 Patient's intentional underdosing of medication regimen for other reason: Secondary | ICD-10-CM

## 2024-03-13 DIAGNOSIS — Z7984 Long term (current) use of oral hypoglycemic drugs: Secondary | ICD-10-CM | POA: Diagnosis not present

## 2024-03-13 DIAGNOSIS — S51811A Laceration without foreign body of right forearm, initial encounter: Secondary | ICD-10-CM | POA: Diagnosis present

## 2024-03-13 DIAGNOSIS — Z3201 Encounter for pregnancy test, result positive: Secondary | ICD-10-CM | POA: Diagnosis present

## 2024-03-13 DIAGNOSIS — Z79899 Other long term (current) drug therapy: Secondary | ICD-10-CM | POA: Diagnosis not present

## 2024-03-13 DIAGNOSIS — F329 Major depressive disorder, single episode, unspecified: Secondary | ICD-10-CM | POA: Diagnosis not present

## 2024-03-13 DIAGNOSIS — T43226A Underdosing of selective serotonin reuptake inhibitors, initial encounter: Secondary | ICD-10-CM | POA: Diagnosis present

## 2024-03-13 DIAGNOSIS — Z818 Family history of other mental and behavioral disorders: Secondary | ICD-10-CM | POA: Diagnosis not present

## 2024-03-13 LAB — SALICYLATE LEVEL: Salicylate Lvl: <7 mg/dL — ABNORMAL LOW (ref 7.0–30.0)

## 2024-03-13 LAB — ACETAMINOPHEN LEVEL
Acetaminophen (Tylenol), Serum: <10 ug/mL — ABNORMAL LOW (ref 10–30)
Acetaminophen (Tylenol), Serum: <10 ug/mL — ABNORMAL LOW (ref 10–30)

## 2024-03-13 LAB — HCG, QUANTITATIVE, PREGNANCY: hCG, Beta Chain, Quant, S: <1 m[IU]/mL (ref <5)

## 2024-03-13 LAB — TSH: TSH: 5.563 u[IU]/mL — ABNORMAL HIGH (ref 0.350–4.500)

## 2024-03-13 MED ORDER — LEVOTHYROXINE SODIUM 112 MCG PO TABS
112.0000 ug | ORAL_TABLET | Freq: Every day | ORAL | Status: DC
  Administered 2024-03-14 – 2024-03-17 (×4): 112 ug via ORAL
  Filled 2024-03-13 (×4): qty 1

## 2024-03-13 MED ORDER — HALOPERIDOL LACTATE 5 MG/ML IJ SOLN: 5.0000 mg | Freq: Three times a day (TID) | INTRAMUSCULAR | Status: DC | PRN

## 2024-03-13 MED ORDER — PROPRANOLOL HCL 10 MG PO TABS: 20.0000 mg | ORAL_TABLET | Freq: Two times a day (BID) | ORAL | Status: DC

## 2024-03-13 MED ORDER — ACETAMINOPHEN 500 MG PO TABS
1000.0000 mg | ORAL_TABLET | Freq: Once | ORAL | Status: AC
  Administered 2024-03-13: 1000 mg via ORAL
  Filled 2024-03-13: qty 2

## 2024-03-13 MED ORDER — MAGNESIUM HYDROXIDE 400 MG/5ML PO SUSP: 30.0000 mL | Freq: Every day | ORAL | Status: DC | PRN

## 2024-03-13 MED ORDER — HALOPERIDOL LACTATE 5 MG/ML IJ SOLN: 10.0000 mg | Freq: Three times a day (TID) | INTRAMUSCULAR | Status: DC | PRN

## 2024-03-13 MED ORDER — DIPHENHYDRAMINE HCL 50 MG/ML IJ SOLN: 50.0000 mg | Freq: Three times a day (TID) | INTRAMUSCULAR | Status: DC | PRN

## 2024-03-13 MED ORDER — FLUTICASONE FUROATE-VILANTEROL 100-25 MCG/ACT IN AEPB: 1.0000 | INHALATION_SPRAY | Freq: Every day | RESPIRATORY_TRACT | Status: DC

## 2024-03-13 MED ORDER — ACETAMINOPHEN 325 MG PO TABS
650.0000 mg | ORAL_TABLET | Freq: Four times a day (QID) | ORAL | Status: DC | PRN
  Administered 2024-03-13 – 2024-03-16 (×3): 650 mg via ORAL
  Filled 2024-03-13 (×3): qty 2

## 2024-03-13 MED ORDER — ALUM & MAG HYDROXIDE-SIMETH 200-200-20 MG/5ML PO SUSP: 30.0000 mL | ORAL | Status: DC | PRN

## 2024-03-13 MED ORDER — TRAZODONE HCL 50 MG PO TABS: 100.0000 mg | ORAL_TABLET | Freq: Every evening | ORAL | Status: DC | PRN

## 2024-03-13 MED ORDER — HALOPERIDOL 5 MG PO TABS: 5.0000 mg | ORAL_TABLET | Freq: Three times a day (TID) | ORAL | Status: DC | PRN

## 2024-03-13 MED ORDER — FOLIC ACID 1 MG PO TABS
2.0000 mg | ORAL_TABLET | Freq: Every day | ORAL | Status: DC
  Administered 2024-03-14 – 2024-03-17 (×4): 2 mg via ORAL
  Filled 2024-03-13 (×4): qty 2

## 2024-03-13 MED ORDER — DIPHENHYDRAMINE HCL 25 MG PO CAPS: 50.0000 mg | ORAL_CAPSULE | Freq: Three times a day (TID) | ORAL | Status: DC | PRN

## 2024-03-13 MED ORDER — FLUTICASONE FUROATE-VILANTEROL 100-25 MCG/ACT IN AEPB
1.0000 | INHALATION_SPRAY | Freq: Every day | RESPIRATORY_TRACT | Status: DC
  Administered 2024-03-14 – 2024-03-17 (×4): 1 via RESPIRATORY_TRACT
  Filled 2024-03-13: qty 28

## 2024-03-13 MED ORDER — TRAZODONE HCL 100 MG PO TABS
100.0000 mg | ORAL_TABLET | Freq: Every evening | ORAL | Status: DC | PRN
  Administered 2024-03-13 – 2024-03-16 (×4): 100 mg via ORAL
  Filled 2024-03-13 (×5): qty 1

## 2024-03-13 MED ORDER — LOSARTAN POTASSIUM 50 MG PO TABS
25.0000 mg | ORAL_TABLET | Freq: Every day | ORAL | Status: DC
  Administered 2024-03-13: 50 mg via ORAL
  Filled 2024-03-13: qty 1

## 2024-03-13 MED ORDER — VITAMIN D 25 MCG (1000 UNIT) PO TABS: 2000.0000 [IU] | ORAL_TABLET | Freq: Every day | ORAL | Status: DC

## 2024-03-13 MED ORDER — LORAZEPAM 2 MG/ML IJ SOLN: 2.0000 mg | Freq: Three times a day (TID) | INTRAMUSCULAR | Status: DC | PRN

## 2024-03-13 MED ORDER — HYDROXYZINE HCL 25 MG PO TABS
25.0000 mg | ORAL_TABLET | Freq: Three times a day (TID) | ORAL | Status: DC | PRN
  Administered 2024-03-13 – 2024-03-16 (×4): 25 mg via ORAL
  Filled 2024-03-13 (×5): qty 1

## 2024-03-13 MED ORDER — LEVOTHYROXINE SODIUM 112 MCG PO TABS
112.0000 ug | ORAL_TABLET | Freq: Every day | ORAL | Status: DC
  Administered 2024-03-13: 112 ug via ORAL
  Filled 2024-03-13: qty 1

## 2024-03-13 MED ORDER — PAROXETINE HCL 30 MG PO TABS: 60.0000 mg | ORAL_TABLET | Freq: Every day | ORAL | Status: DC

## 2024-03-13 MED ORDER — LOSARTAN POTASSIUM 25 MG PO TABS
25.0000 mg | ORAL_TABLET | Freq: Every day | ORAL | Status: DC
  Administered 2024-03-14 – 2024-03-17 (×4): 25 mg via ORAL
  Filled 2024-03-13 (×4): qty 1

## 2024-03-13 MED ORDER — PAROXETINE HCL 20 MG PO TABS
60.0000 mg | ORAL_TABLET | Freq: Every day | ORAL | Status: DC
  Administered 2024-03-14 – 2024-03-17 (×4): 60 mg via ORAL
  Filled 2024-03-13 (×4): qty 3

## 2024-03-13 MED ORDER — VITAMIN D 25 MCG (1000 UNIT) PO TABS
2000.0000 [IU] | ORAL_TABLET | Freq: Every day | ORAL | Status: DC
  Administered 2024-03-14 – 2024-03-17 (×4): 2000 [IU] via ORAL
  Filled 2024-03-13 (×4): qty 2

## 2024-03-13 MED ORDER — FOLIC ACID 1 MG PO TABS: 2.0000 mg | ORAL_TABLET | Freq: Every day | ORAL | Status: DC

## 2024-03-13 NOTE — ED Notes (Signed)
 Valuables handed over to security.

## 2024-03-13 NOTE — ED Notes (Signed)
 Called [employer] TO TRANPORT PATIENT TO [facility]

## 2024-03-13 NOTE — ED Notes (Signed)
 IVC PAPERWORK COPIES TRANSFER PACKET FACE SHEET AND STICKER IN THE YELLOW ENVELOPE AND GIVEN TO THE NURSE CAT  FOR WHEN THE OFFICERS ARRIVE TO TRANSPORT PT TO BH VALUABLE LIST ATTACHED TO THE FRONT OF THE ENVELOPE

## 2024-03-13 NOTE — Progress Notes (Signed)
 Patient admitted to unit, A&Ox4. Patient denies SI, HI ans AVH. Pt stated during admission I wasn't trying to kill myself, I wanted to go to sleep. Reviewed unit rules and schedule with patient, pt verbalized understanding. Oriented patient to unit, food and beverage given to patient, pt sitting in her room eating. Pt denies needs at this time, q15 minute safety checks in place.

## 2024-03-13 NOTE — Progress Notes (Signed)
   03/13/24 1400  Psych Admission Type (Psych Patients Only)  Admission Status Involuntary  Psychosocial Assessment  Patient Complaints Depression;Anxiety;Crying spells  Eye Contact Brief  Facial Expression Flat  Affect Anxious  Speech Logical/coherent  Interaction Cautious  Motor Activity Slow  Appearance/Hygiene In scrubs  Behavior Characteristics Cooperative;Appropriate to situation  Mood Depressed  Thought Process  Coherency WDL  Content WDL  Delusions None reported or observed  Perception WDL  Hallucination None reported or observed  Judgment Impaired  Confusion None  Danger to Self  Current suicidal ideation? Denies  Danger to Others  Danger to Others None reported or observed

## 2024-03-13 NOTE — Group Note (Signed)
 Date:  03/13/2024 Time:  8:47 PM  Group Topic/Focus:  Wrap-Up Group:   The focus of this group is to help patients review their daily goal of treatment and discuss progress on daily workbooks.    Additional Comments:  Pt was encouraged, but opted out of attending wrap up group this evening.   Shannon David 03/13/2024, 8:47 PM

## 2024-03-13 NOTE — Group Note (Signed)
 Date:  03/13/2024 Time:  5:12 PM  Group Topic/Focus:  Emotional Education:   The focus of this group is to discuss what feelings/emotions are, and how they are experienced.    Participation Level:  Did Not Attend    Shannon David 03/13/2024, 5:12 PM

## 2024-03-13 NOTE — ED Notes (Signed)
 IVC paperwork complete and in orange zone, expires 03/19/24 @11 :31pm, case # [id number]

## 2024-03-13 NOTE — ED Notes (Signed)
 IVC is current

## 2024-03-13 NOTE — ED Provider Notes (Signed)
 Patient presenting for intentional medication overdose.  She has been IVC.  She endorses taking 2400 mg of gabapentin .  From pleasant withdrawal standpoint, she will be medically cleared at 2 AM.  Patient did have a positive pregnancy test.  Quantitative hCG is pending.  She is having some vaginal spotting. Physical Exam  BP 135/89 (BP Location: Left Arm)   Pulse 61   Temp 98.7 F (37.1 C) (Oral)   Resp 18   Ht 5' 2 (1.575 m)   Wt 135.2 kg   LMP 03/12/2024 (Approximate)   SpO2 98%   BMI 54.50 kg/m   Physical Exam Vitals and nursing note reviewed.  Constitutional:      General: She is not in acute distress.    Appearance: Normal appearance. She is well-developed. She is not ill-appearing, toxic-appearing or diaphoretic.  HENT:     Head: Normocephalic and atraumatic.     Right Ear: External ear normal.     Left Ear: External ear normal.     Nose: Nose normal.     Mouth/Throat:     Mouth: Mucous membranes are moist.  Eyes:     Extraocular Movements: Extraocular movements intact.     Conjunctiva/sclera: Conjunctivae normal.  Pulmonary:     Effort: Pulmonary effort is normal. No respiratory distress.  Abdominal:     General: There is no distension.     Palpations: Abdomen is soft.  Musculoskeletal:        General: No swelling. Normal range of motion.     Cervical back: Normal range of motion and neck supple.  Skin:    General: Skin is warm and dry.     Capillary Refill: Capillary refill takes less than 2 seconds.     Coloration: Skin is not jaundiced or pale.     Comments: Superficial, self-inflicted lacerations to right forearm.  Neurological:     General: No focal deficit present.     Mental Status: She is alert and oriented to person, place, and time.  Psychiatric:        Mood and Affect: Mood normal.        Behavior: Behavior normal.     Procedures  Procedures  ED Course / MDM    Medical Decision Making Amount and/or Complexity of Data Reviewed Labs:  ordered.  Risk Prescription drug management.   On assessment, patient resting comfortably.  She has superficial, self-inflicted laceration to her right forearm.  He states that these were done earlier this evening.  Wounds to be dressed in Xeroform and tape.  Quantitative hCG was less than 1.  Likely false positive on the qualitative test.  Patient is now medically clear.  Patient to remain in the ED for TTS consult.       Melvenia Motto, MD 03/13/24 [phone]

## 2024-03-13 NOTE — Tx Team (Signed)
 Initial Treatment Plan 03/13/2024 2:50 PM Shannon David FMW:[id number]    PATIENT STRESSORS: Financial difficulties   Health problems     PATIENT STRENGTHS: Capable of independent living  General fund of knowledge    PATIENT IDENTIFIED PROBLEMS: Depression  Financial problems                    DISCHARGE CRITERIA:  Motivation to continue treatment in a less acute level of care Verbal commitment to aftercare and medication compliance  PRELIMINARY DISCHARGE PLAN: Placement in alternative living arrangements Return to previous living arrangement  PATIENT/FAMILY INVOLVEMENT: This treatment plan has been presented to and reviewed with the patient, Shannon David. The patient has given the opportunity to ask questions and make suggestions.  Huel Mall, RN 03/13/2024, 2:50 PM

## 2024-03-13 NOTE — ED Notes (Signed)
 Case Number: [id number] IVC current as of 03/13/2024 and is set to expire on 03/20/2024.  IVC documents transferred from the orange zone to the blue zone nurse's station.  The patient is located in the yellow zone.

## 2024-03-13 NOTE — Progress Notes (Signed)
 Pt has been accepted to [facility] on 03/13/2024 Bed assignment: 405-02  Pt meets inpatient criteria per: Gaither Pouch NP  Attending Physician will be: Dr. Prentis    Report can be called to: Adult unit: [phone]  Pt can arrive after [facility] WILL UPDATE  Care Team Notified: [facility] Cherylynn Ernst RN, Elveria Batter NP, Camelia Chill RN  [place] Arial Galligan LCSW-A   03/13/2024 10:23 AM

## 2024-03-13 NOTE — Plan of Care (Signed)
   Problem: Education: Goal: Knowledge of [place] [facility] Education information/materials will improve Outcome: Progressing Goal: Emotional status will improve Outcome: Progressing Goal: Mental status will improve Outcome: Progressing Goal: Verbalization of understanding the information provided will improve Outcome: Progressing   Problem: Coping: Goal: Ability to verbalize frustrations and anger appropriately will improve Outcome: Progressing Goal: Ability to demonstrate self-control will improve Outcome: Progressing

## 2024-03-14 ENCOUNTER — Encounter (HOSPITAL_COMMUNITY): Payer: Self-pay

## 2024-03-14 MED ORDER — GABAPENTIN 300 MG PO CAPS
300.0000 mg | ORAL_CAPSULE | Freq: Two times a day (BID) | ORAL | Status: DC
  Administered 2024-03-14 – 2024-03-17 (×6): 300 mg via ORAL
  Filled 2024-03-14 (×6): qty 1

## 2024-03-14 MED ORDER — ARIPIPRAZOLE 2 MG PO TABS
2.0000 mg | ORAL_TABLET | Freq: Every day | ORAL | Status: DC
  Administered 2024-03-14 – 2024-03-17 (×4): 2 mg via ORAL
  Filled 2024-03-14 (×4): qty 1

## 2024-03-14 MED ORDER — BACITRACIN-NEOMYCIN-POLYMYXIN OINTMENT TUBE
TOPICAL_OINTMENT | CUTANEOUS | Status: DC | PRN
  Administered 2024-03-14: 1 via TOPICAL
  Filled 2024-03-14: qty 14.17

## 2024-03-14 MED ORDER — HYDROXYCHLOROQUINE SULFATE 200 MG PO TABS
200.0000 mg | ORAL_TABLET | Freq: Two times a day (BID) | ORAL | Status: DC
  Administered 2024-03-14 – 2024-03-17 (×6): 200 mg via ORAL
  Filled 2024-03-14 (×8): qty 1

## 2024-03-14 MED ORDER — METFORMIN HCL 500 MG PO TABS
500.0000 mg | ORAL_TABLET | Freq: Two times a day (BID) | ORAL | Status: DC
  Administered 2024-03-14 – 2024-03-17 (×6): 500 mg via ORAL
  Filled 2024-03-14 (×6): qty 1

## 2024-03-14 NOTE — Group Note (Signed)
 Recreation Therapy Group Note   Group Topic:Leisure Education  Group Date: 03/14/2024 Start Time: 0032 End Time: 1000 Facilitators: Jery Hollern-McCall, LRT,CTRS Location: [address] Dayroom   Group Topic: Leisure Education  Goal Area(s) Addresses:  Patient will identify positive leisure activities for use post discharge. Patient will identify at least one positive benefit of participation in leisure activities.  Patient will work effectively work with peers to keep the ball in play.  Behavioral Response: Engaged   Intervention: [employer], Music   Activity: Patients were to sit in a circle. Patients would toss a beach ball to each other keeping the ball in motion. LRT would time the group to see how long they could keep the ball moving. Patients could bounce or roll the ball but it could not come to a stop. If the ball were to stop moving, LRT would start the timer over.  Education:  Leisure Scientist, physiological, Special educational needs teacher, Teamwork, Discharge Planning  Education Outcome: Acknowledges education/In group clarification offered/Needs additional education.    Affect/Mood: Appropriate   Participation Level: Active   Participation Quality: Independent   Behavior: Appropriate   Speech/Thought Process: Focused   Insight: Good   Judgement: Good   Modes of Intervention: Cooperative Play   Patient Response to Interventions:  Engaged   Education Outcome:  In group clarification offered    Clinical Observations/Individualized Feedback: Pt was quiet and appeared reluctant to participate. Pt opened up more and engaged more during the course of group. Pt was brighter and more social with peers as well.      Plan: Continue to engage patient in RT group sessions 2-3x/week.   Malisa Ruggiero-McCall, LRT,CTRS 03/14/2024 12:46 PM

## 2024-03-14 NOTE — Group Note (Signed)
 Date:  03/14/2024 Time:  8:37 PM  Group Topic/Focus:  Wrap-Up Group:   The focus of this group is to help patients review their daily goal of treatment and discuss progress on daily workbooks.    Participation Level:  Did Not Attend  Participation Quality:  none  Affect:  none  Cognitive:  none  Insight: None  Engagement in Group:  None  Modes of Intervention:  none  Additional Comments:   Pt was encouraged but refused to attend wrap upo group   Bazil Dhanani A Nicco Reaume 03/14/2024, 8:37 PM

## 2024-03-14 NOTE — H&P (Addendum)
 Psychiatric Admission Assessment Adult  Patient Identification:  Shannon David MRN:  [id number] Date of Evaluation:  03/14/2024 Chief Complaint:  MDD (major depressive disorder), severe (HCC) [F32.2] Principal Diagnosis:  MDD (major depressive disorder), severe (HCC) Diagnosis:  Principal Problem:   MDD (major depressive disorder), severe (HCC)    CC:   I was upset  Shannon David is a 33 y.o. female  with a past psychiatric history of MDD. Patient initially arrived to [facility] on 9/15 for suicidal gesture (took 8 gabapentin  and 1 abilify ), and admitted to [facility] under IVC on 9/16 for acute safety concerns and acute suicidal or self-harming behaviors. PMHx is significant for RA, hypothyroidism, IBS, PCOS.   HPI:   On interview, patient is linear, logical and goal directed. She says that she is here because she impulsively took four pills of her gabapentin  300 mg, engaged in superficial cutting to relieve pressure, and then took another four pills of gabapentin  and one pill of another person's Abilify . She said that she took these medications for sleep and that this did not constitute a suicide attempt. She told a friend shortly afterwards and then presented voluntarily to the behavioral health urgent care 9/15. She has numerous ongoing life stressors including domestic disputes with her ex-husband regarding apportionment of proceeds on selling the house, who has possession of their dog, etc. Also had not had access to her Paxil  for 3-4 days (ran out of 90 day script provided by PCP). Has been showing signs of depression for some weeks, which worsened over that time (poor sleep, anhedonia, guilt/worthlessness, low energy and loss of appetite). She presented to [facility] last November for similar concerns (NSSI) but does not endorse previous history of suicide attempt. Denies suicidal thinking today, but says that recently she believed that she would not have minded if she were hit by a car. Does not feel  this way now. Says that she did not voice active suicidal ideation to previous providers (which is borne out in chart review). Is future-oriented: would like to get back on her medications and have some time away from the stress of her life. Amenable to voluntary 3-4 day inpatient stay for this purpose. Endorsed cannabis use every other day for anxiolytic effect, recommended cessation and gave reasoning.   Patient said that she was currently living with dad and gave unrestricted permission to reach out to him to discuss her care.  Collateral information via father, Deward [phone]): No firearms. No safety concerns. Doing real good until she ran out of her medications. Could tell on day 3-4 that she wasn't herself.    Psychiatric ROS:  Mood symptoms  As above.   Manic symptoms  Denies symptoms consistent with mania.  Anxiety symptoms  Endorsed anxiety about many things out of proportion to stressor, increased recently.   Trauma symptoms  Endorses avoidance and nightmares regarding previous trauma (emotional/sexual, in childhood).   Psychosis symptoms  Denied psychotic symptoms.   Past Psychiatric History: Current psychiatrist: None recently, only recently started medicaid Current therapist: Seeing regularly for about a year Previous psychiatric diagnoses: MDD, GAD Current psychiatric medications: Paxil  30 mg BID, gabapentin  300 mg BID Psychiatric medication history/compliance: Reports adherence, father confirms Psychiatric hospitalization(s): Adjustment disorder ISO NSSI (scratching arm with scissors) 10/24-10/29.  Psychotherapy history: Denies Neuromodulation history: Denies History of suicide (obtained from HPI): Reports no history of suicide attempts, borne out in chart review History of homicide or aggression (obtained in HPI): None known  Substance Abuse History: Alcohol: Limited Tobacco: Denies  Cannabis: Every other day IV drug use: Denies Prescription drug use:  Denies Other illicit drugs: Denies Rehab history: Denies  Past Medical History: PCP: Jyothi Medical diagnoses: RA, hypothyroidism, IBS, PCOS, HTN Medications: Hydroxychloroquine , methotrexate  1/week, losartan , levothyroxine  112 mcg qday, vitamin d3, breo elliptaalbuterol, propranolol  20 mg BID, Trazodone  100 mg, gabapentin  300 mg BID Allergies: Keflex Hospitalizations: Denies Surgeries: Foot surgeries Trauma: Denies Seizures: ?PNES vs Seizure activity over the last year, neuro workup negative, did not make followup  Social History: Living situation: With dad Education: Some college Occupational history: applying for disability Marital status: Single Children: Denies Legal: Denies Military: None  Access to firearms: Patient and family denies  Family Psychiatric History: Psychiatric diagnoses: Mother bipolar; father bipolar depression Suicide history: Denies Violence/aggression: Denies Substance use history: Denies  Family Medical History:  None pertinent   Total Time spent with patient: 3 hours  Is the patient at risk to self? No.  Has the patient been a risk to self in the past 6 months? No.  Has the patient been a risk to self within the distant past? No.   Is the patient a risk to others? No.  Has the patient been a risk to others in the past 6 months? No.  Has the patient been a risk to others within the distant past? No.   [place] Scale:  Flowsheet Row Ad[facility] (Current) [facility] from 03/12/2024 in [facility] at [facility] ED from 01/08/2024 in [facility] Emergency Department at [facility]  C-SSRS RISK CATEGORY Low Risk High Risk No Risk     Tobacco Screening:  Social History   Tobacco Use  Smoking Status Never   Passive exposure: Current  Smokeless Tobacco Never    BH Tobacco Counseling     Are you interested in Tobacco Cessation Medications?  No, patient  refused Counseled patient on smoking cessation:  Refused/Declined practical counseling Reason Tobacco Screening Not Completed: Patient Refused Screening       Social History:  Social History   Substance and Sexual Activity  Alcohol Use No     Social History   Substance and Sexual Activity  Drug Use No    Additional Social History: Marital status: Separated Are you sexually active?: No    Allergies:   Allergies  Allergen Reactions   Cephalexin Hives    Keflex   Lab Results:  Results for orders placed or performed during the hospital encounter of 03/12/24 (from the past 48 hours)  Comprehensive metabolic panel     Status: Abnormal   Collection Time: 03/12/24 10:40 PM  Result Value Ref Range   Sodium 133 (L) 135 - 145 mmol/L   Potassium 3.9 3.5 - 5.1 mmol/L   Chloride 102 98 - 111 mmol/L   CO2 21 (L) 22 - 32 mmol/L   Glucose, Bld 98 70 - 99 mg/dL    Comment: Glucose reference range applies only to samples taken after fasting for at least 8 hours.   BUN 15 6 - 20 mg/dL   Creatinine, Ser 9.12 0.44 - 1.00 mg/dL   Calcium 9.2 8.9 - 89.6 mg/dL   Total Protein 7.2 6.5 - 8.1 g/dL   Albumin 4.3 3.5 - 5.0 g/dL   AST 19 15 - 41 U/L   ALT 16 0 - 44 U/L   Alkaline Phosphatase 59 38 - 126 U/L   Total Bilirubin 0.8 0.0 - 1.2 mg/dL   GFR, Estimated >39 >  60 mL/min    Comment: (NOTE) Calculated using the CKD-EPI Creatinine Equation (2021)    Anion gap 10 5 - 15    Comment: Performed at [facility] Lab, [address], [place], [place] [address]  Ethanol     Status: None   Collection Time: 03/12/24 10:40 PM  Result Value Ref Range   Alcohol, Ethyl (B) <15 <15 mg/dL    Comment: (NOTE) For medical purposes only. Performed at [facility], [address] [place], [place] [address]   cbc     Status: None   Collection Time: 03/12/24 10:40 PM  Result Value Ref Range   WBC 6.2 4.0 - 10.5 K/uL   RBC 4.57 3.87 - 5.11 MIL/uL   Hemoglobin 14.8 12.0 - 15.0 g/dL   HCT  57.1 63.9 - 53.9 %   MCV 93.7 80.0 - 100.0 fL   MCH 32.4 26.0 - 34.0 pg   MCHC 34.6 30.0 - 36.0 g/dL   RDW 87.7 88.4 - 84.4 %   Platelets 242 150 - 400 K/uL   nRBC 0.0 0.0 - 0.2 %    Comment: Performed at [facility] Lab, [address], [place], [place] [address]  Rapid urine drug screen (hospital performed)     Status: Abnormal   Collection Time: 03/12/24 10:40 PM  Result Value Ref Range   Opiates NONE DETECTED NONE DETECTED   Cocaine NONE DETECTED NONE DETECTED   Benzodiazepines NONE DETECTED NONE DETECTED   Amphetamines NONE DETECTED NONE DETECTED   Tetrahydrocannabinol POSITIVE (A) NONE DETECTED   Barbiturates NONE DETECTED NONE DETECTED    Comment: (NOTE) DRUG SCREEN FOR MEDICAL PURPOSES ONLY.  IF CONFIRMATION IS NEEDED FOR ANY PURPOSE, NOTIFY LAB WITHIN 5 DAYS.  LOWEST DETECTABLE LIMITS FOR URINE DRUG SCREEN Drug Class                     Cutoff (ng/mL) Amphetamine and metabolites    1000 Barbiturate and metabolites    200 Benzodiazepine                 200 Opiates and metabolites        300 Cocaine and metabolites        300 THC                            50 Performed at [facility], [address], [place], [place] [address]   hCG, serum, qualitative     Status: Abnormal   Collection Time: 03/12/24 10:40 PM  Result Value Ref Range   Preg, Serum POSITIVE (A) NEGATIVE    Comment:        THE SENSITIVITY OF THIS METHODOLOGY IS >10 mIU/mL. Performed at [facility], [address], [place], [place] [address]   Acetaminophen  level     Status: Abnormal   Collection Time: 03/12/24 10:40 PM  Result Value Ref Range   Acetaminophen  (Tylenol ), Serum <10 (L) 10 - 30 ug/mL    Comment: (NOTE) Therapeutic concentrations vary significantly. A range of 10-30 ug/mL  may be an effective concentration for many patients. However, some  are best treated at concentrations outside of this range. Acetaminophen  concentrations >150 ug/mL at 4 hours after ingestion   and >50 ug/mL at 12 hours after ingestion are often associated with  toxic reactions.  Performed at [facility] Lab, [address], [place], [place] [address]  TSH     Status: Abnormal   Collection Time: 03/12/24 10:40 PM  Result Value Ref Range   TSH 5.563 (H) 0.350 - 4.500 uIU/mL    Comment: Performed by a 3rd Generation assay with a functional sensitivity of <=0.01 uIU/mL. Performed at [facility], [address], [place], [place] [address]   Salicylate level     Status: Abnormal   Collection Time: 03/12/24 10:40 PM  Result Value Ref Range   Salicylate Lvl <7.0 (L) 7.0 - 30.0 mg/dL    Comment: Performed at [facility], [address], [place], [place] [address]  hCG, quantitative, pregnancy     Status: None   Collection Time: 03/13/24 12:52 AM  Result Value Ref Range   hCG, Beta Chain, Quant, S <1 <5 mIU/mL    Comment:          GEST. AGE      CONC.  (mIU/mL)   <=1 WEEK        5 - 50     2 WEEKS       50 - 500     3 WEEKS       100 - 10,000     4 WEEKS     1,000 - 30,000     5 WEEKS     3,500 - 115,000   6-8 WEEKS     12,000 - 270,000    12 WEEKS     15,000 - 220,000        FEMALE AND NON-PREGNANT FEMALE:     LESS THAN 5 mIU/mL Performed at [facility], [address], [place], [place] [address]   Acetaminophen  level     Status: Abnormal   Collection Time: 03/13/24 12:52 AM  Result Value Ref Range   Acetaminophen  (Tylenol ), Serum <10 (L) 10 - 30 ug/mL    Comment: (NOTE) Therapeutic concentrations vary significantly. A range of 10-30 ug/mL  may be an effective concentration for many patients. However, some  are best treated at concentrations outside of this range. Acetaminophen  concentrations >150 ug/mL at 4 hours after ingestion  and >50 ug/mL at 12 hours after ingestion are often associated with  toxic reactions.  Performed at [facility], [address], [place], [place] [address]     Blood alcohol level:  Lab Results   Component Value Date   [facility] <15 03/12/2024   ETH <10 11/11/2022    Metabolic disorder labs:  No results found for: HGBA1C, MPG No results found for: PROLACTIN Lab Results  Component Value Date   CHOL 178 01/23/2024   TRIG 91 01/23/2024   HDL 48 (L) 01/23/2024   CHOLHDL 3.7 01/23/2024   VLDL 80.1 07/21/2023   LDLCALC 111 (H) 01/23/2024   LDLCALC 95 07/21/2023    Current Medications: Current Facility-Administered Medications  Medication Dose Route Frequency Provider Last Rate Last Admin   acetaminophen  (TYLENOL ) tablet 650 mg  650 mg Oral Q6H PRN Coleman, Carolyn H, NP   650 mg at 03/13/24 1609   alum & mag hydroxide-simeth (MAALOX/MYLANTA) 200-200-20 MG/5ML suspension 30 mL  30 mL Oral Q4H PRN Coleman, Carolyn H, NP       ARIPiprazole  (ABILIFY ) tablet 2 mg  2 mg Oral Daily Rollene Katz, MD       cholecalciferol  (VITAMIN D3) 25 MCG (1000 UNIT) tablet 2,000 Units  2,000 Units Oral Daily Coleman, Carolyn H, NP   2,000 Units at 03/14/24 0901   haloperidol  (HALDOL ) tablet  5 mg  5 mg Oral TID PRN Mardy Elveria DEL, NP       And   diphenhydrAMINE  (BENADRYL ) capsule 50 mg  50 mg Oral TID PRN Coleman, Carolyn H, NP       haloperidol  lactate (HALDOL ) injection 5 mg  5 mg Intramuscular TID PRN Mardy Elveria DEL, NP       And   diphenhydrAMINE  (BENADRYL ) injection 50 mg  50 mg Intramuscular TID PRN Mardy Elveria DEL, NP       And   LORazepam  (ATIVAN ) injection 2 mg  2 mg Intramuscular TID PRN Coleman, Carolyn H, NP       haloperidol  lactate (HALDOL ) injection 10 mg  10 mg Intramuscular TID PRN Mardy Elveria DEL, NP       And   diphenhydrAMINE  (BENADRYL ) injection 50 mg  50 mg Intramuscular TID PRN Mardy Elveria DEL, NP       And   LORazepam  (ATIVAN ) injection 2 mg  2 mg Intramuscular TID PRN Mardy Elveria DEL, NP       fluticasone  furoate-vilanterol (BREO ELLIPTA ) 100-25 MCG/ACT 1 puff  1 puff Inhalation Daily Mardy Elveria DEL, NP   1 puff at 03/14/24 0901   folic acid   (FOLVITE ) tablet 2 mg  2 mg Oral Daily Coleman, Carolyn H, NP   2 mg at 03/14/24 0901   gabapentin  (NEURONTIN ) capsule 300 mg  300 mg Oral BID Rollene Katz, MD       hydroxychloroquine  (PLAQUENIL ) tablet 200 mg  200 mg Oral BID Rollene Katz, MD       hydrOXYzine  (ATARAX ) tablet 25 mg  25 mg Oral TID PRN Coleman, Carolyn H, NP   25 mg at 03/13/24 2020   levothyroxine  (SYNTHROID ) tablet 112 mcg  112 mcg Oral Daily Mardy Elveria DEL, NP   112 mcg at 03/14/24 9376   losartan  (COZAAR ) tablet 25 mg  25 mg Oral Daily Coleman, Carolyn H, NP   25 mg at 03/14/24 0625   magnesium  hydroxide (MILK OF MAGNESIA) suspension 30 mL  30 mL Oral Daily PRN Mardy Elveria DEL, NP       metFORMIN  (GLUCOPHAGE ) tablet 500 mg  500 mg Oral BID WC Rollene Katz, MD       PARoxetine  (PAXIL ) tablet 60 mg  60 mg Oral Daily Coleman, Carolyn H, NP   60 mg at 03/14/24 0901   traZODone  (DESYREL ) tablet 100 mg  100 mg Oral QHS PRN Coleman, Carolyn H, NP   100 mg at 03/13/24 2020    PTA Medications: Medications Prior to Admission  Medication Sig Dispense Refill Last Dose/Taking   albuterol  (VENTOLIN  HFA) 108 (90 Base) MCG/ACT inhaler Inhale 1-2 puffs into the lungs every 6 (six) hours as needed for wheezing or shortness of breath. 1 each 0    azithromycin  (ZITHROMAX  Z-PAK) 250 MG tablet Take 2 tablets day 1 and then 1 daily for 4 days 6 each 0    Cholecalciferol  (VITAMIN D3) 50 MCG (2000 UT) capsule Take 2,000 Units by mouth daily.      fluticasone  furoate-vilanterol (BREO ELLIPTA ) 100-25 MCG/ACT AEPB Inhale 1 puff into the lungs daily. (Patient not taking: Reported on 03/13/2024) 60 each 3    folic acid  (FOLVITE ) 1 MG tablet Take 2 mg by mouth daily.      gabapentin  (NEURONTIN ) 300 MG capsule Take 1 capsule (300 mg total) by mouth 2 (two) times daily. (Patient taking differently: Take 300 mg by mouth 3 (three) times daily.) 60 capsule 0  hydroxychloroquine  (PLAQUENIL ) 200 MG tablet TAKE 1 TABLET BY MOUTH TWICE A  DAY MONDAY-FRIDAY 120 tablet 0    hydrOXYzine  (VISTARIL ) 25 MG capsule TAKE 1 CAPSULE (25 MG TOTAL) BY MOUTH AT BEDTIME AND MAY REPEAT DOSE ONE TIME IF NEEDED.      levothyroxine  (SYNTHROID ) 112 MCG tablet Take 1 tablet (112 mcg total) by mouth daily. 30 tablet 0    losartan  (COZAAR ) 25 MG tablet Take 25 mg by mouth daily.      methotrexate  (RHEUMATREX) 2.5 MG tablet TAKE 8 TABLETS (20 MG TOTAL) BY MOUTH ONCE A WEEK. CAUTION:CHEMOTHERAPY. PROTECT FROM LIGHT. 96 tablet 0    ondansetron  (ZOFRAN ) 4 MG tablet Take 4 mg by mouth every 8 (eight) hours as needed.      PARoxetine  (PAXIL ) 30 MG tablet Take 2 tablets (60 mg total) by mouth daily. 60 tablet 2    predniSONE  (DELTASONE ) 20 MG tablet Take 1 tablet (20 mg total) by mouth daily with breakfast. (Patient not taking: Reported on 03/13/2024) 7 tablet 0    propranolol  (INDERAL ) 20 MG tablet Take 1 tablet (20 mg total) by mouth 2 (two) times daily. 60 tablet 2    Tocilizumab  (ACTEMRA ) 80 MG/4ML SOLN injection INFUSE 4MG /KG INTRAVENOUSLY EVERY 4 WEEKS (Body weight 123.9kg) 28 mL 5    traZODone  (DESYREL ) 100 MG tablet Take 100 mg by mouth at bedtime.       Psychiatric Specialty Exam:  Presentation   General Appearance:  Casual; Appropriate for Environment  Eye Contact:  Fair  Speech:  Clear and Coherent  Speech Volume:  Normal  Handedness:  Right   Mood and Affect   Mood:  Depressed  Affect:  Flat; Congruent   Thinking   Thought Processes:  Coherent  Descriptions of Associations:  Intact  Orientation:  Full (Time, Place and Person)  Thought Content:  Logical  History of Schizophrenia/Schizoaffective disorder:  No   Duration of Psychotic Symptoms: N/A  Hallucinations:  None  Ideas of Reference:  None  Suicidal Thoughts:  Yes, Passive (as of yesterday, would be okay with being hit by a car, denies at present) With Intent; With Plan Without Intent  Homicidal Thoughts:  No   Sensorium    Memory:   Immediate Good  Judgment:  Good  Insight:  Fair   Therapist, art:  Fair  Attention Span:  Fair  Recall:  Fair  Fund of Knowledge:  Fair  Language:  Fair   Psychomotor Activity:  Normal    Assets:  Desire for Improvement    Sleep:  Good 7.75    Physical Exam Vitals reviewed.  Constitutional:      General: She is not in acute distress.    Appearance: She is not ill-appearing.  Pulmonary:     Effort: Pulmonary effort is normal. No respiratory distress.  Neurological:     Mental Status: She is alert.    Review of Systems  Constitutional:  Negative for chills and fever.  All other systems reviewed and are negative.  Blood pressure (!) 135/107, pulse (!) 101, temperature 97.9 F (36.6 C), temperature source Oral, resp. rate 19, height 5' 2 (1.575 m), weight 131.1 kg, last menstrual period 02/15/2024, SpO2 99%. Body mass index is 52.86 kg/m.   Treatment Plan Summary: Daily contact with patient to assess and evaluate symptoms and progress in treatment and Medication management   ASSESSMENT:   Patient is a 32 year old female who presents with worsening MDD and NSSI behavior  possibly constituting a ?suicidal gesture. Endorsed recent passive SI in setting of being without her medication and with social stressors. Patient agreeable to 3-4 day voluntary stay for med management. Does not appear acutely suicidal, denied that taking these gabapentin  was an attempt on her life, and father denies having heard any suicidal thinking from patient. Shows good insight and was able to reach out to friend after acting impulsively. Amenable to low-dose Abilify  for mood stabilization, side effects discussed. Also amenable to starting metformin  500 mg for antipsychotic metabolic symptomatology. Patient has numerous medical comorbities and we have restarted her home medications for hypothyroidism, RA, etc. Some concern for cluster B traits but also  possible WNL given very stressful situation regarding ex-husband. Worth exploring in outpatient setting. Patient may meet PTSD criteria, should also investigate this although it appears that this is not the foremost presenting symptom at this time. Was considering switching from Paxil  to another, less side-effect-heavy, SSRI but had already received this medication. Now has medicaid, will need setup with outpatient referral.   Diagnoses / Active Problems: Major depressive disorder, severe, recurring, in current episode GAD Cannabis Use   PLAN:  Safety and Monitoring: -  VOLUNTARY  admission to inpatient psychiatric unit for safety, stabilization and treatment. - Daily contact with patient to assess and evaluate symptoms and progress in treatment - Patient's case to be discussed in multi-disciplinary team meeting -  Observation Level : q15 minute checks -  Vital signs:  q12 hours -  Precautions: suicide, elopement, and assault  2. Psychiatric Diagnoses and Treatment:    # Major depressive disorder, severe, recurrent, in current episode #GAD #Cannabis use disorder - Start abilify  2 mg qday for mood stability and to augment SSRI. - Change Paxil  30 mg BID --> 60 mg qday to simplify home regimen. - Restart home gabapentin  300 mg BID for anxiety - Start Metformin  500 mg BID for antipsychotic-induced weight gain - Counseled cannabis cessation  - The risks/benefits/side-effects/alternatives to this medication were discussed in detail with the patient and time was given for questions. The patient consents to medication trial.  - Metabolic profile and EKG monitoring obtained while on an atypical antipsychotic  BMI: 52.86 TSH: 5.5 Lipid panel: LDL 111 (1 month ago) HbgA1c: ordered, awaiting QTc: 401 - Encouraged patient to participate in unit milieu and in scheduled group therapies  - Short Term Goals: Ability to identify changes in lifestyle to reduce recurrence of condition will improve,  Ability to verbalize feelings will improve, Ability to disclose and discuss suicidal ideas, and Ability to demonstrate self-control will improve - Long Term Goals: Improvement in symptoms so as ready for discharge  Other PRNS: agitation, mild pain, sleep, GI upset  Other labs reviewed on admission: HcG quant negative. Tylenol /salicylate negative. Na 133, ETOH negative, CBC WNL, +THC   3. Medical Issues Being Addressed:   # Vitamin D  deficiency - Home vitamin D   # Hypothyroidism TSH 5.5 H - Continue home levothyroxine  112 mcg  # HTN - Restart home losartan , propranolol    - Consider addition of amlodipine if continued to be elevated, may be transient 2/2 stress   # RA - Restarted hydroxycholoroquine BID  4. Discharge Planning:   - Estimated discharge date: 3-5 days - Social work and case management to assist with discharge planning and identification of hospital follow-up needs prior to discharge. - Discharge concerns: Need to establish a safety plan; medication compliance and effectiveness. - Discharge goals: Return home with outpatient referrals for mental health follow-up including  medication management/psychotherapy.  I certify that inpatient services furnished can reasonably be expected to improve the patient's condition.    NB: This note was created using a voice recognition software as a result there may be grammatical errors inadvertently enclosed that do not reflect the nature of this encounter. Every attempt is made to correct such errors.   Odis Cleveland, MD PGY-2, Psychiatry Residency  9/17/20252:27 PM

## 2024-03-14 NOTE — Progress Notes (Signed)
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
## 2024-03-14 NOTE — BH IP Treatment Plan (Signed)
 Interdisciplinary Treatment and Diagnostic Plan Update  03/14/2024 Time of Session: 10:15 AM  Shannon David MRN: [id number]  Principal Diagnosis: MDD (major depressive disorder), severe (HCC)  Secondary Diagnoses: Principal Problem:   MDD (major depressive disorder), severe (HCC)   Current Medications:  Current Facility-Administered Medications  Medication Dose Route Frequency Provider Last Rate Last Admin   acetaminophen  (TYLENOL ) tablet 650 mg  650 mg Oral Q6H PRN Coleman, Carolyn H, NP   650 mg at 03/13/24 1609   alum & mag hydroxide-simeth (MAALOX/MYLANTA) 200-200-20 MG/5ML suspension 30 mL  30 mL Oral Q4H PRN Mardy Elveria DEL, NP       cholecalciferol  (VITAMIN D3) 25 MCG (1000 UNIT) tablet 2,000 Units  2,000 Units Oral Daily Coleman, Carolyn H, NP   2,000 Units at 03/14/24 0901   haloperidol  (HALDOL ) tablet 5 mg  5 mg Oral TID PRN Mardy Elveria DEL, NP       And   diphenhydrAMINE  (BENADRYL ) capsule 50 mg  50 mg Oral TID PRN Mardy Elveria DEL, NP       haloperidol  lactate (HALDOL ) injection 5 mg  5 mg Intramuscular TID PRN Mardy Elveria DEL, NP       And   diphenhydrAMINE  (BENADRYL ) injection 50 mg  50 mg Intramuscular TID PRN Mardy Elveria DEL, NP       And   LORazepam  (ATIVAN ) injection 2 mg  2 mg Intramuscular TID PRN Coleman, Carolyn H, NP       haloperidol  lactate (HALDOL ) injection 10 mg  10 mg Intramuscular TID PRN Mardy Elveria DEL, NP       And   diphenhydrAMINE  (BENADRYL ) injection 50 mg  50 mg Intramuscular TID PRN Mardy Elveria DEL, NP       And   LORazepam  (ATIVAN ) injection 2 mg  2 mg Intramuscular TID PRN Mardy Elveria DEL, NP       fluticasone  furoate-vilanterol (BREO ELLIPTA ) 100-25 MCG/ACT 1 puff  1 puff Inhalation Daily Mardy Elveria DEL, NP   1 puff at 03/14/24 0901   folic acid  (FOLVITE ) tablet 2 mg  2 mg Oral Daily Mardy Elveria DEL, NP   2 mg at 03/14/24 0901   hydrOXYzine  (ATARAX ) tablet 25 mg  25 mg Oral TID PRN Coleman, Carolyn H, NP   25 mg  at 03/13/24 2020   levothyroxine  (SYNTHROID ) tablet 112 mcg  112 mcg Oral Daily Mardy Elveria DEL, NP   112 mcg at 03/14/24 9376   losartan  (COZAAR ) tablet 25 mg  25 mg Oral Daily Coleman, Carolyn H, NP   25 mg at 03/14/24 0625   magnesium  hydroxide (MILK OF MAGNESIA) suspension 30 mL  30 mL Oral Daily PRN Mardy Elveria DEL, NP       PARoxetine  (PAXIL ) tablet 60 mg  60 mg Oral Daily Coleman, Carolyn H, NP   60 mg at 03/14/24 0901   traZODone  (DESYREL ) tablet 100 mg  100 mg Oral QHS PRN Coleman, Carolyn H, NP   100 mg at 03/13/24 2020   PTA Medications: Medications Prior to Admission  Medication Sig Dispense Refill Last Dose/Taking   albuterol  (VENTOLIN  HFA) 108 (90 Base) MCG/ACT inhaler Inhale 1-2 puffs into the lungs every 6 (six) hours as needed for wheezing or shortness of breath. 1 each 0    azithromycin  (ZITHROMAX  Z-PAK) 250 MG tablet Take 2 tablets day 1 and then 1 daily for 4 days 6 each 0    Cholecalciferol  (VITAMIN D3) 50 MCG (2000 UT) capsule Take 2,000 Units by mouth daily.  fluticasone  furoate-vilanterol (BREO ELLIPTA ) 100-25 MCG/ACT AEPB Inhale 1 puff into the lungs daily. (Patient not taking: Reported on 03/13/2024) 60 each 3    folic acid  (FOLVITE ) 1 MG tablet Take 2 mg by mouth daily.      gabapentin  (NEURONTIN ) 300 MG capsule Take 1 capsule (300 mg total) by mouth 2 (two) times daily. (Patient taking differently: Take 300 mg by mouth 3 (three) times daily.) 60 capsule 0    hydroxychloroquine  (PLAQUENIL ) 200 MG tablet TAKE 1 TABLET BY MOUTH TWICE A DAY MONDAY-FRIDAY 120 tablet 0    hydrOXYzine  (VISTARIL ) 25 MG capsule TAKE 1 CAPSULE (25 MG TOTAL) BY MOUTH AT BEDTIME AND MAY REPEAT DOSE ONE TIME IF NEEDED.      levothyroxine  (SYNTHROID ) 112 MCG tablet Take 1 tablet (112 mcg total) by mouth daily. 30 tablet 0    losartan  (COZAAR ) 25 MG tablet Take 25 mg by mouth daily.      methotrexate  (RHEUMATREX) 2.5 MG tablet TAKE 8 TABLETS (20 MG TOTAL) BY MOUTH ONCE A WEEK.  CAUTION:CHEMOTHERAPY. PROTECT FROM LIGHT. 96 tablet 0    ondansetron  (ZOFRAN ) 4 MG tablet Take 4 mg by mouth every 8 (eight) hours as needed.      PARoxetine  (PAXIL ) 30 MG tablet Take 2 tablets (60 mg total) by mouth daily. 60 tablet 2    predniSONE  (DELTASONE ) 20 MG tablet Take 1 tablet (20 mg total) by mouth daily with breakfast. (Patient not taking: Reported on 03/13/2024) 7 tablet 0    propranolol  (INDERAL ) 20 MG tablet Take 1 tablet (20 mg total) by mouth 2 (two) times daily. 60 tablet 2    Tocilizumab  (ACTEMRA ) 80 MG/4ML SOLN injection INFUSE 4MG /KG INTRAVENOUSLY EVERY 4 WEEKS (Body weight 123.9kg) 28 mL 5    traZODone  (DESYREL ) 100 MG tablet Take 100 mg by mouth at bedtime.       Patient Stressors: Financial difficulties   Health problems    Patient Strengths: Capable of independent living  General fund of knowledge   Treatment Modalities: Medication Management, Group therapy, Case management,  1 to 1 session with clinician, Psychoeducation, Recreational therapy.   Physician Treatment Plan for Primary Diagnosis: MDD (major depressive disorder), severe (HCC) Long Term Goal(s):     Short Term Goals:    Medication Management: Evaluate patient's response, side effects, and tolerance of medication regimen.  Therapeutic Interventions: 1 to 1 sessions, Unit Group sessions and Medication administration.  Evaluation of Outcomes: Not Progressing  Physician Treatment Plan for Secondary Diagnosis: Principal Problem:   MDD (major depressive disorder), severe (HCC)  Long Term Goal(s):     Short Term Goals:       Medication Management: Evaluate patient's response, side effects, and tolerance of medication regimen.  Therapeutic Interventions: 1 to 1 sessions, Unit Group sessions and Medication administration.  Evaluation of Outcomes: Not Progressing   RN Treatment Plan for Primary Diagnosis: MDD (major depressive disorder), severe (HCC) Long Term Goal(s): Knowledge of disease and  therapeutic regimen to maintain health will improve  Short Term Goals: Ability to remain free from injury will improve, Ability to verbalize frustration and anger appropriately will improve, Ability to demonstrate self-control, Ability to participate in decision making will improve, Ability to verbalize feelings will improve, Ability to disclose and discuss suicidal ideas, Ability to identify and develop effective coping behaviors will improve, and Compliance with prescribed medications will improve  Medication Management: RN will administer medications as ordered by provider, will assess and evaluate patient's response and provide education to patient for  prescribed medication. RN will report any adverse and/or side effects to prescribing provider.  Therapeutic Interventions: 1 on 1 counseling sessions, Psychoeducation, Medication administration, Evaluate responses to treatment, Monitor vital signs and CBGs as ordered, Perform/monitor CIWA, COWS, AIMS and Fall Risk screenings as ordered, Perform wound care treatments as ordered.  Evaluation of Outcomes: Not Progressing   LCSW Treatment Plan for Primary Diagnosis: MDD (major depressive disorder), severe (HCC) Long Term Goal(s): Safe transition to appropriate next level of care at discharge, Engage patient in therapeutic group addressing interpersonal concerns.  Short Term Goals: Engage patient in aftercare planning with referrals and resources, Increase social support, Increase ability to appropriately verbalize feelings, Increase emotional regulation, Facilitate acceptance of mental health diagnosis and concerns, Facilitate patient progression through stages of change regarding substance use diagnoses and concerns, Identify triggers associated with mental health/substance abuse issues, and Increase skills for wellness and recovery  Therapeutic Interventions: Assess for all discharge needs, 1 to 1 time with Social worker, Explore available resources  and support systems, Assess for adequacy in community support network, Educate family and significant other(s) on suicide prevention, Complete Psychosocial Assessment, Interpersonal group therapy.  Evaluation of Outcomes: Not Progressing   Progress in Treatment: Attending groups: attended some groups Participating in groups: Yes. Taking medication as prescribed: Yes. Toleration medication: Yes. Family/Significant other contact made: No, will contact:  Deward (father) [phone] Patient understands diagnosis: Yes. Discussing patient identified problems/goals with staff: Yes. Medical problems stabilized or resolved: Yes. Denies suicidal/homicidal ideation: Yes. Issues/concerns per patient self-inventory: No.  New problem(s) identified:  No  New Short Term/Long Term Goal(s):    medication stabilization, elimination of SI thoughts, development of comprehensive mental wellness plan.    Patient Goals:  I don't have any goals.  Discharge Plan or Barriers:  Patient recently admitted. CSW will continue to follow and assess for appropriate referrals and possible discharge planning.    Reason for Continuation of Hospitalization: Depression Medication stabilization Suicidal ideation  Estimated Length of Stay:  3 - 5 days  Last 3 [place] Suicide Severity Risk Score: Flowsheet Row [facility] 03[facility]12/2024 in [facility] Department [facility] ED from 01/08/2024 in [facility] Department at [facility]  C-SSRS RISK CATEGORY Low Risk High Risk No Risk    Last PHQ 2/9 Scores:    08/17/2023    8:54 AM 06/02/2023    8:30 AM 05/25/2023    9:47 AM  Depression screen PHQ 2/9  Decreased Interest 0 1 1  Down, Depressed, Hopeless 0 2 2  PHQ - 2 Score 0 3 3  Altered sleeping  3 3  Tired, decreased energy  3 2  Change in appetite  3 2  Feeling bad or failure about yourself   3  1  Trouble concentrating  1 1  Moving slowly or fidgety/restless  0 1  Suicidal thoughts  2 1  PHQ-9 Score  18 14  Difficult doing work/chores  Somewhat difficult Somewhat difficult    Scribe for Treatment Team: Holley Kocurek O Camey Edell, LCSWA 03/14/2024 12:53 PM
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
## 2024-03-14 NOTE — Progress Notes (Signed)
   03/13/24 2300  Psych Admission Type (Psych Patients Only)  Admission Status Involuntary  Psychosocial Assessment  Patient Complaints Anxiety;Crying spells;Depression  Eye Contact Brief  Facial Expression Flat;Sad  Affect Anxious  Speech Logical/coherent  Interaction Cautious  Motor Activity Slow  Appearance/Hygiene In scrubs  Behavior Characteristics Appropriate to situation  Mood Depressed  Thought Process  Coherency WDL  Content WDL  Delusions None reported or observed  Perception WDL  Hallucination None reported or observed  Judgment Poor  Confusion None  Danger to Self  Current suicidal ideation? Denies  Danger to Others  Danger to Others None reported or observed

## 2024-03-14 NOTE — Plan of Care (Signed)
  Problem: Education: Goal: Knowledge of [facility] Education information/materials will improve Outcome: Progressing   Problem: Activity: Goal: Interest or engagement in activities will improve Outcome: Progressing   Problem: Coping: Goal: Ability to demonstrate self-control will improve Outcome: Progressing   Problem: Safety: Goal: Periods of time without injury will increase Outcome: Progressing

## 2024-03-14 NOTE — Group Note (Signed)
 Date:  03/14/2024 Time:  6:20 PM  Group Topic/Focus:  Developing a Wellness Toolbox:   The focus of this group is to help patients develop a wellness toolbox with skills and strategies to promote recovery upon discharge.    Participation Level:  Active  Participation Quality:  Appropriate  Affect:  Appropriate  Cognitive:  Appropriate  Insight: Appropriate  Engagement in Group:  Engaged  Modes of Intervention:  Discussion   Hero Mccathern 03/14/2024, 6:20 PM

## 2024-03-14 NOTE — Progress Notes (Signed)
(  Sleep Hours) -7.75 (Any PRNs that were needed, meds refused, or side effects to meds)- Trazodone , Vistaril  (Any disturbances and when (visitation, over night)- none (Concerns raised by the patient)- none (SI/HI/AVH)- denies

## 2024-03-14 NOTE — Group Note (Signed)
 Date:  03/14/2024 Time:  9:57 AM  Group Topic/Focus:  Goals Group:   The focus of this group is to help patients establish daily goals to achieve during treatment and discuss how the patient can incorporate goal setting into their daily lives to aide in recovery.    Participation Level:  Did Not Attend  Participation Quality:  na  Affect:  na  Cognitive:  na  Insight: None  Engagement in Group:  na  Modes of Intervention:  na  Additional Comments:  na  Nat Rummer 03/14/2024, 9:57 AM

## 2024-03-14 NOTE — Progress Notes (Signed)
   03/14/24 0900  Psych Admission Type (Psych Patients Only)  Admission Status Involuntary  Psychosocial Assessment  Patient Complaints Crying spells;Anxiety;Depression  Eye Contact Brief  Facial Expression Sad  Affect Anxious  Speech Logical/coherent  Interaction Cautious  Motor Activity Slow  Appearance/Hygiene Meticulous  Behavior Characteristics Appropriate to situation  Mood Depressed  Thought Process  Coherency WDL  Content WDL  Delusions None reported or observed  Perception WDL  Hallucination None reported or observed  Judgment Poor  Confusion None  Danger to Self  Current suicidal ideation? Denies  Danger to Others  Danger to Others None reported or observed

## 2024-03-14 NOTE — Plan of Care (Signed)
  Problem: Education: Goal: Knowledge of [facility] Education information/materials will improve Outcome: Progressing Goal: Verbalization of understanding the information provided will improve Outcome: Progressing   Problem: Coping: Goal: Ability to demonstrate self-control will improve Outcome: Progressing   Problem: Health Behavior/Discharge Planning: Goal: Identification of resources available to assist in meeting health care needs will improve Outcome: Progressing

## 2024-03-14 NOTE — Plan of Care (Signed)
   Problem: Education: Goal: Knowledge of [facility] Education information/materials will improve Outcome: Progressing Goal: Emotional status will improve Outcome: Progressing Goal: Mental status will improve Outcome: Progressing

## 2024-03-15 LAB — HEMOGLOBIN A1C
Hgb A1c MFr Bld: 4.5 % — ABNORMAL LOW (ref 4.8–5.6)
Mean Plasma Glucose: 82.45 mg/dL

## 2024-03-15 MED ORDER — PROPRANOLOL HCL 10 MG PO TABS
20.0000 mg | ORAL_TABLET | Freq: Two times a day (BID) | ORAL | Status: DC
  Administered 2024-03-15 – 2024-03-17 (×4): 20 mg via ORAL
  Filled 2024-03-15 (×4): qty 2

## 2024-03-15 NOTE — Group Note (Signed)
 Date:  03/15/2024 Time:  8:43 PM  Group Topic/Focus:  Wrap-Up Group:   The focus of this group is to help patients review their daily goal of treatment and discuss progress on daily workbooks.    Participation Level:  Active  Participation Quality:  Appropriate and Sharing  Affect:  Appropriate and Flat  Cognitive:  Appropriate  Insight: Appropriate and Limited  Engagement in Group:  Engaged  Modes of Intervention:  Activity and Socialization  Additional Comments:  Patient used wrap-up group sheets. Patient rated her day a 9 out of 10. Patient goal for today, med management. Patient did achieve goal. Coping skills the patient finds most helpful, [id number] method. Something the patient likes about herself, I'm sweet.   Eward Mace 03/15/2024, 8:43 PM

## 2024-03-15 NOTE — Group Note (Signed)
 Recreation Therapy Group Note   Group Topic:Other  Group Date: 03/15/2024 Start Time: 1405 End Time: 1450 Facilitators: Jacklyn Branan-McCall, LRT,CTRS Location: [address] Dayroom   Activity Description/Intervention: Therapeutic Drumming. Patients with peers and staff were given the opportunity to engage in a leader facilitated [facility] Group Empowerment Drumming Circle with staff from the [employer], in partnership with [employer]. Teaching laboratory technician and trained [employer], Norleen Mon leading with LRT observing and documenting intervention and pt response. This evidenced-based practice targets 7 areas of health and wellbeing in the human experience including: stress-reduction, exercise, self-expression, camaraderie/support, nurturing, spirituality, and music-making (leisure).   Goal Area(s) Addresses:  Patient will engage in pro-social way in music group.  Patient will follow directions of drum leader on the first prompt. Patient will demonstrate no behavioral issues during group.  Patient will identify if a reduction in stress level occurs as a result of participation in therapeutic drum circle.    Education: Leisure exposure, Pharmacologist, Musical expression, Discharge Planning   Affect/Mood: Appropriate   Participation Level: Engaged   Participation Quality: Independent   Behavior: Appropriate   Speech/Thought Process: Focused   Insight: Good   Judgement: Good   Modes of Intervention: Teaching laboratory technician   Patient Response to Interventions:  Engaged   Education Outcome:  In group clarification offered    Clinical Observations/Individualized Feedback: [facility] David actively engaged in therapeutic drumming exercise and discussions. Pt was appropriate with peers, staff, and musical equipment for duration of programming.  Pt identified optimistic as their feeling after participation in music-based programming. Pt affect congruent with  verbalized emotion.    Plan: Continue to engage patient in RT group sessions 2-3x/week.   Raun Routh-McCall, LRT,CTRS 03/15/2024 2:44 PM

## 2024-03-15 NOTE — Progress Notes (Signed)
   03/15/24 1000  Psych Admission Type (Psych Patients Only)  Admission Status Voluntary  Psychosocial Assessment  Patient Complaints Anxiety  Eye Contact Brief  Facial Expression Sad  Affect Anxious  Speech Logical/coherent  Interaction Cautious  Motor Activity Slow  Appearance/Hygiene Unremarkable  Behavior Characteristics Appropriate to situation  Mood Depressed  Thought Process  Coherency WDL  Content WDL  Delusions None reported or observed  Perception WDL  Hallucination None reported or observed  Judgment Poor  Confusion None  Danger to Self  Current suicidal ideation? Denies  Description of Suicide Plan No Plan  Agreement Not to Harm Self Yes  Description of Agreement Verbal Contract  Danger to Others  Danger to Others None reported or observed

## 2024-03-15 NOTE — Group Note (Signed)
 LCSW Group Therapy Note   Group Date: 03/15/2024 Start Time: 1100 End Time: 1200   Participation:  patient was present.  She listened and was respectful, however didn't participate in the discussion.    Type of Therapy:  Group Therapy  Topic:  Money Matters: Creating Stability, Confidence and Peace of Mind  Objective: To help participants understand the impact of financial stability on well-being through the lens of Maslow's Hierarchy of Needs and develop practical strategies for budgeting, saving, and debt repayment.  Goals: Increase awareness of spending habits and financial priorities, recognizing how money supports basic needs, security, and relationships. Develop simple budgeting and saving strategies to enhance stability and peace of mind.  Reduce financial stress by creating a realistic debt repayment plan, supporting long-term confidence and well-being.  Summary:  Participants explored how financial stability connects to basic needs, relationships, and self-esteem using Maslow's Hierarchy. They discussed budgeting, saving, and debt repayment strategies, identifying small, manageable changes. Through interactive discussion and self-reflection, they gained insight into their financial habits and created personal action steps for improvement.  Therapeutic Modalities Used: Elements of Cognitive Behavioral Therapy (CBT) - Addressing financial stress and thought patterns. Psychoeducation - Engineer, agricultural. Elements of Motivational Interviewing (MI) - Encouraging realistic, achievable changes. Group Support - Reducing shame and stress through shared experiences.   Tung Pustejovsky O Gwenyth Dingee, LCSWA 03/15/2024  4:57 PM

## 2024-03-15 NOTE — Group Note (Signed)
 Occupational Therapy Group Note  Group Topic:Stress Management  Group Date: 03/15/2024 Start Time: 1500 End Time: 1536 Facilitators: Dot [place] MATSU, OT   Group Description: Group encouraged increased participation and engagement through discussion focused on topic of stress management. Patients engaged interactively to discuss components of stress including physical signs, emotional signs, negative management strategies, and positive management strategies. Each individual identified one new stress management strategy they would like to try moving forward.    Therapeutic Goals: Identify current stressors Identify healthy vs unhealthy stress management strategies/techniques Discuss and identify physical and emotional signs of stress   Participation Level: Engaged   Participation Quality: Independent   Behavior: Appropriate   Speech/Thought Process: Relevant   Affect/Mood: Appropriate   Insight: Fair   Judgement: Fair      Modes of Intervention: Education  Patient Response to Interventions:  Attentive   Plan: Continue to engage patient in OT groups 2 - 3x/week.  03/15/2024  [place] MATSU Dot, OT   Haillee Johann, OT

## 2024-03-15 NOTE — Plan of Care (Signed)
   Problem: Education: Goal: Knowledge of [facility] Education information/materials will improve Outcome: Progressing Goal: Emotional status will improve Outcome: Progressing Goal: Mental status will improve Outcome: Progressing

## 2024-03-15 NOTE — Plan of Care (Signed)
   Problem: Education: Goal: Emotional status will improve Outcome: Progressing Goal: Mental status will improve Outcome: Progressing Goal: Verbalization of understanding the information provided will improve Outcome: Progressing   Problem: Activity: Goal: Interest or engagement in activities will improve Outcome: Progressing

## 2024-03-15 NOTE — Progress Notes (Signed)
(  Sleep Hours) -9.5 (Any PRNs that were needed, meds refused, or side effects to meds)- prn hydroxyzine  and trazodone  @ 2213 (Any disturbances and when (visitation, over night)-none (Concerns raised by the patient)- none (SI/HI/AVH)- Denies all

## 2024-03-15 NOTE — [facility] Group Notes (Signed)
 The focus of this group is to help patients establish daily goals to achieve during treatment and discuss how the patient can incorporate goal setting into their daily lives to aide in recovery.      Scale 1-10: 7      Goal for the day: Medication management

## 2024-03-15 NOTE — Progress Notes (Signed)
 [facility] Progress Note  03/15/2024 1:35 PM [facility]  MRN:  [id number]  Principal Problem: MDD (major depressive disorder), severe (HCC) Diagnosis: Principal Problem:   MDD (major depressive disorder), severe (HCC)   Reason for Admission:  Shannon David is a 33 y.o. female  with a past psychiatric history of MDD. Patient initially arrived to [facility] on 9/15 for suicidal gesture (took 8 gabapentin  and 1 abilify ), and admitted to [facility] under IVC on 9/16 for acute safety concerns and acute suicidal or self-harming behaviors. PMHx is significant for RA, hypothyroidism, IBS, PCOS.  (admitted on 03/13/2024, total  LOS: 2 days )   Overnight events: 140/88. Slept 9.5  Hydroxyzine  25 mg x1. Neosporin x1. Trazodone  100 mg x1. A1c, lipid.   On interview: Patient says that she feels more calm today. Likely related to being removed from stressors. Attempting groups. Asked if we could restart home Propranolol  20 mg BID. RA pain at baseline. Denies SI and HI today. Denied AVH. Denied medication side effects. Discussed titration of Abilify  to 5 mg qday in a couple of days. Denied GI side effects of metformin . Denies symptoms consistent with akathisia.    Past Psychiatric History: Current psychiatrist: None recently, only recently started medicaid Current therapist: Seeing regularly for about a year Previous psychiatric diagnoses: MDD, GAD Current psychiatric medications: Paxil  30 mg BID, gabapentin  300 mg BID Psychiatric medication history/compliance: Reports adherence, father confirms Psychiatric hospitalization(s): Adjustment disorder ISO NSSI (scratching arm with scissors) 10/24-10/29.  Psychotherapy history: Denies Neuromodulation history: Denies History of suicide (obtained from HPI): Reports no history of suicide attempts, borne out in chart review History of homicide or aggression (obtained in HPI): None known   Substance Abuse History: Alcohol: Limited Tobacco: Denies Cannabis: Every  other day IV drug use: Denies Prescription drug use: Denies Other illicit drugs: Denies Rehab history: Denies   Past Medical History: PCP: Jyothi Medical diagnoses: RA, hypothyroidism, IBS, PCOS, HTN Medications: Hydroxychloroquine , methotrexate  1/week, losartan , levothyroxine  112 mcg qday, vitamin d3, breo elliptaalbuterol, propranolol  20 mg BID, Trazodone  100 mg, gabapentin  300 mg BID Allergies: Keflex Hospitalizations: Denies Surgeries: Foot surgeries Trauma: Denies Seizures: ?PNES vs Seizure activity over the last year, neuro workup negative, did not make followup   Social History: Living situation: With dad Education: Some college Occupational history: applying for disability Marital status: Single Children: Denies Legal: Denies Military: None   Access to firearms: Patient and family denies   Family Psychiatric History: Psychiatric diagnoses: Mother bipolar; father bipolar depression Suicide history: Denies Violence/aggression: Denies Substance use history: Denies   Family Medical History:   None pertinent    Total Time spent with patient: 3 hours   Is the patient at risk to self? No.  Has the patient been a risk to self in the past 6 months? No.  Has the patient been a risk to self within the distant past? No.   Is the patient a risk to others? No.  Has the patient been a risk to others in the past 6 months? No.  Has the patient been a risk to others within the distant past? No.   Current Medications: Current Facility-Administered Medications  Medication Dose Route Frequency Provider Last Rate Last Admin   acetaminophen  (TYLENOL ) tablet 650 mg  650 mg Oral Q6H PRN Coleman, Carolyn H, NP   650 mg at 03/15/24 1207   alum & mag hydroxide-simeth (MAALOX/MYLANTA) 200-200-20 MG/5ML suspension 30 mL  30 mL Oral Q4H PRN Coleman, Carolyn H, NP  ARIPiprazole  (ABILIFY ) tablet 2 mg  2 mg Oral Daily Rollene Katz, MD   2 mg at 03/15/24 0841   cholecalciferol   (VITAMIN D3) 25 MCG (1000 UNIT) tablet 2,000 Units  2,000 Units Oral Daily Coleman, Carolyn H, NP   2,000 Units at 03/15/24 9157   haloperidol  (HALDOL ) tablet 5 mg  5 mg Oral TID PRN Mardy Elveria DEL, NP       And   diphenhydrAMINE  (BENADRYL ) capsule 50 mg  50 mg Oral TID PRN Mardy Elveria DEL, NP       haloperidol  lactate (HALDOL ) injection 5 mg  5 mg Intramuscular TID PRN Mardy Elveria DEL, NP       And   diphenhydrAMINE  (BENADRYL ) injection 50 mg  50 mg Intramuscular TID PRN Mardy Elveria DEL, NP       And   LORazepam  (ATIVAN ) injection 2 mg  2 mg Intramuscular TID PRN Coleman, Carolyn H, NP       haloperidol  lactate (HALDOL ) injection 10 mg  10 mg Intramuscular TID PRN Mardy Elveria DEL, NP       And   diphenhydrAMINE  (BENADRYL ) injection 50 mg  50 mg Intramuscular TID PRN Mardy Elveria DEL, NP       And   LORazepam  (ATIVAN ) injection 2 mg  2 mg Intramuscular TID PRN Mardy Elveria DEL, NP       fluticasone  furoate-vilanterol (BREO ELLIPTA ) 100-25 MCG/ACT 1 puff  1 puff Inhalation Daily Mardy Elveria H, NP   1 puff at 03/15/24 0841   folic acid  (FOLVITE ) tablet 2 mg  2 mg Oral Daily Coleman, Carolyn H, NP   2 mg at 03/15/24 9158   gabapentin  (NEURONTIN ) capsule 300 mg  300 mg Oral BID Rollene Katz, MD   300 mg at 03/15/24 9158   hydroxychloroquine  (PLAQUENIL ) tablet 200 mg  200 mg Oral BID Rollene Katz, MD   200 mg at 03/15/24 9157   hydrOXYzine  (ATARAX ) tablet 25 mg  25 mg Oral TID PRN Coleman, Carolyn H, NP   25 mg at 03/14/24 2213   levothyroxine  (SYNTHROID ) tablet 112 mcg  112 mcg Oral Daily Mardy Elveria DEL, NP   112 mcg at 03/15/24 9383   losartan  (COZAAR ) tablet 25 mg  25 mg Oral Daily Coleman, Carolyn H, NP   25 mg at 03/15/24 9158   magnesium  hydroxide (MILK OF MAGNESIA) suspension 30 mL  30 mL Oral Daily PRN Coleman, Carolyn H, NP       metFORMIN  (GLUCOPHAGE ) tablet 500 mg  500 mg Oral BID WC Rollene Katz, MD   500 mg at 03/15/24 9158    neomycin -bacitracin -polymyxin (NEOSPORIN) ointment   Topical PRN Rollene Katz, MD   1 Application at 03/14/24 1704   PARoxetine  (PAXIL ) tablet 60 mg  60 mg Oral Daily Coleman, Carolyn H, NP   60 mg at 03/15/24 9158   propranolol  (INDERAL ) tablet 20 mg  20 mg Oral BID Rollene Katz, MD       traZODone  (DESYREL ) tablet 100 mg  100 mg Oral QHS PRN Mardy Elveria DEL, NP   100 mg at 03/14/24 2213    Lab Results:  Results for orders placed or performed during the hospital encounter of 03/13/24 (from the past 48 hours)  Hemoglobin A1c     Status: Abnormal   Collection Time: 03/15/24  6:16 AM  Result Value Ref Range   Hgb A1c MFr Bld 4.5 (L) 4.8 - 5.6 %    Comment: (NOTE) Diagnosis of Diabetes The following HbA1c ranges  recommended by the American Diabetes Association (ADA) may be used as an aid in the diagnosis of diabetes mellitus.  Hemoglobin             Suggested A1C NGSP%              Diagnosis  <5.7                   Non Diabetic  5.7-6.4                Pre-Diabetic  >6.4                   Diabetic  <7.0                   Glycemic control for                       adults with diabetes.     Mean Plasma Glucose 82.45 mg/dL    Comment: Performed at [facility], [address], [place], [place] [address]    Blood Alcohol level:  Lab Results  Component Value Date   [facility] <15 03/12/2024   ETH <10 11/11/2022    Metabolic Labs: Lab Results  Component Value Date   HGBA1C 4.5 (L) 03/15/2024   MPG 82.45 03/15/2024   No results found for: PROLACTIN Lab Results  Component Value Date   CHOL 178 01/23/2024   TRIG 91 01/23/2024   HDL 48 (L) 01/23/2024   CHOLHDL 3.7 01/23/2024   VLDL 80.1 07/21/2023   LDLCALC 111 (H) 01/23/2024   LDLCALC 95 07/21/2023    Physical Findings: AIMS: No  CIWA:    COWS:     Psychiatric Specialty Exam:  Presentation   General Appearance: Casual  Eye Contact: Good  Speech: Clear and Coherent  Speech Volume:  Normal  Handedness: Right   Mood and Affect   Mood: Euthymic  Affect: Flat   Thinking   Thought Processes: Linear  Descriptions of Associations: Intact  Orientation: Full (Time, Place and Person)  Thought Content: Logical  History of Schizophrenia/Schizoaffective disorder: No   Duration of Psychotic Symptoms: N/A  Hallucinations: None  Ideas of Reference: None  Suicidal Thoughts: No With Intent; With Plan Without Intent  Homicidal Thoughts: No   Sensorium    Memory: Immediate Fair  Judgment: Good  Insight: Fair   Therapist, art: Fair  Attention Span: Fair  Recall: Fair  Fund of Knowledge: Fair  Language: Fair   Psychomotor Activity: Normal    Assets: Desire for Improvement    Sleep: Good 9.5    Physical Exam ROS Blood pressure (!) 140/88, pulse 84, temperature 98.4 F (36.9 C), temperature source Oral, resp. rate 20, height 5' 2 (1.575 m), weight 131.1 kg, last menstrual period 02/15/2024, SpO2 98%. Body mass index is 52.86 kg/m.   Treatment Plan Summary: Daily contact with patient to assess and evaluate symptoms and progress in treatment and Medication management     ASSESSMENT:    Patient is a 33 year old female who presents with worsening MDD and NSSI behavior possibly constituting a ?suicidal gesture. Endorsed recent passive SI in setting of being without her medication and with social stressors. Patient agreeable to 3-4 day voluntary stay for med management. Does not appear acutely suicidal, denied that taking these gabapentin  was an attempt on her life, and father denies having heard any suicidal thinking from patient. Shows good insight and was  able to reach out to friend after acting impulsively. Amenable to low-dose Abilify  for mood stabilization, side effects discussed. Also amenable to starting metformin  500 mg for antipsychotic metabolic symptomatology. Patient has numerous medical comorbities  and we have restarted her home medications for hypothyroidism, RA, etc. Some concern for cluster B traits but also possible WNL given very stressful situation regarding ex-husband. Worth exploring in outpatient setting. Patient may meet PTSD criteria, should also investigate this although it appears that this is not the foremost presenting symptom at this time. Was considering switching from Paxil  to another, less side-effect-heavy, SSRI but had already received this medication. Now has medicaid, will need setup with outpatient referral.   Patient more calm today, going to groups. No new SI today. Amenable to further medication titration -- no GI side effects from metformin . Likely discharge in 2-4 days, increase Abilify  2 mg --> 5 mg qday tomorrow or Saturday.    Diagnoses / Active Problems: Major depressive disorder, severe, recurring, in current episode GAD Cannabis Use    PLAN:   Safety and Monitoring: -  VOLUNTARY  admission to inpatient psychiatric unit for safety, stabilization and treatment. - Daily contact with patient to assess and evaluate symptoms and progress in treatment - Patient's case to be discussed in multi-disciplinary team meeting -  Observation Level : q15 minute checks -  Vital signs:  q12 hours -  Precautions: suicide, elopement, and assault   2. Psychiatric Diagnoses and Treatment:     # Major depressive disorder, severe, recurrent, in current episode #GAD #Cannabis use disorder - Continue abilify  2 mg qday for mood stability and to augment SSRI. - Change Paxil  30 mg BID --> 60 mg qday to simplify home regimen. - Restart home gabapentin  300 mg BID for anxiety - Start Metformin  500 mg BID for antipsychotic-induced weight gain - Counseled cannabis cessation  - The risks/benefits/side-effects/alternatives to this medication were discussed in detail with the patient and time was given for questions. The patient consents to medication trial.  - Metabolic profile and  EKG monitoring obtained while on an atypical antipsychotic  BMI: 52.86 TSH: 5.5 Lipid panel: LDL 111 (1 month ago) HbgA1c: ordered, awaiting QTc: 401 - Encouraged patient to participate in unit milieu and in scheduled group therapies  - Short Term Goals: Ability to identify changes in lifestyle to reduce recurrence of condition will improve, Ability to verbalize feelings will improve, Ability to disclose and discuss suicidal ideas, and Ability to demonstrate self-control will improve - Long Term Goals: Improvement in symptoms so as ready for discharge   Other PRNS: agitation, mild pain, sleep, GI upset   Other labs reviewed on admission: HcG quant negative. Tylenol /salicylate negative. Na 133, ETOH negative, CBC WNL, +THC               3. Medical Issues Being Addressed:    # Vitamin D  deficiency - Home vitamin D    # Hypothyroidism TSH 5.5 H - Continue home levothyroxine  112 mcg   # HTN - Restart home losartan , propranolol    - Consider addition of amlodipine if continued to be elevated, may be transient 2/2 stress    # RA - Restarted hydroxycholoroquine BID   4. Discharge Planning:    - Estimated discharge date: 2-4 days - Social work and case management to assist with discharge planning and identification of hospital follow-up needs prior to discharge. - Discharge concerns: Need to establish a safety plan; medication compliance and effectiveness. - Discharge goals: Return home with outpatient referrals  for mental health follow-up including medication management/psychotherapy.   I certify that inpatient services furnished can reasonably be expected to improve the patient's condition.     NB: This note was created using a voice recognition software as a result there may be grammatical errors inadvertently enclosed that do not reflect the nature of this encounter. Every attempt is made to correct such errors.   Odis Cleveland, MD PGY-2, Psychiatry Residency  9/18/20251:35 PM

## 2024-03-16 DIAGNOSIS — F332 Major depressive disorder, recurrent severe without psychotic features: Principal | ICD-10-CM

## 2024-03-16 DIAGNOSIS — F411 Generalized anxiety disorder: Secondary | ICD-10-CM

## 2024-03-16 DIAGNOSIS — F1290 Cannabis use, unspecified, uncomplicated: Secondary | ICD-10-CM

## 2024-03-16 NOTE — [facility] Suicide Risk Assessment (Signed)
 [facility] INPATIENT:  Family/Significant Other Suicide Prevention Education  Suicide Prevention Education:  Education Completed; Deward (father) [phone],  (name of family member/significant other) has been identified by the patient as the family member/significant other with whom the patient will be residing, and identified as the person(s) who will aid the patient in the event of a mental health crisis (suicidal ideations/suicide attempt).  With written consent from the patient, the family member/significant other has been provided the following suicide prevention education, prior to the and/or following the discharge of the patient.  Pt does not have access to weapons or firearms. Advised father to lock up medications. Father will pick pt up at discharge. No concerns with patient returning home at discharge.  The suicide prevention education provided includes the following: Suicide risk factors Suicide prevention and interventions National Suicide Hotline telephone number [facility] assessment telephone number [facility] Emergency Assistance 911 [facility] and/or Residential Mobile Crisis Unit telephone number  Request made of family/significant other to: Remove weapons (e.g., guns, rifles, knives), all items previously/currently identified as safety concern.   Remove drugs/medications (over-the-counter, prescriptions, illicit drugs), all items previously/currently identified as a safety concern.  The family member/significant other verbalizes understanding of the suicide prevention education information provided.  The family member/significant other agrees to remove the items of safety concern listed above.  Jenkins LULLA Primer 03/16/2024, 1:09 PM

## 2024-03-16 NOTE — Group Note (Signed)
 Recreation Therapy Group Note   Group Topic:Problem Solving  Group Date: 03/16/2024 Start Time: 0936 End Time: 1002 Facilitators: Ahsan Esterline-McCall, LRT,CTRS Location: [address] Dayroom   Group Topic: Communication, Team Building, Problem Solving  Goal Area(s) Addresses:  Patient will effectively work with peer towards shared goal.  Patient will identify skills used to make activity successful.  Patient will identify how skills used during activity can be used to reach post d/c goals.   Behavioral Response: Engaged  Intervention: STEM Activity  Activity: Straw Bridge. In teams of 3-5, patients were given 15 plastic drinking straws and an equal length of masking tape. Using the materials provided, patients were instructed to build a free standing bridge-like structure to suspend an everyday item (ex: puzzle box) off of the floor or table surface. All materials were required to be used by the team in their design. LRT facilitated post-activity discussion reviewing team process. Patients were encouraged to reflect how the skills used in this activity can be generalized to daily life post discharge.   Education: Pharmacist, community, Scientist, physiological, Discharge Planning   Education Outcome: Acknowledges education/In group clarification offered/Needs additional education.    Affect/Mood: Appropriate   Participation Level: Engaged   Participation Quality: Independent   Behavior: Appropriate   Speech/Thought Process: Focused   Insight: Good   Judgement: Good   Modes of Intervention: STEM Activity   Patient Response to Interventions:  Engaged   Education Outcome:  In group clarification offered    Clinical Observations/Individualized Feedback: Pt was bright and engaged during activity. Pt also offered suggestions on the structure and how to put their bridge together.      Plan: Continue to engage patient in RT group sessions 2-3x/week.   Rebekah Zackery-McCall,  LRT,CTRS 03/16/2024 12:37 PM

## 2024-03-16 NOTE — Plan of Care (Signed)
   Problem: Education: Goal: Emotional status will improve Outcome: Progressing Goal: Mental status will improve Outcome: Progressing Goal: Verbalization of understanding the information provided will improve Outcome: Progressing

## 2024-03-16 NOTE — Progress Notes (Signed)
(  Sleep Hours) -9.0 (Any PRNs that were needed, meds refused, or side effects to meds)- prn hydroxyzine  and trazodone  @ 2059 (Any disturbances and when (visitation, over night)-none (Concerns raised by the patient)- none (SI/HI/AVH)- denies all

## 2024-03-16 NOTE — Progress Notes (Signed)
   03/16/24 0800  Psych Admission Type (Psych Patients Only)  Admission Status Voluntary  Psychosocial Assessment  Patient Complaints Anxiety  Eye Contact Fair  Facial Expression Animated  Affect Anxious  Speech Logical/coherent  Interaction Assertive  Motor Activity Slow  Appearance/Hygiene Unremarkable  Behavior Characteristics Cooperative;Appropriate to situation  Mood Anxious  Thought Process  Coherency WDL  Content WDL  Delusions None reported or observed  Perception WDL  Hallucination None reported or observed  Judgment Poor  Confusion None  Danger to Self  Current suicidal ideation? Denies  Agreement Not to Harm Self Yes  Description of Agreement Verbal  Danger to Others  Danger to Others None reported or observed

## 2024-03-16 NOTE — [facility] Group Notes (Signed)
 Adult Psychoeducational Group Note  Date:  03/16/2024 Time:  10:21 AM  Group Topic/Focus:  Goals Group:   The focus of this group is to help patients establish daily goals to achieve during treatment and discuss how the patient can incorporate goal setting into their daily lives to aide in recovery.  Participation Level:  Minimal  Participation Quality:  Appropriate  Affect:  Appropriate  Cognitive:  Appropriate  Insight: Appropriate  Engagement in Group:  Engaged  Modes of Intervention:  Exploration  Additional Comments:  Pt participated in Goals group. Pt stated her goal is to make achievable/acceptable arrangements for dad pick up her dog from Ex-boyfriends house  [employer] 03/16/2024, 10:21 AM

## 2024-03-16 NOTE — Progress Notes (Signed)
 [facility] MD Progress Note  03/16/2024 1:52 PM Shannon David  MRN:  [id number]  Principal Problem: MDD (major depressive disorder), severe (HCC) Diagnosis: Principal Problem:   MDD (major depressive disorder), severe (HCC)   Reason for Admission:  Shannon David is a 33 y.o. female  with a past psychiatric history of MDD. Patient initially arrived to [facility] on 9/15 for suicidal gesture (took 8 gabapentin  and 1 abilify ), and admitted to [facility] under IVC on 9/16 for acute safety concerns and acute suicidal or self-harming behaviors. PMHx is significant for RA, hypothyroidism, IBS, PCOS.  (admitted on 03/13/2024, total  LOS: 3 days )   Overnight events: VSS. Depakote 92. Trazodone  50 mg x1. Slept 10.5 hours. Concerned about haldol /depakote change to liquid. Denies SI, HI, AVH.   On interview: Patient feels slightly better from yesterday. Denies SI. Feels good. Amenable to discharge soon. Feels as if she is comfortable increasing medication either inpatient or in outpatient setting. Endorsed some GI issues (flautus, diarrhea) secondary to new metformin  but finds that this is tolerable and does not wish to DC this medication at this time. No issues with discharge soon. Amenable to outpatient psychiatry/med management follow-up. Denies HI, AVH.   Past Psychiatric History: Current psychiatrist: None recently, only recently started medicaid Current therapist: Seeing regularly for about a year Previous psychiatric diagnoses: MDD, GAD Current psychiatric medications: Paxil  30 mg BID, gabapentin  300 mg BID Psychiatric medication history/compliance: Reports adherence, father confirms Psychiatric hospitalization(s): Adjustment disorder ISO NSSI (scratching arm with scissors) 10/24-10/29.  Psychotherapy history: Denies Neuromodulation history: Denies History of suicide (obtained from HPI): Reports no history of suicide attempts, borne out in chart review History of homicide or aggression (obtained in  HPI): None known   Substance Abuse History: Alcohol: Limited Tobacco: Denies Cannabis: Every other day IV drug use: Denies Prescription drug use: Denies Other illicit drugs: Denies Rehab history: Denies   Past Medical History: PCP: Jyothi Medical diagnoses: RA, hypothyroidism, IBS, PCOS, HTN Medications: Hydroxychloroquine , methotrexate  1/week, losartan , levothyroxine  112 mcg qday, vitamin d3, breo elliptaalbuterol, propranolol  20 mg BID, Trazodone  100 mg, gabapentin  300 mg BID Allergies: Keflex Hospitalizations: Denies Surgeries: Foot surgeries Trauma: Denies Seizures: ?PNES vs Seizure activity over the last year, neuro workup negative, did not make followup   Social History: Living situation: With dad Education: Some college Occupational history: applying for disability Marital status: Single Children: Denies Legal: Denies Military: None   Access to firearms: Patient and family denies   Family Psychiatric History: Psychiatric diagnoses: Mother bipolar; father bipolar depression Suicide history: Denies Violence/aggression: Denies Substance use history: Denies   Family Medical History:   None pertinent    Total Time spent with patient: 3 hours   Is the patient at risk to self? No.  Has the patient been a risk to self in the past 6 months? No.  Has the patient been a risk to self within the distant past? No.   Is the patient a risk to others? No.  Has the patient been a risk to others in the past 6 months? No.  Has the patient been a risk to others within the distant past? No.   Current Medications: Current Facility-Administered Medications  Medication Dose Route Frequency Provider Last Rate Last Admin   acetaminophen  (TYLENOL ) tablet 650 mg  650 mg Oral Q6H PRN Coleman, Carolyn H, NP   650 mg at 03/15/24 1207   alum & mag hydroxide-simeth (MAALOX/MYLANTA) 200-200-20 MG/5ML suspension 30 mL  30 mL Oral Q4H PRN Mardy Elveria DEL, NP  ARIPiprazole  (ABILIFY )  tablet 2 mg  2 mg Oral Daily Rollene Katz, MD   2 mg at 03/16/24 0754   cholecalciferol  (VITAMIN D3) 25 MCG (1000 UNIT) tablet 2,000 Units  2,000 Units Oral Daily Coleman, Carolyn H, NP   2,000 Units at 03/16/24 0755   haloperidol  (HALDOL ) tablet 5 mg  5 mg Oral TID PRN Mardy Elveria DEL, NP       And   diphenhydrAMINE  (BENADRYL ) capsule 50 mg  50 mg Oral TID PRN Mardy Elveria DEL, NP       haloperidol  lactate (HALDOL ) injection 5 mg  5 mg Intramuscular TID PRN Mardy Elveria DEL, NP       And   diphenhydrAMINE  (BENADRYL ) injection 50 mg  50 mg Intramuscular TID PRN Mardy Elveria DEL, NP       And   LORazepam  (ATIVAN ) injection 2 mg  2 mg Intramuscular TID PRN Mardy Elveria DEL, NP       haloperidol  lactate (HALDOL ) injection 10 mg  10 mg Intramuscular TID PRN Mardy Elveria DEL, NP       And   diphenhydrAMINE  (BENADRYL ) injection 50 mg  50 mg Intramuscular TID PRN Mardy Elveria DEL, NP       And   LORazepam  (ATIVAN ) injection 2 mg  2 mg Intramuscular TID PRN Mardy Elveria DEL, NP       fluticasone  furoate-vilanterol (BREO ELLIPTA ) 100-25 MCG/ACT 1 puff  1 puff Inhalation Daily Mardy Elveria H, NP   1 puff at 03/16/24 0754   folic acid  (FOLVITE ) tablet 2 mg  2 mg Oral Daily Coleman, Carolyn H, NP   2 mg at 03/16/24 9244   gabapentin  (NEURONTIN ) capsule 300 mg  300 mg Oral BID Rollene Katz, MD   300 mg at 03/16/24 9245   hydroxychloroquine  (PLAQUENIL ) tablet 200 mg  200 mg Oral BID Rollene Katz, MD   200 mg at 03/16/24 0755   hydrOXYzine  (ATARAX ) tablet 25 mg  25 mg Oral TID PRN Coleman, Carolyn H, NP   25 mg at 03/15/24 2059   levothyroxine  (SYNTHROID ) tablet 112 mcg  112 mcg Oral Daily Mardy Elveria DEL, NP   112 mcg at 03/16/24 9385   losartan  (COZAAR ) tablet 25 mg  25 mg Oral Daily Coleman, Carolyn H, NP   25 mg at 03/16/24 9244   magnesium  hydroxide (MILK OF MAGNESIA) suspension 30 mL  30 mL Oral Daily PRN Coleman, Carolyn H, NP       metFORMIN  (GLUCOPHAGE )  tablet 500 mg  500 mg Oral BID WC Rollene Katz, MD   500 mg at 03/16/24 9244   neomycin -bacitracin -polymyxin (NEOSPORIN) ointment   Topical PRN Rollene Katz, MD   1 Application at 03/14/24 1704   PARoxetine  (PAXIL ) tablet 60 mg  60 mg Oral Daily Coleman, Carolyn H, NP   60 mg at 03/16/24 9245   propranolol  (INDERAL ) tablet 20 mg  20 mg Oral BID Rollene Katz, MD   20 mg at 03/16/24 0754   traZODone  (DESYREL ) tablet 100 mg  100 mg Oral QHS PRN Mardy Elveria DEL, NP   100 mg at 03/15/24 2059    Lab Results:  Results for orders placed or performed during the hospital encounter of 03/13/24 (from the past 48 hours)  Hemoglobin A1c     Status: Abnormal   Collection Time: 03/15/24  6:16 AM  Result Value Ref Range   Hgb A1c MFr Bld 4.5 (L) 4.8 - 5.6 %    Comment: (NOTE) Diagnosis of Diabetes The  following HbA1c ranges recommended by the American Diabetes Association (ADA) may be used as an aid in the diagnosis of diabetes mellitus.  Hemoglobin             Suggested A1C NGSP%              Diagnosis  <5.7                   Non Diabetic  5.7-6.4                Pre-Diabetic  >6.4                   Diabetic  <7.0                   Glycemic control for                       adults with diabetes.     Mean Plasma Glucose 82.45 mg/dL    Comment: Performed at [facility], [address] [place], [place] [address]    Blood Alcohol level:  Lab Results  Component Value Date   [facility] <15 03/12/2024   ETH <10 11/11/2022    Metabolic Labs: Lab Results  Component Value Date   HGBA1C 4.5 (L) 03/15/2024   MPG 82.45 03/15/2024   No results found for: PROLACTIN Lab Results  Component Value Date   CHOL 178 01/23/2024   TRIG 91 01/23/2024   HDL 48 (L) 01/23/2024   CHOLHDL 3.7 01/23/2024   VLDL 80.1 07/21/2023   LDLCALC 111 (H) 01/23/2024   LDLCALC 95 07/21/2023    Physical Findings: AIMS: No  CIWA:    COWS:     Psychiatric Specialty Exam:  Presentation    General Appearance: Appropriate for Environment  Eye Contact: Good  Speech: Clear and Coherent  Speech Volume: Normal  Handedness: Right   Mood and Affect   Mood: Euthymic  Affect: Appropriate   Thinking   Thought Processes: Linear  Descriptions of Associations: Intact  Orientation: Full (Time, Place and Person)  Thought Content: Logical  History of Schizophrenia/Schizoaffective disorder: No   Duration of Psychotic Symptoms: N/A  Hallucinations: None  Ideas of Reference: None  Suicidal Thoughts: No With Intent; With Plan Without Intent  Homicidal Thoughts: No   Sensorium    Memory: Immediate Fair  Judgment: Fair  Insight: Fair   Therapist, art: Fair  Attention Span: Fair  Recall: Fair  Fund of Knowledge: Fair  Language: Fair   Psychomotor Activity: Normal    Assets: Desire for Improvement    Sleep: Good 9    Physical Exam ROS Blood pressure 131/88, pulse 84, temperature 98.4 F (36.9 C), temperature source Oral, resp. rate 20, height 5' 2 (1.575 m), weight 131.1 kg, last menstrual period 02/15/2024, SpO2 99%. Body mass index is 52.86 kg/m.   Treatment Plan Summary: Daily contact with patient to assess and evaluate symptoms and progress in treatment and Medication management     ASSESSMENT:    Patient is a 33 year old female who presents with worsening MDD and NSSI behavior possibly constituting a ?suicidal gesture. Endorsed recent passive SI in setting of being without her medication and with social stressors. Patient agreeable to 3-4 day voluntary stay for med management. Does not appear acutely suicidal, denied that taking these gabapentin  was an attempt on her life, and father denies having heard any suicidal thinking from patient. Shows  good insight and was able to reach out to friend after acting impulsively. Amenable to low-dose Abilify  for mood stabilization, side effects discussed. Also  amenable to starting metformin  500 mg for antipsychotic metabolic symptomatology. Patient has numerous medical comorbities and we have restarted her home medications for hypothyroidism, RA, etc. Some concern for cluster B traits but also possible WNL given very stressful situation regarding ex-husband. Worth exploring in outpatient setting. Patient may meet PTSD criteria, should also investigate this although it appears that this is not the foremost presenting symptom at this time. Was considering switching from Paxil  to another, less side-effect-heavy, SSRI but had already received this medication. Now has medicaid, will need setup with outpatient referral.   Patient is amenable to discharge home to father soon but feels as if she has seen benefit from inpatient hospitalization and medication management. This is patient's second presentation for a nearly-identical bout of suicidal gesture and self-injury within 10 months and believe outpatient treatment for this without further observation would have been inappropriate given patient's static and dynamic risk factors for suicide. She required acute intervention in order to stabilize mood and to head off further hospitalizations. Will consider further medication titration in outpatient setting, likely discharge within 24h.    Diagnoses / Active Problems: Major depressive disorder, severe, recurring, in current episode GAD Cannabis Use    PLAN:   Safety and Monitoring: -  VOLUNTARY  admission to inpatient psychiatric unit for safety, stabilization and treatment. - Daily contact with patient to assess and evaluate symptoms and progress in treatment - Patient's case to be discussed in multi-disciplinary team meeting -  Observation Level : q15 minute checks -  Vital signs:  q12 hours -  Precautions: suicide, elopement, and assault   2. Psychiatric Diagnoses and Treatment:     # Major depressive disorder, severe, recurrent, in current  episode #GAD #Cannabis use disorder - Continue abilify  2 mg qday for mood stability and to augment SSRI. - Continue Paxil   60 mg qday to simplify home regimen. - Continue home gabapentin  300 mg BID for anxiety - Continue Metformin  500 mg BID for antipsychotic-induced weight gain - Counseled cannabis cessation  - The risks/benefits/side-effects/alternatives to this medication were discussed in detail with the patient and time was given for questions. The patient consents to medication trial.  - Metabolic profile and EKG monitoring obtained while on an atypical antipsychotic  BMI: 52.86 TSH: 5.5 Lipid panel: LDL 111 (1 month ago) HbgA1c: ordered, awaiting QTc: 401 - Encouraged patient to participate in unit milieu and in scheduled group therapies  - Short Term Goals: Ability to identify changes in lifestyle to reduce recurrence of condition will improve, Ability to verbalize feelings will improve, Ability to disclose and discuss suicidal ideas, and Ability to demonstrate self-control will improve - Long Term Goals: Improvement in symptoms so as ready for discharge   Other PRNS: agitation, mild pain, sleep, GI upset   Other labs reviewed on admission: HcG quant negative. Tylenol /salicylate negative. Na 133, ETOH negative, CBC WNL, +THC               3. Medical Issues Being Addressed:    # Vitamin D  deficiency - Home vitamin D    # Hypothyroidism TSH 5.5 H - Continue home levothyroxine  112 mcg   # HTN - Restart home losartan , propranolol    - Consider addition of amlodipine if continued to be elevated, may be transient 2/2 stress    # RA - Restarted hydroxycholoroquine BID   4. Discharge  Planning:    - Estimated discharge date: 1-2 days - Social work and case management to assist with discharge planning and identification of hospital follow-up needs prior to discharge. - Discharge concerns: Need to establish a safety plan; medication compliance and effectiveness. - Discharge  goals: Return home with outpatient referrals for mental health follow-up including medication management/psychotherapy.   I certify that inpatient services furnished can reasonably be expected to improve the patient's condition.     NB: This note was created using a voice recognition software as a result there may be grammatical errors inadvertently enclosed that do not reflect the nature of this encounter. Every attempt is made to correct such errors.   Odis Cleveland, MD PGY-2, Psychiatry Residency  9/19/20251:52 PM

## 2024-03-16 NOTE — [facility] Group Notes (Signed)
 Psychoeducational Group Note  Date:  03/16/2024 Time:  2000  Group Topic/Focus:  Alcoholics Anonymous Meeting  Participation Level: Did Not Attend  Participation Quality:  Not Applicable  Affect:  Not Applicable  Cognitive:  Not Applicable  Insight:  Not Applicable  Engagement in Group: Not Applicable  Additional Comments:  Did not attend.  Lenora Manuelita RAMAN 03/16/2024, 9:58 PM

## 2024-03-17 DIAGNOSIS — F329 Major depressive disorder, single episode, unspecified: Secondary | ICD-10-CM

## 2024-03-17 MED ORDER — METFORMIN HCL 500 MG PO TABS: 500.0000 mg | ORAL_TABLET | Freq: Two times a day (BID) | ORAL | 0 refills | Status: DC

## 2024-03-17 MED ORDER — PREDNISONE 20 MG PO TABS: 20.0000 mg | ORAL_TABLET | Freq: Every day | ORAL | 0 refills | Status: DC

## 2024-03-17 MED ORDER — PROPRANOLOL HCL 20 MG PO TABS: 20.0000 mg | ORAL_TABLET | Freq: Two times a day (BID) | ORAL | 0 refills | Status: DC

## 2024-03-17 MED ORDER — TRAZODONE HCL 100 MG PO TABS: 100.0000 mg | ORAL_TABLET | Freq: Every evening | ORAL | 0 refills | Status: DC | PRN

## 2024-03-17 MED ORDER — FLUTICASONE FUROATE-VILANTEROL 100-25 MCG/ACT IN AEPB: 1.0000 | INHALATION_SPRAY | Freq: Every day | RESPIRATORY_TRACT | 3 refills | Status: AC

## 2024-03-17 MED ORDER — HYDROXYCHLOROQUINE SULFATE 200 MG PO TABS: 200.0000 mg | ORAL_TABLET | Freq: Two times a day (BID) | ORAL | 0 refills | Status: AC

## 2024-03-17 MED ORDER — ARIPIPRAZOLE 2 MG PO TABS: 2.0000 mg | ORAL_TABLET | Freq: Every day | ORAL | 0 refills | Status: DC

## 2024-03-17 MED ORDER — HYDROXYZINE HCL 25 MG PO TABS: 25.0000 mg | ORAL_TABLET | Freq: Three times a day (TID) | ORAL | 0 refills | Status: DC | PRN

## 2024-03-17 MED ORDER — GABAPENTIN 300 MG PO CAPS: 300.0000 mg | ORAL_CAPSULE | Freq: Two times a day (BID) | ORAL | 0 refills | Status: DC

## 2024-03-17 NOTE — Plan of Care (Signed)
   Problem: Education: Goal: Knowledge of [facility] Education information/materials will improve Outcome: Progressing Goal: Emotional status will improve Outcome: Progressing Goal: Mental status will improve Outcome: Progressing Goal: Verbalization of understanding the information provided will improve Outcome: Progressing

## 2024-03-17 NOTE — Plan of Care (Signed)

## 2024-03-17 NOTE — Progress Notes (Signed)
 Patient discharged from [facility] on 03/17/24 at 0920. Patient denies SI, plan, and intention. Suicide safety plan completed, reviewed with this RN, given to the patient, and a copy in the chart. Patient denies HI/AVH upon discharge. Patient rates her depression a 0/10 and her anxiety a 0/10. Patient is alert, oriented, and cooperative. RN provided patient with discharge paperwork and reviewed information with patient. Patient expressed that she understood all of the discharge instructions. Pt was satisfied with belongings returned to her from the locker and at bedside. Discharged patient to [facility] waiting room.
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
## 2024-03-17 NOTE — Progress Notes (Signed)
   03/17/24 0848  Psych Admission Type (Psych Patients Only)  Admission Status Voluntary  Psychosocial Assessment  Patient Complaints None  Eye Contact Fair  Facial Expression Animated  Affect Anxious  Speech Logical/coherent  Interaction Assertive  Motor Activity Slow  Appearance/Hygiene Unremarkable  Behavior Characteristics Cooperative;Anxious  Mood Anxious  Thought Process  Coherency WDL  Content WDL  Delusions None reported or observed  Perception WDL  Hallucination None reported or observed  Judgment Impaired  Confusion None  Danger to Self  Current suicidal ideation? Denies  Description of Suicide Plan No plan  Agreement Not to Harm Self Yes  Description of Agreement Pt verbally contracts for safety  Danger to Others  Danger to Others None reported or observed

## 2024-03-17 NOTE — Progress Notes (Signed)
  [facility] Adult Case Management Discharge Plan :  Will you be returning to the same living situation after discharge:  Yes,  the patient will be returning home At discharge, do you have transportation home?: Yes,  the patient stated that her friend will be picking her up Do you have the ability to pay for your medications: Yes,  Yes the patient stated that she has insurance  Release of information consent forms completed and in the chart;  Patient's signature needed at discharge.  Patient to Follow up at:  Follow-up Information     [facility] Follow up on 04/04/2024.   Specialty: Behavioral Health Why: You have an appointment for medication management services on 04/04/24 at 1:00 pm.  You also have an appointment for therapy services on 04/12/24 at 10:00 am. Contact information: [address] [place]  [address] [phone]        [facility], [address] [facility]. Call on 03/21/2024.   Why: You may go to this provider for group therapy services.  They are open 24/7.  Please call on 03/21/24 at 9:00 am for information. Contact information: [address] [place] [address] [phone]                 Next level of care provider has access to [facility] Link:no  Safety Planning and Suicide Prevention discussed: Yes,  CSW spoke to the paitent's father     Has patient been referred to the Quitline?: Patient refused referral for treatment  Patient has been referred for addiction treatment: Patient refused referral for treatment; referral information given to patient at discharge.  Nuno Brubacher O Daemon Dowty, LCSWA 03/17/2024, 9:34 AM

## 2024-03-17 NOTE — Progress Notes (Signed)
(  Sleep Hours) - 7.5 hours (Any PRNs that were needed, meds refused, or side effects to meds)- tylenol , atarax , trazodone  (Any disturbances and when (visitation, over night) (Concerns raised by the patient)- none (SI/HI/AVH)-denies

## 2024-03-17 NOTE — Discharge Summary (Signed)
 Physician Discharge Summary Note  Patient:  Shannon David is an 33 y.o., female MRN:  [id number] DOB:  04-02-1991 Patient phone:  [phone] (home)  Patient address:   [address] [place] [address],  Total Time spent with patient: 20 minutes  Date of Admission:  03/13/2024 Date of Discharge: 03/17/2024  Reason for Admission:    Shannon David is a 33 y.o. female  with a past psychiatric history of MDD. Patient initially arrived to [facility] on 9/15 for suicidal gesture (took 8 gabapentin  and 1 abilify ), and admitted to [facility] under IVC on 9/16 for acute safety concerns and acute suicidal or self-harming behaviors. PMHx is significant for RA, hypothyroidism, IBS, PCOS.    HPI:    On interview, patient is linear, logical and goal directed. She says that she is here because she impulsively took four pills of her gabapentin  300 mg, engaged in superficial cutting to relieve pressure, and then took another four pills of gabapentin  and one pill of another person's Abilify . She said that she took these medications for sleep and that this did not constitute a suicide attempt. She told a friend shortly afterwards and then presented voluntarily to the behavioral health urgent care 9/15. She has numerous ongoing life stressors including domestic disputes with her ex-husband regarding apportionment of proceeds on selling the house, who has possession of their dog, etc. Also had not had access to her Paxil  for 3-4 days (ran out of 90 day script provided by PCP). Has been showing signs of depression for some weeks, which worsened over that time (poor sleep, anhedonia, guilt/worthlessness, low energy and loss of appetite). She presented to [facility] last November for similar concerns (NSSI) but does not endorse previous history of suicide attempt. Denies suicidal thinking today, but says that recently she believed that she would not have minded if she were hit by a car. Does not feel this way now. Says that she  did not voice active suicidal ideation to previous providers (which is borne out in chart review). Is future-oriented: would like to get back on her medications and have some time away from the stress of her life. Amenable to voluntary 3-4 day inpatient stay for this purpose. Endorsed cannabis use every other day for anxiolytic effect, recommended cessation and gave reasoning.    Patient said that she was currently living with dad and gave unrestricted permission to reach out to him to discuss her care.   Collateral information via father, Deward [phone]): No firearms. No safety concerns. Doing real good until she ran out of her medications. Could tell on day 3-4 that she wasn't herself.   Principal Problem: MDD (major depressive disorder), severe (HCC) Discharge Diagnoses: Principal Problem:   MDD (major depressive disorder), severe (HCC)   Past Psychiatric History:  Current psychiatrist: None recently, only recently started medicaid Current therapist: Seeing regularly for about a year Previous psychiatric diagnoses: MDD, GAD Current psychiatric medications: Paxil  30 mg BID, gabapentin  300 mg BID Psychiatric medication history/compliance: Reports adherence, father confirms Psychiatric hospitalization(s): Adjustment disorder ISO NSSI (scratching arm with scissors) 10/24-10/29.  Psychotherapy history: Denies Neuromodulation history: Denies History of suicide (obtained from HPI): Reports no history of suicide attempts, borne out in chart review History of homicide or aggression (obtained in HPI): None known   Substance Abuse History: Alcohol: Limited Tobacco: Denies Cannabis: Every other day IV drug use: Denies Prescription drug use: Denies Other illicit drugs: Denies Rehab history: Denies  Past Medical History:  Past Medical History:  Diagnosis Date  Anxiety    COVID    History of Clostridium difficile infection 01/2023   History of hypothyroidism    History of PCOS     Major depressive disorder, recurrent severe without psychotic features (HCC)    MDD (major depressive disorder), recurrent episode, moderate (HCC) 04/21/2023   Pt presented for self harm by cutting wrist at work     Passive suicidal ideations 11/11/2022   Rheumatoid arthritis (HCC)    Severe recurrent major depression without psychotic features (HCC) 05/06/2023   Suicidal behavior with attempted self-injury (HCC) 04/22/2023    Past Surgical History:  Procedure Laterality Date   BUNIONECTOMY Right    FOOT SURGERY Right 08/24/2021   Family History:  Family History  Problem Relation Age of Onset   Depression Mother    Bipolar disorder Mother    Hypertension Mother    Clotting disorder Mother    Depression Brother    Rheum arthritis Brother    Lung cancer Paternal Uncle    Cancer Maternal Grandmother        lung   Family Psychiatric  History:  Psychiatric diagnoses: Mother bipolar; father bipolar depression Suicide history: Denies Violence/aggression: Denies Substance use history: Denies  Social History:  Social History   Substance and Sexual Activity  Alcohol Use No     Social History   Substance and Sexual Activity  Drug Use No    Social History   Socioeconomic History   Marital status: Legally Separated    Spouse name: Not on file   Number of children: 0   Years of education: Not on file   Highest education level: High school graduate  Occupational History   Not on file  Tobacco Use   Smoking status: Never    Passive exposure: Current   Smokeless tobacco: Never  Vaping Use   Vaping status: Never Used  Substance and Sexual Activity   Alcohol use: No   Drug use: No   Sexual activity: Not on file  Other Topics Concern   Not on file  Social History Narrative   Left Handed    Lives in a one story home   Drinks Caffeine    Social Drivers of Health   Financial Resource Strain: Not on file  Food Insecurity: No Food Insecurity (03/13/2024)   Hunger Vital  Sign    Worried About Running Out of Food in the Last Year: Never true    Ran Out of Food in the Last Year: Never true  Transportation Needs: No Transportation Needs (03/13/2024)   PRAPARE - Administrator, Civil Service (Medical): No    Lack of Transportation (Non-Medical): No  Physical Activity: Not on file  Stress: Not on file  Social Connections: Not on file    Hospital Course:   - Start abilify  2 mg qday for mood stability and to augment SSRI. - Change Paxil  30 mg BID --> 60 mg qday to simplify home regimen. - Restart home gabapentin  300 mg BID for anxiety - Start Metformin  500 mg BID for antipsychotic-induced weight gain   During the hospitalization, other adjustments were made to the patient's psychiatric medication regimen: None   Patient's care was discussed during the interdisciplinary team meeting every day during the hospitalization.   The patient denied having side effects to prescribed psychiatric medication.   Gradually, patient started adjusting to milieu. The patient was evaluated each day by a clinical provider to ascertain response to treatment. Improvement was noted by the patient's report of  decreasing symptoms, improved sleep and appetite, affect, medication tolerance, behavior, and participation in unit programming.  Patient was asked each day to complete a self inventory noting mood, mental status, pain, new symptoms, anxiety and concerns.     Symptoms were reported as significantly decreased or resolved completely by discharge.    On day of discharge, the patient reports that their mood is stable. The patient denied having suicidal thoughts for more than 48 hours prior to discharge.  Patient denies having homicidal thoughts.  Patient denies having auditory hallucinations.  Patient denies any visual hallucinations or other symptoms of psychosis. The patient was motivated to continue taking medication with a goal of continued improvement in mental health.     The patient reports their target psychiatric symptoms of depression and anxiety responded well to the psychiatric medications, and the patient reports overall benefit other psychiatric hospitalization. Supportive psychotherapy was provided to the patient. The patient also participated in regular group therapy while hospitalized. Coping skills, problem solving as well as relaxation therapies were also part of the unit programming.   Labs were reviewed with the patient, and abnormal results were discussed with the patient.   The patient is able to verbalize their individual safety plan to this provider.   # It is recommended to the patient to continue psychiatric medications as prescribed, after discharge from the hospital.     # It is recommended to the patient to follow up with your outpatient psychiatric provider and PCP.   # It was discussed with the patient, the impact of alcohol, drugs, tobacco have been there overall psychiatric and medical wellbeing, and total abstinence from substance use was recommended the patient.ed.   # Prescriptions provided or sent directly to preferred pharmacy at discharge. Patient agreeable to plan. Given opportunity to ask questions. Appears to feel comfortable with discharge.    # In the event of worsening symptoms, the patient is instructed to call the crisis hotline, 911 and or go to the nearest ED for appropriate evaluation and treatment of symptoms. To follow-up with primary care provider for other medical issues, concerns and or health care needs   # Patient was discharged home with a plan to follow up as noted below.    Physical Findings: AIMS:  , ,  ,  ,  ,  ,   CIWA:    COWS:     Musculoskeletal: Strength & Muscle Tone: within normal limits Gait & Station: normal Patient leans: N/A   Psychiatric Specialty Exam:  Presentation  General Appearance:  Appropriate for Environment; Casual  Eye Contact: Good  Speech: Clear and  Coherent  Speech Volume: Normal  Handedness: Right   Mood and Affect  Mood: Euthymic  Affect: Appropriate; Congruent   Thought Process  Thought Processes: Coherent  Descriptions of Associations:Intact  Orientation:Full (Time, Place and Person)  Thought Content:Logical  History of Schizophrenia/Schizoaffective disorder:No  Duration of Psychotic Symptoms:No data recorded Hallucinations:Hallucinations: None  Ideas of Reference:None  Suicidal Thoughts:Suicidal Thoughts: No  Homicidal Thoughts:Homicidal Thoughts: No   Sensorium  Memory: Immediate Good; Recent Good  Judgment: Good  Insight: Good   Executive Functions  Concentration: Good  Attention Span: Good  Recall: Good  Fund of Knowledge: Good  Language: Good   Psychomotor Activity  Psychomotor Activity: Psychomotor Activity: Normal   Assets  Assets: Communication Skills; Desire for Improvement; Social Support; Housing   Sleep  Sleep: Sleep: Good  Estimated Sleeping Duration (Last 24 Hours): 6.50-7.50 hours   Physical Exam: Physical Exam  Pulmonary:     Effort: Pulmonary effort is normal.  Musculoskeletal:        General: Normal range of motion.  Neurological:     Mental Status: She is alert.     Review of Systems  Constitutional:  Negative for chills and fever.  Respiratory:  Negative for cough.   Gastrointestinal:  Negative for nausea and vomiting.  Psychiatric/Behavioral:  Negative for depression, hallucinations and suicidal ideas. The patient is not nervous/anxious.  Blood pressure 129/69, pulse 80, temperature 98 F (36.7 C), temperature source Oral, resp. rate 18, height 5' 2 (1.575 m), weight 131.1 kg, last menstrual period 02/15/2024, SpO2 99%. Body mass index is 52.86 kg/m.   Social History   Tobacco Use  Smoking Status Never   Passive exposure: Current  Smokeless Tobacco Never   Tobacco Cessation:  N/A, patient does not currently use tobacco  products   Blood Alcohol level:  Lab Results  Component Value Date   [facility] <15 03/12/2024   ETH <10 11/11/2022    Metabolic Disorder Labs:  Lab Results  Component Value Date   HGBA1C 4.5 (L) 03/15/2024   MPG 82.45 03/15/2024   No results found for: PROLACTIN Lab Results  Component Value Date   CHOL 178 01/23/2024   TRIG 91 01/23/2024   HDL 48 (L) 01/23/2024   CHOLHDL 3.7 01/23/2024   VLDL 80.1 07/21/2023   LDLCALC 111 (H) 01/23/2024   LDLCALC 95 07/21/2023    See Psychiatric Specialty Exam and Suicide Risk Assessment completed by Attending Physician prior to discharge.  Discharge destination:  Home  Is patient on multiple antipsychotic therapies at discharge:  No   Has Patient had three or more failed trials of antipsychotic monotherapy by history:  No  Recommended Plan for Multiple Antipsychotic Therapies: NA  Discharge Instructions     Diet - low sodium heart healthy   Complete by: As directed    Increase activity slowly   Complete by: As directed       Allergies as of 03/17/2024       Reactions   Cephalexin Hives   Keflex        Medication List     STOP taking these medications    hydrOXYzine  25 MG capsule Commonly known as: VISTARIL        TAKE these medications      Indication  Actemra  80 MG/4ML Soln injection Generic drug: Tocilizumab  INFUSE 4MG /KG INTRAVENOUSLY EVERY 4 WEEKS (Body weight 123.9kg)  Indication: Rheumatoid Arthritis   albuterol  108 (90 Base) MCG/ACT inhaler Commonly known as: VENTOLIN  HFA Inhale 1-2 puffs into the lungs every 6 (six) hours as needed for wheezing or shortness of breath.  Indication: Asthma   ARIPiprazole  2 MG tablet Commonly known as: ABILIFY  Take 1 tablet (2 mg total) by mouth daily. Start taking on: March 18, 2024  Indication: Major Depressive Disorder   azithromycin  250 MG tablet Commonly known as: Zithromax  Z-Pak Take 2 tablets day 1 and then 1 daily for 4 days  Indication: Bronchitis    fluticasone  furoate-vilanterol 100-25 MCG/ACT Aepb Commonly known as: Breo Ellipta  Inhale 1 puff into the lungs daily.  Indication: Reactive Airway Disease   folic acid  1 MG tablet Commonly known as: FOLVITE  Take 2 mg by mouth daily.  Indication: Anemia From Inadequate Folic Acid    gabapentin  300 MG capsule Commonly known as: NEURONTIN  Take 1 capsule (300 mg total) by mouth 2 (two) times daily. What changed: when to take this  Indication: Generalized Anxiety Disorder  hydroxychloroquine  200 MG tablet Commonly known as: PLAQUENIL  Take 1 tablet (200 mg total) by mouth 2 (two) times daily. What changed:  how much to take how to take this when to take this additional instructions  Indication: Rheumatoid Arthritis   hydrOXYzine  25 MG tablet Commonly known as: ATARAX  Take 1 tablet (25 mg total) by mouth 3 (three) times daily as needed for anxiety.  Indication: Feeling Anxious   levothyroxine  112 MCG tablet Commonly known as: SYNTHROID  Take 1 tablet (112 mcg total) by mouth daily.  Indication: Underactive Thyroid    losartan  25 MG tablet Commonly known as: COZAAR  Take 25 mg by mouth daily.  Indication: High Blood Pressure   metFORMIN  500 MG tablet Commonly known as: GLUCOPHAGE  Take 1 tablet (500 mg total) by mouth 2 (two) times daily with a meal.  Indication: Body Weight Gain due to Antipsychotic Medication Use   methotrexate  2.5 MG tablet Commonly known as: RHEUMATREX TAKE 8 TABLETS (20 MG TOTAL) BY MOUTH ONCE A WEEK. CAUTION:CHEMOTHERAPY. PROTECT FROM LIGHT.  Indication: Rheumatoid Arthritis   ondansetron  4 MG tablet Commonly known as: ZOFRAN  Take 4 mg by mouth every 8 (eight) hours as needed.  Indication: Nausea and Vomiting   PARoxetine  30 MG tablet Commonly known as: PAXIL  Take 2 tablets (60 mg total) by mouth daily.  Indication: Major Depressive Disorder   predniSONE  20 MG tablet Commonly known as: DELTASONE  Take 1 tablet (20 mg total) by mouth daily  with breakfast.  Indication: Bronchitis   propranolol  20 MG tablet Commonly known as: INDERAL  Take 1 tablet (20 mg total) by mouth 2 (two) times daily.  Indication: Feeling Anxious   traZODone  100 MG tablet Commonly known as: DESYREL  Take 1 tablet (100 mg total) by mouth at bedtime as needed for sleep. What changed:  when to take this reasons to take this  Indication: Trouble Sleeping   Vitamin D3 50 MCG (2000 UT) capsule Take 2,000 Units by mouth daily.  Indication: Osteoporosis        Follow-up Information     [facility] Follow up on 04/04/2024.   Specialty: Behavioral Health Why: You have an appointment for medication management services on 04/04/24 at 1:00 pm.  You also have an appointment for therapy services on 04/12/24 at 10:00 am. Contact information: [address] [place]  [address] [phone]        Inc, [address] [facility]. Call on 03/21/2024.   Why: You may go to this provider for group therapy services.  They are open 24/7.  Please call on 03/21/24 at 9:00 am for information. Contact information: [address] [place] [address] [phone]                 Follow-up recommendations:   Activity: as tolerated   Diet: heart healthy   Other: -Follow-up with your outpatient psychiatric provider -instructions on appointment date, time, and address (location) are provided to you in discharge paperwork.   -Take your psychiatric medications as prescribed at discharge - instructions are provided to you in the discharge paperwork   -Follow-up with outpatient primary care doctor and other specialists -for management of preventative medicine and chronic medical disease   -Testing: Follow-up with outpatient provider for abnormal lab results: -- TSH 5.563 -- Hcg POSITIVE   -If you are prescribed an atypical antipsychotic medication, we recommend that your outpatient psychiatrist  follow routine screening for side effects within 3 months of discharge, including monitoring: AIMS scale, height, weight,  blood pressure, fasting lipid panel, HbA1c, and fasting blood sugar.    -Recommend total abstinence from alcohol, tobacco, and other illicit drug use at discharge.    -If your psychiatric symptoms recur, worsen, or if you have side effects to your psychiatric medications, call your outpatient psychiatric provider, 911, [phone] or go to the nearest emergency department.   -If suicidal thoughts occur, immediately call your outpatient psychiatric provider, 911, [phone] or go to the nearest emergency department.  Signed: PATTI OLDEN, MD 03/17/2024, 9:16 AM

## 2024-03-17 NOTE — Discharge Instructions (Addendum)

## 2024-03-19 ENCOUNTER — Ambulatory Visit: Admitting: Pulmonary Disease

## 2024-03-19 ENCOUNTER — Telehealth (HOSPITAL_COMMUNITY): Payer: Self-pay

## 2024-03-19 ENCOUNTER — Ambulatory Visit

## 2024-03-19 DIAGNOSIS — R0602 Shortness of breath: Secondary | ICD-10-CM | POA: Diagnosis not present

## 2024-03-19 LAB — PULMONARY FUNCTION TEST
DL/VA % pred: 127 %
DL/VA: 5.81 ml/min/mmHg/L
DLCO cor % pred: 104 %
DLCO cor: 22.38 ml/min/mmHg
DLCO unc % pred: 107 %
DLCO unc: 23.17 ml/min/mmHg
FEF 25-75 Post: 3.33 L/s
FEF 25-75 Pre: 3.21 L/s
FEF2575-%Change-Post: 3 %
FEF2575-%Pred-Post: 102 %
FEF2575-%Pred-Pre: 98 %
FEV1-%Change-Post: 3 %
FEV1-%Pred-Post: 86 %
FEV1-%Pred-Pre: 83 %
FEV1-Post: 2.61 L
FEV1-Pre: 2.52 L
FEV1FVC-%Change-Post: 0 %
FEV1FVC-%Pred-Pre: 104 %
FEV6-%Change-Post: 4 %
FEV6-%Pred-Post: 84 %
FEV6-%Pred-Pre: 80 %
FEV6-Post: 3.01 L
FEV6-Pre: 2.88 L
FEV6FVC-%Pred-Post: 101 %
FEV6FVC-%Pred-Pre: 101 %
FVC-%Change-Post: 4 %
FVC-%Pred-Post: 83 %
FVC-%Pred-Pre: 79 %
FVC-Post: 3.01 L
FVC-Pre: 2.88 L
Post FEV1/FVC ratio: 87 %
Post FEV6/FVC ratio: 100 %
Pre FEV1/FVC ratio: 87 %
Pre FEV6/FVC Ratio: 100 %
RV % pred: 66 %
RV: 0.95 L
TLC % pred: 82 %
TLC: 4.06 L

## 2024-03-19 NOTE — Telephone Encounter (Signed)
 received fax that a prior auth is needed for the fluticason furoate-vilanterol 100-25 MCG/ACT [employer].  [email or web address] key # [id number] pt last name Poch DOB Jan 31, 1991

## 2024-03-19 NOTE — Patient Instructions (Signed)
 Full PFT performed today.

## 2024-03-19 NOTE — Progress Notes (Signed)
 Full PFT performed today.

## 2024-03-19 NOTE — Telephone Encounter (Signed)
 received fax that a prior auth is needed for the aripiprazole  2mg . [email or web address] key # [id number] pt last name Chestang DOB September 08, 1990

## 2024-03-23 ENCOUNTER — Telehealth (HOSPITAL_BASED_OUTPATIENT_CLINIC_OR_DEPARTMENT_OTHER): Payer: Self-pay

## 2024-03-23 NOTE — Telephone Encounter (Signed)
 Copied from CRM #[id number]. Topic: Clinical - Lab/Test Results >> Mar 21, 2024  4:25 PM Leila BROCKS wrote: Reason for CRM: Patient [phone] want PFT test result, patient can see it on Mychart. Please advise and communicate with a call back or Mychart. >> Mar 22, 2024 10:05 AM Corean SAUNDERS wrote: Patient is kindly requesting a call back regarding her PFT results as the note in MyChart has some concerning messages.

## 2024-03-23 NOTE — Telephone Encounter (Signed)
 Pharmacy notified.

## 2024-03-25 NOTE — Telephone Encounter (Signed)
 Breathing study is essentially within normal limits, increased diffusing capacity can be seen in pulmonary congestion from any cause, can also be seen in some asthmatics.  No changes to be made based on the breathing study
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
## 2024-03-26 NOTE — Telephone Encounter (Signed)
Called and spoke with patient, advised of results/recommendations per Dr. Olalere.  She verbalized understanding.  Nothing further needed. 

## 2024-03-27 ENCOUNTER — Ambulatory Visit: Payer: Self-pay | Admitting: Pulmonary Disease

## 2024-03-28 ENCOUNTER — Ambulatory Visit (INDEPENDENT_AMBULATORY_CARE_PROVIDER_SITE_OTHER)

## 2024-03-28 ENCOUNTER — Other Ambulatory Visit: Payer: Self-pay

## 2024-03-28 VITALS — BP 114/76 | HR 74 | Temp 98.1°F | Resp 12 | Ht 63.0 in | Wt 292.6 lb

## 2024-03-28 DIAGNOSIS — M0579 Rheumatoid arthritis with rheumatoid factor of multiple sites without organ or systems involvement: Secondary | ICD-10-CM | POA: Diagnosis not present

## 2024-03-28 DIAGNOSIS — Z79899 Other long term (current) drug therapy: Secondary | ICD-10-CM

## 2024-03-28 MED ORDER — DIPHENHYDRAMINE HCL 25 MG PO CAPS
25.0000 mg | ORAL_CAPSULE | Freq: Once | ORAL | Status: AC
  Administered 2024-03-28: 25 mg via ORAL
  Filled 2024-03-28: qty 1

## 2024-03-28 MED ORDER — ACETAMINOPHEN 325 MG PO TABS
650.0000 mg | ORAL_TABLET | Freq: Once | ORAL | Status: AC
  Administered 2024-03-28: 650 mg via ORAL
  Filled 2024-03-28: qty 2

## 2024-03-28 MED ORDER — TOCILIZUMAB 400 MG/20ML IV SOLN
4.0000 mg/kg | Freq: Once | INTRAVENOUS | Status: AC
  Administered 2024-03-28: 530 mg via INTRAVENOUS
  Filled 2024-03-28: qty 18.5

## 2024-03-28 NOTE — Progress Notes (Signed)
 Diagnosis: Rheumatoid Arthritis  Provider:  Lonna Coder MD  Procedure: IV Infusion  IV Type: Peripheral, IV Location: L Antecubital  Actemra  (Tocilizumab ), Dose: 530 mg  Infusion Start Time: 1440  Infusion Stop Time: 1544  Post Infusion IV Care: PIV discontinued  Discharge: Condition: Stable, Destination: Home . AVS Declined  Performed by:  Maximiano JONELLE Pouch, LPN

## 2024-03-28 NOTE — Telephone Encounter (Signed)
**Note De-identified  [place] Obfuscation** Please advise 

## 2024-03-29 ENCOUNTER — Ambulatory Visit: Payer: Self-pay | Admitting: Physician Assistant

## 2024-03-29 LAB — CBC WITH DIFFERENTIAL/PLATELET
Absolute Lymphocytes: 1276 {cells}/uL (ref 850–3900)
Absolute Monocytes: 722 {cells}/uL (ref 200–950)
Basophils Absolute: 26 {cells}/uL (ref 0–200)
Basophils Relative: 0.3 %
Eosinophils Absolute: 0 {cells}/uL — ABNORMAL LOW (ref 15–500)
Eosinophils Relative: 0 %
HCT: 40.1 % (ref 35.0–45.0)
Hemoglobin: 13.3 g/dL (ref 11.7–15.5)
MCH: 31.5 pg (ref 27.0–33.0)
MCHC: 33.2 g/dL (ref 32.0–36.0)
MCV: 95 fL (ref 80.0–100.0)
MPV: 10.9 fL (ref 7.5–12.5)
Monocytes Relative: 8.2 %
Neutro Abs: 6776 {cells}/uL (ref 1500–7800)
Neutrophils Relative %: 77 %
Platelets: 344 Thousand/uL (ref 140–400)
RBC: 4.22 Million/uL (ref 3.80–5.10)
RDW: 11.7 % (ref 11.0–15.0)
Total Lymphocyte: 14.5 %
WBC: 8.8 Thousand/uL (ref 3.8–10.8)

## 2024-03-29 NOTE — Progress Notes (Signed)
 Absolute eosinophils are low but rest of CBC with diff WNL

## 2024-04-04 ENCOUNTER — Ambulatory Visit (HOSPITAL_BASED_OUTPATIENT_CLINIC_OR_DEPARTMENT_OTHER): Admitting: Psychiatry

## 2024-04-04 VITALS — BP 130/78 | Ht 63.0 in | Wt 295.6 lb

## 2024-04-04 DIAGNOSIS — T50905A Adverse effect of unspecified drugs, medicaments and biological substances, initial encounter: Secondary | ICD-10-CM

## 2024-04-04 DIAGNOSIS — F322 Major depressive disorder, single episode, severe without psychotic features: Secondary | ICD-10-CM | POA: Diagnosis not present

## 2024-04-04 DIAGNOSIS — G4700 Insomnia, unspecified: Secondary | ICD-10-CM | POA: Diagnosis not present

## 2024-04-04 DIAGNOSIS — F411 Generalized anxiety disorder: Secondary | ICD-10-CM

## 2024-04-04 DIAGNOSIS — R635 Abnormal weight gain: Secondary | ICD-10-CM | POA: Diagnosis not present

## 2024-04-04 MED ORDER — TRAZODONE HCL 100 MG PO TABS: 100.0000 mg | ORAL_TABLET | Freq: Every evening | ORAL | 1 refills | Status: DC | PRN

## 2024-04-04 MED ORDER — HYDROXYZINE HCL 25 MG PO TABS
25.0000 mg | ORAL_TABLET | Freq: Three times a day (TID) | ORAL | 0 refills | Status: DC | PRN
Start: 2024-04-04 — End: 2024-05-21

## 2024-04-04 MED ORDER — METFORMIN HCL 500 MG PO TABS: 500.0000 mg | ORAL_TABLET | Freq: Two times a day (BID) | ORAL | 1 refills | Status: DC

## 2024-04-04 MED ORDER — PAROXETINE HCL ER 25 MG PO TB24: 25.0000 mg | ORAL_TABLET | Freq: Every day | ORAL | 1 refills | Status: DC

## 2024-04-04 MED ORDER — PROPRANOLOL HCL 20 MG PO TABS
20.0000 mg | ORAL_TABLET | Freq: Two times a day (BID) | ORAL | 0 refills | Status: DC | PRN
Start: 2024-04-04 — End: 2024-05-21

## 2024-04-04 MED ORDER — GABAPENTIN 300 MG PO CAPS: 300.0000 mg | ORAL_CAPSULE | Freq: Two times a day (BID) | ORAL | 1 refills | Status: DC

## 2024-04-04 MED ORDER — ARIPIPRAZOLE 2 MG PO TABS: 2.0000 mg | ORAL_TABLET | Freq: Every day | ORAL | 1 refills | Status: DC

## 2024-04-04 MED ORDER — PAROXETINE HCL 30 MG PO TABS: 30.0000 mg | ORAL_TABLET | Freq: Every day | ORAL | 1 refills | Status: DC

## 2024-04-04 MED ORDER — PAROXETINE HCL 30 MG PO TABS: 60.0000 mg | ORAL_TABLET | Freq: Every day | ORAL | 1 refills | Status: DC

## 2024-04-04 NOTE — Addendum Note (Signed)
 Addended by: IZELLA CARAWAY B on: 04/04/2024 03:01 PM   Modules accepted: Orders

## 2024-04-06 ENCOUNTER — Other Ambulatory Visit: Payer: Self-pay | Admitting: Medical Genetics

## 2024-04-06 DIAGNOSIS — Z006 Encounter for examination for normal comparison and control in clinical research program: Secondary | ICD-10-CM

## 2024-04-12 ENCOUNTER — Ambulatory Visit (INDEPENDENT_AMBULATORY_CARE_PROVIDER_SITE_OTHER): Admitting: Clinical

## 2024-04-12 DIAGNOSIS — F6089 Other specific personality disorders: Secondary | ICD-10-CM | POA: Diagnosis not present

## 2024-04-12 DIAGNOSIS — F322 Major depressive disorder, single episode, severe without psychotic features: Secondary | ICD-10-CM | POA: Diagnosis not present

## 2024-04-12 DIAGNOSIS — F411 Generalized anxiety disorder: Secondary | ICD-10-CM | POA: Diagnosis not present

## 2024-04-12 NOTE — Progress Notes (Signed)
 THERAPIST PROGRESS NOTE  Session Time: 10:05am-11:00am  Session #11  Participation Level: Active  Behavioral Response: Casual Alert Depressed   Type of Therapy: Individual Therapy  Treatment Goals addressed:  LTG: Reduce frequency, intensity, and duration of depression symptoms so that daily functioning is improved  STG: Shannon David will practice problem solving skills 3 times per week for the next 4 weeks.  STG: Shannon David will reduce frequency of avoidant behaviors by 50% as evidenced by self-report in therapy sessions LTG: Learn and practice communication techniques such as active listening, I statements, open-ended questions, reflective listening, assertiveness, fair fighting rules, initiating conversations, and more as necessary and taught in session.      STG: Learn emotion regulation strategies, distress tolerance skills, interpersonal effectiveness techniques, and mindfulness practices and use them in session and in life situations to improve results and satisfaction.  LTG: Process life events to the extent needed so that will be able to move forward with various areas of life in a better frame of mind per self-report.   STG: Work on forgiveness, shame, sleep, relationship to food, or other issues as appropriate and as these present during sessions.    STG: Work to Arts development officer from models like CBT, Stages of Change, DBT, shame resilience theory, ACT, SFBT, MI, trauma-informed therapy and others to be able to manage mental health symptoms, AEB practicing out of session and reporting back.   LTG: Learn about boundary types, how to implement them, and how to enforce them so that feels more empowered and content with being able to maintain more helpful, appropriate boundaries in the future for a more balanced result.    STG: Learn breathing techniques and grounding techniques at an age-appropriate and ability-appropriate level and demonstrate mastery in session then report independent  use of these skills out of session.   LTG: Shannon David will score less than 5 on the Generalized Anxiety Disorder 7 Scale (GAD-7) and less than 9 on the Patient Health Questionnaire (PHQ-9).   ProgressTowards Goals: Progressing  Interventions: CBT and Supportive   Summary: Shannon David is a 33 y.o. female who presents with Major Depressive Disorder that has worsened recently. She presented oriented x5 and stated she was feeling just there, stuck, crying a lot.  CSW evaluated patient's medication compliance, use of coping tools, and self-care, as applicable.  She provided an update on various aspects of her life that are normally discussed in therapy, including recent hospitalization following a suicide attempt, disability application, running out of money from ex-husband, efforts to not stay stuck inside, and smoking marijuana.  She is not sleeping well, waking up at 2-3 am and not being able to go back to sleep.  This results in daytime naps, which we discussed as detrimental to the nighttime sleep.  She shared that her monthly infusion doses for her rheumatoid arthritis are being increased and as she looks at her hands deteriorating, she believes they are going to be deformed like her aunt's were.  She is trying not to stay inside all the time, is bored due to running out of canvasses to paint.  She is excited to show some of her paintings to CSW.  The money she received from her ex has run out and she does not like to ask anyone for anything, so she will not ask her father for new canvasses.  Her father has been helpful by stepping in and stopping the contact with her ex-husband so that is no longer hurting her.  She stated they  do not really talk, however, and although she wants to be close to him the only thing they actually do together is to smoke marijuana.  She has the ability to talk to her mother, but with her mother's mental health issues comes a labile temperament.  Mom went off on patient last  week which was hurtful, although she forgives and puts it behind her as soon as possible in order to be able to talk with mom again.  She reported that her doctor seems more concerned about her weight than she herself is.  Overeaters Anonymous was mentioned as possibility but she did not indicate any interest, so this was not pursued further.  She reported that she does have occasional thoughts of NSSIB, but thoughts of her dog stop her from following through.  We talked about how our behaviors can change our feelings in a manner just like our thoughts can change our feelings.  CSW went over a 4-page list of Pleasant Activities with her, then gave it to her to take home and review.  She was given homework regarding this.  The activities she does currently do to fight her depression are to open the blinds in the house daily for sunshine and take walks with a friend, as her arthritis allows her.  Suicidal/Homicidal: No without intent/plan  Therapist Response: Patient is progressing AEB engaging in scheduled therapy session.  Throughout the session, CSW gave patient the opportunity to explore thoughts and feelings associated with current life situations and past/present stressors.   CSW challenged patient gently and appropriately to consider different ways of looking at reported issues. CSW encouraged patient's expression of feelings and validated these using empathy, active listening, open body language, and unconditional positive regard.        Plan/Recommendations:  Return to therapy in 3 weeks to next scheduled appointment on 11/6, reflect on what was discussed in session, engage in self care behaviors as explored in session, do homework as assigned (look over Pleasant Activities list and choose 1-2 to do weekly), and return to next session prepared to talk about experience with new coping methods.   Diagnosis:  MDD (major depressive disorder), severe (HCC)  GAD (generalized anxiety disorder)  Cluster  B personality disorder (HCC)  Collaboration of Care: Psychiatrist AEB - psychiatric provider can see therapy notes as needed, therapist can read psychiatric notes  Patient/Guardian was advised Release of Information must be obtained prior to any record release in order to collaborate their care with an outside provider. Patient/Guardian was advised if they have not already done so to contact the registration department to sign all necessary forms in order for us  to release information regarding their care.   Consent: Patient/Guardian gives verbal consent for treatment and assignment of benefits for services provided during this visit. Patient/Guardian expressed understanding and agreed to proceed.   Elgie JINNY Crest, LCSW 04/15/2024

## 2024-04-15 ENCOUNTER — Encounter (HOSPITAL_COMMUNITY): Payer: Self-pay | Admitting: Clinical

## 2024-04-16 ENCOUNTER — Telehealth (HOSPITAL_COMMUNITY): Payer: Self-pay

## 2024-04-16 NOTE — Telephone Encounter (Signed)
 Patient is calling for a letter for an emotional support animal. Patient states that ex husband took the dog and the only way she can get it back is with the letter. Please review and advise, thank you

## 2024-04-19 ENCOUNTER — Telehealth: Payer: Self-pay | Admitting: Pharmacy Technician

## 2024-04-19 NOTE — Telephone Encounter (Signed)
 Auth Submission: NO AUTH NEEDED Site of care: Site of care: [facility] (patient has been transferred from [address] [facility] to [facility]) due to 340b  Pricing Last dose at [address] will 04/25/24 Payer: [employer] Medication & CPT/J Code(s) submitted: Actemra  (Tocilizumab ) G6737) Diagnosis Code:  Route of submission (phone, fax, portal):  Phone # Fax # Auth type: Buy/Bill HB Units/visits requested: 4mg /kg Reference number:  Approval from: 04/19/24 to 06/27/24

## 2024-04-25 ENCOUNTER — Other Ambulatory Visit: Payer: Self-pay | Admitting: Pharmacist

## 2024-04-25 ENCOUNTER — Ambulatory Visit

## 2024-04-25 VITALS — BP 111/69 | HR 62 | Temp 97.7°F | Resp 14 | Ht 62.0 in | Wt 299.2 lb

## 2024-04-25 DIAGNOSIS — M0579 Rheumatoid arthritis with rheumatoid factor of multiple sites without organ or systems involvement: Secondary | ICD-10-CM

## 2024-04-25 DIAGNOSIS — Z111 Encounter for screening for respiratory tuberculosis: Secondary | ICD-10-CM

## 2024-04-25 MED ORDER — TOCILIZUMAB 400 MG/20ML IV SOLN
4.0000 mg/kg | Freq: Once | INTRAVENOUS | Status: AC
  Administered 2024-04-25: 542 mg via INTRAVENOUS
  Filled 2024-04-25: qty 20

## 2024-04-25 MED ORDER — ACETAMINOPHEN 325 MG PO TABS
650.0000 mg | ORAL_TABLET | Freq: Once | ORAL | Status: AC
  Administered 2024-04-25: 650 mg via ORAL
  Filled 2024-04-25: qty 2

## 2024-04-25 MED ORDER — DIPHENHYDRAMINE HCL 25 MG PO CAPS
25.0000 mg | ORAL_CAPSULE | Freq: Once | ORAL | Status: AC
  Administered 2024-04-25: 25 mg via ORAL
  Filled 2024-04-25: qty 1

## 2024-04-25 NOTE — Progress Notes (Signed)
 Patient due for updated Actemra  infusion orders. D/w Waddell Craze, PA-C. Okay to increase Actemra  to 8mg /kg if patient is not experiencing any ongoing symptoms of infection. She is due to establish with [facility] on 05/07/2024 and would be due for next Actemra  dose on 05/16/2024  Patient is being transferred to [facility] infusion center  Left VM for patient regarding update.  Sherry Pennant, PharmD, MPH, BCPS, CPP Clinical Pharmacist [facility])

## 2024-04-25 NOTE — Progress Notes (Signed)
 Diagnosis: Rheumatoid Arthritis  Provider:  Praveen Mannam MD  Procedure: IV Infusion  IV Type: Peripheral, IV Location: L Antecubital  Actemra  (Tocilizumab ), Dose: 542mg   Infusion Start Time: 1411  Infusion Stop Time: 1513  Post Infusion IV Care: Peripheral IV Discontinued  Discharge: Condition: Good, Destination: Home . AVS Provided  Performed by:  Serayah Yazdani, RN

## 2024-04-25 NOTE — Progress Notes (Deleted)
 Diagnosis: Rheumatoid Arthritis  Provider:  Praveen Mannam MD  Procedure: IV Push  IV Type: Peripheral, IV Location: L Antecubital  Actemra  (Tocilizumab ), Dose: 542mg   Post Infusion IV Care: {CHINF Post Infusion:25398}  Discharge: Condition: Good, Destination: Home . {CHINFAVS:28985}  Performed by:  Janit Cutter, RN

## 2024-04-26 ENCOUNTER — Encounter (HOSPITAL_COMMUNITY): Payer: Self-pay | Admitting: Physician Assistant

## 2024-04-26 ENCOUNTER — Other Ambulatory Visit (HOSPITAL_COMMUNITY): Payer: Self-pay | Admitting: Physician Assistant

## 2024-04-26 DIAGNOSIS — Z111 Encounter for screening for respiratory tuberculosis: Secondary | ICD-10-CM | POA: Insufficient documentation

## 2024-04-26 NOTE — Progress Notes (Addendum)
 Received return VM from patient. She would like to move forward with increase Actemra  dose  Referral placed for Actemra  IV (G6737) at [facility] [phone]) [place] to start benefits investigation. Next dose is due 05/23/2024  Diagnosis: rheumatoid arthritis  Provider: Waddell Craze, PA-C and Maya Nash, MD  Dose: 800mg  every 28 days (she is maxed on this dose; her weight of 8mg /kg calculates to be greater than the recommended dose per prescribing information). **this is weight increase**  Premedications: acetaminophen  650mg  p.o. and diphenhydramine  25mg  p.o. Labs to be drawn with infusions: CBC/CMP at 1 month in Dec 2025 (due to dose increase) then every 3 months; TB gold yearly (next due April 2026)  Last dose/infusion date: 04/25/2024 Last Clinic Visit: 02/21/2024 Next Clinic Visit: 07/23/2024  Pertinent baseline labs: TB gold negative on 10/24/2023  Once benefits are processed, infusion center will contact patient to schedule.  Sherry Pennant, PharmD, MPH, BCPS, CPP Clinical Pharmacist [facility])

## 2024-05-03 ENCOUNTER — Encounter (HOSPITAL_COMMUNITY): Payer: Self-pay

## 2024-05-03 ENCOUNTER — Ambulatory Visit (HOSPITAL_COMMUNITY): Admitting: Clinical

## 2024-05-07 ENCOUNTER — Encounter: Payer: Self-pay | Admitting: Pulmonary Disease

## 2024-05-07 ENCOUNTER — Ambulatory Visit (INDEPENDENT_AMBULATORY_CARE_PROVIDER_SITE_OTHER): Admitting: Pulmonary Disease

## 2024-05-07 VITALS — BP 122/80 | HR 70 | Temp 98.4°F | Ht 63.0 in | Wt 304.4 lb

## 2024-05-07 DIAGNOSIS — M069 Rheumatoid arthritis, unspecified: Secondary | ICD-10-CM | POA: Diagnosis not present

## 2024-05-07 DIAGNOSIS — R0602 Shortness of breath: Secondary | ICD-10-CM

## 2024-05-07 MED ORDER — BUDESONIDE-FORMOTEROL FUMARATE 160-4.5 MCG/ACT IN AERO: 2.0000 | INHALATION_SPRAY | Freq: Two times a day (BID) | RESPIRATORY_TRACT | 12 refills | Status: AC

## 2024-05-07 NOTE — Patient Instructions (Signed)
 Trial with Breyna 2 puffs twice a day  Make sure you rinse your mouth after use  I will see you a year from now  Call us  with significant concerns  We can consider repeating your breathing study in a couple of years.  Can always be repeated if you are having acute symptoms

## 2024-05-07 NOTE — Progress Notes (Signed)
 Psychiatric Adult Assessment Progress Note  Patient Identification: Shannon David MRN:  [id number] Date of Evaluation:  05/21/2024  Assessment: Patient presents for a follow-up appointment. In the prior appointment, her psychotropic medication regimen was maintained as she was going fairly well post hospitalization in 02/2024.  Today, patient presents with worsened depression and anxiety in the context of several psychosocial stressors. She does utilize her coping strategies and is leaning into her support system. Behavioral activation discussed regarding physical activity in which she notes ongoing depression is unhelpful with her motivation. She provides future oriented statements with looking forward to seeing her therapist in which I recommend more frequent check ins with her. I believe therapy would be very helpful regarding her symptoms. She shows limited judgement regarding her marijuana use and is still in the precontemplative stage even after motivational interview.   We will increase her Abilify  to aid with augmentation with antidepressant and aid with mood stability. Patient does not feel propranolol  has been efficacious for her anxiety so we discussed tapering off. She also is at risk of OSA which we discussed that if she is deemed appropriate for a CPAP would be very helpful for her insomnia. She is awaiting to complete the study and she has done the intake appt already. Follow up in one month.   Plan:  # MDD, recurrent, moderate - Increase Abilify  to 5 mg daily  - Labs from 02/2024 monitoring reviewed - Continue Paxil  60 mg daily - Continue Metformin  500 mg BID for antipsychotic-induced weight gain  # GAD - Decrease Propranolol  20 mg TID PRN (is prescribed BID but is taking TID) -> 20 mg BID -> 20 mg daily -> STOP - Continue Gabapentin  300 mg TID (is prescribed BID but is taking TID) - Continue Atarax  25 mg TID PRN  # Insomnia - Sleep study ordered - Trazodone  100 mg at  bedtime nightly PRN  # Cannabis use disorder - Encourage cessation  Patient was given contact information for behavioral health clinic and was instructed to call 911 for emergencies.   Identifying Information: Shannon David is a 33 y.o. female with a history of MDD who presents in person to [facility] for mood.    Subjective:  Patient seen alone.  Since the previous visit, she reports feeling crappy. She states that she has a lot going on like her husband served her divorce papers on 10/22 and being unemployed. She is trying to hire a divorce lawyer to look over the forms. She is living with her dad now and he is asking her for money for rent which is also stressing her out. She states she has been crying. She states that she does not want to die but wants her problems to be over. She states her best friend has helped her during this time of stress and states that her best friend puts her first and pt feels guilty because she cannot return the favor. She states that she spends most of her time with her friend and they enjoy spending time with each other.   Patient reports the medications are helpful but states for the most part, they are helpful but right now I don't know what's going on. She notes going to her therapist and it is helpful and she has an appointment tomorrow. She states using the deep breathing and it has not been helpful. She notes having diarrhea and feels like this is due to the metformin .  Patient reports poor sleep, noting 4-5 hours of  sleep and having difficulty staying asleep. She states that did go to the initial intake for the sleep study and is waiting to do the actual sleep study. Patient reports fair appetite, stating she does not eat large portions. She states when she feels more sad, she dow not have an appetite. She denies doing physical activity due to her arthritis. Discussed behavioral activation to start low impact physical activity.    Patient denies current SI, HI, and AVH. We discussed a safety plan including warning signs, coping strategies, and possible people to contact and resources to utilize if SI worsen.   Substance use: Cannabis- three times a day- 3 blunts for anxiety   Past Psychiatric History: Current psychiatrist: Previously Dr. Rainelle, saw in 2024 Current therapist: Seeing regularly for about a year- Elgie Friedlander- seeing every 3 weeks Previous psychiatric diagnoses: MDD, GAD Current psychiatric medications: Paxil , gabapentin , propranolol , trazodone , hydroxyzine , and Abilify   Previous medication: Lexapro (ineffective 3 months), Cymbalta (ineffective 3 months)  Psychiatric medication history/compliance: Reports adherence, father confirms Psychiatric hospitalization(s): Adjustment disorder ISO NSSI (scratching arm with scissors) 04/21/23-10/29. [facility] in from 9/16-9/20 due for suicidal gesture (took 8 gabapentin  and 1 abilify ).   Neuromodulation history: Denies History of suicide (obtained from HPI): Reports no history of suicide attempts, borne out in chart review History of homicide or aggression (obtained in HPI): None known SIB: yes, first time was in 2024 and most recently 02/2024  Substance Abuse History: Alcohol: Occasionally, one wine cooler about 2 a month Tobacco: Denies Cannabis: Twice a day (2 blunts daily) with the intention to aid with anxiety, using since February IV drug use: Denies Prescription drug use: Denies Other illicit drugs: Denies Rehab history: Denies   Past Medical History: PCP: Dr. Nestor Medical diagnoses: RA, hypothyroidism, IBS, PCOS, HTN Medications: Hydroxychloroquine , methotrexate  1/week, losartan , levothyroxine  112 mcg qday, vitamin d3, breo elliptaalbuterol, propranolol  20 mg BID, Trazodone  100 mg, gabapentin  300 mg BID Allergies: Keflex (hives)  Hospitalizations: Denies recently Surgeries: Foot surgeries Trauma: Denies Seizures: ?PNES vs Seizure activity over  the last year, neuro workup negative, did not make followup   Social History: Living situation: With dad Education: Some college Occupational history: applying for disability Marital status: Single Children: Denies Legal: Denies Military: None   Access to firearms: Patient and family denies   Family Psychiatric History: Psychiatric diagnoses: Mother and brother- bipolar Suicide history: Denies Violence/aggression: Denies Substance use history: Denies  Past Medical History:  Past Medical History:  Diagnosis Date   Anxiety    COVID    History of Clostridium difficile infection 01/2023   History of hypothyroidism    History of PCOS    Major depressive disorder, recurrent severe without psychotic features (HCC)    MDD (major depressive disorder), recurrent episode, moderate (HCC) 04/21/2023   Pt presented for self harm by cutting wrist at work     Passive suicidal ideations 11/11/2022   Rheumatoid arthritis (HCC)    Severe recurrent major depression without psychotic features (HCC) 05/06/2023   Suicidal behavior with attempted self-injury (HCC) 04/22/2023    Past Surgical History:  Procedure Laterality Date   BUNIONECTOMY Right    FOOT SURGERY Right 08/24/2021    Family History:  Family History  Problem Relation Age of Onset   Depression Mother    Bipolar disorder Mother    Hypertension Mother    Clotting disorder Mother    Depression Brother    Rheum arthritis Brother    Lung cancer Paternal Uncle    Cancer Maternal  Grandmother        lung    Social History   Socioeconomic History   Marital status: Legally Separated    Spouse name: Not on file   Number of children: 0   Years of education: Not on file   Highest education level: High school graduate  Occupational History   Not on file  Tobacco Use   Smoking status: Never    Passive exposure: Current   Smokeless tobacco: Never  Vaping Use   Vaping status: Never Used  Substance and Sexual Activity    Alcohol use: No   Drug use: No   Sexual activity: Not on file  Other Topics Concern   Not on file  Social History Narrative   Left Handed    Lives in a one story home   Drinks Caffeine    Social Drivers of Health   Financial Resource Strain: Not on file  Food Insecurity: Low Risk  (03/19/2024)   Received from [facility]   Hunger Vital Sign    Within the past 12 months, you worried that your food would run out before you got money to buy more: Never true    Within the past 12 months, the food you bought just didn't last and you didn't have money to get more. : Never true  Transportation Needs: No Transportation Needs (03/19/2024)   Received from [facility]    In the past 12 months, has lack of reliable transportation kept you from medical appointments, meetings, work or from getting things needed for daily living? : No  Physical Activity: Not on file  Stress: Not on file  Social Connections: Not on file    Allergies:  Allergies  Allergen Reactions   Cephalexin Hives    Keflex    Current Medications: Current Outpatient Medications  Medication Sig Dispense Refill   albuterol  (VENTOLIN  HFA) 108 (90 Base) MCG/ACT inhaler Inhale 1-2 puffs into the lungs every 6 (six) hours as needed for wheezing or shortness of breath. 1 each 0   ALPRAZolam  (XANAX ) 0.5 MG tablet Take 1 tablet (0.5 mg total) by mouth at bedtime as needed (for sleep study premedication.). 3 tablet 0   ARIPiprazole  (ABILIFY ) 2 MG tablet TAKE 1 TABLET BY MOUTH EVERY DAY 90 tablet 1   budesonide -formoterol  (BREYNA ) 160-4.5 MCG/ACT inhaler Inhale 2 puffs into the lungs in the morning and at bedtime. 1 each 12   fluticasone  furoate-vilanterol (BREO ELLIPTA ) 100-25 MCG/ACT AEPB Inhale 1 puff into the lungs daily. 60 each 3   folic acid  (FOLVITE ) 1 MG tablet Take 2 mg by mouth daily.     gabapentin  (NEURONTIN ) 300 MG capsule Take 1 capsule (300 mg total) by mouth 2 (two) times daily. 60 capsule 1    hydroxychloroquine  (PLAQUENIL ) 200 MG tablet Take 1 tablet (200 mg total) by mouth 2 (two) times daily. 60 tablet 0   hydrOXYzine  (ATARAX ) 25 MG tablet Take 1 tablet (25 mg total) by mouth 3 (three) times daily as needed for anxiety. 30 tablet 0   levothyroxine  (SYNTHROID ) 112 MCG tablet Take 1 tablet (112 mcg total) by mouth daily. 30 tablet 0   losartan  (COZAAR ) 25 MG tablet Take 25 mg by mouth daily.     metFORMIN  (GLUCOPHAGE ) 500 MG tablet TAKE 1 TABLET BY MOUTH 2 TIMES DAILY WITH A MEAL. 180 tablet 1   methotrexate  (RHEUMATREX) 2.5 MG tablet TAKE 8 TABLETS (20 MG TOTAL) BY MOUTH ONCE A WEEK. CAUTION:CHEMOTHERAPY. PROTECT FROM LIGHT. 96  tablet 0   ondansetron  (ZOFRAN ) 4 MG tablet Take 4 mg by mouth every 8 (eight) hours as needed.     PARoxetine  (PAXIL ) 30 MG tablet Take 2 tablets (60 mg total) by mouth daily. 60 tablet 1   propranolol  (INDERAL ) 20 MG tablet Take 1 tablet (20 mg total) by mouth 2 (two) times daily as needed. 60 tablet 0   Tocilizumab  (ACTEMRA ) 80 MG/4ML SOLN injection INFUSE 4MG /KG INTRAVENOUSLY EVERY 4 WEEKS (Body weight 123.9kg) 28 mL 5   traZODone  (DESYREL ) 100 MG tablet TAKE 1 TABLET BY MOUTH AT BEDTIME AS NEEDED FOR SLEEP. 90 tablet 1   No current facility-administered medications for this visit.    Objective:  Psychiatric Specialty Exam: General Appearance: appears at stated age, casually dressed and groomed   Behavior: tearful  Psychomotor Activity: no psychomotor agitation or retardation noted   Eye Contact: fair  Speech: normal amount, volume and fluency    Mood: depressed Affect: congruent  Thought Process: linear, goal directed, no circumstantial or tangential thought process noted, no racing thoughts or flight of ideas  Descriptions of Associations: intact   Thought Content Hallucinations: denies AH, VH , does not appear responding to stimuli  Delusions: no paranoia, delusions of control, grandeur, ideas of reference, thought broadcasting, and  magical thinking  Suicidal Thoughts: denies SI, intention, plan  Homicidal Thoughts: denies HI, intention, plan   Alertness/Orientation: alert and fully oriented   Insight: fair Judgment: limited  Memory: intact   Executive Functions  Concentration: intact  Attention Span: fair  Recall: intact  Fund of Knowledge: fair   Physical Exam  General: Pleasant, well-appearing. No acute distress. Pulmonary: Normal effort. No wheezing or rales. Skin: No obvious rash or lesions. Neuro: A&Ox3.No focal deficit.  Review of Systems  No reported symptoms   Metabolic Disorder Labs: Lab Results  Component Value Date   HGBA1C 4.5 (L) 03/15/2024   MPG 82.45 03/15/2024   No results found for: PROLACTIN Lab Results  Component Value Date   CHOL 178 01/23/2024   TRIG 91 01/23/2024   HDL 48 (L) 01/23/2024   CHOLHDL 3.7 01/23/2024   VLDL 80.1 07/21/2023   LDLCALC 111 (H) 01/23/2024   LDLCALC 95 07/21/2023   Lab Results  Component Value Date   TSH 5.563 (H) 03/12/2024    Therapeutic Level Labs: No results found for: LITHIUM No results found for: CBMZ No results found for: VALPROATE  Screenings:  AUDIT    Flowsheet Row [facility] (Dis[facility]  Alcohol Use Disorder Identification Test Final Score (AUDIT) 1 0   GAD-7    Flowsheet Row Counselor from 08/17/2023 in [facility] [place] Counselor from 12/23/2022 in [facility]  Total GAD-7 Score 4 2   PHQ2-9    Flowsheet Row Counselor from 08/17/2023 in [facility] at [facility] from 06/02/2023 in BEHAVIORAL HEALTH INTENSIVE PSYCH Clinical Support from 05/25/2023 in [facility] Counselor from 05/06/2023 in BEHAVIORAL HEALTH PARTIAL  HOSPITALIZATION PROGRAM Counselor from 04/28/2023 in BEHAVIORAL HEALTH PARTIAL HOSPITALIZATION PROGRAM  PHQ-2 Total Score 0 3 3 2 2   PHQ-9 Total Score -- 18 14 13 15    Flowsheet Row [facility]) [facility] ED from 03/12/2024 in [facility] at [facility] ED from 01/08/2024 in  [place] [facility] at [facility]  C-SSRS RISK CATEGORY Low Risk High Risk No Risk    Collaboration of Care: Case discussed with attending, see attending's attestation for additional information.  Ismael Franco, MD PGY-3 Psychiatry Resident

## 2024-05-07 NOTE — Progress Notes (Signed)
 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   ARIPiprazole  (ABILIFY ) 2 MG tablet Take 1 tablet (2 mg total) by mouth daily.   budesonide-formoterol (BREYNA) 160-4.5 MCG/ACT inhaler Inhale 2 puffs into the lungs in the morning and at bedtime.   fluticasone  furoate-vilanterol (BREO ELLIPTA ) 100-25 MCG/ACT AEPB Inhale 1 puff into the lungs daily.   folic acid  (FOLVITE ) 1 MG tablet Take 2 mg by mouth daily.   gabapentin  (NEURONTIN ) 300 MG capsule Take 1 capsule (300 mg total) by mouth 2 (two) times daily.   hydroxychloroquine  (PLAQUENIL ) 200 MG tablet Take 1 tablet (200 mg total) by mouth 2 (two) times daily.   hydrOXYzine  (ATARAX ) 25 MG tablet Take 1 tablet (25 mg total) by mouth 3 (three) times daily as needed for anxiety.   levothyroxine  (SYNTHROID ) 112 MCG tablet Take 1 tablet (112 mcg total) by mouth daily.   losartan  (COZAAR ) 25 MG tablet Take 25 mg by mouth daily.   metFORMIN  (GLUCOPHAGE ) 500 MG tablet Take 1 tablet (500 mg total) by mouth 2 (two) times daily with a meal.   methotrexate   (RHEUMATREX) 2.5 MG tablet TAKE 8 TABLETS (20 MG TOTAL) BY MOUTH ONCE A WEEK. CAUTION:CHEMOTHERAPY. PROTECT FROM LIGHT.   ondansetron  (ZOFRAN ) 4 MG tablet Take 4 mg by mouth every 8 (eight) hours as needed.   PARoxetine  (PAXIL ) 30 MG tablet Take 2 tablets (60 mg total) by mouth daily.   predniSONE  (DELTASONE ) 20 MG tablet Take 1 tablet (20 mg total) by mouth daily with breakfast.   propranolol  (INDERAL ) 20 MG tablet Take 1 tablet (20 mg total) by mouth 2 (two) times daily as needed.   Tocilizumab  (ACTEMRA ) 80 MG/4ML SOLN injection INFUSE 4MG /KG INTRAVENOUSLY EVERY 4 WEEKS (Body weight 123.9kg)   traZODone  (DESYREL ) 100 MG tablet Take 1 tablet (100 mg total) by mouth at bedtime as needed for sleep.   [DISCONTINUED] azithromycin  (ZITHROMAX  Z-PAK) 250 MG tablet Take 2 tablets day 1 and then 1 daily for 4 days   [DISCONTINUED] Cholecalciferol  (VITAMIN D3) 50 MCG (2000 UT) capsule Take 2,000 Units by mouth daily.   No facility-administered encounter medications on file as of 05/07/2024.    Allergies as of 05/07/2024 - Review Complete 05/07/2024  Allergen Reaction Noted   Cephalexin Hives 09/12/2012    Past Medical History:  Diagnosis Date   Anxiety    COVID    History of Clostridium difficile infection 01/2023   History of hypothyroidism    History of PCOS    Major depressive  disorder, recurrent severe without psychotic features (HCC)    MDD (major depressive disorder), recurrent episode, moderate (HCC) 04/21/2023   Pt presented for self harm by cutting wrist at work     Passive suicidal ideations 11/11/2022   Rheumatoid arthritis (HCC)    Severe recurrent major depression without psychotic features (HCC) 05/06/2023   Suicidal behavior with attempted self-injury (HCC) 04/22/2023    Past Surgical History:  Procedure Laterality Date   BUNIONECTOMY Right    FOOT SURGERY Right 08/24/2021    Family History  Problem Relation Age of Onset   Depression Mother    Bipolar disorder Mother     Hypertension Mother    Clotting disorder Mother    Depression Brother    Rheum arthritis Brother    Lung cancer Paternal Uncle    Cancer Maternal Grandmother        lung    Social History   Socioeconomic History   Marital status: Legally Separated    Spouse name: Not on file   Number of children: 0   Years of education: Not on file   Highest education level: High school graduate  Occupational History   Not on file  Tobacco Use   Smoking status: Never    Passive exposure: Current   Smokeless tobacco: Never  Vaping Use   Vaping status: Never Used  Substance and Sexual Activity   Alcohol use: No   Drug use: No   Sexual activity: Not on file  Other Topics Concern   Not on file  Social History Narrative   Left Handed    Lives in a one story home   Drinks Caffeine    Social Drivers of Health   Financial Resource Strain: Not on file  Food Insecurity: Low Risk  (03/19/2024)   Received from [facility]   Hunger Vital Sign    Within the past 12 months, you worried that your food would run out before you got money to buy more: Never true    Within the past 12 months, the food you bought just didn't last and you didn't have money to get more. : Never true  Transportation Needs: No Transportation Needs (03/19/2024)   Received from [facility]    In the past 12 months, has lack of reliable transportation kept you from medical appointments, meetings, work or from getting things needed for daily living? : No  Physical Activity: Not on file  Stress: Not on file  Social Connections: Not on file  Intimate Partner Violence: Not At Risk (03/13/2024)   Humiliation, Afraid, Rape, and Kick questionnaire    Fear of Current or Ex-Partner: No    Emotionally Abused: No    Physically Abused: No    Sexually Abused: No    Review of Systems  Respiratory:  Positive for cough and shortness of breath.     Vitals:   05/07/24 1535  BP: 122/80  Pulse: 70  Temp: 98.4  F (36.9 C)  SpO2: 97%   Physical Exam Constitutional:      Appearance: She is obese.  HENT:     Head: Normocephalic.     Mouth/Throat:     Mouth: Mucous membranes are moist.  Eyes:     General: No scleral icterus. Cardiovascular:     Rate and Rhythm: Normal rate and regular rhythm.     Heart sounds: No murmur heard.    No friction rub.  Pulmonary:     Effort: No respiratory distress.  Breath sounds: No stridor. No wheezing or rhonchi.  Musculoskeletal:     Cervical back: No rigidity or tenderness.  Neurological:     Mental Status: She is alert.  Psychiatric:        Mood and Affect: Mood normal.    Data Reviewed: CT scan of the chest reviewed 03/02/2024  PFT with no obstruction, no restriction, normal diffusing capacity  Assessment/Plan: Shortness of breath  Normal PFT  CT scan with ground glass changes  History of rheumatoid arthritis  Will try Breyna in place of Breo as Breo was not covered  Albuterol  as needed  Not having any acute symptoms to justify a course of antibiotics  May discontinue Breyna if she does not feel any improvement in symptoms  Encouraged to call with significant concerns  Follow-up a year from now   Jennet Epley MD [facility] 05/07/2024, 4:14 PM  CC: Epley Jennet LABOR, MD

## 2024-05-08 ENCOUNTER — Encounter: Payer: Self-pay | Admitting: Neurology

## 2024-05-08 ENCOUNTER — Encounter (HOSPITAL_COMMUNITY): Payer: Self-pay | Admitting: Physician Assistant

## 2024-05-08 ENCOUNTER — Ambulatory Visit: Payer: Self-pay | Admitting: Neurology

## 2024-05-08 VITALS — BP 130/86 | HR 78 | Ht 63.0 in | Wt 304.0 lb

## 2024-05-08 DIAGNOSIS — M0579 Rheumatoid arthritis with rheumatoid factor of multiple sites without organ or systems involvement: Secondary | ICD-10-CM

## 2024-05-08 DIAGNOSIS — R635 Abnormal weight gain: Secondary | ICD-10-CM | POA: Diagnosis not present

## 2024-05-08 DIAGNOSIS — F322 Major depressive disorder, single episode, severe without psychotic features: Secondary | ICD-10-CM

## 2024-05-08 DIAGNOSIS — F5104 Psychophysiologic insomnia: Secondary | ICD-10-CM | POA: Diagnosis not present

## 2024-05-08 DIAGNOSIS — G8929 Other chronic pain: Secondary | ICD-10-CM | POA: Insufficient documentation

## 2024-05-08 DIAGNOSIS — T50905A Adverse effect of unspecified drugs, medicaments and biological substances, initial encounter: Secondary | ICD-10-CM

## 2024-05-08 DIAGNOSIS — F3341 Major depressive disorder, recurrent, in partial remission: Secondary | ICD-10-CM

## 2024-05-08 MED ORDER — ALPRAZOLAM 0.5 MG PO TABS: 0.5000 mg | ORAL_TABLET | Freq: Every evening | ORAL | 0 refills | Status: AC | PRN
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
## 2024-05-13 ENCOUNTER — Other Ambulatory Visit (HOSPITAL_COMMUNITY): Payer: Self-pay | Admitting: Psychiatry

## 2024-05-13 DIAGNOSIS — F322 Major depressive disorder, single episode, severe without psychotic features: Secondary | ICD-10-CM

## 2024-05-13 DIAGNOSIS — G4700 Insomnia, unspecified: Secondary | ICD-10-CM

## 2024-05-13 DIAGNOSIS — T50905A Adverse effect of unspecified drugs, medicaments and biological substances, initial encounter: Secondary | ICD-10-CM

## 2024-05-21 ENCOUNTER — Ambulatory Visit (HOSPITAL_BASED_OUTPATIENT_CLINIC_OR_DEPARTMENT_OTHER): Admitting: Psychiatry

## 2024-05-21 VITALS — BP 135/84 | Wt 304.0 lb

## 2024-05-21 DIAGNOSIS — F411 Generalized anxiety disorder: Secondary | ICD-10-CM

## 2024-05-21 DIAGNOSIS — T50905A Adverse effect of unspecified drugs, medicaments and biological substances, initial encounter: Secondary | ICD-10-CM

## 2024-05-21 DIAGNOSIS — R635 Abnormal weight gain: Secondary | ICD-10-CM

## 2024-05-21 DIAGNOSIS — G4700 Insomnia, unspecified: Secondary | ICD-10-CM | POA: Diagnosis not present

## 2024-05-21 DIAGNOSIS — F322 Major depressive disorder, single episode, severe without psychotic features: Secondary | ICD-10-CM | POA: Diagnosis not present

## 2024-05-21 MED ORDER — GABAPENTIN 300 MG PO CAPS: 300.0000 mg | ORAL_CAPSULE | Freq: Three times a day (TID) | ORAL | 1 refills | Status: DC

## 2024-05-21 MED ORDER — METFORMIN HCL 500 MG PO TABS: 500.0000 mg | ORAL_TABLET | Freq: Two times a day (BID) | ORAL | 0 refills | Status: DC

## 2024-05-21 MED ORDER — TRAZODONE HCL 100 MG PO TABS: 100.0000 mg | ORAL_TABLET | Freq: Every evening | ORAL | 1 refills | Status: DC | PRN

## 2024-05-21 MED ORDER — ARIPIPRAZOLE 5 MG PO TABS: 5.0000 mg | ORAL_TABLET | Freq: Every day | ORAL | 1 refills | Status: DC

## 2024-05-21 MED ORDER — PAROXETINE HCL 30 MG PO TABS: 60.0000 mg | ORAL_TABLET | Freq: Every day | ORAL | 1 refills | Status: DC

## 2024-05-21 MED ORDER — HYDROXYZINE HCL 25 MG PO TABS: 25.0000 mg | ORAL_TABLET | Freq: Three times a day (TID) | ORAL | 2 refills | Status: DC | PRN

## 2024-05-21 NOTE — Addendum Note (Signed)
 Addended by: CARVIN CROCK on: 05/21/2024 02:05 PM   Modules accepted: Level of Service

## 2024-05-21 NOTE — Patient Instructions (Signed)
 Thank you for attending your appointment today.  -- Increase Abilify  to 5 mg -- Decrease propranolol  to 20 mg twice a day for one week then after take once daily for one week then stop -- Continue other medications as prescribed.  Please do not make any changes to medications without first discussing with your provider. If you are experiencing a psychiatric emergency, please call 911 or present to your nearest emergency department. Additional crisis, medication management, and therapy resources are included below.  [facility]  [address] [place], [place] [address] [phone] WALK-IN URGENT CARE 24/7 FOR ANYONE [address] [place], [place]  [phone] Fax: [phone] [email or web address] *Interpreters available *Accepts all insurance and uninsured for Urgent Care needs *Accepts Medicaid and uninsured for outpatient treatment (below)      ONLY FOR [facility]  Below:    Outpatient New Patient Assessment/Therapy Walk-ins:        Monday, Wednesday, and Thursday 8am until slots are full (first come, first served)                   New Patient Psychiatry/Medication Management        Monday-Friday 8am-11am (first come, first served)               For all walk-ins we ask that you arrive by 7:15am, because patients will be seen in the order of arrival.

## 2024-05-22 ENCOUNTER — Ambulatory Visit (INDEPENDENT_AMBULATORY_CARE_PROVIDER_SITE_OTHER): Admitting: Clinical

## 2024-05-22 DIAGNOSIS — F322 Major depressive disorder, single episode, severe without psychotic features: Secondary | ICD-10-CM

## 2024-05-22 DIAGNOSIS — F411 Generalized anxiety disorder: Secondary | ICD-10-CM
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
## 2024-05-23 ENCOUNTER — Ambulatory Visit (HOSPITAL_COMMUNITY)
Admission: RE | Admit: 2024-05-23 | Discharge: 2024-05-23 | Disposition: A | Discharge status: Home / Self Care | Source: Ambulatory Visit | Attending: Physician Assistant | Admitting: Physician Assistant

## 2024-05-23 VITALS — BP 128/74 | HR 85 | Temp 98.1°F | Resp 16 | Wt 298.0 lb

## 2024-05-23 DIAGNOSIS — Z111 Encounter for screening for respiratory tuberculosis: Secondary | ICD-10-CM | POA: Insufficient documentation

## 2024-05-23 DIAGNOSIS — M0579 Rheumatoid arthritis with rheumatoid factor of multiple sites without organ or systems involvement: Secondary | ICD-10-CM | POA: Insufficient documentation

## 2024-05-23 DIAGNOSIS — Z79899 Other long term (current) drug therapy: Secondary | ICD-10-CM | POA: Diagnosis not present

## 2024-05-23 MED ORDER — ACETAMINOPHEN 325 MG PO TABS
ORAL_TABLET | ORAL | Status: AC
Start: 2024-05-23 — End: 2024-05-23
  Filled 2024-05-23: qty 2

## 2024-05-23 MED ORDER — ACETAMINOPHEN 325 MG PO TABS
650.0000 mg | ORAL_TABLET | Freq: Once | ORAL | Status: AC
  Administered 2024-05-23: 650 mg via ORAL

## 2024-05-23 MED ORDER — TOCILIZUMAB 400 MG/20ML IV SOLN
800.0000 mg | Freq: Once | INTRAVENOUS | Status: AC
  Administered 2024-05-23: 800 mg via INTRAVENOUS
  Filled 2024-05-23: qty 40

## 2024-05-23 MED ORDER — DIPHENHYDRAMINE HCL 25 MG PO CAPS
ORAL_CAPSULE | ORAL | Status: AC
Start: 2024-05-23 — End: 2024-05-23
  Filled 2024-05-23: qty 1

## 2024-05-23 MED ORDER — DIPHENHYDRAMINE HCL 25 MG PO CAPS
25.0000 mg | ORAL_CAPSULE | Freq: Once | ORAL | Status: AC
  Administered 2024-05-23: 25 mg via ORAL

## 2024-05-24 ENCOUNTER — Ambulatory Visit (HOSPITAL_COMMUNITY): Admitting: Clinical

## 2024-05-25 ENCOUNTER — Encounter (HOSPITAL_COMMUNITY): Payer: Self-pay | Admitting: Clinical

## 2024-05-25 LAB — GENECONNECT MOLECULAR SCREEN: Genetic Analysis Overall Interpretation: NEGATIVE

## 2024-05-30 ENCOUNTER — Ambulatory Visit (INDEPENDENT_AMBULATORY_CARE_PROVIDER_SITE_OTHER): Admitting: Clinical

## 2024-05-30 DIAGNOSIS — F322 Major depressive disorder, single episode, severe without psychotic features: Secondary | ICD-10-CM

## 2024-05-30 DIAGNOSIS — F411 Generalized anxiety disorder: Secondary | ICD-10-CM

## 2024-05-30 DIAGNOSIS — F6089 Other specific personality disorders: Secondary | ICD-10-CM | POA: Diagnosis not present

## 2024-05-30 DIAGNOSIS — F329 Major depressive disorder, single episode, unspecified: Secondary | ICD-10-CM

## 2024-06-01 ENCOUNTER — Encounter (HOSPITAL_COMMUNITY): Payer: Self-pay | Admitting: Clinical

## 2024-06-01 NOTE — Progress Notes (Signed)
 Therapy Group Progress Note   05/30/2024  Group Time:  5:00pm-6:00pm  Participation Level:  Active   Behavioral Response: Appropriate and Listening, contributed several times   Type of Therapy: Group Therapy   Check-In:  Izela Altier shared that the week has been rough because she cannot pay her rent and does not yet have resolution to her disability application, is seeking a clinical research associate.  She did not talk a great deal but did introduce topics/concerns a few times.    Current Concerns Shared:  Current issues identified by [name] are her housing and financial problems.  Intervention(s):  CSW shared the CBT coping tools of Scheduling Worry Time in detail, with the concept of Thought Stopping to avoid worrying at times other than the scheduled worry time.  Questions were answered to everyone's satisfaction until all participants could verbalize full understanding.  Summary of Progress:  Alzora Neuenfeldt verbalized appropriate understanding of concepts about the techniques of Thought Stopping and Scheduling Worry Time (from Cognitive Behavioral Therapy) as presented.  She furthermore made a commitment to try these techniques prior to her next appointment.  Before the end of the session, another patient brought up the concept of positive affirmations and also issued a challenge for all participants to engage in these until next session.  CSW pointed out that (1) what we focus on grows and (2) if we hear something often enough, we start to believe it.  Recommendations:  It is recommended that Coleen Briski continue considering all brought up by CSW and fellow patients throughout group and return to group at next scheduled time or to individual therapy at next scheduled session.  It is further recommended that patient attempt to actively practice Scheduling Worry Time and Thought Stopping as taught in session.  She verbalized a commitment to use positive affirmations such as I am worthy and to  put the mask on myself first.  Progress Towards Goals: Progressing   Encounter Diagnoses  Name Primary?   MDD (major depressive disorder), severe (HCC) Yes   GAD (generalized anxiety disorder)    Cluster B personality disorder [facility])      Virtual Visit via Video Note  I connected with Veleria Popko on 05/30/2024 at 5:00pm by [employer] Teams and verified that I am speaking with the correct person using two identifiers.  Location: Patient: home Provider: [facility]   I discussed the limitations of evaluation and management by telemedicine and the availability of in person appointments. The patient expressed understanding and agreed to proceed.   I discussed the assessment and treatment plan with the patient. The patient was provided an opportunity to ask questions and all were answered. The patient agreed with the plan and demonstrated an understanding of the instructions.   The patient was advised to call back or seek an in-person evaluation if the symptoms worsen or if the condition fails to improve as anticipated.  I provided 60 minutes of non-face-to-face time during this encounter.  Elgie Crest, LCSW 06/01/2024, 12:39 PM

## 2024-06-11 ENCOUNTER — Telehealth: Payer: Self-pay | Admitting: Neurology

## 2024-06-11 NOTE — Telephone Encounter (Signed)
 Split M[employer] auth: [id number] (exp. 05/17/24 to 08/15/24)  Shannon David

## 2024-06-13 ENCOUNTER — Ambulatory Visit (HOSPITAL_COMMUNITY): Admitting: Clinical

## 2024-06-14 ENCOUNTER — Ambulatory Visit (HOSPITAL_COMMUNITY): Admitting: Clinical

## 2024-06-14 NOTE — Progress Notes (Signed)
 Therapy Progress Note  Patient had an appointment scheduled with therapist on 06/14/2024  at 10:00am.  CSW called patient at 10:10, 10:15, and 10:20 and was informed the calls could not go through.  She did not arrive for the session by 10:20am, so was considered a no show.  As per [facility] policy, this will be noted as a no-show appointment.    Encounter Diagnosis  Name Primary?   No-show for appointment Yes     Elgie Crest, LCSW 06/14/2024, 10:59 AM

## 2024-06-15 ENCOUNTER — Encounter (HOSPITAL_COMMUNITY): Payer: Self-pay

## 2024-06-15 ENCOUNTER — Emergency Department (HOSPITAL_COMMUNITY)
Admission: EM | Admit: 2024-06-15 | Discharge: 2024-06-15 | Discharge status: Against Medical Advice | Attending: Emergency Medicine | Admitting: Emergency Medicine

## 2024-06-15 DIAGNOSIS — Z5321 Procedure and treatment not carried out due to patient leaving prior to being seen by health care provider: Secondary | ICD-10-CM | POA: Diagnosis not present

## 2024-06-15 DIAGNOSIS — N644 Mastodynia: Secondary | ICD-10-CM | POA: Insufficient documentation

## 2024-06-15 DIAGNOSIS — R202 Paresthesia of skin: Secondary | ICD-10-CM | POA: Diagnosis not present

## 2024-06-15 NOTE — ED Triage Notes (Signed)
 Pt arrived via POV, states right breast has felt numb and tingling with intermittent pain x2 weeks.

## 2024-06-15 NOTE — ED Notes (Signed)
 Patient said they are leaving. They do not want to wait any longer. Patient walked out of the department.

## 2024-06-22 ENCOUNTER — Ambulatory Visit (HOSPITAL_COMMUNITY)
Admission: RE | Admit: 2024-06-22 | Discharge: 2024-06-22 | Disposition: A | Discharge status: Home / Self Care | Source: Ambulatory Visit | Attending: Physician Assistant | Admitting: Physician Assistant

## 2024-06-22 VITALS — BP 128/85 | HR 73 | Temp 98.3°F | Resp 18

## 2024-06-22 DIAGNOSIS — Z79899 Other long term (current) drug therapy: Secondary | ICD-10-CM | POA: Diagnosis present

## 2024-06-22 DIAGNOSIS — M0579 Rheumatoid arthritis with rheumatoid factor of multiple sites without organ or systems involvement: Secondary | ICD-10-CM | POA: Insufficient documentation

## 2024-06-22 DIAGNOSIS — Z111 Encounter for screening for respiratory tuberculosis: Secondary | ICD-10-CM | POA: Diagnosis present

## 2024-06-22 LAB — CBC WITH DIFFERENTIAL/PLATELET
Abs Immature Granulocytes: 0.01 K/uL (ref 0.00–0.07)
Basophils Absolute: 0 K/uL (ref 0.0–0.1)
Basophils Relative: 1 %
Eosinophils Absolute: 0.2 K/uL (ref 0.0–0.5)
Eosinophils Relative: 3 %
HCT: 40.9 % (ref 36.0–46.0)
Hemoglobin: 14.2 g/dL (ref 12.0–15.0)
Immature Granulocytes: 0 %
Lymphocytes Relative: 20 %
Lymphs Abs: 1.1 K/uL (ref 0.7–4.0)
MCH: 32.7 pg (ref 26.0–34.0)
MCHC: 34.7 g/dL (ref 30.0–36.0)
MCV: 94.2 fL (ref 80.0–100.0)
Monocytes Absolute: 0.6 K/uL (ref 0.1–1.0)
Monocytes Relative: 10 %
Neutro Abs: 3.7 K/uL (ref 1.7–7.7)
Neutrophils Relative %: 66 %
Platelets: 197 K/uL (ref 150–400)
RBC: 4.34 MIL/uL (ref 3.87–5.11)
RDW: 12.6 % (ref 11.5–15.5)
WBC: 5.6 K/uL (ref 4.0–10.5)
nRBC: 0 % (ref 0.0–0.2)

## 2024-06-22 LAB — COMPREHENSIVE METABOLIC PANEL WITH GFR
ALT: 35 U/L (ref 0–44)
AST: 24 U/L (ref 15–41)
Albumin: 4.3 g/dL (ref 3.5–5.0)
Alkaline Phosphatase: 68 U/L (ref 38–126)
Anion gap: 9 (ref 5–15)
BUN: 12 mg/dL (ref 6–20)
CO2: 22 mmol/L (ref 22–32)
Calcium: 8.7 mg/dL — ABNORMAL LOW (ref 8.9–10.3)
Chloride: 103 mmol/L (ref 98–111)
Creatinine, Ser: 0.71 mg/dL (ref 0.44–1.00)
GFR, Estimated: >60 mL/min
Glucose, Bld: 90 mg/dL (ref 70–99)
Potassium: 3.9 mmol/L (ref 3.5–5.1)
Sodium: 135 mmol/L (ref 135–145)
Total Bilirubin: 0.5 mg/dL (ref 0.0–1.2)
Total Protein: 6.8 g/dL (ref 6.5–8.1)

## 2024-06-22 MED ORDER — TOCILIZUMAB 400 MG/20ML IV SOLN
800.0000 mg | Freq: Once | INTRAVENOUS | Status: AC
  Administered 2024-06-22: 800 mg via INTRAVENOUS
  Filled 2024-06-22: qty 40

## 2024-06-22 MED ORDER — ACETAMINOPHEN 325 MG PO TABS: 650.0000 mg | ORAL_TABLET | Freq: Once | ORAL | Status: DC

## 2024-06-22 MED ORDER — DIPHENHYDRAMINE HCL 25 MG PO CAPS: 25.0000 mg | ORAL_CAPSULE | Freq: Once | ORAL | Status: DC

## 2024-06-24 ENCOUNTER — Ambulatory Visit: Payer: Self-pay | Admitting: Physician Assistant

## 2024-06-24 NOTE — Progress Notes (Signed)
 CBC WNL Calcium is low-8.7-increase calcium intake. Rest of CMP WNL

## 2024-07-01 NOTE — Progress Notes (Unsigned)
 " Psychiatric Adult Assessment Progress Note Televisit via video: I connected with Shannon David on 1/5 at 11:00 AM EST by a video enabled telemedicine application and verified that I am speaking with the correct person using two identifiers.  Location: Patient: home Provider: office   I discussed the limitations of evaluation and management by telemedicine and the availability of in person appointments. The patient expressed understanding and agreed to proceed.  I discussed the assessment and treatment plan with the patient. The patient was provided an opportunity to ask questions and all were answered. The patient agreed with the plan and demonstrated an understanding of the instructions.   The patient was advised to call back or seek an in-person evaluation if the symptoms worsen or if the condition fails to improve as anticipated.   Patient Identification: Shannon David MRN:  [id number] Date of Evaluation:  07/02/2024  Assessment: Patient presents for a follow-up appointment. In the prior appointment, we increased her Abilify  to aid with augmentation with antidepressant and aid with mood stability. Propranolol  was also tapered of due to med being ineffective.   Today, patient is doing fairly well regarding her symptoms of depression and anxiety amidst the psychosocial stressors.  She is wanting to start the cross titration from Paxil  to Zoloft  due to worry of future potential side effects that Paxil  carries.  I discussed the risk, benefits, and adverse effects of starting Zoloft .  I also discussed the risks of serotonin withdrawal symptoms with Paxil  and that is why we will gradually decrease the medication and I will see the patient in about a month in the middle of the cross titration to make sure she is tolerating any potential side effects.  We also discussed distress tolerance coping strategies. She is still using marijuana frequently and she is in the contemplative stage of cessation.  Reassuringly, she is managing her stress appropriately with surrounding herself with her support system. She is also not utilizing her gabapentin  as frequently as weel with indicates to be her improvement.  She did not tolerate metformin  well with stomach ache.  I counseled her on taking the medication with meals but at this time she is opting to stop the medication.  She is on a low-dose of Abilify  so I do not anticipate drastic weight gain on the medication but will monitor for this potential adverse effect.  F/u in one month.   Plan:  # MDD, recurrent, moderate - Therapy with Elgie Crest - Decrease Paxil  to 45 mg daily for two weeks then decrease to 30 mg  - Start Zoloft  to 25 mg daily for two weeks then increase to 50 mg  - Continue Abilify  2 mg daily (was rxed 5 mg but had difficulty picking up medication)  - Labs from 02/2024 monitoring reviewed - D/c Metformin  500 mg BID due to stomach pain  # GAD - Change Gabapentin  300 mg TID to daily PRN - Continue Atarax  25 mg TID PRN  # Insomnia - Sleep study ordered - Trazodone  100 mg at bedtime nightly PRN  # Cannabis use disorder - Encourage cessation  Patient was given contact information for behavioral health clinic and was instructed to call 911 for emergencies.   Identifying Information: Shannon David is a 34 y.o. female with a history of MDD who presents in person to [facility] for mood.    Subjective:  Patient seen alone.  Patient reports feeling good today. Since the previous visit, she states that she has not increased the  Abilify  due to having difficulty picking up the medication. She feels she has not been worsening even with not increasing the dose.   Regarding psychiatric symptoms, she feels stable even if stressors like her ongoing divorce and applying disability. She states being around people has helped with keeping her her mood up.  She denies current adverse effects on Paxil  but she  is wanting to switch because she is concerned about future adverse effects.   Patient reports fair sleep, reporting dreaming about her ex and sleeping 7-8 hours. Patient reports fair appetite, reporting once a meal with snacks and trying to stay hydrated.   Patient denies current SI, HI, and AVH.   Substance use: Cannabis- three times a day- 3 blunts for anxiety.  Discussed TIPP pneumonic for distress tolerance and she plans on incorporating some of the new techniques when she is highly stressed.  Past Psychiatric History: Current psychiatrist: Previously Dr. Rainelle, saw in 2024 Current therapist: Seeing regularly for about a year- Elgie Friedlander- seeing every 3 weeks Previous psychiatric diagnoses: MDD, GAD Current psychiatric medications: Paxil , gabapentin , propranolol , trazodone , hydroxyzine , and Abilify   Previous medication: Lexapro (ineffective 3 months), Cymbalta (ineffective 3 months)  Psychiatric medication history/compliance: Reports adherence, father confirms Psychiatric hospitalization(s): Adjustment disorder ISO NSSI (scratching arm with scissors) 04/21/23-10/29. [facility] in from 9/16-9/20 due for suicidal gesture (took 8 gabapentin  and 1 abilify ).   Neuromodulation history: Denies History of suicide (obtained from HPI): Reports no history of suicide attempts, borne out in chart review History of homicide or aggression (obtained in HPI): None known SIB: yes, first time was in 2024 and most recently 02/2024  Substance Abuse History: Alcohol: Occasionally, one wine cooler about 2 a month Tobacco: Denies Cannabis: Twice a day (2 blunts daily) with the intention to aid with anxiety, using since February IV drug use: Denies Prescription drug use: Denies Other illicit drugs: Denies Rehab history: Denies   Past Medical History: PCP: Dr. Nestor Medical diagnoses: RA, hypothyroidism, IBS, PCOS, HTN Medications: Hydroxychloroquine , methotrexate  1/week, losartan , levothyroxine  112 mcg  qday, vitamin d3, breo elliptaalbuterol, propranolol  20 mg BID, Trazodone  100 mg, gabapentin  300 mg BID Allergies: Keflex (hives)  Hospitalizations: Denies recently Surgeries: Foot surgeries Trauma: Denies Seizures: ?PNES vs Seizure activity over the last year, neuro workup negative, did not make followup   Social History: Living situation: With dad Education: Some college Occupational history: applying for disability Marital status: Single Children: Denies Legal: Denies Military: None   Access to firearms: Patient and family denies   Family Psychiatric History: Psychiatric diagnoses: Mother and brother- bipolar Suicide history: Denies Violence/aggression: Denies Substance use history: Denies  Past Medical History:  Past Medical History:  Diagnosis Date   Anxiety    COVID    History of Clostridium difficile infection 01/2023   History of hypothyroidism    History of PCOS    Major depressive disorder, recurrent severe without psychotic features (HCC)    MDD (major depressive disorder), recurrent episode, moderate (HCC) 04/21/2023   Pt presented for self harm by cutting wrist at work     Passive suicidal ideations 11/11/2022   Rheumatoid arthritis (HCC)    Severe recurrent major depression without psychotic features (HCC) 05/06/2023   Suicidal behavior with attempted self-injury (HCC) 04/22/2023    Past Surgical History:  Procedure Laterality Date   BUNIONECTOMY Right    FOOT SURGERY Right 08/24/2021    Family History:  Family History  Problem Relation Age of Onset   Depression Mother    Bipolar disorder Mother  Hypertension Mother    Clotting disorder Mother    Depression Brother    Rheum arthritis Brother    Lung cancer Paternal Uncle    Cancer Maternal Grandmother        lung    Social History   Socioeconomic History   Marital status: Legally Separated    Spouse name: Not on file   Number of children: 0   Years of education: Not on file   Highest  education level: High school graduate  Occupational History   Not on file  Tobacco Use   Smoking status: Never    Passive exposure: Current   Smokeless tobacco: Never  Vaping Use   Vaping status: Never Used  Substance and Sexual Activity   Alcohol use: No   Drug use: No   Sexual activity: Not on file  Other Topics Concern   Not on file  Social History Narrative   Left Handed    Lives in a one story home   Drinks Caffeine    Social Drivers of Health   Tobacco Use: Low Risk (06/18/2024)   Received from [facility]   Patient History    Smoking Tobacco Use: Never    Smokeless Tobacco Use: Never    Passive Exposure: Not on file  Recent Concern: Tobacco Use - Medium Risk (06/15/2024)   Patient History    Smoking Tobacco Use: Never    Smokeless Tobacco Use: Never    Passive Exposure: Current  Financial Resource Strain: Not on file  Food Insecurity: Low Risk (06/18/2024)   Received from [facility]   Epic    Within the past 12 months, you worried that your food would run out before you got money to buy more: Never true    Within the past 12 months, the food you bought just didn't last and you didn't have money to get more. : Never true  Transportation Needs: No Transportation Needs (06/18/2024)   Received from [facility]    In the past 12 months, has lack of reliable transportation kept you from medical appointments, meetings, work or from getting things needed for daily living? : No  Physical Activity: Not on file  Stress: Not on file  Social Connections: Not on file  Depression (PHQ2-9): Low Risk (08/17/2023)   Depression (PHQ2-9)    PHQ-2 Score: 0  Recent Concern: Depression (PHQ2-9) - High Risk (06/02/2023)   Depression (PHQ2-9)    PHQ-2 Score: 18  Alcohol Screen: Low Risk (03/13/2024)   Alcohol Screen    Last Alcohol Screening Score (AUDIT): 1  Housing: Low Risk (06/18/2024)   Received from [facility]   Epic    What is your living  situation today?: I have a steady place to live    Think about the place you live. Do you have problems with any of the following? Choose all that apply:: None/None on this list  Utilities: Low Risk (06/18/2024)   Received from [facility]   Utilities    In the past 12 months has the electric, gas, oil, or water company threatened to shut off services in your home? : No  Health Literacy: Not on file    Allergies:  Allergies  Allergen Reactions   Cephalexin Hives    Keflex    Current Medications: Current Outpatient Medications  Medication Sig Dispense Refill   ARIPiprazole  (ABILIFY ) 2 MG tablet Take 1 tablet (2 mg total) by mouth daily. 30 tablet 1   sertraline  (ZOLOFT ) 25  MG tablet Take 1 tablet (25 mg total) by mouth daily for 14 days, THEN 2 tablets (50 mg total) daily for 28 days. 70 tablet 0   albuterol  (VENTOLIN  HFA) 108 (90 Base) MCG/ACT inhaler Inhale 1-2 puffs into the lungs every 6 (six) hours as needed for wheezing or shortness of breath. 1 each 0   ALPRAZolam  (XANAX ) 0.5 MG tablet Take 1 tablet (0.5 mg total) by mouth at bedtime as needed (for sleep study premedication.). 3 tablet 0   budesonide -formoterol  (BREYNA ) 160-4.5 MCG/ACT inhaler Inhale 2 puffs into the lungs in the morning and at bedtime. 1 each 12   fluticasone  furoate-vilanterol (BREO ELLIPTA ) 100-25 MCG/ACT AEPB Inhale 1 puff into the lungs daily. 60 each 3   folic acid  (FOLVITE ) 1 MG tablet Take 2 mg by mouth daily.     gabapentin  (NEURONTIN ) 300 MG capsule Take 1 capsule (300 mg total) by mouth daily as needed. 60 capsule 0   hydroxychloroquine  (PLAQUENIL ) 200 MG tablet Take 1 tablet (200 mg total) by mouth 2 (two) times daily. 60 tablet 0   hydrOXYzine  (ATARAX ) 25 MG tablet Take 1 tablet (25 mg total) by mouth 3 (three) times daily as needed for anxiety. 30 tablet 2   levothyroxine  (SYNTHROID ) 112 MCG tablet Take 1 tablet (112 mcg total) by mouth daily. 30 tablet 0   losartan  (COZAAR ) 25 MG tablet Take 25 mg  by mouth daily.     methotrexate  (RHEUMATREX) 2.5 MG tablet TAKE 8 TABLETS (20 MG TOTAL) BY MOUTH ONCE A WEEK. CAUTION:CHEMOTHERAPY. PROTECT FROM LIGHT. 96 tablet 0   ondansetron  (ZOFRAN ) 4 MG tablet Take 4 mg by mouth every 8 (eight) hours as needed.     PARoxetine  (PAXIL ) 30 MG tablet Take 1.5 tablets (45 mg total) by mouth daily for 14 days, THEN 1 tablet (30 mg total) daily for 28 days. 49 tablet 0   Tocilizumab  (ACTEMRA ) 80 MG/4ML SOLN injection INFUSE 4MG /KG INTRAVENOUSLY EVERY 4 WEEKS (Body weight 123.9kg) 28 mL 5   traZODone  (DESYREL ) 100 MG tablet Take 1 tablet (100 mg total) by mouth at bedtime as needed. for sleep 30 tablet 1   No current facility-administered medications for this visit.    Objective:  Psychiatric Specialty Exam: General Appearance: appears at stated age  Behavior: pleasant and cooperative   Psychomotor Activity: no psychomotor agitation or retardation noted   Eye Contact: fair  Speech: normal amount, volume and fluency    Mood: euthymic  Affect: congruent, pleasant and interactive   Thought Process: linear, goal directed, no circumstantial or tangential thought process noted, no racing thoughts or flight of ideas  Descriptions of Associations: intact   Thought Content Hallucinations: denies AH, VH , does not appear responding to stimuli  Delusions: no paranoia, delusions of control, grandeur, ideas of reference, thought broadcasting, and magical thinking  Suicidal Thoughts: denies SI, intention, plan  Homicidal Thoughts: denies HI, intention, plan   Alertness/Orientation: alert and fully oriented   Insight: fair Judgment: fair  Memory: intact   Executive Functions  Concentration: intact  Attention Span: fair  Recall: intact  Fund of Knowledge: fair   Physical Exam  General: Pleasant, well-appearing. No acute distress. Pulmonary: Normal effort. No wheezing or rales. Neuro: A&Ox3.No focal deficit.  Review of Systems  No reported  symptoms   Metabolic Disorder Labs: Lab Results  Component Value Date   HGBA1C 4.5 (L) 03/15/2024   MPG 82.45 03/15/2024   No results found for: PROLACTIN Lab Results  Component Value Date  CHOL 178 01/23/2024   TRIG 91 01/23/2024   HDL 48 (L) 01/23/2024   CHOLHDL 3.7 01/23/2024   VLDL 80.1 07/21/2023   LDLCALC 111 (H) 01/23/2024   LDLCALC 95 07/21/2023   Lab Results  Component Value Date   TSH 5.563 (H) 03/12/2024    Therapeutic Level Labs: No results found for: LITHIUM No results found for: CBMZ No results found for: VALPROATE  Screenings:  AUDIT    Flowsheet Row [facility] Ad[facility] (Dis[facility]  Alcohol Use Disorder Identification Test Final Score (AUDIT) 1 0   GAD-7    Flowsheet Row Counselor from 08/17/2023 in [facility] from 12/23/2022 in [facility]  Total GAD-7 Score 4 2   PHQ2-9    Flowsheet Row Counselor from 08/17/2023 in [facility] from 06/02/2023 in [facility] Clinical Support from 05/25/2023 in [facility] Counselor from 05/06/2023 in [facility] PARTIAL HOSPITALIZATION PROGRAM Counselor from 04/28/2023 in [facility] PROGRAM  PHQ-2 Total Score 0 3 3 2 2   PHQ-9 Total Score -- 18 14 13 15    Flowsheet Row ED from 06/15/2024 in [facility] IV Medication 120 from 05/23/2024 in [facility] Admission (Discharged) [facility]  C-SSRS RISK CATEGORY No Risk No Risk Low Risk    Collaboration of Care: Case discussed with attending, see attending's  attestation for additional information.  Ismael Franco, MD PGY-3 Psychiatry Resident  "

## 2024-07-02 ENCOUNTER — Telehealth (HOSPITAL_BASED_OUTPATIENT_CLINIC_OR_DEPARTMENT_OTHER): Admitting: Psychiatry

## 2024-07-02 DIAGNOSIS — F411 Generalized anxiety disorder: Secondary | ICD-10-CM

## 2024-07-02 DIAGNOSIS — G4700 Insomnia, unspecified: Secondary | ICD-10-CM

## 2024-07-02 DIAGNOSIS — F322 Major depressive disorder, single episode, severe without psychotic features: Secondary | ICD-10-CM

## 2024-07-02 MED ORDER — PAROXETINE HCL 30 MG PO TABS: ORAL_TABLET | ORAL | 0 refills | Status: AC

## 2024-07-02 MED ORDER — ARIPIPRAZOLE 2 MG PO TABS: 2.0000 mg | ORAL_TABLET | Freq: Every day | ORAL | 1 refills | Status: AC

## 2024-07-02 MED ORDER — HYDROXYZINE HCL 25 MG PO TABS: 25.0000 mg | ORAL_TABLET | Freq: Three times a day (TID) | ORAL | 2 refills | Status: AC | PRN

## 2024-07-02 MED ORDER — TRAZODONE HCL 100 MG PO TABS: 100.0000 mg | ORAL_TABLET | Freq: Every evening | ORAL | 1 refills | Status: AC | PRN

## 2024-07-02 MED ORDER — GABAPENTIN 300 MG PO CAPS: 300.0000 mg | ORAL_CAPSULE | Freq: Every day | ORAL | 0 refills | Status: AC | PRN

## 2024-07-02 MED ORDER — SERTRALINE HCL 25 MG PO TABS: ORAL_TABLET | ORAL | 0 refills | Status: AC

## 2024-07-02 NOTE — Patient Instructions (Signed)
 PAXIL  decrease instructions: - decrease paxil  to 45 mg for 2 weeks -> decrease paxil  to 30 mg and maintain until next appointment  ZOLOFT  starting instructions - start zoloft  25 mg for 2 weeks -> increase zoloft  to 50 mg and maintain until next appointment

## 2024-07-03 NOTE — Telephone Encounter (Signed)
 I called and spoke with the patient.  Split MCD Shannon David: [id number] (exp. 05/17/24 to 08/15/24) * pt aware to bring her Xanax    She is scheduled at [facility] for 07/19/2024 at 9 pm.  Mailed packet and sent mychart

## 2024-07-06 ENCOUNTER — Telehealth (HOSPITAL_COMMUNITY): Payer: Self-pay

## 2024-07-06 NOTE — Telephone Encounter (Signed)
 Auth Submission: NO AUTH NEEDED Site of care: Site of care: [facility] Payer: [place] [employer] Medication & CPT/J Code(s) submitted: Actemra  (Tocilizumab ) [id number]) Diagnosis Code: M05.79 Route of submission (phone, fax, portal):  Phone # Fax # Auth type: Buy/Bill HB Units/visits requested: 800mg  q4weeks Reference number:  Approval from: 06/28/24 to 01/25/25

## 2024-07-09 NOTE — Progress Notes (Unsigned)
 "  Office Visit Note  Patient: Shannon David             Date of Birth: February 17, 1991           MRN: [id number]             PCP: Nestor Elston NOVAK, NP Referring: Nestor Elston NOVAK, NP Visit Date: 07/23/2024 Occupation: Data Unavailable  Subjective:  No chief complaint on file.   History of Present Illness: Shannon David is a 34 y.o. female ***     Activities of Daily Living:  Patient reports morning stiffness for *** {minute/hour:19697}.   Patient {ACTIONS;DENIES/REPORTS:21021675::Denies} nocturnal pain.  Difficulty dressing/grooming: {ACTIONS;DENIES/REPORTS:21021675::Denies} Difficulty climbing stairs: {ACTIONS;DENIES/REPORTS:21021675::Denies} Difficulty getting out of chair: {ACTIONS;DENIES/REPORTS:21021675::Denies} Difficulty using hands for taps, buttons, cutlery, and/or writing: {ACTIONS;DENIES/REPORTS:21021675::Denies}  No Rheumatology ROS completed.   PMFS History:  Patient Active Problem List   Diagnosis Date Noted   Other chronic pain 05/08/2024   Screening for tuberculosis 04/26/2024   Insomnia 04/04/2024   Drug-induced weight gain 04/04/2024   MDD (major depressive disorder), severe (HCC) 03/13/2024   GAD (generalized anxiety disorder) 04/22/2023   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   Pain in left knee 06/04/2021   History of PCOS 02/21/2017   High risk medication use 09/21/2016   Rheumatoid arthritis involving multiple sites with positive rheumatoid factor (HCC) 09/21/2016   History of hypothyroidism 09/21/2016    Past Medical History:  Diagnosis Date   Anxiety    COVID    History of Clostridium difficile infection 01/2023   History of hypothyroidism    History of PCOS    Major depressive disorder, recurrent severe without psychotic features (HCC)    MDD (major depressive disorder), recurrent episode, moderate (HCC) 04/21/2023   Pt presented for self harm by cutting wrist at work     Passive suicidal ideations 11/11/2022   Rheumatoid  arthritis (HCC)    Severe recurrent major depression without psychotic features (HCC) 05/06/2023   Suicidal behavior with attempted self-injury (HCC) 04/22/2023    Family History  Problem Relation Age of Onset   Depression Mother    Bipolar disorder Mother    Hypertension Mother    Clotting disorder Mother    Depression Brother    Rheum arthritis Brother    Lung cancer Paternal Uncle    Cancer Maternal Grandmother        lung   Past Surgical History:  Procedure Laterality Date   BUNIONECTOMY Right    FOOT SURGERY Right 08/24/2021   Social History[1] Social History   Social History Narrative   Left Handed    Lives in a one story home   Drinks Caffeine      Immunization History  Administered Date(s) Administered   Influenza-Unspecified 05/12/2022   PFIZER(Purple Top)SARS-COV-2 Vaccination 10/23/2021   Zoster Recombinant(Shingrix ) 04/05/2022     Objective: Vital Signs: There were no vitals taken for this visit.   Physical Exam   Musculoskeletal Exam: ***  CDAI Exam: CDAI Score: -- Patient Global: --; Provider Global: -- Swollen: --; Tender: -- Joint Exam 07/23/2024   No joint exam has been documented for this visit   There is currently no information documented on the homunculus. Go to the Rheumatology activity and complete the homunculus joint exam.  Investigation: No additional findings.  Imaging: No results found.  Recent Labs: Lab Results  Component Value Date   WBC 5.6 06/22/2024   HGB 14.2 06/22/2024   PLT 197 06/22/2024   NA 135 06/22/2024   K  3.9 06/22/2024   CL 103 06/22/2024   CO2 22 06/22/2024   GLUCOSE 90 06/22/2024   BUN 12 06/22/2024   CREATININE 0.71 06/22/2024   BILITOT 0.5 06/22/2024   ALKPHOS 68 06/22/2024   AST 24 06/22/2024   ALT 35 06/22/2024   PROT 6.8 06/22/2024   ALBUMIN 4.3 06/22/2024   CALCIUM 8.7 (L) 06/22/2024   GFRAA 134 07/18/2020   QFTBGOLD Negative 08/13/2016   QFTBGOLDPLUS NEGATIVE 10/24/2023     Speciality Comments: PLQ Eye Exam: 10/19/2022 WNL @ [facility] Follow up in 12 months.   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  No orders of the defined types were placed in this encounter.   Face-to-face time spent with patient was *** minutes. Greater than 50% of time was spent in counseling and coordination of care.  Follow-Up Instructions: No follow-ups on file.   Waddell CHRISTELLA Craze, PA-C  Note - This record has been created using Dragon software.  Chart creation errors have been sought, but may not always  have been located. Such creation errors do not reflect on  the standard of medical care.     [1]  Social History Tobacco Use   Smoking status: Never    Passive exposure: Current   Smokeless tobacco: Never  Vaping Use   Vaping status: Never Used  Substance Use Topics   Alcohol use: No   Drug use: No   "

## 2024-07-19 ENCOUNTER — Ambulatory Visit: Admitting: Neurology

## 2024-07-19 DIAGNOSIS — F3341 Major depressive disorder, recurrent, in partial remission: Secondary | ICD-10-CM

## 2024-07-19 DIAGNOSIS — G8929 Other chronic pain: Secondary | ICD-10-CM

## 2024-07-19 DIAGNOSIS — G478 Other sleep disorders: Secondary | ICD-10-CM | POA: Diagnosis not present

## 2024-07-19 DIAGNOSIS — F322 Major depressive disorder, single episode, severe without psychotic features: Secondary | ICD-10-CM

## 2024-07-19 DIAGNOSIS — T50905A Adverse effect of unspecified drugs, medicaments and biological substances, initial encounter: Secondary | ICD-10-CM

## 2024-07-19 DIAGNOSIS — M0579 Rheumatoid arthritis with rheumatoid factor of multiple sites without organ or systems involvement: Secondary | ICD-10-CM

## 2024-07-19 DIAGNOSIS — Z79899 Other long term (current) drug therapy: Secondary | ICD-10-CM

## 2024-07-19 DIAGNOSIS — R635 Abnormal weight gain: Secondary | ICD-10-CM

## 2024-07-19 DIAGNOSIS — F5104 Psychophysiologic insomnia: Secondary | ICD-10-CM

## 2024-07-20 ENCOUNTER — Encounter (HOSPITAL_COMMUNITY): Payer: Self-pay

## 2024-07-20 ENCOUNTER — Ambulatory Visit (HOSPITAL_COMMUNITY): Admitting: Clinical

## 2024-07-20 ENCOUNTER — Encounter (HOSPITAL_COMMUNITY)

## 2024-07-20 NOTE — Progress Notes (Signed)
 [name]    CSW attempted to connect with patient for scheduled appointment via MyChart video text request x 2 and email request with no response; also attempted to connect via phone without success. CSW left message for patient to call office to reschedule therapy appointment.        Attempt 1: Text and email: 10:02am      Attempt 2: Text: 10:04am       Attempt 3: Phone call and HIPAA-compliant voicemail: 10:12am      Left video chat open until:  10:21am      Per [place] policy, after multiple attempts to reach patient unsuccessfully at appointed time, visit will be coded as a no show.    Encounter Diagnosis  Name Primary?   No-show for appointment Yes       Elgie Crest, LCSW 07/20/2024, 10:21 AM

## 2024-07-23 ENCOUNTER — Ambulatory Visit: Admitting: Physician Assistant

## 2024-07-23 DIAGNOSIS — M170 Bilateral primary osteoarthritis of knee: Secondary | ICD-10-CM

## 2024-07-23 DIAGNOSIS — Z8639 Personal history of other endocrine, nutritional and metabolic disease: Secondary | ICD-10-CM

## 2024-07-23 DIAGNOSIS — M21942 Unspecified acquired deformity of hand, left hand: Secondary | ICD-10-CM

## 2024-07-23 DIAGNOSIS — M069 Rheumatoid arthritis, unspecified: Secondary | ICD-10-CM

## 2024-07-23 DIAGNOSIS — M0579 Rheumatoid arthritis with rheumatoid factor of multiple sites without organ or systems involvement: Secondary | ICD-10-CM

## 2024-07-23 DIAGNOSIS — M65341 Trigger finger, right ring finger: Secondary | ICD-10-CM

## 2024-07-23 DIAGNOSIS — G8929 Other chronic pain: Secondary | ICD-10-CM

## 2024-07-23 DIAGNOSIS — Z8701 Personal history of pneumonia (recurrent): Secondary | ICD-10-CM

## 2024-07-23 DIAGNOSIS — Z79899 Other long term (current) drug therapy: Secondary | ICD-10-CM

## 2024-07-23 DIAGNOSIS — Z8742 Personal history of other diseases of the female genital tract: Secondary | ICD-10-CM

## 2024-07-23 DIAGNOSIS — Z8659 Personal history of other mental and behavioral disorders: Secondary | ICD-10-CM

## 2024-07-23 DIAGNOSIS — R0989 Other specified symptoms and signs involving the circulatory and respiratory systems: Secondary | ICD-10-CM

## 2024-07-24 NOTE — Progress Notes (Unsigned)
 " Psychiatric Adult Assessment Progress Note Televisit via video: I connected with Shannon David on 2/9 at 11:00 AM EST by a video enabled telemedicine application and verified that I am speaking with the correct person using two identifiers.  Location: Patient: home*** Provider: office   I discussed the limitations of evaluation and management by telemedicine and the availability of in person appointments. The patient expressed understanding and agreed to proceed.  I discussed the assessment and treatment plan with the patient. The patient was provided an opportunity to ask questions and all were answered. The patient agreed with the plan and demonstrated an understanding of the instructions.   The patient was advised to call back or seek an in-person evaluation if the symptoms worsen or if the condition fails to improve as anticipated.   Patient Identification: Shannon David MRN:  [id number] Date of Evaluation:  07/24/2024  Assessment: Patient presents for a follow-up appointment. In the prior appointment, we started the cross titration from Paxil  to Zoloft  due to wanting to limit the possible adverse effects of Paxil .   Today, ***.  Plan:  # MDD, recurrent, moderate - Decrease Paxil  to 45 mg daily for two weeks then decrease to 30 mg  - Start Zoloft  to 25 mg daily for two weeks then increase to 50 mg  - Continue Abilify  2 mg daily (was rxed 5 mg but had difficulty picking up medication)  - Labs from 02/2024 monitoring reviewed - D/ced Metformin  500 mg BID due to stomach pain - Therapy with Mareida Grossman-Orr  # GAD - Zoloft  as above - Change Gabapentin  300 mg TID to daily PRN - Continue Atarax  25 mg TID PRN  # Insomnia - Sleep study ordered - Trazodone  100 mg at bedtime nightly PRN  # Cannabis use disorder - Encourage cessation  Patient was given contact information for behavioral health clinic and was instructed to call 911 for emergencies.   Identifying  Information: Shannon David is a 34 y.o. female with a history of MDD who presents in person to [facility] for mood.    Subjective:  Patient seen ***.  Patient reports feeling *** today. Regarding psychiatric symptoms, ***. Patient reports the medications are ***. Patient reports the following adverse effects: ***.  Patient reports *** sleep, ***. Patient reports *** appetite, ***.   Since the previous visit, ***. Stressors include ***.   Patient denies current SI, HI, and AVH. ***  Substance use: Cannabis- three times a day- 3 blunts for anxiety.  ***  Past Psychiatric History: Current psychiatrist: Previously Dr. Rainelle, saw in 2024 Current therapist: Seeing regularly for about a year- Shannon David- seeing every 3 weeks Previous psychiatric diagnoses: MDD, GAD Current psychiatric medications: Paxil , gabapentin , propranolol , trazodone , hydroxyzine , and Abilify   Previous medication: Lexapro (ineffective 3 months), Cymbalta (ineffective 3 months)  Psychiatric medication history/compliance: Reports adherence, father confirms Psychiatric hospitalization(s): Adjustment disorder ISO NSSI (scratching arm with scissors) 04/21/23-10/29. [facility] in from 9/16-9/20 due for suicidal gesture (took 8 gabapentin  and 1 abilify ).   Neuromodulation history: Denies History of suicide (obtained from HPI): Reports no history of suicide attempts, borne out in chart review History of homicide or aggression (obtained in HPI): None known SIB: yes, first time was in 2024 and most recently 02/2024  Substance Abuse History: Alcohol: Occasionally, one wine cooler about 2 a month Tobacco: Denies Cannabis: Twice a day (2 blunts daily) with the intention to aid with anxiety, using since February 2025 Prescription drug use: Denies Other illicit drugs: Denies  Rehab history: Denies   Past Medical History: PCP: Dr. Nestor Medical diagnoses: RA, hypothyroidism, IBS, PCOS, HTN Medications:  Hydroxychloroquine , methotrexate  1/week, losartan , levothyroxine  112 mcg qday, vitamin d3, breo elliptaalbuterol, propranolol  20 mg BID, Trazodone  100 mg, gabapentin  300 mg BID Allergies: Keflex (hives)  Hospitalizations: Denies recently Surgeries: Foot surgeries Trauma: Denies Seizures: ?PNES vs Seizure activity over the last year, neuro workup negative, did not make followup   Social History: Living situation: With dad Education: Some college Occupational history: applying for disability Marital status: Single Children: Denies Legal: Denies Military: None   Access to firearms: Patient and family denies   Family Psychiatric History: Psychiatric diagnoses: Mother and brother- bipolar Suicide history: Denies Violence/aggression: Denies Substance use history: Denies  Past Medical History:  Past Medical History:  Diagnosis Date   Anxiety    COVID    History of Clostridium difficile infection 01/2023   History of hypothyroidism    History of PCOS    Major depressive disorder, recurrent severe without psychotic features (HCC)    MDD (major depressive disorder), recurrent episode, moderate (HCC) 04/21/2023   Pt presented for self harm by cutting wrist at work     Passive suicidal ideations 11/11/2022   Rheumatoid arthritis (HCC)    Severe recurrent major depression without psychotic features (HCC) 05/06/2023   Suicidal behavior with attempted self-injury (HCC) 04/22/2023    Past Surgical History:  Procedure Laterality Date   BUNIONECTOMY Right    FOOT SURGERY Right 08/24/2021    Family History:  Family History  Problem Relation Age of Onset   Depression Mother    Bipolar disorder Mother    Hypertension Mother    Clotting disorder Mother    Depression Brother    Rheum arthritis Brother    Lung cancer Paternal Uncle    Cancer Maternal Grandmother        lung    Social History   Socioeconomic History   Marital status: Legally Separated    Spouse name: Not on  file   Number of children: 0   Years of education: Not on file   Highest education level: High school graduate  Occupational History   Not on file  Tobacco Use   Smoking status: Never    Passive exposure: Current   Smokeless tobacco: Never  Vaping Use   Vaping status: Never Used  Substance and Sexual Activity   Alcohol use: No   Drug use: No   Sexual activity: Not on file  Other Topics Concern   Not on file  Social History Narrative   Left Handed    Lives in a one story home   Drinks Caffeine    Social Drivers of Health   Tobacco Use: Low Risk (06/18/2024)   Received from [facility]   Patient History    Smoking Tobacco Use: Never    Smokeless Tobacco Use: Never    Passive Exposure: Not on file  Recent Concern: Tobacco Use - Medium Risk (06/15/2024)   Patient History    Smoking Tobacco Use: Never    Smokeless Tobacco Use: Never    Passive Exposure: Current  Financial Resource Strain: Not on file  Food Insecurity: Low Risk (06/18/2024)   Received from [facility]   Epic    Within the past 12 months, you worried that your food would run out before you got money to buy more: Never true    Within the past 12 months, the food you bought just didn't last and you didn't have  money to get more. : Never true  Transportation Needs: No Transportation Needs (06/18/2024)   [facility]   Transportation    In the past 12 months, has lack of reliable transportation kept you from medical appointments, meetings, work or from getting things needed for daily living? : No  Physical Activity: Not on file  Stress: Not on file  Social Connections: Not on file  Depression (PHQ2-9): Low Risk (08/17/2023)   Depression (PHQ2-9)    PHQ-2 Score: 0  Recent Concern: Depression (PHQ2-9) - High Risk (06/02/2023)   Depression (PHQ2-9)    PHQ-2 Score: 18  Alcohol Screen: Low Risk (03/13/2024)   Alcohol Screen    Last Alcohol Screening Score (AUDIT): 1  Housing: Low Risk  (06/18/2024)   Received from [facility]   Epic    What is your living situation today?: I have a steady place to live    Think about the place you live. Do you have problems with any of the following? Choose all that apply:: None/None on this list  Utilities: Low Risk (06/18/2024)   Received from [facility]   Utilities    In the past 12 months has the electric, gas, oil, or water company threatened to shut off services in your home? : No  Health Literacy: Not on file    Allergies:  Allergies  Allergen Reactions   Cephalexin Hives    Keflex    Current Medications: Current Outpatient Medications  Medication Sig Dispense Refill   albuterol  (VENTOLIN  HFA) 108 (90 Base) MCG/ACT inhaler Inhale 1-2 puffs into the lungs every 6 (six) hours as needed for wheezing or shortness of breath. 1 each 0   ALPRAZolam  (XANAX ) 0.5 MG tablet Take 1 tablet (0.5 mg total) by mouth at bedtime as needed (for sleep study premedication.). 3 tablet 0   ARIPiprazole  (ABILIFY ) 2 MG tablet Take 1 tablet (2 mg total) by mouth daily. 30 tablet 1   budesonide -formoterol  (BREYNA ) 160-4.5 MCG/ACT inhaler Inhale 2 puffs into the lungs in the morning and at bedtime. 1 each 12   fluticasone  furoate-vilanterol (BREO ELLIPTA ) 100-25 MCG/ACT AEPB Inhale 1 puff into the lungs daily. 60 each 3   folic acid  (FOLVITE ) 1 MG tablet Take 2 mg by mouth daily.     gabapentin  (NEURONTIN ) 300 MG capsule Take 1 capsule (300 mg total) by mouth daily as needed. 60 capsule 0   hydroxychloroquine  (PLAQUENIL ) 200 MG tablet Take 1 tablet (200 mg total) by mouth 2 (two) times daily. 60 tablet 0   hydrOXYzine  (ATARAX ) 25 MG tablet Take 1 tablet (25 mg total) by mouth 3 (three) times daily as needed for anxiety. 30 tablet 2   levothyroxine  (SYNTHROID ) 112 MCG tablet Take 1 tablet (112 mcg total) by mouth daily. 30 tablet 0   losartan  (COZAAR ) 25 MG tablet Take 25 mg by mouth daily.     methotrexate  (RHEUMATREX) 2.5 MG tablet TAKE 8 TABLETS  (20 MG TOTAL) BY MOUTH ONCE A WEEK. CAUTION:CHEMOTHERAPY. PROTECT FROM LIGHT. 96 tablet 0   ondansetron  (ZOFRAN ) 4 MG tablet Take 4 mg by mouth every 8 (eight) hours as needed.     PARoxetine  (PAXIL ) 30 MG tablet Take 1.5 tablets (45 mg total) by mouth daily for 14 days, THEN 1 tablet (30 mg total) daily for 28 days. 49 tablet 0   sertraline  (ZOLOFT ) 25 MG tablet Take 1 tablet (25 mg total) by mouth daily for 14 days, THEN 2 tablets (50 mg total) daily for 28 days.  70 tablet 0   Tocilizumab  (ACTEMRA ) 80 MG/4ML SOLN injection INFUSE 4MG /KG INTRAVENOUSLY EVERY 4 WEEKS (Body weight 123.9kg) 28 mL 5   traZODone  (DESYREL ) 100 MG tablet Take 1 tablet (100 mg total) by mouth at bedtime as needed. for sleep 30 tablet 1   No current facility-administered medications for this visit.    Objective:  Psychiatric Specialty Exam: General Appearance: appears at stated age  Behavior: pleasant and cooperative   Psychomotor Activity: no psychomotor agitation or retardation noted   Eye Contact: fair  Speech: normal amount, volume and fluency    Mood: euthymic  Affect: congruent, pleasant and interactive   Thought Process: linear, goal directed, no circumstantial or tangential thought process noted, no racing thoughts or flight of ideas  Descriptions of Associations: intact   Thought Content Hallucinations: denies AH, VH , does not appear responding to stimuli  Delusions: no paranoia, delusions of control, grandeur, ideas of reference, thought broadcasting, and magical thinking  Suicidal Thoughts: denies SI, intention, plan  Homicidal Thoughts: denies HI, intention, plan   Alertness/Orientation: alert and fully oriented   Insight: fair Judgment: fair  Memory: intact   Executive Functions  Concentration: intact  Attention Span: fair  Recall: intact  Fund of Knowledge: fair   Physical Exam  General: Pleasant, well-appearing. No acute distress. Pulmonary: Normal effort. No wheezing or  rales. Neuro: A&Ox3.No focal deficit.  Review of Systems  No reported symptoms   Metabolic Disorder Labs: Lab Results  Component Value Date   HGBA1C 4.5 (L) 03/15/2024   MPG 82.45 03/15/2024   No results found for: PROLACTIN Lab Results  Component Value Date   CHOL 178 01/23/2024   TRIG 91 01/23/2024   HDL 48 (L) 01/23/2024   CHOLHDL 3.7 01/23/2024   VLDL 80.1 07/21/2023   LDLCALC 111 (H) 01/23/2024   LDLCALC 95 07/21/2023   Lab Results  Component Value Date   TSH 5.563 (H) 03/12/2024    Therapeutic Level Labs: No results found for: LITHIUM No results found for: CBMZ No results found for: VALPROATE  Screenings:  AUDIT    Flowsheet Row [facility] (Dis[facility]  Alcohol Use Disorder Identification Test Final Score (AUDIT) 1 0   GAD-7    Flowsheet Row Counselor from 08/17/2023 in [facility] [place] Counselor from 12/23/2022 in [facility]  Total GAD-7 Score 4 2   PHQ2-9    Flowsheet Row Counselor from 08/17/2023 in [facility] from 06/02/2023 in [facility] Clinical Support from 05/25/2023 in [facility] Counselor from 05/06/2023 in BE[facility]IORAL [facility] PARTIAL HOSPITALIZATION PROGRAM Counselor from 04/28/2023 in [facility]PITAL[facility]ATION PROGRAM  PHQ-2 Total Score 0 3 3 2 2   PHQ-9 Total Score -- 18 14 13 15    Flowsheet Row ED from 06/15/2024 in [facility] at [facility] IV Medication 120 from 05/23/2024 in [facility] at [facility] Admission (Discharged) [facility]  C-SSRS RISK CATEGORY No Risk No Risk Low Risk     Collaboration of Care: Case discussed with attending, see attending's attestation for additional information.  Ismael Franco, MD PGY-3 Psychiatry Resident  "

## 2024-07-27 ENCOUNTER — Other Ambulatory Visit (HOSPITAL_COMMUNITY): Payer: Self-pay | Admitting: Psychiatry

## 2024-07-27 DIAGNOSIS — F411 Generalized anxiety disorder: Secondary | ICD-10-CM

## 2024-07-27 DIAGNOSIS — F322 Major depressive disorder, single episode, severe without psychotic features: Secondary | ICD-10-CM

## 2024-07-29 ENCOUNTER — Encounter (HOSPITAL_COMMUNITY): Payer: Self-pay | Admitting: Physician Assistant

## 2024-07-30 ENCOUNTER — Ambulatory Visit: Payer: Self-pay | Admitting: Neurology

## 2024-07-30 DIAGNOSIS — R635 Abnormal weight gain: Secondary | ICD-10-CM

## 2024-07-30 DIAGNOSIS — E662 Morbid (severe) obesity with alveolar hypoventilation: Secondary | ICD-10-CM

## 2024-07-30 DIAGNOSIS — G4733 Obstructive sleep apnea (adult) (pediatric): Secondary | ICD-10-CM

## 2024-07-31 ENCOUNTER — Inpatient Hospital Stay (HOSPITAL_COMMUNITY): Admission: RE | Admit: 2024-07-31 | Source: Ambulatory Visit

## 2024-08-01 NOTE — Progress Notes (Signed)
 Left message for patient to return call to discuss sleep study results.

## 2024-08-01 NOTE — Telephone Encounter (Signed)
 Spoke to patient and informed her of cpap results, as well as Dr. Lionell recommendations. Patient stated she was agreeable and wanted to proceed with the CPAP therapy. Patient was set up with [employer] as a DME. Patient was informed of the insurance requirements. Patient's initial appointment was scheduled with Lauraine Sinks, NP.

## 2024-08-01 NOTE — Telephone Encounter (Signed)
-----   Message from Dedra Gores, MD sent at 07/30/2024  4:49 PM EST ----- This study was a PSG, not SPLIT night. AHI was mild at 11.5/h and the patient had only hypopneas, not apneas. The patient never went into REM sleep.  The dx is mild OSA with hypoventilation. Weight loss is recommended .  CPAP therapy is recommended.  DME order written. Compliance to be explained.  RV needed after therapy starts,

## 2024-08-01 NOTE — Progress Notes (Signed)
 SABRA

## 2024-08-06 ENCOUNTER — Telehealth (HOSPITAL_COMMUNITY): Admitting: Psychiatry

## 2024-08-09 ENCOUNTER — Encounter (HOSPITAL_COMMUNITY)

## 2024-08-17 ENCOUNTER — Encounter (HOSPITAL_COMMUNITY)

## 2024-08-28 ENCOUNTER — Encounter (HOSPITAL_COMMUNITY)

## 2024-09-04 ENCOUNTER — Ambulatory Visit (HOSPITAL_COMMUNITY): Admitting: Clinical

## 2024-10-23 ENCOUNTER — Ambulatory Visit: Admitting: Neurology
